# Patient Record
Sex: Female | Born: 1945
Health system: Southern US, Community
[De-identification: ages and names within clinical notes are randomized; demographics above are authoritative.]

## PROBLEM LIST (undated history)

## (undated) DIAGNOSIS — Z8619 Personal history of other infectious and parasitic diseases: Secondary | ICD-10-CM

## (undated) DIAGNOSIS — B373 Candidiasis of vulva and vagina: Secondary | ICD-10-CM

## (undated) DIAGNOSIS — T8859XA Other complications of anesthesia, initial encounter: Secondary | ICD-10-CM

## (undated) DIAGNOSIS — K219 Gastro-esophageal reflux disease without esophagitis: Secondary | ICD-10-CM

## (undated) DIAGNOSIS — R011 Cardiac murmur, unspecified: Secondary | ICD-10-CM

## (undated) DIAGNOSIS — I10 Essential (primary) hypertension: Secondary | ICD-10-CM

## (undated) DIAGNOSIS — C50919 Malignant neoplasm of unspecified site of unspecified female breast: Secondary | ICD-10-CM

## (undated) DIAGNOSIS — Z889 Allergy status to unspecified drugs, medicaments and biological substances status: Secondary | ICD-10-CM

## (undated) DIAGNOSIS — J189 Pneumonia, unspecified organism: Secondary | ICD-10-CM

## (undated) DIAGNOSIS — B379 Candidiasis, unspecified: Secondary | ICD-10-CM

## (undated) DIAGNOSIS — IMO0002 Reserved for concepts with insufficient information to code with codable children: Secondary | ICD-10-CM

## (undated) DIAGNOSIS — I499 Cardiac arrhythmia, unspecified: Secondary | ICD-10-CM

## (undated) DIAGNOSIS — Z923 Personal history of irradiation: Secondary | ICD-10-CM

## (undated) DIAGNOSIS — D071 Carcinoma in situ of vulva: Secondary | ICD-10-CM

## (undated) DIAGNOSIS — Z8739 Personal history of other diseases of the musculoskeletal system and connective tissue: Secondary | ICD-10-CM

## (undated) DIAGNOSIS — K5790 Diverticulosis of intestine, part unspecified, without perforation or abscess without bleeding: Secondary | ICD-10-CM

## (undated) DIAGNOSIS — T7840XA Allergy, unspecified, initial encounter: Secondary | ICD-10-CM

## (undated) DIAGNOSIS — N83209 Unspecified ovarian cyst, unspecified side: Secondary | ICD-10-CM

## (undated) DIAGNOSIS — H269 Unspecified cataract: Secondary | ICD-10-CM

## (undated) DIAGNOSIS — M1990 Unspecified osteoarthritis, unspecified site: Secondary | ICD-10-CM

## (undated) DIAGNOSIS — Z86010 Personal history of colonic polyps: Secondary | ICD-10-CM

## (undated) DIAGNOSIS — N952 Postmenopausal atrophic vaginitis: Secondary | ICD-10-CM

## (undated) HISTORY — DX: Postmenopausal atrophic vaginitis: N95.2

## (undated) HISTORY — DX: Candidiasis, unspecified: B37.9

## (undated) HISTORY — PX: HERNIA REPAIR: SHX51

## (undated) HISTORY — DX: Reserved for concepts with insufficient information to code with codable children: IMO0002

## (undated) HISTORY — PX: COLONOSCOPY W/ BIOPSIES: SHX1374

## (undated) HISTORY — DX: Personal history of other diseases of the musculoskeletal system and connective tissue: Z87.39

## (undated) HISTORY — PX: KNEE SURGERY: SHX244

## (undated) HISTORY — PX: BREAST EXCISIONAL BIOPSY: SUR124

## (undated) HISTORY — DX: Allergy, unspecified, initial encounter: T78.40XA

## (undated) HISTORY — DX: Unspecified cataract: H26.9

## (undated) HISTORY — DX: Unspecified ovarian cyst, unspecified side: N83.209

## (undated) HISTORY — PX: COLONOSCOPY: SHX174

## (undated) HISTORY — DX: Diverticulosis of intestine, part unspecified, without perforation or abscess without bleeding: K57.90

## (undated) HISTORY — DX: Candidiasis of vulva and vagina: B37.3

## (undated) HISTORY — DX: Personal history of other infectious and parasitic diseases: Z86.19

## (undated) HISTORY — PX: ABDOMINAL HYSTERECTOMY: SHX81

## (undated) HISTORY — PX: BREAST SURGERY: SHX581

## (undated) HISTORY — DX: Malignant neoplasm of unspecified site of unspecified female breast: C50.919

## (undated) HISTORY — PX: ABDOMINAL SURGERY: SHX537

## (undated) HISTORY — DX: Unspecified osteoarthritis, unspecified site: M19.90

## (undated) HISTORY — DX: Carcinoma in situ of vulva: D07.1

## (undated) HISTORY — DX: Personal history of colonic polyps: Z86.010

## (undated) HISTORY — DX: Pneumonia, unspecified organism: J18.9

## (undated) HISTORY — PX: EYE SURGERY: SHX253

---

## 1988-01-24 DIAGNOSIS — Z923 Personal history of irradiation: Secondary | ICD-10-CM

## 1988-01-24 HISTORY — DX: Personal history of irradiation: Z92.3

## 1988-01-24 HISTORY — PX: BREAST LUMPECTOMY: SHX2

## 1997-01-23 DIAGNOSIS — C50919 Malignant neoplasm of unspecified site of unspecified female breast: Secondary | ICD-10-CM

## 1997-01-23 HISTORY — DX: Malignant neoplasm of unspecified site of unspecified female breast: C50.919

## 2003-08-03 ENCOUNTER — Encounter: Admission: RE | Admit: 2003-08-03 | Discharge: 2003-08-03 | Payer: Self-pay | Admitting: Oncology

## 2004-01-21 ENCOUNTER — Ambulatory Visit: Payer: Self-pay | Admitting: Oncology

## 2004-01-24 DIAGNOSIS — R87619 Unspecified abnormal cytological findings in specimens from cervix uteri: Secondary | ICD-10-CM

## 2004-01-24 DIAGNOSIS — IMO0002 Reserved for concepts with insufficient information to code with codable children: Secondary | ICD-10-CM

## 2004-01-24 HISTORY — DX: Reserved for concepts with insufficient information to code with codable children: IMO0002

## 2004-01-24 HISTORY — DX: Unspecified abnormal cytological findings in specimens from cervix uteri: R87.619

## 2004-05-26 ENCOUNTER — Encounter: Admission: RE | Admit: 2004-05-26 | Discharge: 2004-05-26 | Payer: Self-pay

## 2004-06-06 ENCOUNTER — Encounter: Admission: RE | Admit: 2004-06-06 | Discharge: 2004-06-06 | Payer: Self-pay

## 2004-07-29 ENCOUNTER — Ambulatory Visit: Payer: Self-pay | Admitting: Oncology

## 2004-08-18 ENCOUNTER — Encounter: Admission: RE | Admit: 2004-08-18 | Discharge: 2004-11-04 | Payer: Self-pay | Admitting: Neurosurgery

## 2004-11-17 ENCOUNTER — Encounter: Admission: RE | Admit: 2004-11-17 | Discharge: 2004-11-17 | Payer: Self-pay | Admitting: Neurosurgery

## 2005-02-03 ENCOUNTER — Ambulatory Visit: Payer: Self-pay | Admitting: Oncology

## 2005-06-23 DIAGNOSIS — N952 Postmenopausal atrophic vaginitis: Secondary | ICD-10-CM

## 2005-06-23 HISTORY — DX: Postmenopausal atrophic vaginitis: N95.2

## 2005-07-05 ENCOUNTER — Other Ambulatory Visit: Admission: RE | Admit: 2005-07-05 | Discharge: 2005-07-05 | Payer: Self-pay | Admitting: Obstetrics and Gynecology

## 2005-07-21 ENCOUNTER — Ambulatory Visit: Payer: Self-pay | Admitting: Oncology

## 2005-07-31 LAB — COMPREHENSIVE METABOLIC PANEL
Alkaline Phosphatase: 86 U/L (ref 39–117)
BUN: 14 mg/dL (ref 6–23)
Glucose, Bld: 94 mg/dL (ref 70–99)
Sodium: 140 mEq/L (ref 135–145)
Total Bilirubin: 0.5 mg/dL (ref 0.3–1.2)

## 2005-07-31 LAB — CBC WITH DIFFERENTIAL/PLATELET
Basophils Absolute: 0 10*3/uL (ref 0.0–0.1)
Eosinophils Absolute: 0.2 10*3/uL (ref 0.0–0.5)
LYMPH%: 30.9 % (ref 14.0–48.0)
MCV: 86 fL (ref 81.0–101.0)
MONO%: 8.3 % (ref 0.0–13.0)
NEUT#: 2.4 10*3/uL (ref 1.5–6.5)
Platelets: 286 10*3/uL (ref 145–400)
RBC: 4.54 10*6/uL (ref 3.70–5.32)

## 2005-08-01 ENCOUNTER — Encounter: Admission: RE | Admit: 2005-08-01 | Discharge: 2005-08-01 | Payer: Self-pay | Admitting: Oncology

## 2005-08-08 ENCOUNTER — Encounter: Admission: RE | Admit: 2005-08-08 | Discharge: 2005-08-08 | Payer: Self-pay | Admitting: Neurosurgery

## 2006-01-18 ENCOUNTER — Ambulatory Visit: Payer: Self-pay | Admitting: Oncology

## 2006-01-24 LAB — COMPREHENSIVE METABOLIC PANEL
Albumin: 4.2 g/dL (ref 3.5–5.2)
Alkaline Phosphatase: 93 U/L (ref 39–117)
BUN: 15 mg/dL (ref 6–23)
CO2: 29 mEq/L (ref 19–32)
Calcium: 9.1 mg/dL (ref 8.4–10.5)
Chloride: 103 mEq/L (ref 96–112)
Glucose, Bld: 84 mg/dL (ref 70–99)
Potassium: 3.7 mEq/L (ref 3.5–5.3)

## 2006-01-24 LAB — CANCER ANTIGEN 27.29: CA 27.29: 25 U/mL (ref 0–39)

## 2006-01-24 LAB — CBC WITH DIFFERENTIAL/PLATELET
Basophils Absolute: 0.1 10*3/uL (ref 0.0–0.1)
Eosinophils Absolute: 0.2 10*3/uL (ref 0.0–0.5)
HGB: 13.6 g/dL (ref 11.6–15.9)
MCV: 83.9 fL (ref 81.0–101.0)
MONO#: 0.4 10*3/uL (ref 0.1–0.9)
MONO%: 9.8 % (ref 0.0–13.0)
NEUT#: 1.5 10*3/uL (ref 1.5–6.5)
RDW: 12.4 % (ref 11.3–14.5)

## 2006-01-24 LAB — LACTATE DEHYDROGENASE: LDH: 146 U/L (ref 94–250)

## 2006-11-12 ENCOUNTER — Encounter: Admission: RE | Admit: 2006-11-12 | Discharge: 2007-01-23 | Payer: Self-pay | Admitting: Family Medicine

## 2007-01-22 ENCOUNTER — Ambulatory Visit: Payer: Self-pay | Admitting: Oncology

## 2007-01-25 LAB — CBC WITH DIFFERENTIAL/PLATELET
BASO%: 0.7 % (ref 0.0–2.0)
Basophils Absolute: 0 10*3/uL (ref 0.0–0.1)
EOS%: 3 % (ref 0.0–7.0)
HCT: 39.8 % (ref 34.8–46.6)
HGB: 13.4 g/dL (ref 11.6–15.9)
MCH: 28.5 pg (ref 26.0–34.0)
MCHC: 33.8 g/dL (ref 32.0–36.0)
MONO#: 0.3 10*3/uL (ref 0.1–0.9)
NEUT%: 58.1 % (ref 39.6–76.8)
RDW: 14.9 % — ABNORMAL HIGH (ref 11.3–14.5)
WBC: 4.9 10*3/uL (ref 3.9–10.0)
lymph#: 1.6 10*3/uL (ref 0.9–3.3)

## 2007-01-25 LAB — COMPREHENSIVE METABOLIC PANEL
ALT: 22 U/L (ref 0–35)
AST: 22 U/L (ref 0–37)
Albumin: 4 g/dL (ref 3.5–5.2)
CO2: 23 mEq/L (ref 19–32)
Calcium: 9.3 mg/dL (ref 8.4–10.5)
Chloride: 103 mEq/L (ref 96–112)
Potassium: 3.8 mEq/L (ref 3.5–5.3)

## 2007-01-25 LAB — LACTATE DEHYDROGENASE: LDH: 160 U/L (ref 94–250)

## 2007-02-19 ENCOUNTER — Encounter: Admission: RE | Admit: 2007-02-19 | Discharge: 2007-02-19 | Payer: Self-pay | Admitting: Specialist

## 2007-02-25 ENCOUNTER — Encounter: Admission: RE | Admit: 2007-02-25 | Discharge: 2007-02-25 | Payer: Self-pay | Admitting: Neurosurgery

## 2007-06-30 ENCOUNTER — Emergency Department (HOSPITAL_COMMUNITY): Admission: EM | Admit: 2007-06-30 | Discharge: 2007-06-30 | Payer: Self-pay | Admitting: Emergency Medicine

## 2007-07-24 DIAGNOSIS — B373 Candidiasis of vulva and vagina: Secondary | ICD-10-CM

## 2007-07-24 DIAGNOSIS — B3731 Acute candidiasis of vulva and vagina: Secondary | ICD-10-CM

## 2007-07-24 HISTORY — DX: Acute candidiasis of vulva and vagina: B37.31

## 2007-07-24 HISTORY — DX: Candidiasis of vulva and vagina: B37.3

## 2007-08-02 ENCOUNTER — Emergency Department (HOSPITAL_COMMUNITY): Admission: EM | Admit: 2007-08-02 | Discharge: 2007-08-02 | Payer: Self-pay | Admitting: Emergency Medicine

## 2007-08-07 ENCOUNTER — Encounter: Admission: RE | Admit: 2007-08-07 | Discharge: 2007-08-07 | Payer: Self-pay | Admitting: Oncology

## 2008-01-21 ENCOUNTER — Ambulatory Visit: Payer: Self-pay | Admitting: Oncology

## 2008-01-23 LAB — CBC WITH DIFFERENTIAL/PLATELET
BASO%: 1 % (ref 0.0–2.0)
Basophils Absolute: 0 10*3/uL (ref 0.0–0.1)
EOS%: 5.1 % (ref 0.0–7.0)
Eosinophils Absolute: 0.2 10*3/uL (ref 0.0–0.5)
HCT: 40.2 % (ref 34.8–46.6)
HGB: 13.5 g/dL (ref 11.6–15.9)
LYMPH%: 37.3 % (ref 14.0–48.0)
MCH: 29 pg (ref 26.0–34.0)
MCHC: 33.5 g/dL (ref 32.0–36.0)
MCV: 86.4 fL (ref 81.0–101.0)
MONO#: 0.3 10*3/uL (ref 0.1–0.9)
MONO%: 7.3 % (ref 0.0–13.0)
NEUT#: 2.2 10*3/uL (ref 1.5–6.5)
NEUT%: 49.3 % (ref 39.6–76.8)
Platelets: 228 10*3/uL (ref 145–400)
RBC: 4.65 10*6/uL (ref 3.70–5.32)
RDW: 15.1 % — ABNORMAL HIGH (ref 11.3–14.5)
WBC: 4.4 10*3/uL (ref 3.9–10.0)
lymph#: 1.6 10*3/uL (ref 0.9–3.3)

## 2008-01-27 LAB — COMPREHENSIVE METABOLIC PANEL
ALT: 17 U/L (ref 0–35)
AST: 18 U/L (ref 0–37)
Albumin: 3.9 g/dL (ref 3.5–5.2)
Alkaline Phosphatase: 76 U/L (ref 39–117)
BUN: 12 mg/dL (ref 6–23)
CO2: 26 mEq/L (ref 19–32)
Calcium: 9 mg/dL (ref 8.4–10.5)
Chloride: 106 mEq/L (ref 96–112)
Creatinine, Ser: 0.76 mg/dL (ref 0.40–1.20)
Glucose, Bld: 91 mg/dL (ref 70–99)
Potassium: 3.7 mEq/L (ref 3.5–5.3)
Sodium: 143 mEq/L (ref 135–145)
Total Bilirubin: 0.4 mg/dL (ref 0.3–1.2)
Total Protein: 6.9 g/dL (ref 6.0–8.3)

## 2008-01-27 LAB — LACTATE DEHYDROGENASE: LDH: 162 U/L (ref 94–250)

## 2008-02-07 ENCOUNTER — Encounter (INDEPENDENT_AMBULATORY_CARE_PROVIDER_SITE_OTHER): Payer: Self-pay | Admitting: Orthopedic Surgery

## 2008-02-07 ENCOUNTER — Ambulatory Visit (HOSPITAL_BASED_OUTPATIENT_CLINIC_OR_DEPARTMENT_OTHER): Admission: RE | Admit: 2008-02-07 | Discharge: 2008-02-07 | Payer: Self-pay | Admitting: Orthopedic Surgery

## 2008-02-27 ENCOUNTER — Encounter: Admission: RE | Admit: 2008-02-27 | Discharge: 2008-02-27 | Payer: Self-pay | Admitting: Neurosurgery

## 2008-09-24 ENCOUNTER — Encounter: Admission: RE | Admit: 2008-09-24 | Discharge: 2008-09-24 | Payer: Self-pay | Admitting: Oncology

## 2009-01-03 ENCOUNTER — Emergency Department (HOSPITAL_COMMUNITY): Admission: EM | Admit: 2009-01-03 | Discharge: 2009-01-03 | Payer: Self-pay | Admitting: Emergency Medicine

## 2009-01-19 ENCOUNTER — Ambulatory Visit: Payer: Self-pay | Admitting: Oncology

## 2009-01-25 LAB — CBC WITH DIFFERENTIAL/PLATELET
BASO%: 0.6 % (ref 0.0–2.0)
Basophils Absolute: 0 10e3/uL (ref 0.0–0.1)
EOS%: 4 % (ref 0.0–7.0)
Eosinophils Absolute: 0.2 10e3/uL (ref 0.0–0.5)
HCT: 39.2 % (ref 34.8–46.6)
HGB: 13.2 g/dL (ref 11.6–15.9)
LYMPH%: 33.8 % (ref 14.0–49.7)
MCH: 28.6 pg (ref 25.1–34.0)
MCHC: 33.6 g/dL (ref 31.5–36.0)
MCV: 85.1 fL (ref 79.5–101.0)
MONO#: 0.4 10e3/uL (ref 0.1–0.9)
MONO%: 8.1 % (ref 0.0–14.0)
NEUT#: 2.8 10e3/uL (ref 1.5–6.5)
NEUT%: 53.5 % (ref 38.4–76.8)
Platelets: 313 10e3/uL (ref 145–400)
RBC: 4.61 10e6/uL (ref 3.70–5.45)
RDW: 14.8 % — ABNORMAL HIGH (ref 11.2–14.5)
WBC: 5.2 10e3/uL (ref 3.9–10.3)
lymph#: 1.8 10e3/uL (ref 0.9–3.3)

## 2009-01-25 LAB — COMPREHENSIVE METABOLIC PANEL WITH GFR
ALT: 19 U/L (ref 0–35)
AST: 20 U/L (ref 0–37)
Albumin: 4.1 g/dL (ref 3.5–5.2)
Alkaline Phosphatase: 78 U/L (ref 39–117)
BUN: 13 mg/dL (ref 6–23)
CO2: 25 meq/L (ref 19–32)
Calcium: 8.8 mg/dL (ref 8.4–10.5)
Chloride: 102 meq/L (ref 96–112)
Creatinine, Ser: 0.69 mg/dL (ref 0.40–1.20)
Glucose, Bld: 86 mg/dL (ref 70–99)
Potassium: 3.3 meq/L — ABNORMAL LOW (ref 3.5–5.3)
Sodium: 139 meq/L (ref 135–145)
Total Bilirubin: 0.3 mg/dL (ref 0.3–1.2)
Total Protein: 6.9 g/dL (ref 6.0–8.3)

## 2009-01-25 LAB — VITAMIN D 25 HYDROXY (VIT D DEFICIENCY, FRACTURES): Vit D, 25-Hydroxy: 48 ng/mL (ref 30–89)

## 2009-01-25 LAB — LACTATE DEHYDROGENASE: LDH: 168 U/L (ref 94–250)

## 2009-03-03 ENCOUNTER — Encounter: Admission: RE | Admit: 2009-03-03 | Discharge: 2009-03-03 | Payer: Self-pay | Admitting: Neurosurgery

## 2009-03-23 DIAGNOSIS — D071 Carcinoma in situ of vulva: Secondary | ICD-10-CM

## 2009-03-23 HISTORY — DX: Carcinoma in situ of vulva: D07.1

## 2009-04-01 ENCOUNTER — Ambulatory Visit (HOSPITAL_COMMUNITY): Admission: RE | Admit: 2009-04-01 | Discharge: 2009-04-01 | Payer: Self-pay | Admitting: Obstetrics and Gynecology

## 2009-04-12 DIAGNOSIS — D071 Carcinoma in situ of vulva: Secondary | ICD-10-CM | POA: Insufficient documentation

## 2009-09-28 ENCOUNTER — Encounter: Admission: RE | Admit: 2009-09-28 | Discharge: 2009-09-28 | Payer: Self-pay | Admitting: Oncology

## 2010-05-09 LAB — BASIC METABOLIC PANEL
BUN: 11 mg/dL (ref 6–23)
CO2: 26 mEq/L (ref 19–32)
Calcium: 9.1 mg/dL (ref 8.4–10.5)
Chloride: 103 mEq/L (ref 96–112)
Creatinine, Ser: 0.69 mg/dL (ref 0.4–1.2)
GFR calc Af Amer: >60 mL/min (ref >60)
GFR calc non Af Amer: >60 mL/min (ref >60)
Glucose, Bld: 101 mg/dL — ABNORMAL HIGH (ref 70–99)
Potassium: 3.6 mEq/L (ref 3.5–5.1)
Sodium: 139 mEq/L (ref 135–145)

## 2010-06-07 NOTE — Op Note (Signed)
NAME:  Bridget Cortez, PECKMAN NO.:  [id number]   MEDICAL RECORD NO.:  [id number]          PATIENT TYPE:  AMB   LOCATION:  DSC                          FACILITY:  [facility]   PHYSICIAN:  Eulas Post, MD    DATE OF BIRTH:  08-14-1945   DATE OF PROCEDURE:  02/07/2008  DATE OF DISCHARGE:                               OPERATIVE REPORT   ATTENDING PHYSICIAN:  Eulas Post, MD   FIRST ASSISTANT:  Skip Mayer, [name]   PREOPERATIVE DIAGNOSIS:  Left foot dorsal ganglion.   POSTOPERATIVE DIAGNOSIS:  Left foot dorsal ganglion.   OPERATIVE PROCEDURE:  Left foot excision of dorsal ganglion.   OPERATIVE FINDINGS:  The ganglion cyst appeared to connect down to the  tendon sheath of the extensor tendons.  It did not appear to go deeper  down into the joint.   OPERATIVE PROCEDURE:  The patient was brought to the operating room and  placed in supine position.  Initially, we tried regional anesthesia, but  she was unable to tolerate this.  She was extremely anxious.  She  subsequently underwent general anesthesia.  The left lower extremity was  prepped and draped in the usual sterile fashion.  Calf tourniquet was  applied.  A 300 mmHg was utilized.  A total of 15 minutes of tourniquet  time was utilized.  After sterile prep and drape, incision was made over  the dorsal ganglion.  The cyst was excised completely down to the tendon  sheath.  There was clear, watery fluid inside the cyst.  This was  somewhat different than the typical joint ganglion, and the fluid did  not appear to be similar to joint fluid, but rather more like synovial  fluid from the tendon sheath.  After copious irrigation and complete  excision, the skin was closed with nylon.  The patient was placed in a  sterile dressing in a postop shoe.  She returned to the PACU in stable  and satisfactory condition.  There were no complications.  The patient  tolerated the procedure well.      Eulas Post, MD  Electronically Signed     JPL/MEDQ  D:  02/07/2008  T:  02/08/2008  Job:  [id number]

## 2010-08-04 ENCOUNTER — Other Ambulatory Visit: Payer: Self-pay | Admitting: Dermatology

## 2010-08-09 ENCOUNTER — Other Ambulatory Visit: Payer: Self-pay | Admitting: Family Medicine

## 2010-08-09 DIAGNOSIS — Z1231 Encounter for screening mammogram for malignant neoplasm of breast: Secondary | ICD-10-CM

## 2010-09-01 ENCOUNTER — Other Ambulatory Visit: Payer: Self-pay | Admitting: Neurosurgery

## 2010-09-01 DIAGNOSIS — M542 Cervicalgia: Secondary | ICD-10-CM

## 2010-09-08 ENCOUNTER — Ambulatory Visit
Admission: RE | Admit: 2010-09-08 | Discharge: 2010-09-08 | Disposition: A | Discharge status: Home / Self Care | Payer: Medicare Other | Source: Ambulatory Visit | Attending: Neurosurgery | Admitting: Neurosurgery

## 2010-09-08 DIAGNOSIS — M542 Cervicalgia: Secondary | ICD-10-CM

## 2010-09-30 ENCOUNTER — Ambulatory Visit
Admission: RE | Admit: 2010-09-30 | Discharge: 2010-09-30 | Disposition: A | Discharge status: Home / Self Care | Payer: Medicare Other | Source: Ambulatory Visit | Attending: Family Medicine | Admitting: Family Medicine

## 2010-09-30 DIAGNOSIS — Z1231 Encounter for screening mammogram for malignant neoplasm of breast: Secondary | ICD-10-CM

## 2010-10-20 LAB — URINALYSIS, ROUTINE W REFLEX MICROSCOPIC
Bilirubin Urine: NEGATIVE
Glucose, UA: NEGATIVE
Hgb urine dipstick: NEGATIVE
Ketones, ur: NEGATIVE
Nitrite: NEGATIVE
Protein, ur: NEGATIVE
Specific Gravity, Urine: 1.008
Urobilinogen, UA: 0.2
pH: 7.5

## 2010-10-20 LAB — URINE MICROSCOPIC-ADD ON

## 2011-01-26 DIAGNOSIS — J45909 Unspecified asthma, uncomplicated: Secondary | ICD-10-CM | POA: Diagnosis not present

## 2011-01-26 DIAGNOSIS — H00019 Hordeolum externum unspecified eye, unspecified eyelid: Secondary | ICD-10-CM | POA: Diagnosis not present

## 2011-01-26 DIAGNOSIS — G935 Compression of brain: Secondary | ICD-10-CM | POA: Diagnosis not present

## 2011-01-26 DIAGNOSIS — R05 Cough: Secondary | ICD-10-CM | POA: Diagnosis not present

## 2011-02-27 DIAGNOSIS — D6489 Other specified anemias: Secondary | ICD-10-CM | POA: Diagnosis not present

## 2011-02-27 DIAGNOSIS — I10 Essential (primary) hypertension: Secondary | ICD-10-CM | POA: Diagnosis not present

## 2011-02-28 DIAGNOSIS — D6489 Other specified anemias: Secondary | ICD-10-CM | POA: Diagnosis not present

## 2011-02-28 DIAGNOSIS — I10 Essential (primary) hypertension: Secondary | ICD-10-CM | POA: Diagnosis not present

## 2011-03-06 DIAGNOSIS — I10 Essential (primary) hypertension: Secondary | ICD-10-CM | POA: Diagnosis not present

## 2011-03-06 DIAGNOSIS — B370 Candidal stomatitis: Secondary | ICD-10-CM | POA: Diagnosis not present

## 2011-03-09 DIAGNOSIS — J45909 Unspecified asthma, uncomplicated: Secondary | ICD-10-CM | POA: Diagnosis not present

## 2011-03-09 DIAGNOSIS — Q054 Unspecified spina bifida with hydrocephalus: Secondary | ICD-10-CM | POA: Diagnosis not present

## 2011-03-09 DIAGNOSIS — B3781 Candidal esophagitis: Secondary | ICD-10-CM | POA: Diagnosis not present

## 2011-03-09 DIAGNOSIS — M1990 Unspecified osteoarthritis, unspecified site: Secondary | ICD-10-CM | POA: Diagnosis not present

## 2011-04-05 DIAGNOSIS — J45909 Unspecified asthma, uncomplicated: Secondary | ICD-10-CM | POA: Diagnosis not present

## 2011-04-05 DIAGNOSIS — B3781 Candidal esophagitis: Secondary | ICD-10-CM | POA: Diagnosis not present

## 2011-04-05 DIAGNOSIS — I10 Essential (primary) hypertension: Secondary | ICD-10-CM | POA: Diagnosis not present

## 2011-04-05 DIAGNOSIS — J309 Allergic rhinitis, unspecified: Secondary | ICD-10-CM | POA: Diagnosis not present

## 2011-04-11 ENCOUNTER — Emergency Department (HOSPITAL_COMMUNITY)
Admission: EM | Admit: 2011-04-11 | Discharge: 2011-04-11 | Disposition: A | Discharge status: Home / Self Care | Payer: Medicare Other | Attending: Emergency Medicine | Admitting: Emergency Medicine

## 2011-04-11 ENCOUNTER — Emergency Department (HOSPITAL_COMMUNITY): Payer: Medicare Other

## 2011-04-11 ENCOUNTER — Encounter (HOSPITAL_COMMUNITY): Payer: Self-pay | Admitting: *Deleted

## 2011-04-11 DIAGNOSIS — R1084 Generalized abdominal pain: Secondary | ICD-10-CM | POA: Diagnosis not present

## 2011-04-11 DIAGNOSIS — Z79899 Other long term (current) drug therapy: Secondary | ICD-10-CM | POA: Insufficient documentation

## 2011-04-11 DIAGNOSIS — I10 Essential (primary) hypertension: Secondary | ICD-10-CM | POA: Insufficient documentation

## 2011-04-11 DIAGNOSIS — E669 Obesity, unspecified: Secondary | ICD-10-CM | POA: Insufficient documentation

## 2011-04-11 DIAGNOSIS — M545 Low back pain, unspecified: Secondary | ICD-10-CM | POA: Diagnosis not present

## 2011-04-11 DIAGNOSIS — K5730 Diverticulosis of large intestine without perforation or abscess without bleeding: Secondary | ICD-10-CM | POA: Diagnosis not present

## 2011-04-11 DIAGNOSIS — K5900 Constipation, unspecified: Secondary | ICD-10-CM | POA: Diagnosis not present

## 2011-04-11 DIAGNOSIS — R109 Unspecified abdominal pain: Secondary | ICD-10-CM | POA: Insufficient documentation

## 2011-04-11 DIAGNOSIS — J45909 Unspecified asthma, uncomplicated: Secondary | ICD-10-CM | POA: Diagnosis not present

## 2011-04-11 HISTORY — DX: Essential (primary) hypertension: I10

## 2011-04-11 HISTORY — DX: Cardiac murmur, unspecified: R01.1

## 2011-04-11 HISTORY — DX: Reserved for concepts with insufficient information to code with codable children: IMO0002

## 2011-04-11 HISTORY — DX: Allergy status to unspecified drugs, medicaments and biological substances: Z88.9

## 2011-04-11 LAB — COMPREHENSIVE METABOLIC PANEL
ALT: 18 U/L (ref 0–35)
Alkaline Phosphatase: 80 U/L (ref 39–117)
BUN: 13 mg/dL (ref 6–23)
CO2: 25 mEq/L (ref 19–32)
Chloride: 102 mEq/L (ref 96–112)
GFR calc Af Amer: >90 mL/min (ref >90)
GFR calc non Af Amer: >90 mL/min (ref >90)
Glucose, Bld: 111 mg/dL — ABNORMAL HIGH (ref 70–99)
Potassium: 3.4 mEq/L — ABNORMAL LOW (ref 3.5–5.1)
Sodium: 138 mEq/L (ref 135–145)
Total Bilirubin: 0.3 mg/dL (ref 0.3–1.2)
Total Protein: 7.2 g/dL (ref 6.0–8.3)

## 2011-04-11 LAB — URINALYSIS, ROUTINE W REFLEX MICROSCOPIC
Bilirubin Urine: NEGATIVE
Glucose, UA: NEGATIVE mg/dL
Hgb urine dipstick: NEGATIVE
Ketones, ur: NEGATIVE mg/dL
Nitrite: NEGATIVE
Protein, ur: NEGATIVE mg/dL
Specific Gravity, Urine: 1.014 (ref 1.005–1.030)
Urobilinogen, UA: 0.2 mg/dL (ref 0.0–1.0)
pH: 6.5 (ref 5.0–8.0)

## 2011-04-11 LAB — LIPASE, BLOOD: Lipase: 18 U/L (ref 11–59)

## 2011-04-11 LAB — CBC
HCT: 40 % (ref 36.0–46.0)
Hemoglobin: 13.9 g/dL (ref 12.0–15.0)
MCHC: 34.8 g/dL (ref 30.0–36.0)
RBC: 4.77 MIL/uL (ref 3.87–5.11)

## 2011-04-11 LAB — URINE MICROSCOPIC-ADD ON

## 2011-04-11 MED ORDER — IOHEXOL 300 MG/ML  SOLN
100.0000 mL | Freq: Once | INTRAMUSCULAR | Status: AC | PRN
  Administered 2011-04-11: 100 mL via INTRAVENOUS

## 2011-04-11 MED ORDER — ONDANSETRON HCL 4 MG/2ML IJ SOLN
4.0000 mg | Freq: Once | INTRAMUSCULAR | Status: AC
  Administered 2011-04-11: 4 mg via INTRAVENOUS
  Filled 2011-04-11: qty 2

## 2011-04-11 MED ORDER — FENTANYL CITRATE 0.05 MG/ML IJ SOLN
25.0000 ug | Freq: Once | INTRAMUSCULAR | Status: AC
  Administered 2011-04-11: 100 ug via INTRAVENOUS
  Filled 2011-04-11: qty 2

## 2011-04-11 MED ORDER — POLYETHYLENE GLYCOL 3350 17 GM/SCOOP PO POWD: 17.0000 g | Freq: Every day | ORAL | Status: AC

## 2011-04-11 MED ORDER — SODIUM CHLORIDE 0.9 % IV SOLN
INTRAVENOUS | Status: DC
  Administered 2011-04-11 (×2): via INTRAVENOUS

## 2011-04-11 NOTE — ED Notes (Signed)
Pt reports progressing weakness: difficulty walking, sitting and standing. pain with movement only. Feels pain in low back and abd/pelvis when sitting or standing. Legs are weak. Pt of Drs Phoebe Perch (nsurg), Daleen Bo (PCP) & Janey Greaser ([place] [facility]).

## 2011-04-11 NOTE — ED Notes (Addendum)
C/o abd pain, also back pain. onset Sunday morning, also mentions "feel bloated", urinary incontinance, weakness, "burping ,but no flatus", last BM Monday morning (normal), (denies: nvd, fever, bleeding or vaginal sx).  Here with husband.

## 2011-04-11 NOTE — ED Provider Notes (Addendum)
History     CSN: [id number]  Arrival date & time 04/11/11  0440   First MD Initiated Contact with Patient 04/11/11 [phone]      Chief Complaint  Patient presents with  . Abdominal Pain    (Consider location/radiation/quality/duration/timing/severity/associated sxs/prior treatment) Patient is a 66 y.o. female presenting with abdominal pain. The history is provided by the patient and the spouse.  Abdominal Pain The primary symptoms of the illness include abdominal pain. The primary symptoms of the illness do not include fever, shortness of breath or dysuria. The current episode started 6 to 12 hours ago. The onset of the illness was gradual. The problem has not changed since onset. The patient has not had a change in bowel habit. Risk factors for an acute abdominal problem include a history of abdominal surgery. Additional symptoms associated with the illness include constipation and back pain. Symptoms associated with the illness do not include chills.   at home tonight and developed sharp lower abdominal pains radiate to her lower back. Back pain worse with movement. Unable to have a bowel movement or passed gas for the last 48 hours. Has history of multiple abdominal surgeries but denies history of small bowel obstruction. Moderate in severity. No associated nausea vomiting. No fevers or chills. No chest pain or difficulty breathing. Patient denies history of similar pain in the past.   Past Medical History  Diagnosis Date  . Asthma   . Hypertension   . Multiple allergies   . Chiari malformation   . Cancer   . Heart murmur     Past Surgical History  Procedure Date  . Breast surgery   . Hernia repair   . Abdominal surgery   . Knee surgery     right  . Abdominal hysterectomy     Family History  Problem Relation Age of Onset  . Cancer Mother   . Heart failure Other     History  Substance Use Topics  . Smoking status: Former Smoker    Quit date: 04/11/1991  . Smokeless  tobacco: Not on file  . Alcohol Use: No    OB History    Grav Para Term Preterm Abortions TAB SAB Ect Mult Living                  Review of Systems  Constitutional: Negative for fever and chills.  HENT: Negative for neck pain and neck stiffness.   Eyes: Negative for pain.  Respiratory: Negative for shortness of breath.   Cardiovascular: Negative for chest pain.  Gastrointestinal: Positive for abdominal pain and constipation.  Genitourinary: Negative for dysuria.  Musculoskeletal: Positive for back pain.  Skin: Negative for rash.  Neurological: Negative for headaches.  All other systems reviewed and are negative.    Allergies  Review of patient's allergies indicates no known allergies.  Home Medications   Current Outpatient Rx  Name Route Sig Dispense Refill  . ALBUTEROL SULFATE (2.5 MG/3ML) 0.083% IN NEBU Nebulization Take 2.5 mg by nebulization every 6 (six) hours as needed. For shortness of breath    . AMLODIPINE BESYLATE 10 MG PO TABS Oral Take 10 mg by mouth daily.    Marland Kitchen CALTRATE 600+D PO Oral Take 1 tablet by mouth daily.    . CELECOXIB 200 MG PO CAPS Oral Take 200 mg by mouth daily as needed. For arthritis pain    . CETIRIZINE HCL 10 MG PO TABS Oral Take 10 mg by mouth daily.    Marland Kitchen ESOMEPRAZOLE MAGNESIUM  40 MG PO CPDR Oral Take 40 mg by mouth daily before breakfast.    . FLUTICASONE-SALMETEROL 250-50 MCG/DOSE IN AEPB Inhalation Inhale 1 puff into the lungs every 12 (twelve) hours.    Marland Kitchen LOSARTAN POTASSIUM 50 MG PO TABS Oral Take 50 mg by mouth daily.    . ADULT MULTIVITAMIN W/MINERALS CH Oral Take 1 tablet by mouth every morning.    Marland Kitchen POTASSIUM CHLORIDE CRYS ER 20 MEQ PO TBCR Oral Take 60 mEq by mouth daily.    Marland Kitchen SIMVASTATIN 20 MG PO TABS Oral Take 20 mg by mouth every evening.    . TRIAMTERENE-HCTZ 75-50 MG PO TABS Oral Take 1 tablet by mouth daily.      BP 129/62  Pulse 70  Temp(Src) 97.8 F (36.6 C) (Oral)  Resp 17  SpO2 99%  Physical Exam  Constitutional:  She is oriented to person, place, and time. She appears well-developed and well-nourished.  HENT:  Head: Normocephalic and atraumatic.  Eyes: Conjunctivae and EOM are normal. Pupils are equal, round, and reactive to light.  Neck: Trachea normal. Neck supple. No thyromegaly present.  Cardiovascular: Normal rate, regular rhythm, S1 normal, S2 normal and normal pulses.     No systolic murmur is present   No diastolic murmur is present  Pulses:      Radial pulses are 2+ on the right side, and 2+ on the left side.  Pulmonary/Chest: Effort normal and breath sounds normal. She has no wheezes. She has no rhonchi. She has no rales. She exhibits no tenderness.  Abdominal: Soft. Normal appearance. She exhibits no mass. There is no rebound, no guarding, no CVA tenderness and negative Murphy's sign.       Obese. Mild diffuse tenderness, localizes discomfort to bilateral lower abdomen without peritonitis. Decreased bowel sounds throughout  Musculoskeletal:       BLE:s Calves nontender, no cords or erythema, negative Homans sign  No midline lumbar tenderness or deformity and no CVA tenderness  Neurological: She is alert and oriented to person, place, and time. She has normal strength. No cranial nerve deficit or sensory deficit. GCS eye subscore is 4. GCS verbal subscore is 5. GCS motor subscore is 6.  Skin: Skin is warm and dry. No rash noted. She is not diaphoretic.  Psychiatric: Her speech is normal.       Cooperative and appropriate    ED Course  Procedures (including critical care time)  Results for orders placed during the hospital encounter of 04/11/11  CBC      Component Value Range   WBC 7.4  4.0 - 10.5 (K/uL)   RBC 4.77  3.87 - 5.11 (MIL/uL)   Hemoglobin 13.9  12.0 - 15.0 (g/dL)   HCT 40.9  81.1 - 91.4 (%)   MCV 83.9  78.0 - 100.0 (fL)   MCH 29.1  26.0 - 34.0 (pg)   MCHC 34.8  30.0 - 36.0 (g/dL)   RDW 78.2  95.6 - 21.3 (%)   Platelets 275  150 - 400 (K/uL)  COMPREHENSIVE METABOLIC  PANEL      Component Value Range   Sodium 138  135 - 145 (mEq/L)   Potassium 3.4 (*) 3.5 - 5.1 (mEq/L)   Chloride 102  96 - 112 (mEq/L)   CO2 25  19 - 32 (mEq/L)   Glucose, Bld 111 (*) 70 - 99 (mg/dL)   BUN 13  6 - 23 (mg/dL)   Creatinine, Ser 0.86  0.50 - 1.10 (mg/dL)   Calcium 9.9  8.4 - 10.5 (mg/dL)   Total Protein 7.2  6.0 - 8.3 (g/dL)   Albumin 3.8  3.5 - 5.2 (g/dL)   AST 26  0 - 37 (U/L)   ALT 18  0 - 35 (U/L)   Alkaline Phosphatase 80  39 - 117 (U/L)   Total Bilirubin 0.3  0.3 - 1.2 (mg/dL)   GFR calc non Af Amer >90  >90 (mL/min)   GFR calc Af Amer >90  >90 (mL/min)  LIPASE, BLOOD      Component Value Range   Lipase 18  11 - 59 (U/L)  URINALYSIS, ROUTINE W REFLEX MICROSCOPIC      Component Value Range   Color, Urine YELLOW  YELLOW    APPearance CLEAR  CLEAR    Specific Gravity, Urine 1.014  1.005 - 1.030    pH 6.5  5.0 - 8.0    Glucose, UA NEGATIVE  NEGATIVE (mg/dL)   Hgb urine dipstick NEGATIVE  NEGATIVE    Bilirubin Urine NEGATIVE  NEGATIVE    Ketones, ur NEGATIVE  NEGATIVE (mg/dL)   Protein, ur NEGATIVE  NEGATIVE (mg/dL)   Urobilinogen, UA 0.2  0.0 - 1.0 (mg/dL)   Nitrite NEGATIVE  NEGATIVE    Leukocytes, UA TRACE (*) NEGATIVE   URINE MICROSCOPIC-ADD ON      Component Value Range   Squamous Epithelial / LPF FEW (*) RARE    WBC, UA 3-6  <3 (WBC/hpf)   RBC / HPF 0-2  <3 (RBC/hpf)   Bacteria, UA RARE  RARE      MDM   Lower abdominal pain with constipation and obstipation evaluated with labs and a CT scan.    Recheck at 0805 ambulates to the bathroom, improving condition. CT and lab results reviewed.   Plan discharge home with ABd pain and constipation precautions. miralax and close primary care follow up.    Sunnie Nielsen, MD 04/11/11 6045  Sunnie Nielsen, MD 04/11/11 [phone]

## 2011-04-11 NOTE — ED Notes (Signed)
Patient has finished drinking oral contrast.  CT called and notified.

## 2011-04-11 NOTE — Discharge Instructions (Signed)

## 2011-04-11 NOTE — ED Notes (Signed)
Patient drinking oral CT contrast.

## 2011-05-23 DIAGNOSIS — H179 Unspecified corneal scar and opacity: Secondary | ICD-10-CM | POA: Diagnosis not present

## 2011-05-23 DIAGNOSIS — H01009 Unspecified blepharitis unspecified eye, unspecified eyelid: Secondary | ICD-10-CM | POA: Diagnosis not present

## 2011-05-23 DIAGNOSIS — H2510 Age-related nuclear cataract, unspecified eye: Secondary | ICD-10-CM | POA: Diagnosis not present

## 2011-05-24 DIAGNOSIS — B3781 Candidal esophagitis: Secondary | ICD-10-CM | POA: Diagnosis not present

## 2011-05-24 DIAGNOSIS — J45909 Unspecified asthma, uncomplicated: Secondary | ICD-10-CM | POA: Diagnosis not present

## 2011-05-24 DIAGNOSIS — I10 Essential (primary) hypertension: Secondary | ICD-10-CM | POA: Diagnosis not present

## 2011-05-24 DIAGNOSIS — J309 Allergic rhinitis, unspecified: Secondary | ICD-10-CM | POA: Diagnosis not present

## 2011-06-12 ENCOUNTER — Telehealth: Payer: Self-pay | Admitting: Obstetrics and Gynecology

## 2011-06-12 NOTE — Telephone Encounter (Signed)
[facility] pt 

## 2011-06-12 NOTE — Telephone Encounter (Signed)
Routed to triage 

## 2011-06-13 ENCOUNTER — Ambulatory Visit (INDEPENDENT_AMBULATORY_CARE_PROVIDER_SITE_OTHER): Payer: Medicare Other | Admitting: Obstetrics and Gynecology

## 2011-06-13 ENCOUNTER — Encounter: Payer: Self-pay | Admitting: Obstetrics and Gynecology

## 2011-06-13 VITALS — BP 132/72 | Ht 69.0 in | Wt 263.0 lb

## 2011-06-13 DIAGNOSIS — N9089 Other specified noninflammatory disorders of vulva and perineum: Secondary | ICD-10-CM

## 2011-06-13 DIAGNOSIS — D071 Carcinoma in situ of vulva: Secondary | ICD-10-CM | POA: Diagnosis not present

## 2011-06-13 NOTE — Telephone Encounter (Signed)
Tc to pt per telephone call. Appt sched today @11 :30 with [facility] for eval. Pt voices understanding.

## 2011-06-13 NOTE — Progress Notes (Signed)
Subjective:this patient who has a history of vulvar intraepithelial neoplasia treated in 2011 by wide local excision presents complaining of "'soreness" just outside the vagina that began last Thursday. She scrubbed the area with a face cloth and noted a smear of old blood.  She also noticed a small smear of blood in her underpants. She continued to scrub the area but has seen no further bleeding  Objective:  Examination of the external genitalia reveals no open lesions and no recurrent lesions consistent with vulvar intraepithelial neoplasia. The vagina is atrophic with a tiny area of denuded mucosa near the urethra.  Impression:  History of vulvar intraepithelial neoplasia III STATUS post wide local excision with no evidence of recurrent   Vaginal atrophy  Recommendation: the patient should avoid abrasive scrubbing in the vaginal area and use Vaseline for areas of soreness    She should followup for her routine annual evaluation in November of 2013    She should call if she has any significant vaginal bleeding

## 2011-06-14 ENCOUNTER — Telehealth: Payer: Self-pay | Admitting: Obstetrics and Gynecology

## 2011-06-14 ENCOUNTER — Encounter: Payer: Self-pay | Admitting: Obstetrics and Gynecology

## 2011-06-14 ENCOUNTER — Ambulatory Visit (INDEPENDENT_AMBULATORY_CARE_PROVIDER_SITE_OTHER): Payer: Medicare Other | Admitting: Obstetrics and Gynecology

## 2011-06-14 VITALS — BP 122/80 | HR 84 | Wt 261.0 lb

## 2011-06-14 DIAGNOSIS — R6889 Other general symptoms and signs: Secondary | ICD-10-CM

## 2011-06-14 DIAGNOSIS — N952 Postmenopausal atrophic vaginitis: Secondary | ICD-10-CM

## 2011-06-14 DIAGNOSIS — R011 Cardiac murmur, unspecified: Secondary | ICD-10-CM | POA: Diagnosis not present

## 2011-06-14 DIAGNOSIS — L02219 Cutaneous abscess of trunk, unspecified: Secondary | ICD-10-CM

## 2011-06-14 DIAGNOSIS — Z8739 Personal history of other diseases of the musculoskeletal system and connective tissue: Secondary | ICD-10-CM

## 2011-06-14 DIAGNOSIS — Q054 Unspecified spina bifida with hydrocephalus: Secondary | ICD-10-CM | POA: Diagnosis not present

## 2011-06-14 DIAGNOSIS — Z8619 Personal history of other infectious and parasitic diseases: Secondary | ICD-10-CM

## 2011-06-14 DIAGNOSIS — N83209 Unspecified ovarian cyst, unspecified side: Secondary | ICD-10-CM

## 2011-06-14 DIAGNOSIS — IMO0002 Reserved for concepts with insufficient information to code with codable children: Secondary | ICD-10-CM | POA: Insufficient documentation

## 2011-06-14 DIAGNOSIS — L02214 Cutaneous abscess of groin: Secondary | ICD-10-CM

## 2011-06-14 DIAGNOSIS — B373 Candidiasis of vulva and vagina: Secondary | ICD-10-CM

## 2011-06-14 DIAGNOSIS — B379 Candidiasis, unspecified: Secondary | ICD-10-CM | POA: Insufficient documentation

## 2011-06-14 DIAGNOSIS — B49 Unspecified mycosis: Secondary | ICD-10-CM

## 2011-06-14 DIAGNOSIS — C801 Malignant (primary) neoplasm, unspecified: Secondary | ICD-10-CM

## 2011-06-14 DIAGNOSIS — D071 Carcinoma in situ of vulva: Secondary | ICD-10-CM

## 2011-06-14 MED ORDER — CEPHALEXIN 500 MG PO CAPS: 500.0000 mg | ORAL_CAPSULE | Freq: Four times a day (QID) | ORAL | Status: AC

## 2011-06-14 NOTE — Progress Notes (Signed)
65 YO complains of a tender knot on vaginal area x 1 week. Questions if there was drainage because she initially saw a pink liquid when she bathed that she thought was coming from her vagina.  She had vaginal bleeding ruled out and then discovered this area.  O: Left mons/groin  with tender erythematous indurated nodule without pointing; no adenopathy  A: Left Mons/Groin Abscess  P: Warm compresses to left mons/groin x 15 min. x      4 daily       Cephalexin 500 mg # 28 1 po qid x 7 days       RTO- 1 week for possible I & D  Rajean Desantiago, [name]

## 2011-06-14 NOTE — Patient Instructions (Signed)
Apply warm compresses to affected area for 15 minutes 4 times a day  Take all of antibiotics until gone

## 2011-06-14 NOTE — Telephone Encounter (Signed)
Bridget Cortez/[facility] pt/was seen yesterday

## 2011-06-14 NOTE — Telephone Encounter (Signed)
Tc to pt per telephone call. Pt noticed a raised area on vulvar this am accompanied by soreness. No drainage from area. No fever. Consulted with [facility], pt to be seen today for eval. Appt sched today @11 :15 with ep. Pt voices understanding.

## 2011-06-15 DIAGNOSIS — H2510 Age-related nuclear cataract, unspecified eye: Secondary | ICD-10-CM | POA: Diagnosis not present

## 2011-06-15 DIAGNOSIS — H40029 Open angle with borderline findings, high risk, unspecified eye: Secondary | ICD-10-CM | POA: Diagnosis not present

## 2011-06-15 DIAGNOSIS — H01009 Unspecified blepharitis unspecified eye, unspecified eyelid: Secondary | ICD-10-CM | POA: Diagnosis not present

## 2011-06-15 DIAGNOSIS — H179 Unspecified corneal scar and opacity: Secondary | ICD-10-CM | POA: Diagnosis not present

## 2011-06-20 ENCOUNTER — Telehealth: Payer: Self-pay | Admitting: Obstetrics and Gynecology

## 2011-06-20 NOTE — Telephone Encounter (Signed)
TC from pt.  States is using hot water bottle on raised area as recommeneded by EP.  Had slight drainage 06/18/11.   Now has 2 more raised areas.   Is applying heat there as well.  Questioning if should try to squeeze to open.   Advised not to do so.  To continue with heat and taking antibiotic.  Keep appt 06/22/11. Pt verbalizes comprehension.

## 2011-06-20 NOTE — Telephone Encounter (Signed)
Triage/epic 

## 2011-06-22 ENCOUNTER — Encounter: Payer: Self-pay | Admitting: Obstetrics and Gynecology

## 2011-06-22 ENCOUNTER — Ambulatory Visit (INDEPENDENT_AMBULATORY_CARE_PROVIDER_SITE_OTHER): Payer: Medicare Other | Admitting: Obstetrics and Gynecology

## 2011-06-22 ENCOUNTER — Other Ambulatory Visit: Payer: Self-pay | Admitting: Obstetrics and Gynecology

## 2011-06-22 VITALS — BP 110/80 | Resp 14 | Wt 263.0 lb

## 2011-06-22 DIAGNOSIS — L0291 Cutaneous abscess, unspecified: Secondary | ICD-10-CM | POA: Diagnosis not present

## 2011-06-22 DIAGNOSIS — L0390 Cellulitis, unspecified: Secondary | ICD-10-CM

## 2011-06-22 NOTE — Progress Notes (Signed)
65 YO seen last week with a tender left groin abscess.  Patient was instructed to use warm compresses and take a course of Cephalexin 500 mg qid for management.  States that the abscess drained spontaneously for several days and is now flatter without pain though vaguely palpable.  O:  EGBUS-left groin/mons angle with no evidence of raised lesion however a palpable 1/2 cm area of collection without tenderness; no drainage or adenopathy  A: Left Groin Absecss  P: Complete Cephalexin 10 day course        Continue warm compresses x 7 additional days      RTO-as scheduled or prn   Lovett Coffin, [name]

## 2011-06-22 NOTE — Progress Notes (Signed)
F/u groin abscess  Completed Keflex yesterday  Abscess has gone down but still a little sore

## 2011-06-23 ENCOUNTER — Telehealth: Payer: Self-pay

## 2011-06-23 NOTE — Telephone Encounter (Signed)
CALL IN RX FOR PT 

## 2011-06-23 NOTE — Telephone Encounter (Signed)
TC TO PT REGARDING MESSAGE. PT WENT TO PHARMACY TO GET RX CEPHALEXIN. PT STATES THAT THE RX WAS NOT THERE SO I CALLED PHARMACY TO GIVE THE PHARMACY THE RX CEPHALEXIN 500 MG QID FOR MANAGEMENT, AND I CALL THE PT BACK TO LET HER KNOW. PT WILL PICK UP MED

## 2011-07-07 DIAGNOSIS — D239 Other benign neoplasm of skin, unspecified: Secondary | ICD-10-CM | POA: Diagnosis not present

## 2011-07-07 DIAGNOSIS — L723 Sebaceous cyst: Secondary | ICD-10-CM | POA: Diagnosis not present

## 2011-07-07 DIAGNOSIS — L538 Other specified erythematous conditions: Secondary | ICD-10-CM | POA: Diagnosis not present

## 2011-07-07 DIAGNOSIS — L738 Other specified follicular disorders: Secondary | ICD-10-CM | POA: Diagnosis not present

## 2011-07-17 DIAGNOSIS — J45909 Unspecified asthma, uncomplicated: Secondary | ICD-10-CM | POA: Diagnosis not present

## 2011-07-17 DIAGNOSIS — I10 Essential (primary) hypertension: Secondary | ICD-10-CM | POA: Diagnosis not present

## 2011-07-17 DIAGNOSIS — M1990 Unspecified osteoarthritis, unspecified site: Secondary | ICD-10-CM | POA: Diagnosis not present

## 2011-07-17 DIAGNOSIS — E782 Mixed hyperlipidemia: Secondary | ICD-10-CM | POA: Diagnosis not present

## 2011-08-18 ENCOUNTER — Encounter (HOSPITAL_COMMUNITY): Payer: Self-pay | Admitting: Emergency Medicine

## 2011-08-18 ENCOUNTER — Emergency Department (HOSPITAL_COMMUNITY)
Admission: EM | Admit: 2011-08-18 | Discharge: 2011-08-19 | Disposition: A | Discharge status: Home / Self Care | Payer: Medicare Other | Attending: Emergency Medicine | Admitting: Emergency Medicine

## 2011-08-18 DIAGNOSIS — R011 Cardiac murmur, unspecified: Secondary | ICD-10-CM | POA: Insufficient documentation

## 2011-08-18 DIAGNOSIS — M545 Low back pain: Secondary | ICD-10-CM | POA: Diagnosis not present

## 2011-08-18 DIAGNOSIS — I10 Essential (primary) hypertension: Secondary | ICD-10-CM | POA: Insufficient documentation

## 2011-08-18 DIAGNOSIS — M62830 Muscle spasm of back: Secondary | ICD-10-CM

## 2011-08-18 DIAGNOSIS — J45909 Unspecified asthma, uncomplicated: Secondary | ICD-10-CM | POA: Insufficient documentation

## 2011-08-18 DIAGNOSIS — G935 Compression of brain: Secondary | ICD-10-CM | POA: Insufficient documentation

## 2011-08-18 DIAGNOSIS — Z853 Personal history of malignant neoplasm of breast: Secondary | ICD-10-CM | POA: Insufficient documentation

## 2011-08-18 DIAGNOSIS — Z8544 Personal history of malignant neoplasm of other female genital organs: Secondary | ICD-10-CM | POA: Insufficient documentation

## 2011-08-18 DIAGNOSIS — M5380 Other specified dorsopathies, site unspecified: Secondary | ICD-10-CM | POA: Insufficient documentation

## 2011-08-18 DIAGNOSIS — W19XXXA Unspecified fall, initial encounter: Secondary | ICD-10-CM

## 2011-08-18 MED ORDER — KETOROLAC TROMETHAMINE 60 MG/2ML IM SOLN
60.0000 mg | Freq: Once | INTRAMUSCULAR | Status: AC
  Administered 2011-08-18: 60 mg via INTRAMUSCULAR
  Filled 2011-08-18: qty 2

## 2011-08-18 MED ORDER — DIAZEPAM 5 MG/ML IJ SOLN
10.0000 mg | Freq: Once | INTRAMUSCULAR | Status: AC
  Administered 2011-08-18: 10 mg via INTRAMUSCULAR
  Filled 2011-08-18: qty 2

## 2011-08-18 NOTE — ED Notes (Signed)
Pt states she was in the basement and she slipped causing her to fall   Pt states she did not hit anything but it scared her  Pt states she was able to get up with help from her daughter  Pt is c/o lower back pain  Pt states she is not having constant pain but a spasm type pain that happens with movement  Pt states she took a celebrex 200mg 

## 2011-08-19 DIAGNOSIS — Z853 Personal history of malignant neoplasm of breast: Secondary | ICD-10-CM | POA: Diagnosis not present

## 2011-08-19 DIAGNOSIS — M5380 Other specified dorsopathies, site unspecified: Secondary | ICD-10-CM | POA: Diagnosis not present

## 2011-08-19 DIAGNOSIS — J45909 Unspecified asthma, uncomplicated: Secondary | ICD-10-CM | POA: Diagnosis not present

## 2011-08-19 DIAGNOSIS — Z8544 Personal history of malignant neoplasm of other female genital organs: Secondary | ICD-10-CM | POA: Diagnosis not present

## 2011-08-19 DIAGNOSIS — I10 Essential (primary) hypertension: Secondary | ICD-10-CM | POA: Diagnosis not present

## 2011-08-19 DIAGNOSIS — R011 Cardiac murmur, unspecified: Secondary | ICD-10-CM | POA: Diagnosis not present

## 2011-08-19 DIAGNOSIS — G935 Compression of brain: Secondary | ICD-10-CM | POA: Diagnosis not present

## 2011-08-19 MED ORDER — DIAZEPAM 5 MG PO TABS: 5.0000 mg | ORAL_TABLET | Freq: Three times a day (TID) | ORAL | Status: AC | PRN

## 2011-08-19 NOTE — ED Notes (Signed)
Pt verbalizes understanding 

## 2011-08-19 NOTE — ED Provider Notes (Signed)
History     CSN: [id number]  Arrival date & time 08/18/11  2121   First MD Initiated Contact with Patient 08/18/11 2301      Chief Complaint  Patient presents with  . Fall    (Consider location/radiation/quality/duration/timing/severity/associated sxs/prior treatment) HPI Patient relates this evening she gone down into the basement with her bedroom slippers on and she slipped on the cement floor and fell onto her back in a sitting position on her right side. She denies hitting her head or loss of consciousness. She relates this happened about 7:30 PM. She states she has pain now in the right side of her lower back however if she sits still she has no pain at all however if she moves she feels like she has spasms of pain running up and down her back. She relates she took Celebrex about 8 PM with minimal improvement.  Patient states she has G. are malformation and she is followed by Dr. Dutch Quint, neurosurgery  PCP Dr Felipa Eth  Past Medical History  Diagnosis Date  . Asthma   . Multiple allergies   . Chiari malformation   . Heart murmur   . Hypertension   . H/O varicella   . Yeast infection   . Ovarian cyst   . Cancer 2007    breast ,left  . 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 H/O osteopenia     08/2006  . Monilial vaginitis 07/2007  . 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 Abnormal Pap smear 2006    vaginal medicine according to patient    Past Surgical History  Procedure Date  . Hernia repair   . Abdominal surgery   . Knee surgery     right  . Abdominal hysterectomy   . Breast surgery     Lumpectomy, left breast    Family History  Problem Relation Age of Onset  . Cancer Mother   . Heart failure Other     History  Substance Use Topics  . Smoking status: Former Smoker    Quit date: 04/11/1991  . Smokeless tobacco: Never Used  . Alcohol Use: No  lives with spouse  OB History    Grav Para Term Preterm Abortions TAB SAB Ect Mult Living   0 0  0      1       Obstetric Comments   PT HAS A ADOPTED CHILD GIRL      Review of Systems  All other systems reviewed and are negative.    Allergies  Ephedrine  Home Medications   Current Outpatient Rx  Name Route Sig Dispense Refill  . AMLODIPINE BESYLATE 10 MG PO TABS Oral Take 10 mg by mouth every evening.     Marland Kitchen CALTRATE 600+D PO Oral Take 1 tablet by mouth daily.    . CELECOXIB 200 MG PO CAPS Oral Take 200 mg by mouth daily as needed. For arthritis pain    . CETIRIZINE HCL 10 MG PO TABS Oral Take 10 mg by mouth daily.    Marland Kitchen ESOMEPRAZOLE MAGNESIUM 40 MG PO CPDR Oral Take 40 mg by mouth daily before breakfast.    . FLUTICASONE-SALMETEROL 250-50 MCG/DOSE IN AEPB Inhalation Inhale 1 puff into the lungs every 12 (twelve) hours.    Marland Kitchen LOSARTAN POTASSIUM 50 MG PO TABS Oral Take 50 mg by mouth daily.    . ADULT MULTIVITAMIN W/MINERALS CH Oral Take 1 tablet by mouth daily.    Marland Kitchen POTASSIUM CHLORIDE CRYS ER 20 MEQ  PO TBCR Oral Take 60 mEq by mouth daily.    Marland Kitchen SIMVASTATIN 20 MG PO TABS Oral Take 20 mg by mouth every evening.    . TRIAMTERENE-HCTZ 75-50 MG PO TABS Oral Take 1 tablet by mouth daily.    . ALBUTEROL SULFATE (2.5 MG/3ML) 0.083% IN NEBU Nebulization Take 2.5 mg by nebulization every 6 (six) hours as needed. For shortness of breath      BP 119/67  Pulse 80  Temp 98.6 F (37 C) (Oral)  Resp 16  SpO2 100%  Vital signs normal    Physical Exam  Nursing note and vitals reviewed. Constitutional: She is oriented to person, place, and time. She appears well-developed and well-nourished.  Non-toxic appearance. She does not appear ill. No distress.  HENT:  Head: Normocephalic and atraumatic.  Right Ear: External ear normal.  Left Ear: External ear normal.  Nose: Nose normal. No mucosal edema or rhinorrhea.  Mouth/Throat: Oropharynx is clear and moist and mucous membranes are normal. No dental abscesses or uvula swelling.  Eyes: Conjunctivae and EOM are normal. Pupils are equal, round, and  reactive to light.  Neck: Normal range of motion and full passive range of motion without pain. Neck supple.  Cardiovascular: Normal rate, regular rhythm and normal heart sounds.  Exam reveals no gallop and no friction rub.   No murmur heard. Pulmonary/Chest: Effort normal and breath sounds normal. No respiratory distress. She has no wheezes. She has no rhonchi. She has no rales. She exhibits no tenderness and no crepitus.  Abdominal: Soft. Normal appearance and bowel sounds are normal. She exhibits no distension. There is no tenderness. There is no rebound and no guarding.  Musculoskeletal: Normal range of motion. She exhibits no edema and no tenderness.       Moves all extremities well. Patient is nontender to palpation of her cervical and thoracic spine. She does not have tenderness over her lumbar spine however she has discomfort over the right side of her lateral back that is non-reproducible and seems to vary. However when patient changes position she screams out and grabs her side as if she is having spasms.  Neurological: She is alert and oriented to person, place, and time. She has normal strength. No cranial nerve deficit.  Skin: Skin is warm, dry and intact. No rash noted. No erythema. No pallor.  Psychiatric: Her speech is normal and behavior is normal. Her mood appears not anxious.       anxious    ED Course  Procedures (including critical care time)    Medications  diazepam (VALIUM) injection 10 mg (10 mg Intramuscular Given 08/18/11 2356)  ketorolac (TORADOL) injection 60 mg (60 mg Intramuscular Given 08/18/11 2357)   Pt now ambulating to the bathroom and feels better.  States her pain is better.    1. Fall   2. Muscle spasm of back     New Prescriptions   DIAZEPAM (VALIUM) 5 MG TABLET    Take 1 tablet (5 mg total) by mouth every 8 (eight) hours as needed (muscle spasms).    Plan discharge  Devoria Albe, MD, FACEP   MDM          Ward Givens, MD 08/19/11  [phone]

## 2011-08-19 NOTE — ED Notes (Signed)
Pt able to ambulate with slight assistance with standing and pt using cane. Pt able to restroom on own

## 2011-08-21 DIAGNOSIS — M545 Low back pain: Secondary | ICD-10-CM | POA: Diagnosis not present

## 2011-08-21 DIAGNOSIS — B3781 Candidal esophagitis: Secondary | ICD-10-CM | POA: Diagnosis not present

## 2011-08-21 DIAGNOSIS — I10 Essential (primary) hypertension: Secondary | ICD-10-CM | POA: Diagnosis not present

## 2011-08-22 ENCOUNTER — Telehealth: Payer: Self-pay | Admitting: Obstetrics and Gynecology

## 2011-08-22 ENCOUNTER — Other Ambulatory Visit: Payer: Self-pay | Admitting: Internal Medicine

## 2011-08-22 DIAGNOSIS — Z1231 Encounter for screening mammogram for malignant neoplasm of breast: Secondary | ICD-10-CM

## 2011-08-22 NOTE — Telephone Encounter (Signed)
TRIAGE/EPIC °

## 2011-08-24 NOTE — Telephone Encounter (Signed)
3-D mammogram will be more precise but may not be covered by insurance. The additional cost may be around 60 dollars.

## 2011-08-24 NOTE — Telephone Encounter (Signed)
Tc to pt per v[facility] recs rgdg 3D mammogram. Pt voices understanding.

## 2011-08-24 NOTE — Telephone Encounter (Signed)
Tc to pt per telephone. Pt wants to know if 3D ultrasound vs a regular mammo is needed due to her history of breast cancer. Pt sched for mammo in 09/2011 per pt. Will consult with [facility] and cb with recs. Pt agrees.

## 2011-09-01 DIAGNOSIS — M545 Low back pain: Secondary | ICD-10-CM | POA: Diagnosis not present

## 2011-09-01 DIAGNOSIS — R609 Edema, unspecified: Secondary | ICD-10-CM | POA: Diagnosis not present

## 2011-09-01 DIAGNOSIS — I10 Essential (primary) hypertension: Secondary | ICD-10-CM | POA: Diagnosis not present

## 2011-09-06 DIAGNOSIS — R609 Edema, unspecified: Secondary | ICD-10-CM | POA: Diagnosis not present

## 2011-09-06 DIAGNOSIS — I10 Essential (primary) hypertension: Secondary | ICD-10-CM | POA: Diagnosis not present

## 2011-09-06 DIAGNOSIS — J45909 Unspecified asthma, uncomplicated: Secondary | ICD-10-CM | POA: Diagnosis not present

## 2011-09-29 DIAGNOSIS — R51 Headache: Secondary | ICD-10-CM | POA: Diagnosis not present

## 2011-10-02 DIAGNOSIS — J309 Allergic rhinitis, unspecified: Secondary | ICD-10-CM | POA: Diagnosis not present

## 2011-10-02 DIAGNOSIS — I10 Essential (primary) hypertension: Secondary | ICD-10-CM | POA: Diagnosis not present

## 2011-10-02 DIAGNOSIS — R609 Edema, unspecified: Secondary | ICD-10-CM | POA: Diagnosis not present

## 2011-10-02 DIAGNOSIS — Z23 Encounter for immunization: Secondary | ICD-10-CM | POA: Diagnosis not present

## 2011-10-02 DIAGNOSIS — J45909 Unspecified asthma, uncomplicated: Secondary | ICD-10-CM | POA: Diagnosis not present

## 2011-10-04 ENCOUNTER — Ambulatory Visit
Admission: RE | Admit: 2011-10-04 | Discharge: 2011-10-04 | Disposition: A | Discharge status: Home / Self Care | Payer: BC Managed Care – PPO | Source: Ambulatory Visit | Attending: Internal Medicine | Admitting: Internal Medicine

## 2011-10-04 DIAGNOSIS — Z1231 Encounter for screening mammogram for malignant neoplasm of breast: Secondary | ICD-10-CM | POA: Diagnosis not present

## 2011-10-16 DIAGNOSIS — J45909 Unspecified asthma, uncomplicated: Secondary | ICD-10-CM | POA: Diagnosis not present

## 2011-10-16 DIAGNOSIS — B3781 Candidal esophagitis: Secondary | ICD-10-CM | POA: Diagnosis not present

## 2011-10-16 DIAGNOSIS — L0292 Furuncle, unspecified: Secondary | ICD-10-CM | POA: Diagnosis not present

## 2011-10-16 DIAGNOSIS — L0293 Carbuncle, unspecified: Secondary | ICD-10-CM | POA: Diagnosis not present

## 2011-10-16 DIAGNOSIS — J069 Acute upper respiratory infection, unspecified: Secondary | ICD-10-CM | POA: Diagnosis not present

## 2011-12-05 DIAGNOSIS — H40029 Open angle with borderline findings, high risk, unspecified eye: Secondary | ICD-10-CM | POA: Diagnosis not present

## 2011-12-13 DIAGNOSIS — I10 Essential (primary) hypertension: Secondary | ICD-10-CM | POA: Diagnosis not present

## 2011-12-13 DIAGNOSIS — R609 Edema, unspecified: Secondary | ICD-10-CM | POA: Diagnosis not present

## 2011-12-13 DIAGNOSIS — E782 Mixed hyperlipidemia: Secondary | ICD-10-CM | POA: Diagnosis not present

## 2011-12-20 ENCOUNTER — Encounter: Payer: Self-pay | Admitting: Cardiovascular Disease

## 2011-12-20 DIAGNOSIS — E782 Mixed hyperlipidemia: Secondary | ICD-10-CM | POA: Diagnosis not present

## 2011-12-20 DIAGNOSIS — Z23 Encounter for immunization: Secondary | ICD-10-CM | POA: Diagnosis not present

## 2011-12-20 DIAGNOSIS — I10 Essential (primary) hypertension: Secondary | ICD-10-CM | POA: Diagnosis not present

## 2011-12-20 DIAGNOSIS — Z0000 Encounter for general adult medical examination without abnormal findings: Secondary | ICD-10-CM | POA: Diagnosis not present

## 2011-12-20 DIAGNOSIS — R609 Edema, unspecified: Secondary | ICD-10-CM | POA: Diagnosis not present

## 2011-12-20 DIAGNOSIS — Z1331 Encounter for screening for depression: Secondary | ICD-10-CM | POA: Diagnosis not present

## 2011-12-20 DIAGNOSIS — J45909 Unspecified asthma, uncomplicated: Secondary | ICD-10-CM | POA: Diagnosis not present

## 2011-12-25 ENCOUNTER — Ambulatory Visit (INDEPENDENT_AMBULATORY_CARE_PROVIDER_SITE_OTHER): Payer: Medicare Other | Admitting: Obstetrics and Gynecology

## 2011-12-25 ENCOUNTER — Encounter: Payer: Self-pay | Admitting: Obstetrics and Gynecology

## 2011-12-25 VITALS — BP 122/76 | Ht 67.0 in | Wt 262.0 lb

## 2011-12-25 DIAGNOSIS — D071 Carcinoma in situ of vulva: Secondary | ICD-10-CM | POA: Diagnosis not present

## 2011-12-25 DIAGNOSIS — C801 Malignant (primary) neoplasm, unspecified: Secondary | ICD-10-CM | POA: Diagnosis not present

## 2011-12-25 DIAGNOSIS — Z1212 Encounter for screening for malignant neoplasm of rectum: Secondary | ICD-10-CM | POA: Diagnosis not present

## 2011-12-25 DIAGNOSIS — Z124 Encounter for screening for malignant neoplasm of cervix: Secondary | ICD-10-CM | POA: Diagnosis not present

## 2011-12-25 DIAGNOSIS — Z719 Counseling, unspecified: Secondary | ICD-10-CM

## 2011-12-25 DIAGNOSIS — Z0000 Encounter for general adult medical examination without abnormal findings: Secondary | ICD-10-CM | POA: Diagnosis not present

## 2011-12-25 NOTE — Progress Notes (Signed)
Subjective:    Bridget Cortez is a 66 y.o. female [id number] who presents for followup of vulvar intraepithelial neoplasia III and is s/p wide local excision without evidence of recurrence. She denies vulva itching or lesions or bleeding.  She has a personal history of breast cancer but has been without evidence of disease for greater than 10 year's and had a recently normal mammogram .  She has numerous pigmented nevi and is followed by Dr. Amy Swaziland for that. She recently saw her PCP for evaluation of her other chronic medical problems.    Last Pap: 07/05/2005 WNL: Yes Regular Periods:no Contraception: hyst  Monthly Breast exam:yes Tetanus<94yrs:? Nl.Bladder Function:yes Daily BMs:yes Healthy Diet:yes Calcium:yes Mammogram:yes Date of Mammogram: 10/04/2011 Exercise:yes Have often Exercise: 2x weekly Seatbelt: yes Abuse at home: no Stressful work:no Sigmoid-colonoscopy:approx 1yrs ago. Colonoscopy to be sched in Jan 2014 Bone Density: Yes 11/28/2010-normal PCP: Dr. Felipa Eth Change in PMH: no change  Change in FMH:no change BMI=41  The following portions of the patient's history were reviewed and updated as appropriate: allergies, current medications, past family history, past medical history, past social history, past surgical history and problem list.  Review of Systems Pertinent items are noted in HPI. Gastrointestinal:No change in bowel habits, no abdominal pain, no rectal bleeding Genitourinary:negative for dysuria, frequency, hematuria, nocturia and urinary incontinence    Objective:     Ht 5\' 7"  (1.702 m)  Wt 262 lb (118.842 kg)  BMI 41.03 kg/m2  Weight:  Wt Readings from Last 1 Encounters:  12/25/11 262 lb (118.842 kg)     BMI: Body mass index is 41.03 kg/(m^2). General Appearance: Alert, appropriate appearance for age. No acute distress HEENT: Grossly normal Neck / Thyroid: Supple, no masses, nodes or enlargement Lungs: clear to auscultation bilaterally Back: No  CVA tenderness Breast Exam: No masses or nodes.No dimpling, nipple retraction or discharge. Cardiovascular: Regular rate and rhythm. S1, S2, no murmur Gastrointestinal: Soft, non-tender, no masses or organomegaly Pelvic Exam: External genitalia: No lesions noted Vaginal: atrophic mucosa and vaginal vault, Well healed and well suspended Cervix: removed surgically Adnexa: non palpable Uterus: removed surgically Exam limited by body habitus Rectovaginal: normal rectal, no masses Lymphatic Exam: Non-palpable nodes in neck, clavicular, axillary, or inguinal regions Skin: Numerous pigmented nevi Neurologic: Normal gait and speech, no tremor  Psychiatric: Alert and oriented, appropriate affect.    Urinalysis:Not done    Assessment:    History of vulvar intraepithelial lesion 3.  Status post wide local excision without evidence of recurrence  History of breast cancer now no evidence of disease   Plan:   mammogram return annually or prn    Dierdre Forth MD

## 2011-12-28 DIAGNOSIS — L821 Other seborrheic keratosis: Secondary | ICD-10-CM | POA: Diagnosis not present

## 2011-12-28 DIAGNOSIS — L919 Hypertrophic disorder of the skin, unspecified: Secondary | ICD-10-CM | POA: Diagnosis not present

## 2011-12-28 DIAGNOSIS — L538 Other specified erythematous conditions: Secondary | ICD-10-CM | POA: Diagnosis not present

## 2011-12-28 DIAGNOSIS — L909 Atrophic disorder of skin, unspecified: Secondary | ICD-10-CM | POA: Diagnosis not present

## 2011-12-28 DIAGNOSIS — D2370 Other benign neoplasm of skin of unspecified lower limb, including hip: Secondary | ICD-10-CM | POA: Diagnosis not present

## 2012-01-30 ENCOUNTER — Encounter: Payer: Self-pay | Admitting: Internal Medicine

## 2012-02-01 DIAGNOSIS — M545 Low back pain: Secondary | ICD-10-CM | POA: Diagnosis not present

## 2012-02-01 DIAGNOSIS — K219 Gastro-esophageal reflux disease without esophagitis: Secondary | ICD-10-CM | POA: Diagnosis not present

## 2012-02-01 DIAGNOSIS — R609 Edema, unspecified: Secondary | ICD-10-CM | POA: Diagnosis not present

## 2012-02-01 DIAGNOSIS — R252 Cramp and spasm: Secondary | ICD-10-CM | POA: Diagnosis not present

## 2012-02-21 DIAGNOSIS — M25519 Pain in unspecified shoulder: Secondary | ICD-10-CM | POA: Diagnosis not present

## 2012-02-23 DIAGNOSIS — M25519 Pain in unspecified shoulder: Secondary | ICD-10-CM | POA: Diagnosis not present

## 2012-02-26 DIAGNOSIS — M25519 Pain in unspecified shoulder: Secondary | ICD-10-CM | POA: Diagnosis not present

## 2012-02-28 ENCOUNTER — Ambulatory Visit (AMBULATORY_SURGERY_CENTER): Payer: Medicare Other | Admitting: *Deleted

## 2012-02-28 VITALS — Ht 67.0 in | Wt 263.2 lb

## 2012-02-28 DIAGNOSIS — Z1211 Encounter for screening for malignant neoplasm of colon: Secondary | ICD-10-CM

## 2012-02-28 DIAGNOSIS — M25519 Pain in unspecified shoulder: Secondary | ICD-10-CM | POA: Diagnosis not present

## 2012-02-28 MED ORDER — NA SULFATE-K SULFATE-MG SULF 17.5-3.13-1.6 GM/177ML PO SOLN: 1.0000 | Freq: Once | ORAL | Status: DC

## 2012-03-05 DIAGNOSIS — M25519 Pain in unspecified shoulder: Secondary | ICD-10-CM | POA: Diagnosis not present

## 2012-03-11 DIAGNOSIS — M25519 Pain in unspecified shoulder: Secondary | ICD-10-CM | POA: Diagnosis not present

## 2012-03-13 ENCOUNTER — Encounter: Payer: Self-pay | Admitting: Internal Medicine

## 2012-03-13 ENCOUNTER — Ambulatory Visit (AMBULATORY_SURGERY_CENTER): Payer: Medicare Other | Admitting: Internal Medicine

## 2012-03-13 VITALS — BP 164/65 | HR 72 | Temp 98.5°F | Resp 18 | Ht 67.0 in | Wt 263.0 lb

## 2012-03-13 DIAGNOSIS — J45909 Unspecified asthma, uncomplicated: Secondary | ICD-10-CM | POA: Diagnosis not present

## 2012-03-13 DIAGNOSIS — Z1211 Encounter for screening for malignant neoplasm of colon: Secondary | ICD-10-CM | POA: Diagnosis not present

## 2012-03-13 DIAGNOSIS — Z860100 Personal history of colon polyps, unspecified: Secondary | ICD-10-CM

## 2012-03-13 DIAGNOSIS — K5730 Diverticulosis of large intestine without perforation or abscess without bleeding: Secondary | ICD-10-CM

## 2012-03-13 DIAGNOSIS — D126 Benign neoplasm of colon, unspecified: Secondary | ICD-10-CM

## 2012-03-13 DIAGNOSIS — Z853 Personal history of malignant neoplasm of breast: Secondary | ICD-10-CM | POA: Diagnosis not present

## 2012-03-13 HISTORY — DX: Personal history of colon polyps, unspecified: Z86.0100

## 2012-03-13 HISTORY — DX: Personal history of colonic polyps: Z86.010

## 2012-03-13 MED ORDER — SODIUM CHLORIDE 0.9 % IV SOLN: 500.0000 mL | INTRAVENOUS | Status: DC

## 2012-03-13 NOTE — Op Note (Signed)
[facility] [address] [place], [address]   COLONOSCOPY PROCEDURE REPORT  PATIENT: Winfred, Redel  MR#: [id number] BIRTHDATE: [birth year] , 66  yrs. old GENDER: Female ENDOSCOPIST: Iva Boop, MD, [facility] REFERRED QI:[id number] Felipa Eth, M.D. PROCEDURE DATE:  03/13/2012 PROCEDURE:   Colonoscopy with biopsy and snare polypectomy ASA CLASS:   Class II INDICATIONS:average risk screening. MEDICATIONS: propofol (Diprivan) 250mg  IV, MAC sedation, administered by CRNA, and These medications were titrated to patient response per physician's verbal order  DESCRIPTION OF PROCEDURE:   After the risks benefits and alternatives of the procedure were thoroughly explained, informed consent was obtained.  A digital rectal exam revealed no abnormalities of the rectum.   The LB [id number]-[id number]  endoscope was introduced through the anus and advanced to the cecum, which was identified by both the appendix and ileocecal valve. No adverse events experienced.   The quality of the prep was Suprep excellent The instrument was then slowly withdrawn as the colon was fully examined.      COLON FINDINGS: Three polypoid shaped sessile polyps ranging between 3-80mm in size were found in the ascending colon.  A polypectomy was performed with cold forceps (the two 3mm polyps) and with a cold snare (the 5 mm polyp).  The resection was complete and the polyp tissue was completely retrieved.   There was severe diverticulosis noted in the left colon with associated muscular hypertrophy and luminal narrowing.   The colon mucosa was otherwise normal.   A right colon retroflexion was performed.  Retroflexed views revealed no abnormalities. The time to cecum=3 minutes 38 seconds. Withdrawal time=15 minutes 22 seconds.  The scope was withdrawn and the procedure completed. COMPLICATIONS: There were no complications.  ENDOSCOPIC IMPRESSION: 1.   Three sessile polyps ranging between 3-110mm  in size were found in the ascending colon; polypectomy was performed with cold forceps and with a cold snare 2.   There was severe diverticulosis noted in the left colon 3.   The colon mucosa was otherwise normal - excellent prep  RECOMMENDATIONS: Timing of repeat colonoscopy will be determined by pathology findings.   eSigned:  Iva Boop, MD, [facility] 03/13/2012 9:57 AM   cc: Chilton Greathouse, MD, Dierdre Forth, MD, and The Patient   PATIENT NAME:  Lisbet, Busker MR#: [id number]

## 2012-03-13 NOTE — Patient Instructions (Addendum)
Three very small polyps were removed. Do not worry about these - they look benign.  You also have diverticulosis - a common condition that is not usually a significant problem.   I will let you know pathology results and when to have another routine colonoscopy by mail.  Thank you for choosing me and [facility].  Iva Boop, MD, FACG YOU HAD AN ENDOSCOPIC PROCEDURE TODAY AT THE [facility]: Refer to the procedure report that was given to you for any specific questions about what was found during the examination.  If the procedure report does not answer your questions, please call your gastroenterologist to clarify.  If you requested that your care partner not be given the details of your procedure findings, then the procedure report has been included in a sealed envelope for you to review at your convenience later.  YOU SHOULD EXPECT: Some feelings of bloating in the abdomen. Passage of more gas than usual.  Walking can help get rid of the air that was put into your GI tract during the procedure and reduce the bloating. If you had a lower endoscopy (such as a colonoscopy or flexible sigmoidoscopy) you may notice spotting of blood in your stool or on the toilet paper. If you underwent a bowel prep for your procedure, then you may not have a normal bowel movement for a few days.  DIET: Your first meal following the procedure should be a light meal and then it is ok to progress to your normal diet.  A half-sandwich or bowl of soup is an example of a good first meal.  Heavy or fried foods are harder to digest and may make you feel nauseous or bloated.  Likewise meals heavy in dairy and vegetables can cause extra gas to form and this can also increase the bloating.  Drink plenty of fluids but you should avoid alcoholic beverages for 24 hours.  ACTIVITY: Your care partner should take you home directly after the procedure.  You should plan to take it easy, moving slowly for  the rest of the day.  You can resume normal activity the day after the procedure however you should NOT DRIVE or use heavy machinery for 24 hours (because of the sedation medicines used during the test).    SYMPTOMS TO REPORT IMMEDIATELY: A gastroenterologist can be reached at any hour.  During normal business hours, 8:30 AM to 5:00 PM Monday through Friday, call [phone].  After hours and on weekends, please call the GI answering service at [phone] who will take a message and have the physician on call contact you.   Following lower endoscopy (colonoscopy or flexible sigmoidoscopy):  Excessive amounts of blood in the stool  Significant tenderness or worsening of abdominal pains  Swelling of the abdomen that is new, acute  Fever of 100F or higher   FOLLOW UP: If any biopsies were taken you will be contacted by phone or by letter within the next 1-3 weeks.  Call your gastroenterologist if you have not heard about the biopsies in 3 weeks.  Our staff will call the home number listed on your records the next business day following your procedure to check on you and address any questions or concerns that you may have at that time regarding the information given to you following your procedure. This is a courtesy call and so if there is no answer at the home number and we have not heard from you through the emergency physician on  call, we will assume that you have returned to your regular daily activities without incident.  SIGNATURES/CONFIDENTIALITY: You and/or your care partner have signed paperwork which will be entered into your electronic medical record.  These signatures attest to the fact that that the information above on your After Visit Summary has been reviewed and is understood.  Full responsibility of the confidentiality of this discharge information lies with you and/or your care-partner.   Polyp and diverticulosis information given.  Dr. Leone Payor will advise you regarding  next colonoscopy after pathology results are received.

## 2012-03-13 NOTE — Progress Notes (Signed)
Patient did not experience any of the following events: a burn prior to discharge; a fall within the facility; wrong site/side/patient/procedure/implant event; or a hospital transfer or hospital admission upon discharge from the facility. (G8907) Patient did not have preoperative order for IV antibiotic SSI prophylaxis. (G8918)  

## 2012-03-13 NOTE — Progress Notes (Signed)
Called to room to assist during endoscopic procedure.  Patient ID and intended procedure confirmed with present staff. Received instructions for my participation in the procedure from the performing physician.  

## 2012-03-14 ENCOUNTER — Telehealth: Payer: Self-pay | Admitting: *Deleted

## 2012-03-14 NOTE — Telephone Encounter (Signed)
  Follow up Call-  Call back number 03/13/2012  Post procedure Call Back phone  # [phone]  Permission to leave phone message Yes     Patient questions:  Do you have a fever, pain , or abdominal swelling? no Pain Score  0 *  Have you tolerated food without any problems? yes  Have you been able to return to your normal activities? yes  Do you have any questions about your discharge instructions: Diet   no Medications  no Follow up visit  no  Do you have questions or concerns about your Care? no  Actions: * If pain score is 4 or above: No action needed, pain <4.

## 2012-03-18 ENCOUNTER — Encounter (INDEPENDENT_AMBULATORY_CARE_PROVIDER_SITE_OTHER): Payer: Self-pay | Admitting: General Surgery

## 2012-03-18 ENCOUNTER — Telehealth (INDEPENDENT_AMBULATORY_CARE_PROVIDER_SITE_OTHER): Payer: Self-pay

## 2012-03-18 ENCOUNTER — Ambulatory Visit (INDEPENDENT_AMBULATORY_CARE_PROVIDER_SITE_OTHER): Payer: Medicare Other | Admitting: General Surgery

## 2012-03-18 VITALS — BP 140/82 | HR 108 | Temp 100.0°F | Resp 12 | Ht 68.5 in | Wt 255.5 lb

## 2012-03-18 DIAGNOSIS — L0291 Cutaneous abscess, unspecified: Secondary | ICD-10-CM | POA: Diagnosis not present

## 2012-03-18 DIAGNOSIS — L03317 Cellulitis of buttock: Secondary | ICD-10-CM | POA: Diagnosis not present

## 2012-03-18 DIAGNOSIS — L0390 Cellulitis, unspecified: Secondary | ICD-10-CM | POA: Diagnosis not present

## 2012-03-18 DIAGNOSIS — L0231 Cutaneous abscess of buttock: Secondary | ICD-10-CM

## 2012-03-18 MED ORDER — HYDROCODONE-ACETAMINOPHEN 5-500 MG PO TABS: 1.0000 | ORAL_TABLET | ORAL | Status: DC | PRN

## 2012-03-18 NOTE — Patient Instructions (Addendum)
Home Instructions Following Incision and Drainage of Perirectal Abscess  Wound care - A dressing has been applied to control any bleeding or drainage immediately after your procedure.  You may remove this dressing tomorrow morning, whichever comes first.  There may be packing inside your wound as well that should be removed with the dressing.  You do not need to repack the area.  After the dressing is removed, clean the area gently with a mild soap and warm water and place a an new dressing over it to keep the area clean and dry.   - Beginning tomorrow, sit in a tub of warm water for 15-20 minutes at least twice a day and after bowel movements.  This will help with healing, pain and discomfort.  Cover the area with a clean dressing once dry.   - A small amount of bleeding is to be expected.  If you notice an increase in the bleeding, place a large piece of cotton (about the size of a golf ball) next to the anal opening and sit on a hard surface for 15 minutes.  If the bleeding persists or if you are concerned, please call the office.  Do not sit on rubber rings.  Instead, sit on a soft pillow.    Diet -Eat a regular diet.  Avoid foods that may constipate you or give you diarrhea.  Drink 6-8 glasses of water a day and avoid seeds, nuts and popcorn until the area heals.  Medication -Take pain medication as directed.  Do not drive or operate machinery if you are taking a prescription pain medication.   - We recommend Extra Strength Tylenol for mild to moderate pain.  This can be taken as instructed on the bottle.  Do not take with the Vicodin pain medication.    Bowel Habits  Avoid laxatives unless instructed by your doctor. Take a fiber supplement twice a day (Metamucil, FiberCon, Benefiber) Avoid excessive straining to have a bowel movement Do not go for more than 3 days without a bowel movement.  Take a regular Fleet enema if you are constipated.  Call the office if unable to do this or no  results.    Activity Resume activities as tolerated beginning tomorrow.  Avoid strenuous activities or sports for one week.    Call the office if you have any questions.  Call IMMEDIATELY if you should develop persistent heavy rectal bleeding, increase in pain, difficulty urinating or fever greater than 100 F.

## 2012-03-18 NOTE — Progress Notes (Signed)
Chief Complaint  Patient presents with  . Abscess    HISTORY:  Bridget Cortez is a 67 y.o. female who presents to clinic with R buttock abscess.  This has been present since last Thu and has gotten larger.  She has tried neosporin and warm compresses.    Past Medical History  Diagnosis Date  . Asthma   . Multiple allergies   . Chiari malformation   . Heart murmur   . Hypertension   . H/O varicella   . Yeast infection   . Ovarian cyst   . Cancer 2007    breast ,left  . 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 H/O osteopenia     08/2006  . Monilial vaginitis 07/2007  . 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 Abnormal Pap smear 2006    vaginal medicine according to patient       Past Surgical History  Procedure Laterality Date  . Hernia repair    . Abdominal surgery    . Knee surgery      right  . Abdominal hysterectomy    . Breast surgery      Lumpectomy, left breast      Current Outpatient Prescriptions  Medication Sig Dispense Refill  . albuterol (PROVENTIL) (2.5 MG/3ML) 0.083% nebulizer solution Take 2.5 mg by nebulization every 6 (six) hours as needed. For shortness of breath      . amLODipine (NORVASC) 10 MG tablet Take 10 mg by mouth every evening.       . Calcium Carbonate-Vitamin D (CALTRATE 600+D PO) Take 1 tablet by mouth daily.      . celecoxib (CELEBREX) 200 MG capsule Take 200 mg by mouth daily as needed. For arthritis pain      . cetirizine (ZYRTEC) 10 MG tablet Take 10 mg by mouth daily.      Marland Kitchen esomeprazole (NEXIUM) 40 MG capsule Take 40 mg by mouth daily before breakfast.      . Fluticasone-Salmeterol (ADVAIR) 250-50 MCG/DOSE AEPB Inhale 1 puff into the lungs every 12 (twelve) hours.      Marland Kitchen losartan (COZAAR) 50 MG tablet Take 50 mg by mouth daily.      . Multiple Vitamin (MULTIVITAMIN WITH MINERALS) TABS Take 1 tablet by mouth daily.      . potassium chloride SA (K-DUR,KLOR-CON) 20 MEQ tablet Take 60 mEq by mouth daily.      . simvastatin (ZOCOR)  20 MG tablet Take 20 mg by mouth every evening.      . triamcinolone cream (KENALOG) 0.1 % Apply 1 application topically as needed.      . triamterene-hydrochlorothiazide (MAXZIDE) 75-50 MG per tablet Take 1 tablet by mouth daily.       No current facility-administered medications for this visit.     Allergies  Allergen Reactions  . Ephedrine Shortness Of Breath      Family History  Problem Relation Age of Onset  . Cancer Mother   . Heart failure Other   . Colon cancer Neg Hx   . Esophageal cancer Neg Hx   . Rectal cancer Neg Hx   . Stomach cancer Neg Hx       History   Social History  . Marital Status: Married    Spouse Name: N/A    Number of Children: N/A  . Years of Education: N/A   Social History Main Topics  . Smoking status: Former Smoker    Quit date: 01/23/1978  . Smokeless tobacco: Never Used  .  Alcohol Use: No  . Drug Use: No  . Sexually Active: Yes    Birth Control/ Protection: Surgical   Other Topics Concern  . None   Social History Narrative  . None       REVIEW OF SYSTEMS - PERTINENT POSITIVES ONLY: Review of Systems - General ROS: negative for - chills, fatigue or fever Hematological and Lymphatic ROS: negative for - bleeding problems or blood clots Respiratory ROS: no cough, shortness of breath, or wheezing Cardiovascular ROS: no chest pain or dyspnea on exertion Gastrointestinal ROS: no abdominal pain, change in bowel habits, or black or bloody stools  EXAM: Filed Vitals:   03/18/12 1653  BP: 140/82  Pulse: 108  Temp: 100 F (37.8 C)  Resp: 12    General appearance: alert and cooperative Resp: clear to auscultation bilaterally Cardio: regular rate and rhythm GI: soft, non-tender; bowel sounds normal; no masses,  no organomegaly R inferior buttock mass, erythematous, ~5cm in diameter  After verbal consent obtained.  Area infused with SQ lidocaine.  Cruciate incision made with scalpel.  Purulent debris removed.  Packed with iodoform  guaze. Covered with sterile pressure dression   ASSESSMENT AND PLAN: Bridget Cortez is a 67 y.o. F who presents to clinic with a R buttock abscess, which was incised and drained in the office.  She should keep the area clean and soak in warm water daily.  She can remove the packing and change the dressing in the morning.  She does not need to repack the wound.      Vanita Panda, MD Colon and Rectal Surgery / General Surgery [facility]      Visit Diagnoses: 1. Abscess of buttock, right     Primary Care Physician: Hoyle Sauer, MD

## 2012-03-19 ENCOUNTER — Telehealth (INDEPENDENT_AMBULATORY_CARE_PROVIDER_SITE_OTHER): Payer: Self-pay | Admitting: General Surgery

## 2012-03-19 NOTE — Telephone Encounter (Signed)
error 

## 2012-03-19 NOTE — Telephone Encounter (Signed)
Dr. Ezzard Standing received call from this pt overnight; her pain meds were sent via Epic, but are narcotics, so they were not filled.  He called in "Vicodin 5/325 mg, # 20, no refills to [facility]: [phone]."

## 2012-03-20 ENCOUNTER — Encounter: Payer: Self-pay | Admitting: Internal Medicine

## 2012-03-20 DIAGNOSIS — M25519 Pain in unspecified shoulder: Secondary | ICD-10-CM | POA: Diagnosis not present

## 2012-03-20 DIAGNOSIS — Z860100 Personal history of colon polyps, unspecified: Secondary | ICD-10-CM | POA: Insufficient documentation

## 2012-03-20 NOTE — Progress Notes (Signed)
Quick Note:  Diminutive adenomas x 2-3  Repeat colonoscopy about 02/2017 ______

## 2012-03-26 ENCOUNTER — Encounter (INDEPENDENT_AMBULATORY_CARE_PROVIDER_SITE_OTHER): Payer: Self-pay | Admitting: General Surgery

## 2012-03-26 ENCOUNTER — Ambulatory Visit (INDEPENDENT_AMBULATORY_CARE_PROVIDER_SITE_OTHER): Payer: Medicare Other | Admitting: General Surgery

## 2012-03-26 VITALS — BP 138/88 | HR 85 | Temp 98.0°F | Resp 18 | Ht 68.0 in | Wt 255.0 lb

## 2012-03-26 DIAGNOSIS — L0231 Cutaneous abscess of buttock: Secondary | ICD-10-CM

## 2012-03-26 NOTE — Progress Notes (Signed)
Bridget Cortez is a 67 y.o. female who is status post a I&D last week.  She is cleaning the area twice a day.  The pain is better, drainage is less.    Objective: Filed Vitals:   03/26/12 1204  BP: 138/88  Pulse: 85  Temp: 98 F (36.7 C)  Resp: 18    General appearance: alert and cooperative  Incision: healing well   Assessment: s/p  Patient Active Problem List  Diagnosis  . Vulvar intraepithelial neoplasia III (VIN III)  . Chiari malformation  . Heart murmur  . H/O varicella  . Yeast infection  . Ovarian cyst  . Cancer  . Atrophy of vagina  . H/O osteopenia  . Monilial vaginitis  . VIN III (vulvar intraepithelial neoplasia III)  . Abnormal Pap smear  . Personal history of colonic adenomas    Plan: RTO in 1 week    .Vanita Panda, MD [facility], [name] [phone]   03/26/2012 12:46 PM

## 2012-03-26 NOTE — Patient Instructions (Signed)
Change dressing daily

## 2012-03-27 DIAGNOSIS — M25519 Pain in unspecified shoulder: Secondary | ICD-10-CM | POA: Diagnosis not present

## 2012-04-01 DIAGNOSIS — M25519 Pain in unspecified shoulder: Secondary | ICD-10-CM | POA: Diagnosis not present

## 2012-04-03 ENCOUNTER — Ambulatory Visit (INDEPENDENT_AMBULATORY_CARE_PROVIDER_SITE_OTHER): Payer: Medicare Other | Admitting: General Surgery

## 2012-04-03 ENCOUNTER — Encounter (INDEPENDENT_AMBULATORY_CARE_PROVIDER_SITE_OTHER): Payer: Self-pay | Admitting: General Surgery

## 2012-04-03 VITALS — BP 122/82 | HR 80 | Temp 98.6°F | Resp 18 | Ht 69.0 in | Wt 254.4 lb

## 2012-04-03 DIAGNOSIS — L0231 Cutaneous abscess of buttock: Secondary | ICD-10-CM

## 2012-04-03 DIAGNOSIS — L03317 Cellulitis of buttock: Secondary | ICD-10-CM

## 2012-04-03 NOTE — Progress Notes (Signed)
Bridget Cortez is a 67 y.o. female who is here for a follow up visit regarding her abscess.  She reports minimal drainage.  Objective: Filed Vitals:   04/03/12 1449  BP: 122/82  Pulse: 80  Temp: 98.6 F (37 C)  Resp: 18    General appearance: alert and cooperative Wound: the area is healing well, min drainage on dressing  Assessment and Plan: Ok to leave open to air, return to normal activities.  Follow up PRN    .Vanita Panda, MD [facility], [name] [phone]

## 2012-04-03 NOTE — Patient Instructions (Signed)
Return to normal activities

## 2012-04-08 DIAGNOSIS — L0291 Cutaneous abscess, unspecified: Secondary | ICD-10-CM | POA: Diagnosis not present

## 2012-04-08 DIAGNOSIS — L723 Sebaceous cyst: Secondary | ICD-10-CM | POA: Diagnosis not present

## 2012-04-08 DIAGNOSIS — L738 Other specified follicular disorders: Secondary | ICD-10-CM | POA: Diagnosis not present

## 2012-04-23 DIAGNOSIS — M25569 Pain in unspecified knee: Secondary | ICD-10-CM | POA: Diagnosis not present

## 2012-05-07 DIAGNOSIS — M25569 Pain in unspecified knee: Secondary | ICD-10-CM | POA: Diagnosis not present

## 2012-06-04 DIAGNOSIS — I10 Essential (primary) hypertension: Secondary | ICD-10-CM | POA: Diagnosis not present

## 2012-06-04 DIAGNOSIS — R1084 Generalized abdominal pain: Secondary | ICD-10-CM | POA: Diagnosis not present

## 2012-06-04 DIAGNOSIS — R630 Anorexia: Secondary | ICD-10-CM | POA: Diagnosis not present

## 2012-06-05 DIAGNOSIS — K5730 Diverticulosis of large intestine without perforation or abscess without bleeding: Secondary | ICD-10-CM | POA: Diagnosis not present

## 2012-06-05 DIAGNOSIS — K5732 Diverticulitis of large intestine without perforation or abscess without bleeding: Secondary | ICD-10-CM | POA: Diagnosis not present

## 2012-06-17 ENCOUNTER — Encounter (HOSPITAL_COMMUNITY): Payer: Self-pay

## 2012-06-17 DIAGNOSIS — Z87728 Personal history of other specified (corrected) congenital malformations of nervous system and sense organs: Secondary | ICD-10-CM | POA: Diagnosis not present

## 2012-06-17 DIAGNOSIS — J45909 Unspecified asthma, uncomplicated: Secondary | ICD-10-CM | POA: Diagnosis not present

## 2012-06-17 DIAGNOSIS — Z79899 Other long term (current) drug therapy: Secondary | ICD-10-CM | POA: Insufficient documentation

## 2012-06-17 DIAGNOSIS — R011 Cardiac murmur, unspecified: Secondary | ICD-10-CM | POA: Diagnosis not present

## 2012-06-17 DIAGNOSIS — R197 Diarrhea, unspecified: Secondary | ICD-10-CM | POA: Insufficient documentation

## 2012-06-17 DIAGNOSIS — IMO0002 Reserved for concepts with insufficient information to code with codable children: Secondary | ICD-10-CM | POA: Diagnosis not present

## 2012-06-17 DIAGNOSIS — Z9889 Other specified postprocedural states: Secondary | ICD-10-CM | POA: Insufficient documentation

## 2012-06-17 DIAGNOSIS — Z87891 Personal history of nicotine dependence: Secondary | ICD-10-CM | POA: Diagnosis not present

## 2012-06-17 DIAGNOSIS — E876 Hypokalemia: Secondary | ICD-10-CM | POA: Diagnosis not present

## 2012-06-17 DIAGNOSIS — Z8544 Personal history of malignant neoplasm of other female genital organs: Secondary | ICD-10-CM | POA: Diagnosis not present

## 2012-06-17 DIAGNOSIS — Z8739 Personal history of other diseases of the musculoskeletal system and connective tissue: Secondary | ICD-10-CM | POA: Insufficient documentation

## 2012-06-17 DIAGNOSIS — Z85828 Personal history of other malignant neoplasm of skin: Secondary | ICD-10-CM | POA: Insufficient documentation

## 2012-06-17 DIAGNOSIS — Z860100 Personal history of colon polyps, unspecified: Secondary | ICD-10-CM | POA: Insufficient documentation

## 2012-06-17 DIAGNOSIS — Z8742 Personal history of other diseases of the female genital tract: Secondary | ICD-10-CM | POA: Diagnosis not present

## 2012-06-17 DIAGNOSIS — K5732 Diverticulitis of large intestine without perforation or abscess without bleeding: Secondary | ICD-10-CM | POA: Insufficient documentation

## 2012-06-17 DIAGNOSIS — K5730 Diverticulosis of large intestine without perforation or abscess without bleeding: Secondary | ICD-10-CM | POA: Diagnosis not present

## 2012-06-17 DIAGNOSIS — Z90710 Acquired absence of both cervix and uterus: Secondary | ICD-10-CM | POA: Insufficient documentation

## 2012-06-17 DIAGNOSIS — K7689 Other specified diseases of liver: Secondary | ICD-10-CM | POA: Diagnosis not present

## 2012-06-17 DIAGNOSIS — Z8619 Personal history of other infectious and parasitic diseases: Secondary | ICD-10-CM | POA: Insufficient documentation

## 2012-06-17 DIAGNOSIS — I10 Essential (primary) hypertension: Secondary | ICD-10-CM | POA: Diagnosis not present

## 2012-06-17 LAB — CBC WITH DIFFERENTIAL/PLATELET
Basophils Relative: 2 % — ABNORMAL HIGH (ref 0–1)
Eosinophils Absolute: 0.1 10*3/uL (ref 0.0–0.7)
HCT: 36.4 % (ref 36.0–46.0)
Hemoglobin: 13.1 g/dL (ref 12.0–15.0)
MCH: 28.9 pg (ref 26.0–34.0)
MCHC: 36 g/dL (ref 30.0–36.0)
Monocytes Absolute: 0 10*3/uL — ABNORMAL LOW (ref 0.1–1.0)
Monocytes Relative: 1 % — ABNORMAL LOW (ref 3–12)
Neutro Abs: 1.3 10*3/uL — ABNORMAL LOW (ref 1.7–7.7)

## 2012-06-17 LAB — COMPREHENSIVE METABOLIC PANEL
Albumin: 3.7 g/dL (ref 3.5–5.2)
BUN: 7 mg/dL (ref 6–23)
Chloride: 86 mEq/L — ABNORMAL LOW (ref 96–112)
Creatinine, Ser: 0.6 mg/dL (ref 0.50–1.10)
GFR calc Af Amer: >90 mL/min (ref >90)
Total Bilirubin: 0.3 mg/dL (ref 0.3–1.2)

## 2012-06-17 NOTE — ED Notes (Addendum)
Pt c/o lower abd pain and diarrhea starting this afternoon. Pt reports 5 BM's since eating this evening. Pt reports she was dx with Diverticulitis 06/05/2012. Pt is currently taking Flagyl 500mg  BID and Cipro

## 2012-06-17 NOTE — ED Notes (Signed)
NURSE FIRST ROUNDS :  NURSE EXPLAINED DELAY , WAIT TIME, AND PROCESS , RESPIRATIONS UNLABORED / DENIES PAIN AT THIS TIME .

## 2012-06-18 ENCOUNTER — Emergency Department (HOSPITAL_COMMUNITY)
Admission: EM | Admit: 2012-06-18 | Discharge: 2012-06-18 | Disposition: A | Discharge status: Home / Self Care | Payer: Medicare Other | Attending: Emergency Medicine | Admitting: Emergency Medicine

## 2012-06-18 ENCOUNTER — Emergency Department (HOSPITAL_COMMUNITY): Payer: Medicare Other

## 2012-06-18 ENCOUNTER — Encounter (HOSPITAL_COMMUNITY): Payer: Self-pay | Admitting: Radiology

## 2012-06-18 DIAGNOSIS — E876 Hypokalemia: Secondary | ICD-10-CM

## 2012-06-18 DIAGNOSIS — R197 Diarrhea, unspecified: Secondary | ICD-10-CM

## 2012-06-18 DIAGNOSIS — K5792 Diverticulitis of intestine, part unspecified, without perforation or abscess without bleeding: Secondary | ICD-10-CM

## 2012-06-18 DIAGNOSIS — K7689 Other specified diseases of liver: Secondary | ICD-10-CM | POA: Diagnosis not present

## 2012-06-18 DIAGNOSIS — K5730 Diverticulosis of large intestine without perforation or abscess without bleeding: Secondary | ICD-10-CM | POA: Diagnosis not present

## 2012-06-18 LAB — URINALYSIS, ROUTINE W REFLEX MICROSCOPIC
Glucose, UA: NEGATIVE mg/dL
Leukocytes, UA: NEGATIVE
Nitrite: NEGATIVE
Protein, ur: NEGATIVE mg/dL
Urobilinogen, UA: 0.2 mg/dL (ref 0.0–1.0)

## 2012-06-18 MED ORDER — SODIUM CHLORIDE 0.9 % IV SOLN
1000.0000 mL | Freq: Once | INTRAVENOUS | Status: AC
  Administered 2012-06-18: 1000 mL via INTRAVENOUS

## 2012-06-18 MED ORDER — SODIUM CHLORIDE 0.9 % IV SOLN: 1000.0000 mL | INTRAVENOUS | Status: DC

## 2012-06-18 MED ORDER — IOHEXOL 300 MG/ML  SOLN
100.0000 mL | Freq: Once | INTRAMUSCULAR | Status: AC | PRN
  Administered 2012-06-18: 100 mL via INTRAVENOUS

## 2012-06-18 MED ORDER — POTASSIUM CHLORIDE CRYS ER 20 MEQ PO TBCR: 20.0000 meq | EXTENDED_RELEASE_TABLET | Freq: Two times a day (BID) | ORAL | Status: DC

## 2012-06-18 MED ORDER — POTASSIUM CHLORIDE CRYS ER 20 MEQ PO TBCR
40.0000 meq | EXTENDED_RELEASE_TABLET | Freq: Once | ORAL | Status: AC
  Administered 2012-06-18: 40 meq via ORAL
  Filled 2012-06-18: qty 2

## 2012-06-18 MED ORDER — HYDROCODONE-ACETAMINOPHEN 5-325 MG PO TABS: ORAL_TABLET | ORAL | Status: DC

## 2012-06-18 MED ORDER — IOHEXOL 300 MG/ML  SOLN
20.0000 mL | INTRAMUSCULAR | Status: AC
  Administered 2012-06-18: 50 mL via ORAL

## 2012-06-18 NOTE — ED Provider Notes (Signed)
History     CSN: [id number]  Arrival date & time 06/17/12  2115   First MD Initiated Contact with Patient 06/18/12 0041      Chief Complaint  Patient presents with  . Abdominal Pain    (Consider location/radiation/quality/duration/timing/severity/associated sxs/prior treatment) HPI Patient reports she was diagnosed with diverticulitis on May 14th by outpatient MRI when she was having lower abdominal pain without diarrhea or fever. She was started on Cipro and Flagyl and reported she was unable to tolerate the Flagyl 3 times a day and was only taking it twice a day because it caused headaches, lightheadedness and dizziness. She finished the last dose of antibiotics on 525. She reports tonight they ate out and she ate steamed fish, carrots, and later in the evening she started having lower abdominal pain. She states she went to the bathroom and had 2 normal bowel movements, followed by 2 loose bowel movements at home. She relates they were coming to the hospital and they had to stop twice for her to have diarrhea. She denies any rectal bleeding, nausea, vomiting, feeling dizzy, lightheadedness, or fever. She states yesterday and today she's felt like she's had reflux when she eats. She also reports she had a frontal headache earlier this evening but it's getting better now. She states she's had it before.  PCP Dr Felipa Eth  Past Medical History  Diagnosis Date  . Asthma   . Multiple allergies   . Chiari malformation   . Heart murmur   . Hypertension   . H/O varicella   . Yeast infection   . Ovarian cyst   . Cancer 2007    breast ,left  . 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 H/O osteopenia     08/2006  . Monilial vaginitis 07/2007  . 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 Abnormal Pap smear 2006    vaginal medicine according to patient  . Personal history of colonic adenomas 03/20/2012    Past Surgical History  Procedure Laterality Date  . Hernia repair    . Abdominal  surgery    . Knee surgery      right  . Abdominal hysterectomy    . Breast surgery      Lumpectomy, left breast    Family History  Problem Relation Age of Onset  . Cancer Mother   . Heart failure Other   . Colon cancer Neg Hx   . Esophageal cancer Neg Hx   . Rectal cancer Neg Hx   . Stomach cancer Neg Hx     History  Substance Use Topics  . Smoking status: Former Smoker    Quit date: 01/23/1978  . Smokeless tobacco: Never Used  . Alcohol Use: No   lives at home This with spouse  OB History   Grav Para Term Preterm Abortions TAB SAB Ect Mult Living   0 0  0      1     Obstetric Comments   PT HAS A ADOPTED CHILD GIRL      Review of Systems  All other systems reviewed and are negative.    Allergies  Ephedrine  Home Medications   Current Outpatient Rx  Name  Route  Sig  Dispense  Refill  . albuterol (PROVENTIL) (2.5 MG/3ML) 0.083% nebulizer solution   Nebulization   Take 2.5 mg by nebulization every 6 (six) hours as needed. For shortness of breath         . amLODipine (NORVASC) 10 MG tablet  Oral   Take 10 mg by mouth every evening.          . Calcium Carbonate-Vitamin D (CALTRATE 600+D PO)   Oral   Take 1 tablet by mouth daily.         . celecoxib (CELEBREX) 200 MG capsule   Oral   Take 200 mg by mouth daily as needed. For arthritis pain         . cetirizine (ZYRTEC) 10 MG tablet   Oral   Take 10 mg by mouth daily.         Marland Kitchen esomeprazole (NEXIUM) 40 MG capsule   Oral   Take 40 mg by mouth daily before breakfast.         . fluticasone (FLONASE) 50 MCG/ACT nasal spray   Nasal   Place 1 spray into the nose daily.          . Fluticasone-Salmeterol (ADVAIR) 250-50 MCG/DOSE AEPB   Inhalation   Inhale 1 puff into the lungs every 12 (twelve) hours.         Marland Kitchen losartan (COZAAR) 50 MG tablet   Oral   Take 50 mg by mouth daily.         . Multiple Vitamin (MULTIVITAMIN WITH MINERALS) TABS   Oral   Take 1 tablet by mouth daily.          . potassium chloride SA (K-DUR,KLOR-CON) 20 MEQ tablet   Oral   Take 60 mEq by mouth daily.         . simvastatin (ZOCOR) 20 MG tablet   Oral   Take 20 mg by mouth every evening.         . triamcinolone cream (KENALOG) 0.1 %   Topical   Apply 1 application topically as needed.         . triamterene-hydrochlorothiazide (MAXZIDE) 75-50 MG per tablet   Oral   Take 1 tablet by mouth daily.           BP 126/69  Pulse 75  Temp(Src) 98.4 F (36.9 C) (Oral)  Resp 19  Wt 254 lb (115.214 kg)  BMI 37.49 kg/m2  SpO2 99% Vital signs normal    Physical Exam  Nursing note and vitals reviewed. Constitutional: She is oriented to person, place, and time. She appears well-developed and well-nourished.  Non-toxic appearance. She does not appear ill. No distress.  HENT:  Head: Normocephalic and atraumatic.  Right Ear: External ear normal.  Left Ear: External ear normal.  Nose: Nose normal. No mucosal edema or rhinorrhea.  Mouth/Throat: Oropharynx is clear and moist and mucous membranes are normal. No dental abscesses or edematous.  Eyes: Conjunctivae and EOM are normal. Pupils are equal, round, and reactive to light.  Neck: Normal range of motion and full passive range of motion without pain. Neck supple.  Cardiovascular: Normal rate, regular rhythm and normal heart sounds.  Exam reveals no gallop and no friction rub.   No murmur heard. Pulmonary/Chest: Effort normal and breath sounds normal. No respiratory distress. She has no wheezes. She has no rhonchi. She has no rales. She exhibits no tenderness and no crepitus.  Abdominal: Soft. Normal appearance and bowel sounds are normal. She exhibits no distension. There is tenderness. There is no rebound and no guarding.  Patient has inconsistent tenderness of her lower abdomen. She has no guarding or rebound  Musculoskeletal: Normal range of motion. She exhibits no edema and no tenderness.  Moves all extremities well.    Neurological: She is  alert and oriented to person, place, and time. She has normal strength. No cranial nerve deficit.  Skin: Skin is warm, dry and intact. No rash noted. No erythema. No pallor.  Psychiatric: She has a normal mood and affect. Her speech is normal and behavior is normal. Her mood appears not anxious.    ED Course  Procedures (including critical care time) Medications  0.9 %  sodium chloride infusion (0 mLs Intravenous Stopped 06/18/12 0313)    Followed by  0.9 %  sodium chloride infusion (not administered)  iohexol (OMNIPAQUE) 300 MG/ML solution 20 mL (50 mLs Oral Contrast Given 06/18/12 0133)  potassium chloride SA (K-DUR,KLOR-CON) CR tablet 40 mEq (40 mEq Oral Given 06/18/12 0206)  iohexol (OMNIPAQUE) 300 MG/ML solution 100 mL (100 mLs Intravenous Contrast Given 06/18/12 0244)   PT has had no further episodes of diarrhea while in the ED. Her abdomen is re examined and she doesn't have tenderness to palpation. Her CT shows very mild diverticulitis which is consistent with her having been treated for diverticulitis and if at the end of her medical therapy. Patient is concerned about what type of diet she should follow while she has a diverticulitis and then when she just has diverticulosis and patient was given discharge instructions to that effect. Patient seemed able to go home after getting her IV fluids.  Results for orders placed during the hospital encounter of 06/18/12  CBC WITH DIFFERENTIAL      Result Value Range   WBC 2.3 (*) 4.0 - 10.5 K/uL   RBC 4.54  3.87 - 5.11 MIL/uL   Hemoglobin 13.1  12.0 - 15.0 g/dL   HCT 16.1  09.6 - 04.5 %   MCV 80.2  78.0 - 100.0 fL   MCH 28.9  26.0 - 34.0 pg   MCHC 36.0  30.0 - 36.0 g/dL   RDW 40.9  81.1 - 91.4 %   Platelets 285  150 - 400 K/uL   Neutrophils Relative % 57  43 - 77 %   Neutro Abs 1.3 (*) 1.7 - 7.7 K/uL   Lymphocytes Relative 36  12 - 46 %   Lymphs Abs 0.8  0.7 - 4.0 K/uL   Monocytes Relative 1 (*) 3 - 12 %    Monocytes Absolute 0.0 (*) 0.1 - 1.0 K/uL   Eosinophils Relative 4  0 - 5 %   Eosinophils Absolute 0.1  0.0 - 0.7 K/uL   Basophils Relative 2 (*) 0 - 1 %   Basophils Absolute 0.0  0.0 - 0.1 K/uL  COMPREHENSIVE METABOLIC PANEL      Result Value Range   Sodium 123 (*) 135 - 145 mEq/L   Potassium 3.0 (*) 3.5 - 5.1 mEq/L   Chloride 86 (*) 96 - 112 mEq/L   CO2 26  19 - 32 mEq/L   Glucose, Bld 108 (*) 70 - 99 mg/dL   BUN 7  6 - 23 mg/dL   Creatinine, Ser 7.82  0.50 - 1.10 mg/dL   Calcium 9.1  8.4 - 95.6 mg/dL   Total Protein 7.0  6.0 - 8.3 g/dL   Albumin 3.7  3.5 - 5.2 g/dL   AST 37  0 - 37 U/L   ALT 35  0 - 35 U/L   Alkaline Phosphatase 73  39 - 117 U/L   Total Bilirubin 0.3  0.3 - 1.2 mg/dL   GFR calc non Af Amer >90  >90 mL/min   GFR calc Af Amer >90  >90  mL/min  URINALYSIS, ROUTINE W REFLEX MICROSCOPIC      Result Value Range   Color, Urine YELLOW  YELLOW   APPearance CLEAR  CLEAR   Specific Gravity, Urine 1.004 (*) 1.005 - 1.030   pH 7.0  5.0 - 8.0   Glucose, UA NEGATIVE  NEGATIVE mg/dL   Hgb urine dipstick NEGATIVE  NEGATIVE   Bilirubin Urine NEGATIVE  NEGATIVE   Ketones, ur NEGATIVE  NEGATIVE mg/dL   Protein, ur NEGATIVE  NEGATIVE mg/dL   Urobilinogen, UA 0.2  0.0 - 1.0 mg/dL   Nitrite NEGATIVE  NEGATIVE   Leukocytes, UA NEGATIVE  NEGATIVE   Laboratory interpretation all normal except for hypokalemia, mild hyponatremia, low chloride all consistent with dehydration from diarrhea, patient has low total white blood cell count without neutropenia    Ct Abdomen Pelvis W Contrast  06/18/2012   *RADIOLOGY REPORT*  Clinical Data: Lower abdominal pain and diarrhea.  CT ABDOMEN AND PELVIS WITH CONTRAST  Technique:  Multidetector CT imaging of the abdomen and pelvis was performed following the standard protocol during bolus administration of intravenous contrast.  Contrast: OMNIPAQUE IOHEXOL 300 MG/ML  SOLN  Comparison: CT of the abdomen and pelvis performed 04/11/2011   Findings: The visualized lung bases are clear.  Scattered hepatic cysts are again noted.  Scattered calcified granulomata are seen in the liver.  The liver is otherwise unremarkable in appearance.  Scattered calcified granulomata are also seen within the spleen.  The gallbladder is within normal limits.  The pancreas and adrenal glands are unremarkable.  The kidneys are unremarkable in appearance.  There is no evidence of hydronephrosis.  No renal or ureteral stones are seen.  No perinephric stranding is appreciated.  The small bowel is unremarkable in appearance.  The stomach is within normal limits.  No acute vascular abnormalities are seen.  The appendix is not well characterized; there is no evidence of appendicitis. Scattered diverticulosis is noted along the distal descending and sigmoid colon.  There is mild soft tissue inflammation along the mid sigmoid colon, which could reflect mild diverticulitis.  This appears to be new from the prior study.  There is no evidence of perforation or abscess formation.  Trace free fluid is seen.  The bladder is mildly distended and grossly unremarkable.  A high- density focus is again noted at the vaginal cuff; the patient is status post hysterectomy.  No suspicious adnexal masses are seen. No inguinal lymphadenopathy is seen.  No acute osseous abnormalities are identified.  Multilevel vacuum phenomenon and disc space narrowing are noted along the lumbar spine, with associated endplate sclerotic change.  IMPRESSION:  1.  Suspect mild diverticulitis at the mid sigmoid colon, with trace free fluid and mild soft tissue inflammation. 2.  Diverticulosis noted along the distal descending and sigmoid colon.  3.  Scattered hepatic cysts again noted. 4.  Degenerative change noted along the lumbar spine.   Original Report Authenticated By: Tonia Ghent, M.D.     1. Diverticulitis   2. Diarrhea    Discharge Medication List as of 06/18/2012  5:59 AM    START taking these  medications   Details  HYDROcodone-acetaminophen (NORCO/VICODIN) 5-325 MG per tablet Take 1 or 2 po Q 6hrs for pain, Print        Plan discharge  Devoria Albe, MD, Armando Gang    MDM           Ward Givens, MD 06/18/12 [id number]

## 2012-06-19 DIAGNOSIS — K5732 Diverticulitis of large intestine without perforation or abscess without bleeding: Secondary | ICD-10-CM | POA: Diagnosis not present

## 2012-06-19 DIAGNOSIS — Z6838 Body mass index (BMI) 38.0-38.9, adult: Secondary | ICD-10-CM | POA: Diagnosis not present

## 2012-06-19 DIAGNOSIS — E876 Hypokalemia: Secondary | ICD-10-CM | POA: Diagnosis not present

## 2012-06-19 DIAGNOSIS — H40029 Open angle with borderline findings, high risk, unspecified eye: Secondary | ICD-10-CM | POA: Diagnosis not present

## 2012-06-19 DIAGNOSIS — H524 Presbyopia: Secondary | ICD-10-CM | POA: Diagnosis not present

## 2012-06-19 DIAGNOSIS — H2510 Age-related nuclear cataract, unspecified eye: Secondary | ICD-10-CM | POA: Diagnosis not present

## 2012-06-19 DIAGNOSIS — H04129 Dry eye syndrome of unspecified lacrimal gland: Secondary | ICD-10-CM | POA: Diagnosis not present

## 2012-06-19 DIAGNOSIS — K5730 Diverticulosis of large intestine without perforation or abscess without bleeding: Secondary | ICD-10-CM | POA: Diagnosis not present

## 2012-06-19 DIAGNOSIS — D72819 Decreased white blood cell count, unspecified: Secondary | ICD-10-CM | POA: Diagnosis not present

## 2012-06-21 ENCOUNTER — Encounter: Payer: Self-pay | Admitting: Internal Medicine

## 2012-06-21 ENCOUNTER — Ambulatory Visit (INDEPENDENT_AMBULATORY_CARE_PROVIDER_SITE_OTHER): Payer: Medicare Other | Admitting: Internal Medicine

## 2012-06-21 VITALS — BP 120/82 | HR 86 | Ht 69.0 in | Wt 241.2 lb

## 2012-06-21 DIAGNOSIS — K5732 Diverticulitis of large intestine without perforation or abscess without bleeding: Secondary | ICD-10-CM

## 2012-06-21 DIAGNOSIS — K219 Gastro-esophageal reflux disease without esophagitis: Secondary | ICD-10-CM | POA: Diagnosis not present

## 2012-06-21 DIAGNOSIS — R197 Diarrhea, unspecified: Secondary | ICD-10-CM | POA: Diagnosis not present

## 2012-06-21 NOTE — Patient Instructions (Addendum)
Today we are providing you with GERD diet info and also Diverticulosis information to read and follow.    Eat Parke Sim[place], Baked foods and as you improve you may gradually increase your choices.   I appreciate the opportunity to care for you.

## 2012-06-21 NOTE — Progress Notes (Signed)
Subjective:    Patient ID: Bridget Cortez, female    DOB: 24-Dec-1945, 67 y.o.   MRN: [id number]  HPI Recent diverticulitis diagnosed at an outside imaging center. She took Cipro and metronidazole for about 10 days. She was better. On Memorial Day she was eating a cracker barrel. Later that night she developed severe lower quadrant abdominal pain and profuse recurrent diarrhea. She was evaluated in the emergency department where a CT scan showed some stranding around the colon in the sigmoid where there is diverticulosis. She rapidly improved and diarrhea resolved by the next day. She is quite concerned that if she eats seeds, other foods she may trigger another problem with diverticulitis or diarrhea. She openly admits she is better but she is fearful of returning to a regular diet.  She believes she is lactose intolerant on a regular basis.  She is having some heartburn lately despite taking Nexium and is very concerned about that exacerbation and wants to know what foods to avoid with respect to acid reflux.  Allergies  Allergen Reactions  . Ephedrine Shortness Of Breath   Outpatient Prescriptions Prior to Visit  Medication Sig Dispense Refill  . albuterol (PROVENTIL) (2.5 MG/3ML) 0.083% nebulizer solution Take 2.5 mg by nebulization every 6 (six) hours as needed. For shortness of breath      . amLODipine (NORVASC) 10 MG tablet Take 10 mg by mouth every evening.       . Calcium Carbonate-Vitamin D (CALTRATE 600+D PO) Take 1 tablet by mouth daily.      . celecoxib (CELEBREX) 200 MG capsule Take 200 mg by mouth daily as needed. For arthritis pain      . cetirizine (ZYRTEC) 10 MG tablet Take 10 mg by mouth daily.      Marland Kitchen esomeprazole (NEXIUM) 40 MG capsule Take 40 mg by mouth daily before breakfast.      . fluticasone (FLONASE) 50 MCG/ACT nasal spray Place 1 spray into the nose daily.       . Fluticasone-Salmeterol (ADVAIR) 250-50 MCG/DOSE AEPB Inhale 1 puff into the lungs every 12 (twelve)  hours.      Marland Kitchen HYDROcodone-acetaminophen (NORCO/VICODIN) 5-325 MG per tablet Take 1 or 2 po Q 6hrs for pain  10 tablet  0  . losartan (COZAAR) 50 MG tablet Take 50 mg by mouth daily.      . potassium chloride SA (K-DUR,KLOR-CON) 20 MEQ tablet Take 60 mEq by mouth daily.      . simvastatin (ZOCOR) 20 MG tablet Take 20 mg by mouth every evening.      . triamcinolone cream (KENALOG) 0.1 % Apply 1 application topically as needed.      . triamterene-hydrochlorothiazide (MAXZIDE) 75-50 MG per tablet Take 1 tablet by mouth daily.      . potassium chloride SA (K-DUR,KLOR-CON) 20 MEQ tablet Take 1 tablet (20 mEq total) by mouth 2 (two) times daily.  10 tablet  0  . Multiple Vitamin (MULTIVITAMIN WITH MINERALS) TABS Take 1 tablet by mouth daily.       No facility-administered medications prior to visit.   Past Medical History  Diagnosis Date  . Asthma   . Multiple allergies   . Chiari malformation   . Heart murmur   . Hypertension   . H/O varicella   . Yeast infection   . Ovarian cyst   . Breast cancer 2007    Left  . 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 H/O osteopenia     08/2006  .  Monilial vaginitis 07/2007  . 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 Abnormal Pap smear 2006    vaginal medicine according to patient  . Personal history of colonic adenomas 03/13/2012  . Diverticulosis    Past Surgical History  Procedure Laterality Date  . Hernia repair    . Abdominal surgery    . Knee surgery      right  . Abdominal hysterectomy    . Breast surgery      Lumpectomy, left breast  . Colonoscopy w/ biopsies     History   Social History  . Marital Status: Married    Spouse Name: N/A    Number of Children: N/A  . Years of Education: N/A   Social History Main Topics  . Smoking status: Former Smoker    Quit date: 01/23/1978  . Smokeless tobacco: Never Used  . Alcohol Use: No  . Drug Use: No  . Sexually Active: Yes    Birth Control/ Protection: Surgical   Review of  Systems + leg cramps    Objective:   Physical Exam WDWN Obese Abdomen soft and non-tender  I reviewed the emergency room visit from 527, CT scan results. I have results. Lab Results  Component Value Date   WBC 2.3* 06/17/2012   HGB 13.1 06/17/2012   HCT 36.4 06/17/2012   MCV 80.2 06/17/2012   PLT 285 06/17/2012     Chemistry      Component Value Date/Time   NA 123* 06/17/2012 2140   K 3.0* 06/17/2012 2140   CL 86* 06/17/2012 2140   CO2 26 06/17/2012 2140   BUN 7 06/17/2012 2140   CREATININE 0.60 06/17/2012 2140      Component Value Date/Time   CALCIUM 9.1 06/17/2012 2140   ALKPHOS 73 06/17/2012 2140   AST 37 06/17/2012 2140   ALT 35 06/17/2012 2140   BILITOT 0.3 06/17/2012 2140         Assessment & Plan:  Diverticulitis of colon (without mention of hemorrhage)  Diarrhea of presumed infectious origin  GERD (gastroesophageal reflux disease)   1. She is clearly improved if not resolved. 2. I have tried to reassure as best I could, I think she should follow a reflux diet and eat some bland foods with respect to recent diarrheal illness and gradually resume normal eating. 3. I will see her back as needed otherwise. No specific followup by me is needed at this point. 4. she is following up with her primary care physician regarding electrolyte abnormalities and they have already been rechecked.  I appreciate the opportunity to care for this patient.  CC: Hoyle Sauer, MD   1.

## 2012-06-27 DIAGNOSIS — L538 Other specified erythematous conditions: Secondary | ICD-10-CM | POA: Diagnosis not present

## 2012-06-27 DIAGNOSIS — L821 Other seborrheic keratosis: Secondary | ICD-10-CM | POA: Diagnosis not present

## 2012-06-28 DIAGNOSIS — M1710 Unilateral primary osteoarthritis, unspecified knee: Secondary | ICD-10-CM | POA: Diagnosis not present

## 2012-07-02 DIAGNOSIS — M1710 Unilateral primary osteoarthritis, unspecified knee: Secondary | ICD-10-CM | POA: Diagnosis not present

## 2012-07-03 DIAGNOSIS — N9089 Other specified noninflammatory disorders of vulva and perineum: Secondary | ICD-10-CM | POA: Diagnosis not present

## 2012-07-03 DIAGNOSIS — L0390 Cellulitis, unspecified: Secondary | ICD-10-CM | POA: Diagnosis not present

## 2012-07-04 DIAGNOSIS — M1710 Unilateral primary osteoarthritis, unspecified knee: Secondary | ICD-10-CM | POA: Diagnosis not present

## 2012-07-10 DIAGNOSIS — M1710 Unilateral primary osteoarthritis, unspecified knee: Secondary | ICD-10-CM | POA: Diagnosis not present

## 2012-07-12 DIAGNOSIS — M1710 Unilateral primary osteoarthritis, unspecified knee: Secondary | ICD-10-CM | POA: Diagnosis not present

## 2012-07-12 DIAGNOSIS — L0291 Cutaneous abscess, unspecified: Secondary | ICD-10-CM | POA: Diagnosis not present

## 2012-07-12 DIAGNOSIS — L0390 Cellulitis, unspecified: Secondary | ICD-10-CM | POA: Diagnosis not present

## 2012-07-16 DIAGNOSIS — M1710 Unilateral primary osteoarthritis, unspecified knee: Secondary | ICD-10-CM | POA: Diagnosis not present

## 2012-07-19 DIAGNOSIS — L0291 Cutaneous abscess, unspecified: Secondary | ICD-10-CM | POA: Diagnosis not present

## 2012-07-19 DIAGNOSIS — M1710 Unilateral primary osteoarthritis, unspecified knee: Secondary | ICD-10-CM | POA: Diagnosis not present

## 2012-07-19 DIAGNOSIS — L0390 Cellulitis, unspecified: Secondary | ICD-10-CM | POA: Diagnosis not present

## 2012-07-23 DIAGNOSIS — M1710 Unilateral primary osteoarthritis, unspecified knee: Secondary | ICD-10-CM | POA: Diagnosis not present

## 2012-07-24 DIAGNOSIS — K5732 Diverticulitis of large intestine without perforation or abscess without bleeding: Secondary | ICD-10-CM | POA: Diagnosis not present

## 2012-07-24 DIAGNOSIS — L0292 Furuncle, unspecified: Secondary | ICD-10-CM | POA: Diagnosis not present

## 2012-07-24 DIAGNOSIS — Z6836 Body mass index (BMI) 36.0-36.9, adult: Secondary | ICD-10-CM | POA: Diagnosis not present

## 2012-07-24 DIAGNOSIS — E876 Hypokalemia: Secondary | ICD-10-CM | POA: Diagnosis not present

## 2012-07-24 DIAGNOSIS — I10 Essential (primary) hypertension: Secondary | ICD-10-CM | POA: Diagnosis not present

## 2012-07-24 DIAGNOSIS — L538 Other specified erythematous conditions: Secondary | ICD-10-CM | POA: Diagnosis not present

## 2012-07-24 DIAGNOSIS — L821 Other seborrheic keratosis: Secondary | ICD-10-CM | POA: Diagnosis not present

## 2012-07-24 DIAGNOSIS — M1990 Unspecified osteoarthritis, unspecified site: Secondary | ICD-10-CM | POA: Diagnosis not present

## 2012-08-29 ENCOUNTER — Other Ambulatory Visit: Payer: Self-pay

## 2012-08-29 DIAGNOSIS — Z1231 Encounter for screening mammogram for malignant neoplasm of breast: Secondary | ICD-10-CM

## 2012-10-04 ENCOUNTER — Ambulatory Visit
Admission: RE | Admit: 2012-10-04 | Discharge: 2012-10-04 | Disposition: A | Discharge status: Home / Self Care | Payer: Medicare Other | Source: Ambulatory Visit

## 2012-10-04 DIAGNOSIS — Z1231 Encounter for screening mammogram for malignant neoplasm of breast: Secondary | ICD-10-CM | POA: Diagnosis not present

## 2012-10-04 DIAGNOSIS — Z853 Personal history of malignant neoplasm of breast: Secondary | ICD-10-CM | POA: Diagnosis not present

## 2012-10-17 DIAGNOSIS — Z23 Encounter for immunization: Secondary | ICD-10-CM | POA: Diagnosis not present

## 2012-12-06 DIAGNOSIS — H40029 Open angle with borderline findings, high risk, unspecified eye: Secondary | ICD-10-CM | POA: Diagnosis not present

## 2012-12-11 ENCOUNTER — Telehealth: Payer: Self-pay | Admitting: Internal Medicine

## 2012-12-11 NOTE — Telephone Encounter (Signed)
Left message for patient to call back  

## 2012-12-11 NOTE — Telephone Encounter (Signed)
Patient c/o excess gas and bloating.  She is advised to try OTC probiotic.  She is also advised that I will send her information on a low gas diet.  She is advised to try phazyme or gas-x also.  She will call back for additional questions or concerns

## 2012-12-18 DIAGNOSIS — E782 Mixed hyperlipidemia: Secondary | ICD-10-CM | POA: Diagnosis not present

## 2012-12-18 DIAGNOSIS — I10 Essential (primary) hypertension: Secondary | ICD-10-CM | POA: Diagnosis not present

## 2012-12-25 DIAGNOSIS — Z853 Personal history of malignant neoplasm of breast: Secondary | ICD-10-CM | POA: Diagnosis not present

## 2012-12-25 DIAGNOSIS — Z124 Encounter for screening for malignant neoplasm of cervix: Secondary | ICD-10-CM | POA: Diagnosis not present

## 2012-12-25 DIAGNOSIS — Z13820 Encounter for screening for osteoporosis: Secondary | ICD-10-CM | POA: Diagnosis not present

## 2012-12-25 DIAGNOSIS — D071 Carcinoma in situ of vulva: Secondary | ICD-10-CM | POA: Diagnosis not present

## 2012-12-27 DIAGNOSIS — Z0000 Encounter for general adult medical examination without abnormal findings: Secondary | ICD-10-CM | POA: Diagnosis not present

## 2012-12-27 DIAGNOSIS — M1990 Unspecified osteoarthritis, unspecified site: Secondary | ICD-10-CM | POA: Diagnosis not present

## 2012-12-27 DIAGNOSIS — J45909 Unspecified asthma, uncomplicated: Secondary | ICD-10-CM | POA: Diagnosis not present

## 2012-12-27 DIAGNOSIS — I10 Essential (primary) hypertension: Secondary | ICD-10-CM | POA: Diagnosis not present

## 2012-12-27 DIAGNOSIS — K5730 Diverticulosis of large intestine without perforation or abscess without bleeding: Secondary | ICD-10-CM | POA: Diagnosis not present

## 2012-12-27 DIAGNOSIS — E782 Mixed hyperlipidemia: Secondary | ICD-10-CM | POA: Diagnosis not present

## 2012-12-27 DIAGNOSIS — J309 Allergic rhinitis, unspecified: Secondary | ICD-10-CM | POA: Diagnosis not present

## 2012-12-27 DIAGNOSIS — K219 Gastro-esophageal reflux disease without esophagitis: Secondary | ICD-10-CM | POA: Diagnosis not present

## 2012-12-31 DIAGNOSIS — Z1212 Encounter for screening for malignant neoplasm of rectum: Secondary | ICD-10-CM | POA: Diagnosis not present

## 2013-03-21 DIAGNOSIS — H02849 Edema of unspecified eye, unspecified eyelid: Secondary | ICD-10-CM | POA: Diagnosis not present

## 2013-03-21 DIAGNOSIS — H01019 Ulcerative blepharitis unspecified eye, unspecified eyelid: Secondary | ICD-10-CM | POA: Diagnosis not present

## 2013-03-21 DIAGNOSIS — B0239 Other herpes zoster eye disease: Secondary | ICD-10-CM | POA: Diagnosis not present

## 2013-03-28 ENCOUNTER — Emergency Department (HOSPITAL_COMMUNITY)
Admission: EM | Admit: 2013-03-28 | Discharge: 2013-03-29 | Disposition: A | Discharge status: Home / Self Care | Payer: Medicare Other | Attending: Emergency Medicine | Admitting: Emergency Medicine

## 2013-03-28 ENCOUNTER — Encounter (HOSPITAL_COMMUNITY): Payer: Self-pay | Admitting: Emergency Medicine

## 2013-03-28 DIAGNOSIS — Z853 Personal history of malignant neoplasm of breast: Secondary | ICD-10-CM | POA: Diagnosis not present

## 2013-03-28 DIAGNOSIS — Z8739 Personal history of other diseases of the musculoskeletal system and connective tissue: Secondary | ICD-10-CM | POA: Diagnosis not present

## 2013-03-28 DIAGNOSIS — Z79899 Other long term (current) drug therapy: Secondary | ICD-10-CM | POA: Diagnosis not present

## 2013-03-28 DIAGNOSIS — Z8719 Personal history of other diseases of the digestive system: Secondary | ICD-10-CM | POA: Diagnosis not present

## 2013-03-28 DIAGNOSIS — Z8742 Personal history of other diseases of the female genital tract: Secondary | ICD-10-CM | POA: Insufficient documentation

## 2013-03-28 DIAGNOSIS — X500XXA Overexertion from strenuous movement or load, initial encounter: Secondary | ICD-10-CM | POA: Insufficient documentation

## 2013-03-28 DIAGNOSIS — Z8619 Personal history of other infectious and parasitic diseases: Secondary | ICD-10-CM | POA: Insufficient documentation

## 2013-03-28 DIAGNOSIS — Z87891 Personal history of nicotine dependence: Secondary | ICD-10-CM | POA: Insufficient documentation

## 2013-03-28 DIAGNOSIS — Q054 Unspecified spina bifida with hydrocephalus: Secondary | ICD-10-CM | POA: Diagnosis not present

## 2013-03-28 DIAGNOSIS — I10 Essential (primary) hypertension: Secondary | ICD-10-CM | POA: Insufficient documentation

## 2013-03-28 DIAGNOSIS — M62830 Muscle spasm of back: Secondary | ICD-10-CM

## 2013-03-28 DIAGNOSIS — IMO0002 Reserved for concepts with insufficient information to code with codable children: Secondary | ICD-10-CM | POA: Diagnosis not present

## 2013-03-28 DIAGNOSIS — M545 Low back pain, unspecified: Secondary | ICD-10-CM | POA: Diagnosis not present

## 2013-03-28 DIAGNOSIS — J45909 Unspecified asthma, uncomplicated: Secondary | ICD-10-CM | POA: Diagnosis not present

## 2013-03-28 DIAGNOSIS — Y929 Unspecified place or not applicable: Secondary | ICD-10-CM | POA: Insufficient documentation

## 2013-03-28 DIAGNOSIS — Y9389 Activity, other specified: Secondary | ICD-10-CM | POA: Insufficient documentation

## 2013-03-28 DIAGNOSIS — R011 Cardiac murmur, unspecified: Secondary | ICD-10-CM | POA: Insufficient documentation

## 2013-03-28 DIAGNOSIS — M5380 Other specified dorsopathies, site unspecified: Secondary | ICD-10-CM | POA: Insufficient documentation

## 2013-03-28 NOTE — ED Notes (Signed)
The pt is c/o lower back pain that started tonight after she bent over and she was unable to straighten upright.  Pain since then

## 2013-03-29 MED ORDER — DIAZEPAM 5 MG PO TABS: 5.0000 mg | ORAL_TABLET | Freq: Two times a day (BID) | ORAL | Status: DC

## 2013-03-29 MED ORDER — DIAZEPAM 5 MG PO TABS
5.0000 mg | ORAL_TABLET | Freq: Once | ORAL | Status: AC
  Administered 2013-03-29: 5 mg via ORAL
  Filled 2013-03-29: qty 1

## 2013-03-29 MED ORDER — KETOROLAC TROMETHAMINE 60 MG/2ML IM SOLN
60.0000 mg | Freq: Once | INTRAMUSCULAR | Status: AC
  Administered 2013-03-29: 60 mg via INTRAMUSCULAR
  Filled 2013-03-29: qty 2

## 2013-03-29 NOTE — Discharge Instructions (Signed)
Take valium every 12 hours as needed for severe back pain. This medication can cause drowsiness. Do not drive while taking.  Be sure to follow up with your primary care provider, Dr. Dagmar Hait next week if symptoms not improving.

## 2013-03-29 NOTE — ED Provider Notes (Signed)
CSN: [id number]     Arrival date & time 03/28/13  2324 History   First MD Initiated Contact with Patient 03/28/13 2348     Chief Complaint  Patient presents with  . Back Pain     (Consider location/radiation/quality/duration/timing/severity/associated sxs/prior Treatment) HPI Pt is a 68yo female c/o right lower back pain that started yesterday around 12:30pm when she bent down to get a notebook. Pt states she was unable to stand up right away due to a "catch" in her back. At that time, pain was sharp and stabbing, 8/10.  No pain is intermittent, aching and sore, 6/10 with occasional "catch" with certain movements. Denies numbness or tingling in arms or legs. Denies change in bowel or bladder habits. Denies falls. Does report fall 2 years ago for which according to medical records indicate she was tx with valium and ibuprofen. To states that medication did help.    Past Medical History  Diagnosis Date  . Asthma   . Multiple allergies   . Chiari malformation   . Heart murmur   . Hypertension   . H/O varicella   . Yeast infection   . Ovarian cyst   . Breast cancer 2007    Left  . 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 H/O osteopenia     08/2006  . Monilial vaginitis 07/2007  . 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 Abnormal Pap smear 2006    vaginal medicine according to patient  . Personal history of colonic adenomas 03/13/2012  . Diverticulosis    Past Surgical History  Procedure Laterality Date  . Hernia repair    . Abdominal surgery    . Knee surgery      right  . Abdominal hysterectomy    . Breast surgery      Lumpectomy, left breast  . Colonoscopy w/ biopsies     Family History  Problem Relation Age of Onset  . Cancer Mother   . Heart failure Other   . Colon cancer Neg Hx   . Esophageal cancer Neg Hx   . Rectal cancer Neg Hx   . Stomach cancer Neg Hx    History  Substance Use Topics  . Smoking status: Former Smoker    Quit date: 01/23/1978  . Smokeless  tobacco: Never Used  . Alcohol Use: No   OB History   Grav Para Term Preterm Abortions TAB SAB Ect Mult Living   0 0  0      1     Obstetric Comments   PT HAS A ADOPTED CHILD GIRL     Review of Systems  Constitutional: Negative for fever and chills.  Gastrointestinal: Negative for nausea, vomiting, abdominal pain and diarrhea.  Musculoskeletal: Positive for back pain and myalgias.  Neurological: Negative for weakness and numbness.  All other systems reviewed and are negative.      Allergies  Ephedrine  Home Medications   Current Outpatient Rx  Name  Route  Sig  Dispense  Refill  . amLODipine (NORVASC) 10 MG tablet   Oral   Take 10 mg by mouth every evening.          . Calcium Carbonate-Vitamin D (CALTRATE 600+D PO)   Oral   Take 1 tablet by mouth daily.         . celecoxib (CELEBREX) 200 MG capsule   Oral   Take 200 mg by mouth 2 (two) times daily as needed for moderate pain.          Marland Kitchen  cetirizine (ZYRTEC) 10 MG tablet   Oral   Take 10 mg by mouth daily.         Marland Kitchen esomeprazole (NEXIUM) 40 MG capsule   Oral   Take 40 mg by mouth daily before breakfast.         . fluticasone (FLONASE) 50 MCG/ACT nasal spray   Nasal   Place 1 spray into the nose at bedtime.          . Fluticasone-Salmeterol (ADVAIR) 250-50 MCG/DOSE AEPB   Inhalation   Inhale 1 puff into the lungs every 12 (twelve) hours.         Marland Kitchen losartan (COZAAR) 50 MG tablet   Oral   Take 50 mg by mouth daily.         . polyethylene glycol (MIRALAX / GLYCOLAX) packet   Oral   Take 17 g by mouth daily as needed for mild constipation or moderate constipation.         . potassium chloride SA (K-DUR,KLOR-CON) 20 MEQ tablet   Oral   Take 60 mEq by mouth daily.         . simvastatin (ZOCOR) 20 MG tablet   Oral   Take 20 mg by mouth every evening.         . triamterene-hydrochlorothiazide (MAXZIDE) 75-50 MG per tablet   Oral   Take 1 tablet by mouth daily.         Marland Kitchen  albuterol (PROVENTIL) (2.5 MG/3ML) 0.083% nebulizer solution   Nebulization   Take 2.5 mg by nebulization every 6 (six) hours as needed. For shortness of breath         . diazepam (VALIUM) 5 MG tablet   Oral   Take 1 tablet (5 mg total) by mouth 2 (two) times daily.   10 tablet   0    BP 123/80  Pulse 67  Temp(Src) 97.8 F (36.6 C) (Oral)  Resp 18  Wt 231 lb 6 oz (104.951 kg)  SpO2 97% Physical Exam  Nursing note and vitals reviewed. Constitutional: She is oriented to person, place, and time. She appears well-developed and well-nourished.  HENT:  Head: Normocephalic and atraumatic.  Eyes: EOM are normal.  Neck: Normal range of motion.  Cardiovascular: Normal rate.   Pulmonary/Chest: Effort normal. No respiratory distress.  Abdominal: Soft. She exhibits no distension. There is no tenderness.  Musculoskeletal: She exhibits tenderness.  Antalgic gait. FROM all 4 extremities.  Tenderness in lumbar musculature on right side.  No midline spinal tenderness, step offs or crepitus.   Neurological: She is alert and oriented to person, place, and time.  Skin: Skin is warm and dry.  Psychiatric: She has a normal mood and affect. Her behavior is normal.    ED Course  Procedures (including critical care time) Labs Review Labs Reviewed - No data to display Imaging Review No results found.   EKG Interpretation None      MDM   Final diagnoses:  Back muscle spasm    pt c/o back pain after feeling a "catch" when she bent down around 12:30pm on 03/28/13.  Pt states pain comes and goes, she is scared she is going to move a certain way and "catch" again.  Reports use of valium and ibuprofen in past for similar pain with relief. Denies falls or recent injury. No red flag symptoms.  Do not believe imaging needed at this time. Not concerned for emergent process taking place. Will tx symptomatically as needed for pain.  Tx  in ED: valium and toradol.  Rx: valium as pt has Celebrex at  home. Advised to f/u with PCP, Dr. Dagmar Hait on Monday if symptoms not improving. Pt education on back pain provided. Return precautions provided. Pt verbalized understanding and agreement with tx plan.      Noland Fordyce, [name] 03/29/13 [phone]

## 2013-03-30 NOTE — ED Provider Notes (Signed)
Medical screening examination/treatment/procedure(s) were performed by non-physician practitioner and as supervising physician I was immediately available for consultation/collaboration.   Teressa Lower, MD 03/30/13 [phone]

## 2013-04-15 DIAGNOSIS — H01019 Ulcerative blepharitis unspecified eye, unspecified eyelid: Secondary | ICD-10-CM | POA: Diagnosis not present

## 2013-04-15 DIAGNOSIS — H02849 Edema of unspecified eye, unspecified eyelid: Secondary | ICD-10-CM | POA: Diagnosis not present

## 2013-04-15 DIAGNOSIS — H40029 Open angle with borderline findings, high risk, unspecified eye: Secondary | ICD-10-CM | POA: Diagnosis not present

## 2013-04-15 DIAGNOSIS — B0239 Other herpes zoster eye disease: Secondary | ICD-10-CM | POA: Diagnosis not present

## 2013-05-12 ENCOUNTER — Other Ambulatory Visit: Payer: Self-pay | Admitting: Neurosurgery

## 2013-05-12 DIAGNOSIS — R519 Headache, unspecified: Secondary | ICD-10-CM

## 2013-05-12 DIAGNOSIS — R51 Headache: Principal | ICD-10-CM

## 2013-05-17 ENCOUNTER — Other Ambulatory Visit: Payer: Medicare Other

## 2013-05-17 ENCOUNTER — Ambulatory Visit
Admission: RE | Admit: 2013-05-17 | Discharge: 2013-05-17 | Disposition: A | Discharge status: Home / Self Care | Payer: Medicare Other | Source: Ambulatory Visit | Attending: Neurosurgery | Admitting: Neurosurgery

## 2013-05-17 DIAGNOSIS — M5020 Other cervical disc displacement, unspecified cervical region: Secondary | ICD-10-CM | POA: Diagnosis not present

## 2013-05-17 DIAGNOSIS — M47812 Spondylosis without myelopathy or radiculopathy, cervical region: Secondary | ICD-10-CM | POA: Diagnosis not present

## 2013-05-17 DIAGNOSIS — R51 Headache: Principal | ICD-10-CM

## 2013-05-17 DIAGNOSIS — R519 Headache, unspecified: Secondary | ICD-10-CM

## 2013-05-22 DIAGNOSIS — Q054 Unspecified spina bifida with hydrocephalus: Secondary | ICD-10-CM | POA: Diagnosis not present

## 2013-05-22 DIAGNOSIS — Z6833 Body mass index (BMI) 33.0-33.9, adult: Secondary | ICD-10-CM | POA: Diagnosis not present

## 2013-06-05 DIAGNOSIS — H35379 Puckering of macula, unspecified eye: Secondary | ICD-10-CM | POA: Diagnosis not present

## 2013-06-05 DIAGNOSIS — H524 Presbyopia: Secondary | ICD-10-CM | POA: Diagnosis not present

## 2013-06-05 DIAGNOSIS — H25019 Cortical age-related cataract, unspecified eye: Secondary | ICD-10-CM | POA: Diagnosis not present

## 2013-06-05 DIAGNOSIS — H35039 Hypertensive retinopathy, unspecified eye: Secondary | ICD-10-CM | POA: Diagnosis not present

## 2013-06-05 DIAGNOSIS — H40029 Open angle with borderline findings, high risk, unspecified eye: Secondary | ICD-10-CM | POA: Diagnosis not present

## 2013-06-24 DIAGNOSIS — K5730 Diverticulosis of large intestine without perforation or abscess without bleeding: Secondary | ICD-10-CM | POA: Diagnosis not present

## 2013-06-24 DIAGNOSIS — J309 Allergic rhinitis, unspecified: Secondary | ICD-10-CM | POA: Diagnosis not present

## 2013-06-24 DIAGNOSIS — J45909 Unspecified asthma, uncomplicated: Secondary | ICD-10-CM | POA: Diagnosis not present

## 2013-06-24 DIAGNOSIS — R634 Abnormal weight loss: Secondary | ICD-10-CM | POA: Diagnosis not present

## 2013-06-24 DIAGNOSIS — I10 Essential (primary) hypertension: Secondary | ICD-10-CM | POA: Diagnosis not present

## 2013-06-24 DIAGNOSIS — F411 Generalized anxiety disorder: Secondary | ICD-10-CM | POA: Diagnosis not present

## 2013-06-24 DIAGNOSIS — K219 Gastro-esophageal reflux disease without esophagitis: Secondary | ICD-10-CM | POA: Diagnosis not present

## 2013-06-24 DIAGNOSIS — M1990 Unspecified osteoarthritis, unspecified site: Secondary | ICD-10-CM | POA: Diagnosis not present

## 2013-07-22 DIAGNOSIS — L538 Other specified erythematous conditions: Secondary | ICD-10-CM | POA: Diagnosis not present

## 2013-07-22 DIAGNOSIS — L820 Inflamed seborrheic keratosis: Secondary | ICD-10-CM | POA: Diagnosis not present

## 2013-07-22 DIAGNOSIS — D239 Other benign neoplasm of skin, unspecified: Secondary | ICD-10-CM | POA: Diagnosis not present

## 2013-07-22 DIAGNOSIS — L821 Other seborrheic keratosis: Secondary | ICD-10-CM | POA: Diagnosis not present

## 2013-07-22 DIAGNOSIS — D2370 Other benign neoplasm of skin of unspecified lower limb, including hip: Secondary | ICD-10-CM | POA: Diagnosis not present

## 2013-08-19 DIAGNOSIS — D72819 Decreased white blood cell count, unspecified: Secondary | ICD-10-CM | POA: Diagnosis not present

## 2013-09-04 ENCOUNTER — Other Ambulatory Visit: Payer: Self-pay

## 2013-09-04 DIAGNOSIS — Z853 Personal history of malignant neoplasm of breast: Secondary | ICD-10-CM

## 2013-09-04 DIAGNOSIS — Z1231 Encounter for screening mammogram for malignant neoplasm of breast: Secondary | ICD-10-CM

## 2013-09-23 DIAGNOSIS — H01019 Ulcerative blepharitis unspecified eye, unspecified eyelid: Secondary | ICD-10-CM | POA: Diagnosis not present

## 2013-09-23 DIAGNOSIS — H0019 Chalazion unspecified eye, unspecified eyelid: Secondary | ICD-10-CM | POA: Diagnosis not present

## 2013-10-07 ENCOUNTER — Ambulatory Visit
Admission: RE | Admit: 2013-10-07 | Discharge: 2013-10-07 | Disposition: A | Discharge status: Home / Self Care | Payer: Medicare Other | Source: Ambulatory Visit

## 2013-10-07 ENCOUNTER — Encounter (INDEPENDENT_AMBULATORY_CARE_PROVIDER_SITE_OTHER): Payer: Self-pay

## 2013-10-07 DIAGNOSIS — Z1231 Encounter for screening mammogram for malignant neoplasm of breast: Secondary | ICD-10-CM

## 2013-10-07 DIAGNOSIS — Z853 Personal history of malignant neoplasm of breast: Secondary | ICD-10-CM

## 2013-10-20 DIAGNOSIS — Z23 Encounter for immunization: Secondary | ICD-10-CM | POA: Diagnosis not present

## 2013-11-04 DIAGNOSIS — H01006 Unspecified blepharitis left eye, unspecified eyelid: Secondary | ICD-10-CM | POA: Diagnosis not present

## 2013-11-04 DIAGNOSIS — H40013 Open angle with borderline findings, low risk, bilateral: Secondary | ICD-10-CM | POA: Diagnosis not present

## 2013-11-04 DIAGNOSIS — H0015 Chalazion left lower eyelid: Secondary | ICD-10-CM | POA: Diagnosis not present

## 2013-11-24 ENCOUNTER — Encounter (HOSPITAL_COMMUNITY): Payer: Self-pay | Admitting: Emergency Medicine

## 2013-12-10 DIAGNOSIS — H0015 Chalazion left lower eyelid: Secondary | ICD-10-CM | POA: Diagnosis not present

## 2013-12-10 DIAGNOSIS — H40013 Open angle with borderline findings, low risk, bilateral: Secondary | ICD-10-CM | POA: Diagnosis not present

## 2013-12-10 DIAGNOSIS — H01006 Unspecified blepharitis left eye, unspecified eyelid: Secondary | ICD-10-CM | POA: Diagnosis not present

## 2013-12-17 DIAGNOSIS — Z0000 Encounter for general adult medical examination without abnormal findings: Secondary | ICD-10-CM | POA: Diagnosis not present

## 2013-12-17 DIAGNOSIS — I10 Essential (primary) hypertension: Secondary | ICD-10-CM | POA: Diagnosis not present

## 2013-12-17 DIAGNOSIS — E782 Mixed hyperlipidemia: Secondary | ICD-10-CM | POA: Diagnosis not present

## 2013-12-17 DIAGNOSIS — R300 Dysuria: Secondary | ICD-10-CM | POA: Diagnosis not present

## 2013-12-30 DIAGNOSIS — Z1389 Encounter for screening for other disorder: Secondary | ICD-10-CM | POA: Diagnosis not present

## 2013-12-30 DIAGNOSIS — E782 Mixed hyperlipidemia: Secondary | ICD-10-CM | POA: Diagnosis not present

## 2013-12-30 DIAGNOSIS — E876 Hypokalemia: Secondary | ICD-10-CM | POA: Diagnosis not present

## 2013-12-30 DIAGNOSIS — I10 Essential (primary) hypertension: Secondary | ICD-10-CM | POA: Diagnosis not present

## 2013-12-30 DIAGNOSIS — R600 Localized edema: Secondary | ICD-10-CM | POA: Diagnosis not present

## 2013-12-30 DIAGNOSIS — J45909 Unspecified asthma, uncomplicated: Secondary | ICD-10-CM | POA: Diagnosis not present

## 2013-12-30 DIAGNOSIS — M1990 Unspecified osteoarthritis, unspecified site: Secondary | ICD-10-CM | POA: Diagnosis not present

## 2013-12-30 DIAGNOSIS — K219 Gastro-esophageal reflux disease without esophagitis: Secondary | ICD-10-CM | POA: Diagnosis not present

## 2013-12-30 DIAGNOSIS — Z008 Encounter for other general examination: Secondary | ICD-10-CM | POA: Diagnosis not present

## 2013-12-30 DIAGNOSIS — Q078 Other specified congenital malformations of nervous system: Secondary | ICD-10-CM | POA: Diagnosis not present

## 2013-12-31 DIAGNOSIS — Z853 Personal history of malignant neoplasm of breast: Secondary | ICD-10-CM | POA: Diagnosis not present

## 2013-12-31 DIAGNOSIS — Z01411 Encounter for gynecological examination (general) (routine) with abnormal findings: Secondary | ICD-10-CM | POA: Diagnosis not present

## 2013-12-31 DIAGNOSIS — D071 Carcinoma in situ of vulva: Secondary | ICD-10-CM | POA: Diagnosis not present

## 2014-01-01 DIAGNOSIS — Z1212 Encounter for screening for malignant neoplasm of rectum: Secondary | ICD-10-CM | POA: Diagnosis not present

## 2014-02-03 DIAGNOSIS — Z6838 Body mass index (BMI) 38.0-38.9, adult: Secondary | ICD-10-CM | POA: Diagnosis not present

## 2014-02-03 DIAGNOSIS — J45909 Unspecified asthma, uncomplicated: Secondary | ICD-10-CM | POA: Diagnosis not present

## 2014-02-03 DIAGNOSIS — J309 Allergic rhinitis, unspecified: Secondary | ICD-10-CM | POA: Diagnosis not present

## 2014-02-03 DIAGNOSIS — Z23 Encounter for immunization: Secondary | ICD-10-CM | POA: Diagnosis not present

## 2014-02-27 DIAGNOSIS — K130 Diseases of lips: Secondary | ICD-10-CM | POA: Diagnosis not present

## 2014-02-27 DIAGNOSIS — L738 Other specified follicular disorders: Secondary | ICD-10-CM | POA: Diagnosis not present

## 2014-02-27 DIAGNOSIS — L821 Other seborrheic keratosis: Secondary | ICD-10-CM | POA: Diagnosis not present

## 2014-04-07 DIAGNOSIS — Z6838 Body mass index (BMI) 38.0-38.9, adult: Secondary | ICD-10-CM | POA: Diagnosis not present

## 2014-04-07 DIAGNOSIS — J309 Allergic rhinitis, unspecified: Secondary | ICD-10-CM | POA: Diagnosis not present

## 2014-06-04 DIAGNOSIS — H40023 Open angle with borderline findings, high risk, bilateral: Secondary | ICD-10-CM | POA: Diagnosis not present

## 2014-06-04 DIAGNOSIS — H2513 Age-related nuclear cataract, bilateral: Secondary | ICD-10-CM | POA: Diagnosis not present

## 2014-06-04 DIAGNOSIS — H35033 Hypertensive retinopathy, bilateral: Secondary | ICD-10-CM | POA: Diagnosis not present

## 2014-06-04 DIAGNOSIS — H524 Presbyopia: Secondary | ICD-10-CM | POA: Diagnosis not present

## 2014-07-02 DIAGNOSIS — I10 Essential (primary) hypertension: Secondary | ICD-10-CM | POA: Diagnosis not present

## 2014-07-02 DIAGNOSIS — E785 Hyperlipidemia, unspecified: Secondary | ICD-10-CM | POA: Diagnosis not present

## 2014-07-02 DIAGNOSIS — K219 Gastro-esophageal reflux disease without esophagitis: Secondary | ICD-10-CM | POA: Diagnosis not present

## 2014-07-02 DIAGNOSIS — F419 Anxiety disorder, unspecified: Secondary | ICD-10-CM | POA: Diagnosis not present

## 2014-07-02 DIAGNOSIS — D72819 Decreased white blood cell count, unspecified: Secondary | ICD-10-CM | POA: Diagnosis not present

## 2014-07-02 DIAGNOSIS — J309 Allergic rhinitis, unspecified: Secondary | ICD-10-CM | POA: Diagnosis not present

## 2014-07-02 DIAGNOSIS — J45909 Unspecified asthma, uncomplicated: Secondary | ICD-10-CM | POA: Diagnosis not present

## 2014-07-24 DIAGNOSIS — J189 Pneumonia, unspecified organism: Secondary | ICD-10-CM

## 2014-07-24 HISTORY — DX: Pneumonia, unspecified organism: J18.9

## 2014-07-31 DIAGNOSIS — E876 Hypokalemia: Secondary | ICD-10-CM | POA: Diagnosis not present

## 2014-07-31 DIAGNOSIS — I10 Essential (primary) hypertension: Secondary | ICD-10-CM | POA: Diagnosis not present

## 2014-07-31 DIAGNOSIS — R252 Cramp and spasm: Secondary | ICD-10-CM | POA: Diagnosis not present

## 2014-07-31 DIAGNOSIS — Z6839 Body mass index (BMI) 39.0-39.9, adult: Secondary | ICD-10-CM | POA: Diagnosis not present

## 2014-07-31 DIAGNOSIS — K5730 Diverticulosis of large intestine without perforation or abscess without bleeding: Secondary | ICD-10-CM | POA: Diagnosis not present

## 2014-08-04 DIAGNOSIS — J984 Other disorders of lung: Secondary | ICD-10-CM | POA: Diagnosis not present

## 2014-08-04 DIAGNOSIS — J45909 Unspecified asthma, uncomplicated: Secondary | ICD-10-CM | POA: Diagnosis not present

## 2014-08-04 DIAGNOSIS — J189 Pneumonia, unspecified organism: Secondary | ICD-10-CM | POA: Diagnosis not present

## 2014-08-04 DIAGNOSIS — I10 Essential (primary) hypertension: Secondary | ICD-10-CM | POA: Diagnosis not present

## 2014-08-04 DIAGNOSIS — R05 Cough: Secondary | ICD-10-CM | POA: Diagnosis not present

## 2014-08-04 DIAGNOSIS — Z6839 Body mass index (BMI) 39.0-39.9, adult: Secondary | ICD-10-CM | POA: Diagnosis not present

## 2014-08-04 DIAGNOSIS — D72819 Decreased white blood cell count, unspecified: Secondary | ICD-10-CM | POA: Diagnosis not present

## 2014-08-04 DIAGNOSIS — M1990 Unspecified osteoarthritis, unspecified site: Secondary | ICD-10-CM | POA: Diagnosis not present

## 2014-08-06 ENCOUNTER — Encounter (HOSPITAL_COMMUNITY): Payer: Self-pay | Admitting: *Deleted

## 2014-08-06 ENCOUNTER — Inpatient Hospital Stay (HOSPITAL_COMMUNITY)
Admission: EM | Admit: 2014-08-06 | Discharge: 2014-08-11 | DRG: 194 | Disposition: A | Discharge status: Home Health | Payer: Medicare Other | Attending: Internal Medicine | Admitting: Internal Medicine

## 2014-08-06 ENCOUNTER — Emergency Department (HOSPITAL_COMMUNITY): Payer: Medicare Other

## 2014-08-06 DIAGNOSIS — E785 Hyperlipidemia, unspecified: Secondary | ICD-10-CM | POA: Diagnosis present

## 2014-08-06 DIAGNOSIS — J45909 Unspecified asthma, uncomplicated: Secondary | ICD-10-CM | POA: Diagnosis present

## 2014-08-06 DIAGNOSIS — F418 Other specified anxiety disorders: Secondary | ICD-10-CM | POA: Diagnosis present

## 2014-08-06 DIAGNOSIS — R509 Fever, unspecified: Secondary | ICD-10-CM | POA: Diagnosis not present

## 2014-08-06 DIAGNOSIS — I10 Essential (primary) hypertension: Secondary | ICD-10-CM | POA: Diagnosis not present

## 2014-08-06 DIAGNOSIS — Z79899 Other long term (current) drug therapy: Secondary | ICD-10-CM | POA: Diagnosis not present

## 2014-08-06 DIAGNOSIS — E871 Hypo-osmolality and hyponatremia: Secondary | ICD-10-CM | POA: Diagnosis not present

## 2014-08-06 DIAGNOSIS — Z853 Personal history of malignant neoplasm of breast: Secondary | ICD-10-CM

## 2014-08-06 DIAGNOSIS — R011 Cardiac murmur, unspecified: Secondary | ICD-10-CM | POA: Diagnosis present

## 2014-08-06 DIAGNOSIS — R51 Headache: Secondary | ICD-10-CM | POA: Diagnosis present

## 2014-08-06 DIAGNOSIS — F419 Anxiety disorder, unspecified: Secondary | ICD-10-CM | POA: Diagnosis present

## 2014-08-06 DIAGNOSIS — J9811 Atelectasis: Secondary | ICD-10-CM | POA: Diagnosis not present

## 2014-08-06 DIAGNOSIS — J029 Acute pharyngitis, unspecified: Secondary | ICD-10-CM | POA: Diagnosis present

## 2014-08-06 DIAGNOSIS — R631 Polydipsia: Secondary | ICD-10-CM | POA: Diagnosis present

## 2014-08-06 DIAGNOSIS — M62838 Other muscle spasm: Secondary | ICD-10-CM | POA: Diagnosis present

## 2014-08-06 DIAGNOSIS — J189 Pneumonia, unspecified organism: Principal | ICD-10-CM | POA: Diagnosis present

## 2014-08-06 DIAGNOSIS — R4589 Other symptoms and signs involving emotional state: Secondary | ICD-10-CM | POA: Diagnosis present

## 2014-08-06 DIAGNOSIS — Z90710 Acquired absence of both cervix and uterus: Secondary | ICD-10-CM

## 2014-08-06 DIAGNOSIS — R404 Transient alteration of awareness: Secondary | ICD-10-CM | POA: Diagnosis not present

## 2014-08-06 DIAGNOSIS — R531 Weakness: Secondary | ICD-10-CM | POA: Diagnosis not present

## 2014-08-06 DIAGNOSIS — M79604 Pain in right leg: Secondary | ICD-10-CM | POA: Diagnosis present

## 2014-08-06 DIAGNOSIS — Z87891 Personal history of nicotine dependence: Secondary | ICD-10-CM

## 2014-08-06 DIAGNOSIS — F54 Psychological and behavioral factors associated with disorders or diseases classified elsewhere: Secondary | ICD-10-CM | POA: Diagnosis not present

## 2014-08-06 HISTORY — DX: Pneumonia, unspecified organism: J18.9

## 2014-08-06 HISTORY — DX: Gastro-esophageal reflux disease without esophagitis: K21.9

## 2014-08-06 LAB — BASIC METABOLIC PANEL
ANION GAP: 9 (ref 5–15)
BUN: 7 mg/dL (ref 6–20)
CHLORIDE: 96 mmol/L — AB (ref 101–111)
CO2: 20 mmol/L — ABNORMAL LOW (ref 22–32)
Calcium: 8.7 mg/dL — ABNORMAL LOW (ref 8.9–10.3)
Creatinine, Ser: 0.64 mg/dL (ref 0.44–1.00)
GLUCOSE: 74 mg/dL (ref 65–99)
POTASSIUM: 4.2 mmol/L (ref 3.5–5.1)
Sodium: 125 mmol/L — ABNORMAL LOW (ref 135–145)

## 2014-08-06 LAB — COMPREHENSIVE METABOLIC PANEL
ALT: 13 U/L — ABNORMAL LOW (ref 14–54)
AST: 19 U/L (ref 15–41)
Albumin: 3.3 g/dL — ABNORMAL LOW (ref 3.5–5.0)
Alkaline Phosphatase: 58 U/L (ref 38–126)
Anion gap: 11 (ref 5–15)
BUN: <5 mg/dL — ABNORMAL LOW (ref 6–20)
CO2: 22 mmol/L (ref 22–32)
Calcium: 8.9 mg/dL (ref 8.9–10.3)
Chloride: 88 mmol/L — ABNORMAL LOW (ref 101–111)
Creatinine, Ser: 0.67 mg/dL (ref 0.44–1.00)
GFR calc Af Amer: >60 mL/min (ref >60)
GFR calc non Af Amer: >60 mL/min (ref >60)
Glucose, Bld: 106 mg/dL — ABNORMAL HIGH (ref 65–99)
Potassium: 3.8 mmol/L (ref 3.5–5.1)
Sodium: 121 mmol/L — ABNORMAL LOW (ref 135–145)
Total Bilirubin: 0.6 mg/dL (ref 0.3–1.2)
Total Protein: 6.9 g/dL (ref 6.5–8.1)

## 2014-08-06 LAB — SODIUM, URINE, RANDOM: Sodium, Ur: <10 mmol/L

## 2014-08-06 LAB — CBC
HCT: 35.8 % — ABNORMAL LOW (ref 36.0–46.0)
Hemoglobin: 12.9 g/dL (ref 12.0–15.0)
MCH: 29.3 pg (ref 26.0–34.0)
MCHC: 36 g/dL (ref 30.0–36.0)
MCV: 81.2 fL (ref 78.0–100.0)
Platelets: 202 10*3/uL (ref 150–400)
RBC: 4.41 MIL/uL (ref 3.87–5.11)
RDW: 13.6 % (ref 11.5–15.5)
WBC: 5.1 10*3/uL (ref 4.0–10.5)

## 2014-08-06 LAB — URINE MICROSCOPIC-ADD ON

## 2014-08-06 LAB — URINALYSIS, ROUTINE W REFLEX MICROSCOPIC
Bilirubin Urine: NEGATIVE
Glucose, UA: NEGATIVE mg/dL
Ketones, ur: 15 mg/dL — AB
Leukocytes, UA: NEGATIVE
Nitrite: NEGATIVE
Protein, ur: NEGATIVE mg/dL
Specific Gravity, Urine: 1.004 — ABNORMAL LOW (ref 1.005–1.030)
Urobilinogen, UA: 0.2 mg/dL (ref 0.0–1.0)
pH: 7 (ref 5.0–8.0)

## 2014-08-06 LAB — OSMOLALITY: Osmolality: 252 mOsm/kg — ABNORMAL LOW (ref 275–300)

## 2014-08-06 LAB — STREP PNEUMONIAE URINARY ANTIGEN: Strep Pneumo Urinary Antigen: NEGATIVE

## 2014-08-06 LAB — OSMOLALITY, URINE: Osmolality, Ur: 127 mOsm/kg — ABNORMAL LOW (ref 390–1090)

## 2014-08-06 LAB — CREATININE, URINE, RANDOM: Creatinine, Urine: 37.49 mg/dL

## 2014-08-06 MED ORDER — POTASSIUM CHLORIDE CRYS ER 20 MEQ PO TBCR
60.0000 meq | EXTENDED_RELEASE_TABLET | Freq: Three times a day (TID) | ORAL | Status: DC
  Administered 2014-08-06: 60 meq via ORAL
  Filled 2014-08-06: qty 3

## 2014-08-06 MED ORDER — LORATADINE 10 MG PO TABS
10.0000 mg | ORAL_TABLET | Freq: Every day | ORAL | Status: DC
  Administered 2014-08-06 – 2014-08-11 (×6): 10 mg via ORAL
  Filled 2014-08-06 (×6): qty 1

## 2014-08-06 MED ORDER — SODIUM CHLORIDE 0.9 % IV SOLN
INTRAVENOUS | Status: DC
  Administered 2014-08-06: 13:00:00 via INTRAVENOUS

## 2014-08-06 MED ORDER — LOSARTAN POTASSIUM 50 MG PO TABS
50.0000 mg | ORAL_TABLET | Freq: Every day | ORAL | Status: DC
  Administered 2014-08-06 – 2014-08-11 (×6): 50 mg via ORAL
  Filled 2014-08-06 (×6): qty 1

## 2014-08-06 MED ORDER — DEXTROSE 5 % IV SOLN
500.0000 mg | INTRAVENOUS | Status: DC
  Administered 2014-08-07 – 2014-08-08 (×2): 500 mg via INTRAVENOUS
  Filled 2014-08-06 (×2): qty 500

## 2014-08-06 MED ORDER — ONDANSETRON HCL 4 MG/2ML IJ SOLN
4.0000 mg | Freq: Once | INTRAMUSCULAR | Status: AC
  Administered 2014-08-06: 4 mg via INTRAVENOUS
  Filled 2014-08-06: qty 2

## 2014-08-06 MED ORDER — CELECOXIB 200 MG PO CAPS
200.0000 mg | ORAL_CAPSULE | Freq: Every day | ORAL | Status: DC | PRN
  Administered 2014-08-07: 200 mg via ORAL
  Filled 2014-08-06 (×2): qty 1

## 2014-08-06 MED ORDER — ALBUTEROL SULFATE (2.5 MG/3ML) 0.083% IN NEBU: 2.5000 mg | INHALATION_SOLUTION | Freq: Four times a day (QID) | RESPIRATORY_TRACT | Status: DC | PRN

## 2014-08-06 MED ORDER — SODIUM CHLORIDE 0.9 % IV BOLUS (SEPSIS)
1000.0000 mL | Freq: Once | INTRAVENOUS | Status: AC
  Administered 2014-08-06: 1000 mL via INTRAVENOUS

## 2014-08-06 MED ORDER — DEXTROMETHORPHAN POLISTIREX ER 30 MG/5ML PO SUER
30.0000 mg | Freq: Two times a day (BID) | ORAL | Status: DC | PRN
  Administered 2014-08-07: 30 mg via ORAL
  Filled 2014-08-06 (×4): qty 5

## 2014-08-06 MED ORDER — DEXTROSE 5 % IV SOLN
500.0000 mg | Freq: Once | INTRAVENOUS | Status: AC
  Administered 2014-08-06: 500 mg via INTRAVENOUS
  Filled 2014-08-06: qty 500

## 2014-08-06 MED ORDER — CEFTRIAXONE SODIUM IN DEXTROSE 20 MG/ML IV SOLN
1.0000 g | INTRAVENOUS | Status: DC
  Administered 2014-08-07 – 2014-08-11 (×5): 1 g via INTRAVENOUS
  Filled 2014-08-06 (×6): qty 50

## 2014-08-06 MED ORDER — MOMETASONE FURO-FORMOTEROL FUM 100-5 MCG/ACT IN AERO
2.0000 | INHALATION_SPRAY | Freq: Two times a day (BID) | RESPIRATORY_TRACT | Status: DC
  Administered 2014-08-06 – 2014-08-11 (×9): 2 via RESPIRATORY_TRACT
  Filled 2014-08-06 (×2): qty 8.8

## 2014-08-06 MED ORDER — PANTOPRAZOLE SODIUM 40 MG PO TBEC
40.0000 mg | DELAYED_RELEASE_TABLET | Freq: Every day | ORAL | Status: DC
  Administered 2014-08-06 – 2014-08-11 (×6): 40 mg via ORAL
  Filled 2014-08-06 (×6): qty 1

## 2014-08-06 MED ORDER — DEXTROSE 5 % IV SOLN
1.0000 g | Freq: Once | INTRAVENOUS | Status: AC
  Administered 2014-08-06: 1 g via INTRAVENOUS
  Filled 2014-08-06: qty 10

## 2014-08-06 MED ORDER — SIMVASTATIN 20 MG PO TABS
20.0000 mg | ORAL_TABLET | Freq: Every evening | ORAL | Status: DC
  Administered 2014-08-06 – 2014-08-10 (×5): 20 mg via ORAL
  Filled 2014-08-06 (×6): qty 1

## 2014-08-06 MED ORDER — FLUTICASONE PROPIONATE 50 MCG/ACT NA SUSP
1.0000 | Freq: Every day | NASAL | Status: DC
  Administered 2014-08-06 – 2014-08-10 (×5): 1 via NASAL
  Filled 2014-08-06: qty 16

## 2014-08-06 MED ORDER — ONDANSETRON HCL 4 MG PO TABS: 4.0000 mg | ORAL_TABLET | Freq: Four times a day (QID) | ORAL | Status: DC | PRN

## 2014-08-06 MED ORDER — ENOXAPARIN SODIUM 40 MG/0.4ML ~~LOC~~ SOLN
40.0000 mg | SUBCUTANEOUS | Status: DC
  Administered 2014-08-06 – 2014-08-11 (×6): 40 mg via SUBCUTANEOUS
  Filled 2014-08-06 (×6): qty 0.4

## 2014-08-06 MED ORDER — METHOCARBAMOL 500 MG PO TABS
500.0000 mg | ORAL_TABLET | Freq: Four times a day (QID) | ORAL | Status: DC | PRN
  Administered 2014-08-06: 500 mg via ORAL
  Filled 2014-08-06: qty 1

## 2014-08-06 MED ORDER — AMLODIPINE BESYLATE 10 MG PO TABS
10.0000 mg | ORAL_TABLET | Freq: Every evening | ORAL | Status: DC
Start: 2014-08-06 — End: 2014-08-11
  Administered 2014-08-06 – 2014-08-10 (×4): 10 mg via ORAL
  Filled 2014-08-06 (×6): qty 1

## 2014-08-06 MED ORDER — LORAZEPAM 2 MG/ML IJ SOLN
1.0000 mg | Freq: Once | INTRAMUSCULAR | Status: AC
  Administered 2014-08-06: 1 mg via INTRAVENOUS
  Filled 2014-08-06: qty 1

## 2014-08-06 MED ORDER — ACETAMINOPHEN 650 MG RE SUPP: 650.0000 mg | Freq: Four times a day (QID) | RECTAL | Status: DC | PRN

## 2014-08-06 MED ORDER — DEXTROSE 5 % IV SOLN: 500.0000 mg | INTRAVENOUS | Status: DC

## 2014-08-06 MED ORDER — SENNOSIDES-DOCUSATE SODIUM 8.6-50 MG PO TABS: 1.0000 | ORAL_TABLET | Freq: Every evening | ORAL | Status: DC | PRN

## 2014-08-06 MED ORDER — SODIUM CHLORIDE 0.9 % IV BOLUS (SEPSIS)
500.0000 mL | Freq: Once | INTRAVENOUS | Status: AC
  Administered 2014-08-06: 500 mL via INTRAVENOUS

## 2014-08-06 MED ORDER — SODIUM CHLORIDE 0.9 % IV SOLN
INTRAVENOUS | Status: DC
  Administered 2014-08-06 (×2): via INTRAVENOUS

## 2014-08-06 MED ORDER — ONDANSETRON HCL 4 MG/2ML IJ SOLN: 4.0000 mg | Freq: Four times a day (QID) | INTRAMUSCULAR | Status: DC | PRN

## 2014-08-06 MED ORDER — GUAIFENESIN ER 600 MG PO TB12
1200.0000 mg | ORAL_TABLET | Freq: Two times a day (BID) | ORAL | Status: DC
  Administered 2014-08-06 – 2014-08-11 (×11): 1200 mg via ORAL
  Filled 2014-08-06 (×15): qty 2

## 2014-08-06 MED ORDER — POTASSIUM CHLORIDE CRYS ER 20 MEQ PO TBCR
40.0000 meq | EXTENDED_RELEASE_TABLET | Freq: Three times a day (TID) | ORAL | Status: DC
  Administered 2014-08-06 – 2014-08-07 (×4): 40 meq via ORAL
  Filled 2014-08-06 (×7): qty 2

## 2014-08-06 MED ORDER — ALUM & MAG HYDROXIDE-SIMETH 200-200-20 MG/5ML PO SUSP: 30.0000 mL | Freq: Four times a day (QID) | ORAL | Status: DC | PRN

## 2014-08-06 MED ORDER — ACETAMINOPHEN 325 MG PO TABS
325.0000 mg | ORAL_TABLET | Freq: Once | ORAL | Status: AC
  Administered 2014-08-06: 325 mg via ORAL
  Filled 2014-08-06: qty 1

## 2014-08-06 MED ORDER — POLYETHYLENE GLYCOL 3350 17 G PO PACK
17.0000 g | PACK | Freq: Every day | ORAL | Status: DC | PRN
  Administered 2014-08-07: 17 g via ORAL
  Filled 2014-08-06: qty 1

## 2014-08-06 MED ORDER — ACETAMINOPHEN 325 MG PO TABS
650.0000 mg | ORAL_TABLET | Freq: Four times a day (QID) | ORAL | Status: DC | PRN
  Administered 2014-08-06 – 2014-08-11 (×7): 650 mg via ORAL
  Filled 2014-08-06 (×10): qty 2

## 2014-08-06 NOTE — Evaluation (Signed)
Clinical/Bedside Swallow Evaluation Patient Details  Name: Bridget Cortez MRN: [id number] Date of Birth: 07-16-1945  Today's Date: 08/06/2014 Time: SLP Start Time (ACUTE ONLY): 71 SLP Stop Time (ACUTE ONLY): 1637 SLP Time Calculation (min) (ACUTE ONLY): 22 min  Past Medical History:  Past Medical History  Diagnosis Date  . Asthma   . Multiple allergies   . Chiari malformation   . Heart murmur   . Hypertension   . H/O varicella   . Yeast infection   . Ovarian cyst   . Breast cancer 2007    Left  . 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 H/O osteopenia     08/2006  . Monilial vaginitis 07/2007  . 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 Abnormal Pap smear 2006    vaginal medicine according to patient  . Personal history of colonic adenomas 03/13/2012  . Diverticulosis   . Pneumonia 07/2014  . GERD (gastroesophageal reflux disease)    Past Surgical History:  Past Surgical History  Procedure Laterality Date  . Hernia repair    . Abdominal surgery    . Knee surgery      right  . Abdominal hysterectomy    . Breast surgery      Lumpectomy, left breast  . Colonoscopy w/ biopsies     HPI:  69 y.o. female with asthma, who presents with fever, chest pain, and sore throat.  Chest x-ray shows right middle lobe infiltrate and left lower lobe possible atelectasis versus infiltrate.  Dx CAP.   Assessment / Plan / Recommendation Clinical Impression  Pt presesnts with normal oropharyngeal swallow with active mastication, brisk swallow response, and no s/s of aspiration.  RR consistent with adequate swallow/ventilatory coordination.  Recommend regular diet, thin liquids, meds whole with liquid.  No SLP f/u warranted.    Aspiration Risk  Mild    Diet Recommendation Age appropriate regular solids   Medication Administration: Whole meds with liquid    Other  Recommendations Oral Care Recommendations: Oral care BID       Swallow Study Prior Functional Status        General Date of Onset: 08/06/14 Other Pertinent Information: 69 y.o. female with asthma, who presents with fever, chest pain, and sore throat.  Chest x-ray shows right middle lobe infiltrate and left lower lobe possible atelectasis versus infiltrate.  Dx CAP. Type of Study: Bedside swallow evaluation Previous Swallow Assessment: none Diet Prior to this Study: Regular;Thin liquids Temperature Spikes Noted: Yes Respiratory Status: Room air History of Recent Intubation: No Behavior/Cognition: Alert;Cooperative;Pleasant mood Oral Cavity - Dentition: Adequate natural dentition/normal for age Self-Feeding Abilities: Able to feed self Patient Positioning: Upright in bed Baseline Vocal Quality: Normal Volitional Cough: Strong    Oral/Motor/Sensory Function Overall Oral Motor/Sensory Function: Appears within functional limits for tasks assessed   Ice Chips Ice chips: Within functional limits Presentation: Spoon   Thin Liquid Thin Liquid: Within functional limits Presentation: Cup;Straw    Nectar Thick Nectar Thick Liquid: Not tested   Honey Thick Honey Thick Liquid: Not tested   Puree Puree: Within functional limits   Solid  Kamaljit Hizer L. [place], [place] CCC/SLP Pager [phone]     Solid: Within functional limits       Juan Quam Laurice 08/06/2014,4:38 PM

## 2014-08-06 NOTE — H&P (Signed)
[facility] History and Physical                                                                                    Bridget Cortez, is a 69 y.o. female  MRN: [id number]   DOB - October 15, 1945  Admit Date - 08/06/2014  Outpatient Primary MD for the patient is Tivis Ringer, MD  Referring Physician:  Dr. Wilson Singer, EDP  Chief Complaint:   Chief Complaint  Patient presents with  . Nausea     HPI  Bridget Cortez  is a 69 y.o. female, with asthma, who presents for fever, chest pain, and sore throat. The patient reports that last Friday (7/8) she developed pain in her right leg and back. She saw her PCP and was placed on low-dose Robaxin. She reports this did not help her symptoms. On Monday she saw her PCP and was placed on 500 mg of Robaxin which did help her symptoms. Since Monday she has developed fever, sore throat, productive cough, and diaphoresis. She has had chest pain with coughing. Her primary care physician started her on Levaquin 500 mg on Tuesday. Her symptoms have not improved she feels they have worsened. In the ER she tells my attending physician that she had severe childhood asthma. She had pneumonia in the 1990s and was in the ICU for a week. She states, "I get scared".  In the emergency department chest x-ray shows right middle lobe infiltrate and left lower lobe possible atelectasis versus infiltrate. Her temperature is 100.9. Her sodium has decreased to 121. We will admit her to [facility] for treatment of community acquired pneumonia and hyponatremia.  Review of Systems   In addition to the HPI above,   ++ Headache, but No changes with Vision or hearing, No problems swallowing food or Liquids, + + Chest pain with coughing, but no dyspnea on exertion or tachypnea No Abdominal pain, Bowel movements are regular, she had one episode of diarrhea 7/13 but none since  No Blood in stool or Urine, No dysuria, No new skin rashes or bruises, No new joints pains-aches,  No new  weakness, tingling, numbness in any extremity, No recent weight gain or loss, A full 10 point Review of Systems was done, except as stated above, all other Review of Systems were negative.  Past Medical History  Past Medical History  Diagnosis Date  . Asthma   . Multiple allergies   . Chiari malformation   . Heart murmur   . Hypertension   . H/O varicella   . Yeast infection   . Ovarian cyst   . Breast cancer 2007    Left  . 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 H/O osteopenia     08/2006  . Monilial vaginitis 07/2007  . 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 Abnormal Pap smear 2006    vaginal medicine according to patient  . Personal history of colonic adenomas 03/13/2012  . Diverticulosis     Past Surgical History  Procedure Laterality Date  . Hernia repair    . Abdominal surgery    . Knee surgery      right  . Abdominal hysterectomy    .  Breast surgery      Lumpectomy, left breast  . Colonoscopy w/ biopsies        Social History History  Substance Use Topics  . Smoking status: Former Smoker    Quit date: 01/23/1978  . Smokeless tobacco: Never Used  . Alcohol Use: No  She lives at home with her husband, she is independent with ADLs. 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   Family History Family History  Problem Relation Age of Onset  . Cancer Mother   . Heart failure Other   . Colon cancer Neg Hx   . Esophageal cancer Neg Hx   . Rectal cancer Neg Hx   . Stomach cancer Neg Hx     Prior to Admission medications   Medication Sig Start Date End Date Taking? Authorizing Provider  albuterol (PROVENTIL) (2.5 MG/3ML) 0.083% nebulizer solution Take 2.5 mg by nebulization every 6 (six) hours as needed. For shortness of breath    Historical Provider, MD  amLODipine (NORVASC) 10 MG tablet Take 10 mg by mouth every evening.     Historical Provider, MD  Calcium Carbonate-Vitamin D (CALTRATE 600+D PO) Take 1 tablet by  mouth daily.    Historical Provider, MD  celecoxib (CELEBREX) 200 MG capsule Take 200 mg by mouth 2 (two) times daily as needed for moderate pain.     Historical Provider, MD  cetirizine (ZYRTEC) 10 MG tablet Take 10 mg by mouth daily.    Historical Provider, MD  diazepam (VALIUM) 5 MG tablet Take 1 tablet (5 mg total) by mouth 2 (two) times daily. 03/29/13   Noland Fordyce, [name]  esomeprazole (NEXIUM) 40 MG capsule Take 40 mg by mouth daily before breakfast.    Historical Provider, MD  fluticasone (FLONASE) 50 MCG/ACT nasal spray Place 1 spray into the nose at bedtime.  03/16/12   Historical Provider, MD  Fluticasone-Salmeterol (ADVAIR) 250-50 MCG/DOSE AEPB Inhale 1 puff into the lungs every 12 (twelve) hours.    Historical Provider, MD  losartan (COZAAR) 50 MG tablet Take 50 mg by mouth daily.    Historical Provider, MD  polyethylene glycol (MIRALAX / GLYCOLAX) packet Take 17 g by mouth daily as needed for mild constipation or moderate constipation.    Historical Provider, MD  potassium chloride SA (K-DUR,KLOR-CON) 20 MEQ tablet Take 60 mEq by mouth daily.    Historical Provider, MD  simvastatin (ZOCOR) 20 MG tablet Take 20 mg by mouth every evening.    Historical Provider, MD  triamterene-hydrochlorothiazide (MAXZIDE) 75-50 MG per tablet Take 1 tablet by mouth daily.    Historical Provider, MD    Allergies  Allergen Reactions  . Ephedrine Shortness Of Breath    Physical Exam  Vitals  Blood pressure 104/70, pulse 83, temperature 98.9 F (37.2 C), temperature source Oral, resp. rate 25, height 5\' 9"  (1.753 m), weight 114.76 kg (253 lb), SpO2 96 %.   General:  well-developed, overweight female  lying in bed With mild anxiety, husband and daughter at bedside.   Psych:  Normal affect and insight, Not Suicidal or Homicidal, Awake Alert, Oriented X 3.  Neuro:   No F.N deficits, ALL C.Nerves Intact, Strength 5/5 all 4 extremities, Sensation intact all 4 extremities.  ENT:  Ears and Eyes  appear Normal, Conjunctivae clear, PER. Moist oral mucosa without erythema or exudates.  Neck:  Supple, No lymphadenopathy appreciated  Respiratory:  Symmetrical chest wall movement, decreased breath sounds, no wheezes crackles or rales.  Cardiac:  RRR, 2/6 systolic murmur heard best at the left sternal border, no JVD.    Abdomen:  Positive bowel sounds, Soft, Non tender, Non distended,  No masses appreciated  Skin:  No Cyanosis, Normal Skin Turgor, No Skin Rash or Bruise.  Extremities:  Able to move all 4. 5/5 strength in each,  no effusions.Trace edema bilaterally.   Data Review  CBC  Recent Labs Lab 08/06/14 0851  WBC 5.1  HGB 12.9  HCT 35.8*  PLT 202  MCV 81.2  MCH 29.3  MCHC 36.0  RDW 13.6    Chemistries   Recent Labs Lab 08/06/14 0851  NA 121*  K 3.8  CL 88*  CO2 22  GLUCOSE 106*  BUN <5*  CREATININE 0.67  CALCIUM 8.9  AST 19  ALT 13*  ALKPHOS 58  BILITOT 0.6     Urinalysis    Component Value Date/Time   COLORURINE YELLOW 08/06/2014 1014   APPEARANCEUR CLEAR 08/06/2014 1014   LABSPEC 1.004* 08/06/2014 1014   PHURINE 7.0 08/06/2014 1014   GLUCOSEU NEGATIVE 08/06/2014 1014   HGBUR SMALL* 08/06/2014 [place] 08/06/2014 1014   KETONESUR 15* 08/06/2014 1014   PROTEINUR NEGATIVE 08/06/2014 1014   UROBILINOGEN 0.2 08/06/2014 1014   NITRITE NEGATIVE 08/06/2014 1014   LEUKOCYTESUR NEGATIVE 08/06/2014 1014    Imaging results:   Dg Chest 2 View  08/06/2014   CLINICAL DATA:  Fever, pneumonia  EXAM: CHEST  2 VIEW  COMPARISON:  11/17/2010  FINDINGS: Borderline cardiomegaly. There is infiltrate/ pneumonia in right middle lobe and right base. Streaky atelectasis or infiltrate left base. Surgical clips are noted in left axilla. Degenerative changes right shoulder.  IMPRESSION: Infiltrate/pneumonia in right middle lobe and right base. Streaky at atelectasis or infiltrate left base. Follow-up to resolution after appropriate treatment is  recommended.   Electronically Signed   By: Lahoma Crocker M.D.   On: 08/06/2014 10:12    My personal review of EKG: Not done in the ER.   Assessment & Plan  Principal Problem:   CAP (community acquired pneumonia) Active Problems:   Heart murmur   Hyponatremia   Asthma   Anxiety about health   Community-acquired pneumonia.  Rceived 48 hours of Levaquin. Symptoms have worsened. Will start azithromycin and Rocephin. Blood cultures were not obtained. Sputum culture, urine for Legionella antigen and strep pneumo antigen are pending.  Hyponatremia (121) It appears that her sodium was also low in May 2014. So I'm uncertain whether or not this is acute. If it is acute and could possibly be due to pneumonia plus/minus thiazide medication. Will check serum osmoles, urine osmoles, urine sodium and serum sodium. Will hold thiazide medications and give gentle IV fluids Recheck bmet at 4 PM this afternoon.  Asthma Appears stable. Patient is not wheezing. We will continue Advair, flonase, prn Nebs.  Hypertension Hold Maxzide due to hyponatremia.  Will continue losartan, Norvasc  Recent right leg pain/muscle spasms No current complaints.  Will hold robaxin.  Hyperlipidemia Continue Zocor  History of hypokalemia. Will continue daily potassium supplementation.  History of heart murmur Stable.  Consider 2D echo outpatient if deemed appropriate.  Presumed anxiety Will hold Valium for now.   Consultants Called:  None  Family Communication:   Husband and daughter at bedside  Code Status:  Full code  Condition:  Stable  Potential Disposition: Estimated 48 hours. Discharge to home  Time spent in minutes : [address],  [name] on 08/06/2014  at 11:09 AM Between 7am to 7pm - Pager - [phone] After 7pm go to [email or web address] - password [email or web address] And look for the night coverage person covering me after hours  [facility]

## 2014-08-06 NOTE — Progress Notes (Signed)
Report received from [facility]. Asked to collect patient urine and orthostatic VS prior to sending to floor.

## 2014-08-06 NOTE — ED Notes (Signed)
Admitting MD at bedside.

## 2014-08-06 NOTE — Progress Notes (Signed)
NURSING PROGRESS NOTE  Bridget Cortez [id number] Admission Data: 08/06/2014 3:26 PM Attending Provider: Nita Sells, MD KPQ:AESL,[id number] R, MD Code Status: Full  Allergies:  Ephedrine Past Medical History:   has a past medical history of Asthma; Multiple allergies; Chiari malformation; Heart murmur; Hypertension; H/O varicella; Yeast infection; Ovarian cyst; Breast cancer (2007); Atrophy of vagina (06/2005); H/O osteopenia; Monilial vaginitis (07/2007); VIN III (vulvar intraepithelial neoplasia III) (03/2009); Abnormal Pap smear (2006); Personal history of colonic adenomas (03/13/2012); Diverticulosis; Pneumonia (07/2014); and GERD (gastroesophageal reflux disease). Past Surgical History:   has past surgical history that includes Hernia repair; Abdominal surgery; Knee surgery; Abdominal hysterectomy; Breast surgery; and Colonoscopy w/ biopsies. Social History:   reports that she quit smoking about 36 years ago. She has never used smokeless tobacco. She reports that she does not drink alcohol or use illicit drugs.  Bridget Cortez is a 69 y.o. female patient admitted from ED:   Last Documented Vital Signs: Blood pressure 118/66, pulse 95, temperature 99.7 F (37.6 C), temperature source Oral, resp. rate 20, height 5\' 9"  (1.753 m), weight 114.5 kg (252 lb 6.8 oz), SpO2 96 %.  Cardiac Monitoring:  None ordered.  IV Fluids:  IV in place, occlusive dsg intact without redness, IV cath forearm right, condition patent and no redness normal saline.   Skin: WDL  Patient/Family orientated to room. Information packet given to patient/family. Admission inpatient armband information verified with patient/family to include name and date of birth and placed on patient arm. Side rails up x 2, fall assessment and education completed with patient/family. Patient/family able to verbalize understanding of risk associated with falls and verbalized understanding to call for assistance before getting  out of bed. Call light within reach. Patient/family able to voice and demonstrate understanding of unit orientation instructions.    Will continue to evaluate and treat per MD orders.   Hendricks Limes RN, BS, BSN

## 2014-08-06 NOTE — ED Notes (Signed)
Monday pt was prescribed levaquin for pnx.  Yesterday pt had 1 bout of diarrhea.  Woke up this am and states that her temp was 106.5 (EMS states 99). Pt took chloraseptic for her sore throat and she began vomiting.  VS stable.

## 2014-08-06 NOTE — ED Provider Notes (Signed)
CSN: [id number]     Arrival date & time 08/06/14  7829 History   First MD Initiated Contact with Patient 08/06/14 0830     Chief Complaint  Patient presents with  . Nausea     (Consider location/radiation/quality/duration/timing/severity/associated sxs/prior Treatment) HPI   69 year old female with multiple complaints. She began having "muscle spasms" this past Friday. She describes this as achy pain all over. She saw her PCP on Monday. Was diagnosed with pneumonia and started on Levaquin. Began taking Tuesday and has had two doses. Symptoms have persisted despite this. Continues to have aches and pains all over his. Intermittent fevers and chills. Nausea and vomited once this morning. Feels generally weak. No shortness of breath, but she does have some sharp pain right in the center of her chest when she coughs.   Past Medical History  Diagnosis Date  . Asthma   . Multiple allergies   . Chiari malformation   . Heart murmur   . Hypertension   . H/O varicella   . Yeast infection   . Ovarian cyst   . Breast cancer 2007    Left  . 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 H/O osteopenia     08/2006  . Monilial vaginitis 07/2007  . 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 Abnormal Pap smear 2006    vaginal medicine according to patient  . Personal history of colonic adenomas 03/13/2012  . Diverticulosis    Past Surgical History  Procedure Laterality Date  . Hernia repair    . Abdominal surgery    . Knee surgery      right  . Abdominal hysterectomy    . Breast surgery      Lumpectomy, left breast  . Colonoscopy w/ biopsies     Family History  Problem Relation Age of Onset  . Cancer Mother   . Heart failure Other   . Colon cancer Neg Hx   . Esophageal cancer Neg Hx   . Rectal cancer Neg Hx   . Stomach cancer Neg Hx    History  Substance Use Topics  . Smoking status: Former Smoker    Quit date: 01/23/1978  . Smokeless tobacco: Never Used  . Alcohol Use: No   OB  History    Gravida Para Term Preterm AB TAB SAB Ectopic Multiple Living   0 0  0      1      Obstetric Comments   PT HAS A ADOPTED CHILD GIRL     Review of Systems  All systems reviewed and negative, other than as noted in HPI.   Allergies  Ephedrine  Home Medications   Prior to Admission medications   Medication Sig Start Date End Date Taking? Authorizing Provider  albuterol (PROVENTIL) (2.5 MG/3ML) 0.083% nebulizer solution Take 2.5 mg by nebulization every 6 (six) hours as needed. For shortness of breath    Historical Provider, MD  amLODipine (NORVASC) 10 MG tablet Take 10 mg by mouth every evening.     Historical Provider, MD  Calcium Carbonate-Vitamin D (CALTRATE 600+D PO) Take 1 tablet by mouth daily.    Historical Provider, MD  celecoxib (CELEBREX) 200 MG capsule Take 200 mg by mouth 2 (two) times daily as needed for moderate pain.     Historical Provider, MD  cetirizine (ZYRTEC) 10 MG tablet Take 10 mg by mouth daily.    Historical Provider, MD  diazepam (VALIUM) 5 MG tablet Take 1 tablet (5 mg total) by mouth  2 (two) times daily. 03/29/13   Noland Fordyce, [name]  esomeprazole (NEXIUM) 40 MG capsule Take 40 mg by mouth daily before breakfast.    Historical Provider, MD  fluticasone (FLONASE) 50 MCG/ACT nasal spray Place 1 spray into the nose at bedtime.  03/16/12   Historical Provider, MD  Fluticasone-Salmeterol (ADVAIR) 250-50 MCG/DOSE AEPB Inhale 1 puff into the lungs every 12 (twelve) hours.    Historical Provider, MD  losartan (COZAAR) 50 MG tablet Take 50 mg by mouth daily.    Historical Provider, MD  polyethylene glycol (MIRALAX / GLYCOLAX) packet Take 17 g by mouth daily as needed for mild constipation or moderate constipation.    Historical Provider, MD  potassium chloride SA (K-DUR,KLOR-CON) 20 MEQ tablet Take 60 mEq by mouth daily.    Historical Provider, MD  simvastatin (ZOCOR) 20 MG tablet Take 20 mg by mouth every evening.    Historical Provider, MD   triamterene-hydrochlorothiazide (MAXZIDE) 75-50 MG per tablet Take 1 tablet by mouth daily.    Historical Provider, MD   BP 122/72 mmHg  Pulse 66  Temp(Src) 100.9 F (38.3 C) (Oral)  Resp 16  Ht 5\' 9"  (1.753 m)  Wt 253 lb (114.76 kg)  BMI 37.34 kg/m2  SpO2 96% Physical Exam  Constitutional: She appears well-developed and well-nourished. No distress.  HENT:  Head: Normocephalic and atraumatic.  Eyes: Conjunctivae are normal. Right eye exhibits no discharge. Left eye exhibits no discharge.  Neck: Neck supple.  Cardiovascular: Normal rate, regular rhythm and normal heart sounds.  Exam reveals no gallop and no friction rub.   No murmur heard. Pulmonary/Chest: Effort normal and breath sounds normal. No respiratory distress.  Abdominal: Soft. She exhibits no distension. There is no tenderness.  Musculoskeletal: She exhibits no edema or tenderness.  Neurological: She is alert.  Skin: Skin is warm and dry.  Psychiatric: She has a normal mood and affect. Her behavior is normal. Thought content normal.  Nursing note and vitals reviewed.   ED Course  Procedures (including critical care time) Labs Review Labs Reviewed  COMPREHENSIVE METABOLIC PANEL - Abnormal; Notable for the following:    Sodium 121 (*)    Chloride 88 (*)    Glucose, Bld 106 (*)    BUN <5 (*)    Albumin 3.3 (*)    ALT 13 (*)    All other components within normal limits  CBC - Abnormal; Notable for the following:    HCT 35.8 (*)    All other components within normal limits  URINALYSIS, ROUTINE W REFLEX MICROSCOPIC (NOT AT [facility])    Imaging Review Dg Chest 2 View  08/06/2014   CLINICAL DATA:  Fever, pneumonia  EXAM: CHEST  2 VIEW  COMPARISON:  11/17/2010  FINDINGS: Borderline cardiomegaly. There is infiltrate/ pneumonia in right middle lobe and right base. Streaky atelectasis or infiltrate left base. Surgical clips are noted in left axilla. Degenerative changes right shoulder.  IMPRESSION: Infiltrate/pneumonia in  right middle lobe and right base. Streaky at atelectasis or infiltrate left base. Follow-up to resolution after appropriate treatment is recommended.   Electronically Signed   By: Lahoma Crocker M.D.   On: 08/06/2014 10:12     EKG Interpretation   Date/Time:  Thursday August 06 2014 11:14:29 EDT Ventricular Rate:  86 PR Interval:  172 QRS Duration: 104 QT Interval:  381 QTC Calculation: 456 R Axis:   -16 Text Interpretation:  Sinus rhythm Borderline left axis deviation Low  voltage, precordial leads ED PHYSICIAN INTERPRETATION AVAILABLE  IN [place]  [facility] Confirmed by TEST, Record ([id number]) on 08/07/2014 7:23:00 AM      MDM   Final diagnoses:  Fever  CAP (community acquired pneumonia)  Hyponatremia    68yf with generalized weakness, fever and pleuritic CP. CXR consistent with pneumonia. Persistent fever and no significant change in symptoms despite starting on abx although has only had two doses of levaquin at this point. Also hyponatremic. On maxzide. Acutely worse 2/2 SIADH from pneumonia?    Virgel Manifold, MD 08/07/14 [phone]

## 2014-08-06 NOTE — Progress Notes (Signed)
MD Samtani paged to notify him of patient admission to [place].

## 2014-08-07 DIAGNOSIS — I10 Essential (primary) hypertension: Secondary | ICD-10-CM

## 2014-08-07 DIAGNOSIS — J189 Pneumonia, unspecified organism: Principal | ICD-10-CM

## 2014-08-07 DIAGNOSIS — E871 Hypo-osmolality and hyponatremia: Secondary | ICD-10-CM

## 2014-08-07 LAB — BASIC METABOLIC PANEL
Anion gap: 9 (ref 5–15)
BUN: 6 mg/dL (ref 6–20)
CO2: 21 mmol/L — AB (ref 22–32)
Calcium: 8.5 mg/dL — ABNORMAL LOW (ref 8.9–10.3)
Chloride: 95 mmol/L — ABNORMAL LOW (ref 101–111)
Creatinine, Ser: 0.72 mg/dL (ref 0.44–1.00)
GFR calc Af Amer: >60 mL/min (ref >60)
GLUCOSE: 90 mg/dL (ref 65–99)
POTASSIUM: 4.7 mmol/L (ref 3.5–5.1)
Sodium: 125 mmol/L — ABNORMAL LOW (ref 135–145)

## 2014-08-07 LAB — CBC
HEMATOCRIT: 36 % (ref 36.0–46.0)
Hemoglobin: 12.3 g/dL (ref 12.0–15.0)
MCH: 28.8 pg (ref 26.0–34.0)
MCHC: 34.2 g/dL (ref 30.0–36.0)
MCV: 84.3 fL (ref 78.0–100.0)
PLATELETS: 185 10*3/uL (ref 150–400)
RBC: 4.27 MIL/uL (ref 3.87–5.11)
RDW: 14.5 % (ref 11.5–15.5)
WBC: 4.3 10*3/uL (ref 4.0–10.5)

## 2014-08-07 LAB — LEGIONELLA ANTIGEN, URINE

## 2014-08-07 MED ORDER — DIAZEPAM 5 MG PO TABS
2.5000 mg | ORAL_TABLET | Freq: Two times a day (BID) | ORAL | Status: DC
  Administered 2014-08-07 – 2014-08-11 (×8): 2.5 mg via ORAL
  Filled 2014-08-07 (×8): qty 1

## 2014-08-07 MED ORDER — HYDRALAZINE HCL 20 MG/ML IJ SOLN: 10.0000 mg | Freq: Four times a day (QID) | INTRAMUSCULAR | Status: DC | PRN

## 2014-08-07 NOTE — Progress Notes (Addendum)
Pt had fever of 101.6, on-call clinician was notified. A 564ml bolus was ordered along with blood cultures x2.

## 2014-08-07 NOTE — Progress Notes (Addendum)
PROGRESS NOTE    Bridget Cortez:[id number] DOB: 10-13-1945 DOA: 08/06/2014 PCP: Tivis Ringer, MD  HPI/Brief narrative 69 year old female with history of asthma, HTN, breast cancer, recently started on levofloxacin by PCP on Tuesday for complaints of fever, sore throat, productive cough, diaphoresis and pleuritic chest pain, admitted to [facility] on 08/06/14 due to worsening symptoms. In the ED, chest x-ray showed RML and LLL atelectasis versus infiltrate. She had temperature of 100.9, sodium of 121 and was admitted for management of community-acquired pneumonia and hyponatremia.   Assessment/Plan:  Community-acquired pneumonia-RML and and bibasal - Completed 48 hours of outpatient oral levofloxacin without improvement. - Started empirically on IV Rocephin and azithromycin - Improving. Continue management. - We'll need follow-up chest x-ray in 4-6 weeks to ensure resolution of pneumonia  Hyponatremia-likely from polydipsia - Sodium 121 on admission. - May be multifactorial secondary to dehydration, HCTZ, excess free water intake - Urine osmolarity 127, serum osmolarity 252: Both low suggesting polydipsia (patient volunteers to drinking obsessively and compulsively excess amount of water because she thinks that it is healthy ( - Held HCTZ. DC IV fluids. Fluid restriction. - Sodium improved to 125. Likely subacute. Asymptomatic. - Follow BMP.  Asthma - Stable. When necessary bronchodilators.  Essential hypertension - Fluctuating. - Continue losartan and Norvasc. Hold HCTZ.  Recent right leg pain and muscle spasms - Resolved.  HLD - Zocor  History of breast cancer  Anxiety disorder/chronic benzodiazepine use - Continue home dose of Valium.   DVT prophylaxis: Lovenox Code Status: Full Family Communication: Discussed with spouse at bedside Disposition Plan: DC home when medically stable, possibly in 2-3  days.   Consultants:  None  Procedures:  None  Antibiotics:  IV Rocephin 7/14 >  IV azithromycin 7/14 >   Subjective: Feels much better. No further left-sided temporal headache or leg spasms. Feels stronger. No dyspnea or cough reported. No chest pain.  Objective: Filed Vitals:   08/07/14 0435 08/07/14 0536 08/07/14 1002 08/07/14 1443  BP:  111/80  181/109  Pulse:  90  91  Temp: 98 F (36.7 C) 100 F (37.8 C)  99.7 F (37.6 C)  TempSrc: Oral Oral  Oral  Resp:  19  20  Height:      Weight:      SpO2:  95% 96% 98%    Intake/Output Summary (Last 24 hours) at 08/07/14 1743 Last data filed at 08/07/14 1632  Gross per 24 hour  Intake    720 ml  Output   1850 ml  Net  -1130 ml   Filed Weights   08/06/14 0832 08/06/14 1251  Weight: 114.76 kg (253 lb) 114.5 kg (252 lb 6.8 oz)     Exam:  General exam: Moderately built and obese pleasant middle-aged female sitting up comfortably in chair. Does not look septic or toxic. Respiratory system: Slightly diminished breath sounds in the bases with occasional crackles but otherwise clear to auscultation. No increased work of breathing. Cardiovascular system: S1 & S2 heard, RRR. No JVD, murmurs, gallops, clicks or pedal edema. Gastrointestinal system: Abdomen is nondistended, soft and nontender. Normal bowel sounds heard. Central nervous system: Alert and oriented. No focal neurological deficits. Extremities: Symmetric 5 x 5 power.   Data Reviewed: Basic Metabolic Panel:  Recent Labs Lab 08/06/14 0851 08/06/14 1600 08/07/14 0531  NA 121* 125* 125*  K 3.8 4.2 4.7  CL 88* 96* 95*  CO2 22 20* 21*  GLUCOSE 106* 74 90  BUN <5* 7 6  CREATININE 0.67  0.64 0.72  CALCIUM 8.9 8.7* 8.5*   Liver Function Tests:  Recent Labs Lab 08/06/14 0851  AST 19  ALT 13*  ALKPHOS 58  BILITOT 0.6  PROT 6.9  ALBUMIN 3.3*   No results for input(s): LIPASE, AMYLASE in the last 168 hours. No results for input(s): AMMONIA in the  last 168 hours. CBC:  Recent Labs Lab 08/06/14 0851 08/07/14 0531  WBC 5.1 4.3  HGB 12.9 12.3  HCT 35.8* 36.0  MCV 81.2 84.3  PLT 202 185   Cardiac Enzymes: No results for input(s): CKTOTAL, CKMB, CKMBINDEX, TROPONINI in the last 168 hours. BNP (last 3 results) No results for input(s): PROBNP in the last 8760 hours. CBG: No results for input(s): GLUCAP in the last 168 hours.  No results found for this or any previous visit (from the past 240 hour(s)).      Studies: Dg Chest 2 View  08/06/2014   CLINICAL DATA:  Fever, pneumonia  EXAM: CHEST  2 VIEW  COMPARISON:  11/17/2010  FINDINGS: Borderline cardiomegaly. There is infiltrate/ pneumonia in right middle lobe and right base. Streaky atelectasis or infiltrate left base. Surgical clips are noted in left axilla. Degenerative changes right shoulder.  IMPRESSION: Infiltrate/pneumonia in right middle lobe and right base. Streaky at atelectasis or infiltrate left base. Follow-up to resolution after appropriate treatment is recommended.   Electronically Signed   By: Lahoma Crocker M.D.   On: 08/06/2014 10:12        Scheduled Meds: . amLODipine  10 mg Oral QPM  . azithromycin  500 mg Intravenous Q24H  . cefTRIAXone (ROCEPHIN)  IV  1 g Intravenous Q24H  . enoxaparin (LOVENOX) injection  40 mg Subcutaneous Q24H  . fluticasone  1 spray Each Nare QHS  . guaiFENesin  1,200 mg Oral BID  . loratadine  10 mg Oral Daily  . losartan  50 mg Oral Daily  . mometasone-formoterol  2 puff Inhalation BID  . pantoprazole  40 mg Oral Daily  . potassium chloride SA  40 mEq Oral TID  . simvastatin  20 mg Oral QPM   Continuous Infusions: . sodium chloride 50 mL/hr at 08/06/14 2038    Principal Problem:   CAP (community acquired pneumonia) Active Problems:   Heart murmur   Hyponatremia   Asthma   Anxiety about health   Hypertension    Time spent: 46 minutes    HONGALGI,ANAND, MD, FACP, FHM. [facility] Pager [phone]  If  7PM-7AM, please contact night-coverage [email or web address] Password [email or web address] 08/07/2014, 5:43 PM    LOS: 1 day

## 2014-08-07 NOTE — Progress Notes (Signed)
Utilization review completed. Rome Echavarria, RN, BSN. 

## 2014-08-08 DIAGNOSIS — R631 Polydipsia: Secondary | ICD-10-CM

## 2014-08-08 DIAGNOSIS — F54 Psychological and behavioral factors associated with disorders or diseases classified elsewhere: Secondary | ICD-10-CM

## 2014-08-08 LAB — BASIC METABOLIC PANEL
ANION GAP: 6 (ref 5–15)
BUN: 9 mg/dL (ref 6–20)
CALCIUM: 8.5 mg/dL — AB (ref 8.9–10.3)
CO2: 21 mmol/L — AB (ref 22–32)
Chloride: 98 mmol/L — ABNORMAL LOW (ref 101–111)
Creatinine, Ser: 0.66 mg/dL (ref 0.44–1.00)
GFR calc Af Amer: >60 mL/min (ref >60)
GLUCOSE: 92 mg/dL (ref 65–99)
POTASSIUM: 5 mmol/L (ref 3.5–5.1)
Sodium: 125 mmol/L — ABNORMAL LOW (ref 135–145)

## 2014-08-08 LAB — SEDIMENTATION RATE: Sed Rate: 60 mm/hr — ABNORMAL HIGH (ref 0–22)

## 2014-08-08 MED ORDER — SENNOSIDES-DOCUSATE SODIUM 8.6-50 MG PO TABS
2.0000 | ORAL_TABLET | Freq: Every day | ORAL | Status: DC
  Administered 2014-08-08 – 2014-08-11 (×2): 2 via ORAL
  Filled 2014-08-08 (×4): qty 2

## 2014-08-08 MED ORDER — AZITHROMYCIN 500 MG PO TABS
500.0000 mg | ORAL_TABLET | Freq: Every day | ORAL | Status: DC
  Administered 2014-08-08 – 2014-08-10 (×3): 500 mg via ORAL
  Filled 2014-08-08 (×5): qty 1

## 2014-08-08 MED ORDER — POTASSIUM CHLORIDE CRYS ER 20 MEQ PO TBCR
40.0000 meq | EXTENDED_RELEASE_TABLET | Freq: Every day | ORAL | Status: DC
Start: 2014-08-08 — End: 2014-08-11
  Administered 2014-08-09 – 2014-08-11 (×3): 40 meq via ORAL
  Filled 2014-08-08 (×3): qty 2

## 2014-08-08 MED ORDER — GUAIFENESIN-DM 100-10 MG/5ML PO SYRP
10.0000 mL | ORAL_SOLUTION | Freq: Four times a day (QID) | ORAL | Status: DC | PRN
  Administered 2014-08-08 – 2014-08-10 (×2): 10 mL via ORAL
  Filled 2014-08-08 (×2): qty 10

## 2014-08-08 MED ORDER — MENTHOL 3 MG MT LOZG
1.0000 | LOZENGE | OROMUCOSAL | Status: DC | PRN
  Filled 2014-08-08 (×2): qty 9

## 2014-08-08 MED ORDER — ACETAMINOPHEN 325 MG PO TABS
650.0000 mg | ORAL_TABLET | Freq: Three times a day (TID) | ORAL | Status: DC
  Administered 2014-08-08 (×3): 650 mg via ORAL
  Filled 2014-08-08 (×3): qty 2

## 2014-08-08 MED ORDER — POLYETHYLENE GLYCOL 3350 17 G PO PACK
17.0000 g | PACK | Freq: Every day | ORAL | Status: DC
Start: 2014-08-08 — End: 2014-08-11
  Administered 2014-08-08 – 2014-08-11 (×2): 17 g via ORAL
  Filled 2014-08-08 (×4): qty 1

## 2014-08-08 NOTE — Progress Notes (Signed)
PROGRESS NOTE    Bridget Cortez HCW:[id number] DOB: [birth year] DOA: 08/06/2014 PCP: Tivis Ringer, MD  HPI/Brief narrative 69 year old female with history of asthma, HTN, breast cancer, recently started on levofloxacin by PCP on Tuesday for complaints of fever, sore throat, productive cough, diaphoresis and pleuritic chest pain, admitted to [facility] on 08/06/14 due to worsening symptoms. In the ED, chest x-ray showed RML and LLL atelectasis versus infiltrate. She had temperature of 100.9, sodium of 121 and was admitted for management of community-acquired pneumonia and hyponatremia.   Assessment/Plan:  Community-acquired pneumonia-RML and and bibasal - Completed 48 hours of outpatient oral levofloxacin without improvement. - Started empirically on IV Rocephin and azithromycin - Improving. Continue management. - Legionella and pneumococcal antigen: Negative. No blood culture or sputum culture sent on admission. - We'll need follow-up chest x-ray in 4-6 weeks to ensure resolution of pneumonia - Add Cepacol lozenges for sore throat and Robitussin-DM for cough - Patient stating that antibiotics are not too strong and she is not better. Advised her that it may take several days for her to get fully better.  Hyponatremia-likely from polydipsia - Sodium 121 on admission. - May be multifactorial secondary to dehydration, HCTZ, excess free water intake - Urine osmolarity 127, serum osmolarity 252: Both low suggesting polydipsia (patient volunteers to drinking obsessively and compulsively excess amount of water because she thinks that it is healthy) - Held HCTZ. DC IV fluids. Fluid restriction. - Sodium improved to 125. Likely subacute. Asymptomatic. - Follow BMP. Stable.  Asthma - Stable. When necessary bronchodilators. No clinical bronchospasm.  Essential hypertension - Controlled - Continue losartan and Norvasc. Hold HCTZ.  Recent right leg pain and muscle spasms -  Resolved.  HLD - Zocor  History of breast cancer  Anxiety disorder/chronic benzodiazepine use - Continue home dose of Valium. Although Valium is listed on her home medications, patient doesn't seem to remember that she is taking it. Requested nursing to verify with family/pharmacy. - Patient seems very anxious.  Left-sided headache, intermittent - Denies visual symptoms. No focal tenderness. - May be related to coughing, sinusitis. Low index of suspicion for temporal arteritis. - ESR 60. - Mild, intermittent. - place on scheduled Tylenol for headache and chest pain with coughing. Reassess in a.m.   DVT prophylaxis: Lovenox Code Status: Full Family Communication: Discussed with spouse at bedside 7/15 Disposition Plan: DC home when medically stable, possibly in 2-3 days.   Consultants:  None  Procedures:  None  Antibiotics:  IV Rocephin 7/14 >  IV azithromycin 7/14 >   Subjective: Has several complaints: Not happy with hospital bed & unable to sleep well last night, does not think antibiotics are strong enough since she is not already better, sore throat is back, intermittent left-sided headache without visual symptoms, constipation (feels this is because she is not getting enough water pressure she drinks at home). No dyspnea. Minimal intermittent dry cough.  Objective: Filed Vitals:   08/07/14 2238 08/08/14 0141 08/08/14 0732 08/08/14 0805  BP: 106/64  106/70   Pulse: 89  93   Temp: 100.2 F (37.9 C) 98 F (36.7 C) 99.1 F (37.3 C)   TempSrc: Oral Oral Oral   Resp: 24  18   Height:      Weight:      SpO2: 100%  98% 99%    Intake/Output Summary (Last 24 hours) at 08/08/14 1436 Last data filed at 08/08/14 0418  Gross per 24 hour  Intake    360 ml  Output  950 ml  Net   -590 ml   Filed Weights   08/06/14 0832 08/06/14 1251  Weight: 114.76 kg (253 lb) 114.5 kg (252 lb 6.8 oz)     Exam:  General exam: Moderately built and obese pleasant  middle-aged female sitting up comfortably in chair. Does not look septic or toxic. Appears anxious. Respiratory system: Clear to auscultation except occasional right basal occasional crackles. No increased work of breathing. Cardiovascular system: S1 & S2 heard, RRR. No JVD, murmurs, gallops, clicks or pedal edema. Gastrointestinal system: Abdomen is nondistended, soft and nontender. Normal bowel sounds heard. Central nervous system: Alert and oriented. No focal neurological deficits. Extremities: Symmetric 5 x 5 power.   Data Reviewed: Basic Metabolic Panel:  Recent Labs Lab 08/06/14 0851 08/06/14 1600 08/07/14 0531 08/08/14 0510  NA 121* 125* 125* 125*  K 3.8 4.2 4.7 5.0  CL 88* 96* 95* 98*  CO2 22 20* 21* 21*  GLUCOSE 106* 74 90 92  BUN <5* 7 6 9   CREATININE 0.67 0.64 0.72 0.66  CALCIUM 8.9 8.7* 8.5* 8.5*   Liver Function Tests:  Recent Labs Lab 08/06/14 0851  AST 19  ALT 13*  ALKPHOS 58  BILITOT 0.6  PROT 6.9  ALBUMIN 3.3*   No results for input(s): LIPASE, AMYLASE in the last 168 hours. No results for input(s): AMMONIA in the last 168 hours. CBC:  Recent Labs Lab 08/06/14 0851 08/07/14 0531  WBC 5.1 4.3  HGB 12.9 12.3  HCT 35.8* 36.0  MCV 81.2 84.3  PLT 202 185   Cardiac Enzymes: No results for input(s): CKTOTAL, CKMB, CKMBINDEX, TROPONINI in the last 168 hours. BNP (last 3 results) No results for input(s): PROBNP in the last 8760 hours. CBG: No results for input(s): GLUCAP in the last 168 hours.  No results found for this or any previous visit (from the past 240 hour(s)).      Studies: No results found.      Scheduled Meds: . acetaminophen  650 mg Oral TID  . amLODipine  10 mg Oral QPM  . azithromycin  500 mg Oral Daily  . cefTRIAXone (ROCEPHIN)  IV  1 g Intravenous Q24H  . diazepam  2.5 mg Oral BID  . enoxaparin (LOVENOX) injection  40 mg Subcutaneous Q24H  . fluticasone  1 spray Each Nare QHS  . guaiFENesin  1,200 mg Oral BID  .  loratadine  10 mg Oral Daily  . losartan  50 mg Oral Daily  . mometasone-formoterol  2 puff Inhalation BID  . pantoprazole  40 mg Oral Daily  . polyethylene glycol  17 g Oral Daily  . potassium chloride SA  40 mEq Oral Daily  . senna-docusate  2 tablet Oral Daily  . simvastatin  20 mg Oral QPM   Continuous Infusions:    Principal Problem:   CAP (community acquired pneumonia) Active Problems:   Heart murmur   Hyponatremia   Asthma   Anxiety about health   Hypertension    Time spent: 31 minutes    Garritt Molyneux, MD, FACP, FHM. [facility] Pager [phone]  If 7PM-7AM, please contact night-coverage [email or web address] Password [email or web address] 08/08/2014, 2:36 PM    LOS: 2 days

## 2014-08-08 NOTE — Evaluation (Signed)
Physical Therapy Evaluation Patient Details Name: Bridget Cortez MRN: [id number] DOB: 08/21/1945 Today's Date: 08/08/2014   History of Present Illness  Pt is a 69 y/o female with a PMH of asthma, chiari malformation, HTN, breast CA, PNA. Pt presents with R sided PNA (aspiration related) and was admitted for further management.   Clinical Impression  Pt admitted with above diagnosis. Pt currently with functional limitations due to the deficits listed below (see PT Problem List). At the time of PT eval pt was able to perform transfers and ambulation with increased time and close guard for safety. Pt was provided with RW for support as NT states that pt was very unsteady walking from the bed to the chair earlier this morning. Pt appears fatigued and requires increased cueing to follow through with tasks as she falls asleep in the middle of a task. Pt will benefit from skilled PT to increase their independence and safety with mobility to allow discharge to the venue listed below.       Follow Up Recommendations Home health PT;Supervision/Assistance - 24 hour    Equipment Recommendations  Rolling walker with 5" wheels (Depending on progress)    Recommendations for Other Services       Precautions / Restrictions Precautions Precautions: Fall Restrictions Weight Bearing Restrictions: No      Mobility  Bed Mobility               General bed mobility comments: Pt sitting up in chair upon PT arrival.   Transfers Overall transfer level: Needs assistance Equipment used: Rolling walker (2 wheeled) Transfers: Sit to/from Stand Sit to Stand: Min guard         General transfer comment: Hands-on guarding as pt powered-up to full standing position. Multiple attempts before she achieved full stand however no physical assist was required.   Ambulation/Gait Ambulation/Gait assistance: Min guard Ambulation Distance (Feet): 25 Feet Assistive device: Rolling walker (2 wheeled) Gait  Pattern/deviations: Step-through pattern;Decreased stride length;Trendelenburg Gait velocity: Decreased Gait velocity interpretation: Below normal speed for age/gender General Gait Details: Pt was able to ambulate in the room only with RW for support. Pt states she is very fatigued, and appeared as if she was about to fall asleep at any moment during gait training.   Stairs            Wheelchair Mobility    Modified Rankin (Stroke Patients Only)       Balance Overall balance assessment: Needs assistance Sitting-balance support: Feet supported;No upper extremity supported Sitting balance-Leahy Scale: Fair   Postural control: Posterior lean Standing balance support: Bilateral upper extremity supported;During functional activity Standing balance-Leahy Scale: Poor                               Pertinent Vitals/Pain Pain Assessment: No/denies pain    Home Living Family/patient expects to be discharged to:: Private residence Living Arrangements: Spouse/significant other Available Help at Discharge: Family;Available 24 hours/day Type of Home: House Home Access: Stairs to enter   [employer] of Steps: 5 through her garage Home Layout: One level Home Equipment: None      Prior Function Level of Independence: Independent               Hand Dominance        Extremity/Trunk Assessment   Upper Extremity Assessment: Defer to OT evaluation           Lower Extremity Assessment: Generalized weakness  Cervical / Trunk Assessment: Normal  Communication   Communication: No difficulties  Cognition Arousal/Alertness: Awake/alert Behavior During Therapy: Anxious Overall Cognitive Status: Within Functional Limits for tasks assessed                      General Comments      Exercises        Assessment/Plan    PT Assessment Patient needs continued PT services  PT Diagnosis Difficulty walking;Generalized weakness    PT Problem List Decreased strength;Decreased range of motion;Decreased activity tolerance;Decreased balance;Decreased mobility;Decreased knowledge of use of DME;Decreased safety awareness;Decreased knowledge of precautions;Cardiopulmonary status limiting activity  PT Treatment Interventions DME instruction;Stair training;Gait training;Functional mobility training;Therapeutic exercise;Therapeutic activities;Neuromuscular re-education;Patient/family education   PT Goals (Current goals can be found in the Care Plan section) Acute Rehab PT Goals Patient Stated Goal: Return to PLOF which was ~2 weeks ago PT Goal Formulation: With patient Time For Goal Achievement: 08/15/14 Potential to Achieve Goals: Good    Frequency Min 3X/week   Barriers to discharge        Co-evaluation               End of Session   Activity Tolerance: Patient limited by fatigue Patient left: in chair;with call bell/phone within reach Nurse Communication: Mobility status         Time: 1610-9604 PT Time Calculation (min) (ACUTE ONLY): 21 min   Charges:   PT Evaluation $Initial PT Evaluation Tier I: 1 Procedure     PT G CodesRolinda Roan 08/23/14, 10:31 AM   Rolinda Roan, PT, DPT Acute Rehabilitation Services Pager: [phone]

## 2014-08-09 LAB — BASIC METABOLIC PANEL
Anion gap: 9 (ref 5–15)
BUN: 10 mg/dL (ref 6–20)
CALCIUM: 8.3 mg/dL — AB (ref 8.9–10.3)
CO2: 20 mmol/L — AB (ref 22–32)
Chloride: 94 mmol/L — ABNORMAL LOW (ref 101–111)
Creatinine, Ser: 0.64 mg/dL (ref 0.44–1.00)
GFR calc Af Amer: >60 mL/min (ref >60)
GFR calc non Af Amer: >60 mL/min (ref >60)
GLUCOSE: 95 mg/dL (ref 65–99)
POTASSIUM: 4.7 mmol/L (ref 3.5–5.1)
Sodium: 123 mmol/L — ABNORMAL LOW (ref 135–145)

## 2014-08-09 NOTE — Progress Notes (Signed)
PROGRESS NOTE    Bridget Cortez HKF:[id number] DOB: 1945/05/24 DOA: 08/06/2014 PCP: Tivis Ringer, MD  HPI/Brief narrative 69 year old female with history of asthma, HTN, breast cancer, recently started on levofloxacin by PCP on Tuesday for complaints of fever, sore throat, productive cough, diaphoresis and pleuritic chest pain, admitted to [facility] on 08/06/14 due to worsening symptoms. In the ED, chest x-ray showed RML and LLL atelectasis versus infiltrate. She had temperature of 100.9, sodium of 121 and was admitted for management of community-acquired pneumonia and hyponatremia.   Assessment/Plan:  Community-acquired pneumonia-RML and and bibasal - Completed 48 hours of outpatient oral levofloxacin without improvement. - Started empirically on IV Rocephin and azithromycin - Improving. Continue management. - Legionella and pneumococcal antigen: Negative. No blood culture or sputum culture sent on admission. - We'll need follow-up chest x-ray in 4-6 weeks to ensure resolution of pneumonia - Add Cepacol lozenges for sore throat and Robitussin-DM for cough - Patient stating that antibiotics are not too strong and she is not better. Advised her that it may take several days for her to get fully better. - Improving. Continue IV antibiotics for additional 24 hours and then consider DC home on 7/18 on by mouth antibiotics.  Hyponatremia-likely from polydipsia - Sodium 121 on admission. - May be multifactorial secondary to dehydration, HCTZ, excess free water intake - Urine osmolarity 127, serum osmolarity 252: Both low suggesting polydipsia (patient volunteers to drinking obsessively and compulsively excess amount of water because she thinks that it is healthy) - Held HCTZ. DC IV fluids. Fluid restriction. - Sodium improved to 125. Likely subacute. Asymptomatic. - Follow BMP. Sodium has dropped slightly to 123. As per patient and nursing, fluid restriction being followed.? Taking longer  due to being on Maxide. - Follow BMP in a.m. Consider discussing with patient's PCP in a.m.  Asthma - Stable. When necessary bronchodilators. No clinical bronchospasm.  Essential hypertension - Controlled - Continue losartan and Norvasc. Hold HCTZ.  Recent right leg pain and muscle spasms - Resolved.  HLD - Zocor  History of breast cancer  Anxiety disorder/chronic benzodiazepine use - Continue home dose of Valium. Although Valium is listed on her home medications, patient doesn't seem to remember that she is taking it. Requested nursing to verify with family/pharmacy. - Patient seems less anxious today. Daughter by bedside.  Left-sided headache, intermittent - Denies visual symptoms. No focal tenderness. - May be related to coughing, sinusitis. Low index of suspicion for temporal arteritis. - ESR 60. - Mild, intermittent. - place on scheduled Tylenol for headache and chest pain with coughing. Improved. Headache resolved.   DVT prophylaxis: Lovenox Code Status: Full Family Communication: Discussed with patient's daughter at bedside 7/17 Disposition Plan: DC home when medically stable, possibly 7/18.   Consultants:  None  Procedures:  None  Antibiotics:  IV Rocephin 7/14 >  IV azithromycin 7/14 >   Subjective: Feels better. Dry cough-decreased. Intermittent chest pain only on coughing. No headache reported. Flatus + +. Sore throat improved.  Objective: Filed Vitals:   08/08/14 1509 08/08/14 2148 08/09/14 0450 08/09/14 0815  BP: 93/68 115/73 113/67   Pulse: 85 93 91   Temp: 98.4 F (36.9 C) 98.8 F (37.1 C) 98.5 F (36.9 C)   TempSrc: Oral Oral Oral   Resp: _0 Height:      Weight:      SpO2: 99% 100% 98% 95%    Intake/Output Summary (Last 24 hours) at 08/09/14 1603 Last data filed at 08/09/14  1345  Gross per 24 hour  Intake   1090 ml  Output   1050 ml  Net     40 ml   Filed Weights   08/06/14 0832 08/06/14 1251  Weight: 114.76 kg (253  lb) 114.5 kg (252 lb 6.8 oz)     Exam:  General exam: Moderately built and obese pleasant middle-aged female sitting up comfortably in chair. Does not look septic or toxic.  Respiratory system: Clear to auscultation. No increased work of breathing. Cardiovascular system: S1 & S2 heard, RRR. No JVD, murmurs, gallops, clicks or pedal edema. Gastrointestinal system: Abdomen is nondistended, soft and nontender. Normal bowel sounds heard. Central nervous system: Alert and oriented. No focal neurological deficits. Extremities: Symmetric 5 x 5 power.   Data Reviewed: Basic Metabolic Panel:  Recent Labs Lab 08/06/14 0851 08/06/14 1600 08/07/14 0531 08/08/14 0510 08/09/14 0330  NA 121* 125* 125* 125* 123*  K 3.8 4.2 4.7 5.0 4.7  CL 88* 96* 95* 98* 94*  CO2 22 20* 21* 21* 20*  GLUCOSE 106* 74 90 92 95  BUN <5* _0 CREATININE 0.67 0.64 0.72 0.66 0.64  CALCIUM 8.9 8.7* 8.5* 8.5* 8.3*   Liver Function Tests:  Recent Labs Lab 08/06/14 0851  AST 19  ALT 13*  ALKPHOS 58  BILITOT 0.6  PROT 6.9  ALBUMIN 3.3*   No results for input(s): LIPASE, AMYLASE in the last 168 hours. No results for input(s): AMMONIA in the last 168 hours. CBC:  Recent Labs Lab 08/06/14 0851 08/07/14 0531  WBC 5.1 4.3  HGB 12.9 12.3  HCT 35.8* 36.0  MCV 81.2 84.3  PLT 202 185   Cardiac Enzymes: No results for input(s): CKTOTAL, CKMB, CKMBINDEX, TROPONINI in the last 168 hours. BNP (last 3 results) No results for input(s): PROBNP in the last 8760 hours. CBG: No results for input(s): GLUCAP in the last 168 hours.  No results found for this or any previous visit (from the past 240 hour(s)).      Studies: No results found.      Scheduled Meds: . amLODipine  10 mg Oral QPM  . azithromycin  500 mg Oral Daily  . cefTRIAXone (ROCEPHIN)  IV  1 g Intravenous Q24H  . diazepam  2.5 mg Oral BID  . enoxaparin (LOVENOX) injection  40 mg Subcutaneous Q24H  . fluticasone  1 spray Each Nare  QHS  . guaiFENesin  1,200 mg Oral BID  . loratadine  10 mg Oral Daily  . losartan  50 mg Oral Daily  . mometasone-formoterol  2 puff Inhalation BID  . pantoprazole  40 mg Oral Daily  . polyethylene glycol  17 g Oral Daily  . potassium chloride SA  40 mEq Oral Daily  . senna-docusate  2 tablet Oral Daily  . simvastatin  20 mg Oral QPM   Continuous Infusions:    Principal Problem:   CAP (community acquired pneumonia) Active Problems:   Heart murmur   Hyponatremia   Asthma   Anxiety about health   Hypertension    Time spent: 20 minutes    Faduma Cho, MD, FACP, FHM. [facility] Pager [phone]  If 7PM-7AM, please contact night-coverage [email or web address] Password [email or web address] 08/09/2014, 4:03 PM    LOS: 3 days

## 2014-08-09 NOTE — Progress Notes (Signed)
Patient temperature 100.7 . Tylenol 650 mg prn given per NP. Incentive spirometer provided to patient , patient able to perform teachback. Rogue Bussing, NP notified. Orders placed.

## 2014-08-10 ENCOUNTER — Inpatient Hospital Stay (HOSPITAL_COMMUNITY): Payer: Medicare Other

## 2014-08-10 LAB — BASIC METABOLIC PANEL
ANION GAP: 9 (ref 5–15)
BUN: 6 mg/dL (ref 6–20)
CALCIUM: 8.6 mg/dL — AB (ref 8.9–10.3)
CHLORIDE: 96 mmol/L — AB (ref 101–111)
CO2: 23 mmol/L (ref 22–32)
Creatinine, Ser: 0.6 mg/dL (ref 0.44–1.00)
GFR calc Af Amer: >60 mL/min (ref >60)
Glucose, Bld: 90 mg/dL (ref 65–99)
Potassium: 4.3 mmol/L (ref 3.5–5.1)
Sodium: 128 mmol/L — ABNORMAL LOW (ref 135–145)

## 2014-08-10 NOTE — Care Management Note (Signed)
Case Management Note  Patient Details  Name: Bridget Cortez MRN: [id number] Date of Birth: 10-01-1945  Subjective/Objective:      NCM spoke with patient , she chose [facility] for [facility], PT, and aide, RN for PNA disease management.  Referral made to [place] with [facility], Soc will begin 24-48 hrs post dc.  Also Jermaine with [facility] notified for rolling walker.  Patient for possible dc tomorrow.                Action/Plan:   Expected Discharge Date:                  Expected Discharge Plan:  [place]  In-House Referral:     Discharge planning Services  CM Consult  Post Acute Care Choice:  Durable Medical Equipment Choice offered to:  Patient  DME Arranged:  Walker rolling DME Agency:  [place] Arranged:  RN, PT, Nurse's Aide [place] Agency:  [place]  Status of Service:  Completed, signed off  Medicare Important Message Given:  Yes-second notification given Date Medicare IM Given:    Medicare IM give by:    Date Additional Medicare IM Given:    Additional Medicare Important Message give by:     If discussed at [place] of Stay Meetings, dates discussed:    Additional Comments:  Zenon Mayo, RN 08/10/2014, 4:48 PM

## 2014-08-10 NOTE — Progress Notes (Signed)
PROGRESS NOTE    Bridget Cortez PJA:[id number] DOB: 09/07/1945 DOA: 08/06/2014 PCP: Tivis Ringer, MD  HPI/Brief narrative 69 year old female with history of asthma, HTN, breast cancer, recently started on levofloxacin by PCP on Tuesday for complaints of fever, sore throat, productive cough, diaphoresis and pleuritic chest pain, admitted to [facility] on 08/06/14 due to worsening symptoms. In the ED, chest x-ray showed RML and LLL atelectasis versus infiltrate. She had temperature of 100.9, sodium of 121 and was admitted for management of community-acquired pneumonia and hyponatremia.   Assessment/Plan:  Community-acquired pneumonia-RML and and bibasal - Completed 48 hours of outpatient oral levofloxacin without improvement. - Started empirically on IV Rocephin and azithromycin - Improving. Continue management. - Legionella and pneumococcal antigen: Negative. No blood culture or sputum culture sent on admission. - We'll need follow-up chest x-ray in 4-6 weeks to ensure resolution of pneumonia - Add Cepacol lozenges for sore throat and Robitussin-DM for cough - Patient stating that antibiotics are not too strong and she is not better. Advised her that it may take several days for her to get fully better. - Low-grade fever of 100.7 last night. Chest x-ray stable. Will continue IV antibiotics for additional 24 hours and plan to discharge on 7/19 in the absence of recurrent high fevers.  Hyponatremia-likely from polydipsia - Sodium 121 on admission. - May be multifactorial secondary to dehydration, HCTZ, excess free water intake - Urine osmolarity 127, serum osmolarity 252: Both low suggesting polydipsia (patient volunteers to drinking obsessively and compulsively excess amount of water because she thinks that it is healthy) - Held HCTZ. DC IV fluids. Fluid restriction. - Likely subacute/chronic. Asymptomatic. Sodium improved to 128 on 7/18. Outpatient follow-up with PCP upon  discharge.  Asthma - Stable. When necessary bronchodilators. No clinical bronchospasm.  Essential hypertension - Controlled - Continue losartan and Norvasc. Hold HCTZ.  Recent right leg pain and muscle spasms - Resolved.  HLD - Zocor  History of breast cancer  Anxiety disorder/chronic benzodiazepine use - Continue home dose of Valium.   Left-sided headache, intermittent - Denies visual symptoms. No focal tenderness. - May be related to coughing, sinusitis. Low index of suspicion for temporal arteritis. - ESR 60. - Mild, intermittent. - place on scheduled Tylenol for headache and chest pain with coughing. Improved. Headache resolved.   DVT prophylaxis: Lovenox Code Status: Full Family Communication: Discussed with patient's daughter at bedside 7/17. None at bedside today. Disposition Plan: DC home when medically stable, possibly 7/19.   Consultants:  None  Procedures:  None  Antibiotics:  IV Rocephin 7/14 >  azithromycin 7/14 >   Subjective: Low-grade fever last night. Not many complaints today. No chest pain, headache, dyspnea or cough reported. Mild sore throat. Mild DOE but states that it is improving and was able to ambulate halls with her daughter yesterday.  Objective: Filed Vitals:   08/09/14 2337 08/10/14 0503 08/10/14 0946 08/10/14 1449  BP:  127/68  111/67  Pulse:  102  87  Temp: 99.3 F (37.4 C) 99.3 F (37.4 C)  99.9 F (37.7 C)  TempSrc: Oral Oral  Oral  Resp:  20  24  Height:      Weight:      SpO2:  100% 94% 57%   oxygen saturation of 57% is most likely an error. Will have nursing recheck.  Intake/Output Summary (Last 24 hours) at 08/10/14 1656 Last data filed at 08/10/14 1350  Gross per 24 hour  Intake    680 ml  Output  1450 ml  Net   -770 ml   Filed Weights   08/06/14 0832 08/06/14 1251  Weight: 114.76 kg (253 lb) 114.5 kg (252 lb 6.8 oz)     Exam:  General exam: Moderately built and obese pleasant middle-aged female  sitting up comfortably in chair. Does not look septic or toxic.  Respiratory system: Clear to auscultation except slightly diminished breath sounds in the bases. No increased work of breathing. Cardiovascular system: S1 & S2 heard, RRR. No JVD, murmurs, gallops, clicks or pedal edema. Gastrointestinal system: Abdomen is nondistended, soft and nontender. Normal bowel sounds heard. Central nervous system: Alert and oriented. No focal neurological deficits. Extremities: Symmetric 5 x 5 power.   Data Reviewed: Basic Metabolic Panel:  Recent Labs Lab 08/06/14 1600 08/07/14 0531 08/08/14 0510 08/09/14 0330 08/10/14 0441  NA 125* 125* 125* 123* 128*  K 4.2 4.7 5.0 4.7 4.3  CL 96* 95* 98* 94* 96*  CO2 20* 21* 21* 20* 23  GLUCOSE 74 90 92 95 90  BUN 7 6 9 10 6   CREATININE 0.64 0.72 0.66 0.64 0.60  CALCIUM 8.7* 8.5* 8.5* 8.3* 8.6*   Liver Function Tests:  Recent Labs Lab 08/06/14 0851  AST 19  ALT 13*  ALKPHOS 58  BILITOT 0.6  PROT 6.9  ALBUMIN 3.3*   No results for input(s): LIPASE, AMYLASE in the last 168 hours. No results for input(s): AMMONIA in the last 168 hours. CBC:  Recent Labs Lab 08/06/14 0851 08/07/14 0531  WBC 5.1 4.3  HGB 12.9 12.3  HCT 35.8* 36.0  MCV 81.2 84.3  PLT 202 185   Cardiac Enzymes: No results for input(s): CKTOTAL, CKMB, CKMBINDEX, TROPONINI in the last 168 hours. BNP (last 3 results) No results for input(s): PROBNP in the last 8760 hours. CBG: No results for input(s): GLUCAP in the last 168 hours.  Recent Results (from the past 240 hour(s))  Culture, blood (routine x 2)     Status: None (Preliminary result)   Collection Time: 08/09/14  9:53 PM  Result Value Ref Range Status   Specimen Description BLOOD RIGHT ANTECUBITAL  Final   Special Requests BOTTLES DRAWN AEROBIC AND ANAEROBIC 10CC EA  Final   Culture NO GROWTH < 24 HOURS  Final   Report Status PENDING  Incomplete  Culture, blood (routine x 2)     Status: None (Preliminary  result)   Collection Time: 08/09/14 10:02 PM  Result Value Ref Range Status   Specimen Description BLOOD RIGHT HAND  Final   Special Requests IN PEDIATRIC BOTTLE 2CC  Final   Culture NO GROWTH < 24 HOURS  Final   Report Status PENDING  Incomplete        Studies: Dg Chest 2 View  08/10/2014   CLINICAL DATA:  Pneumonia.  EXAM: CHEST  2 VIEW  COMPARISON:  August 06, 2014.  FINDINGS: Stable cardiomegaly. No pneumothorax is noted. Mild right basilar opacity is noted concerning for pneumonia or atelectasis. Stable mild left basilar subsegmental atelectasis is noted. No significant pleural effusion is noted. Severe degenerative changes seen involving the right glenohumeral joint, as well as narrowing of right subacromial space suggesting rotator cuff injury.  IMPRESSION: Stable right basilar opacity concerning for pneumonia or atelectasis. Stable mild left basilar subsegmental atelectasis.   Electronically Signed   By: Marijo Conception, M.D.   On: 08/10/2014 08:49        Scheduled Meds: . amLODipine  10 mg Oral QPM  . azithromycin  500 mg  Oral Daily  . cefTRIAXone (ROCEPHIN)  IV  1 g Intravenous Q24H  . diazepam  2.5 mg Oral BID  . enoxaparin (LOVENOX) injection  40 mg Subcutaneous Q24H  . fluticasone  1 spray Each Nare QHS  . guaiFENesin  1,200 mg Oral BID  . loratadine  10 mg Oral Daily  . losartan  50 mg Oral Daily  . mometasone-formoterol  2 puff Inhalation BID  . pantoprazole  40 mg Oral Daily  . polyethylene glycol  17 g Oral Daily  . potassium chloride SA  40 mEq Oral Daily  . senna-docusate  2 tablet Oral Daily  . simvastatin  20 mg Oral QPM   Continuous Infusions:    Principal Problem:   CAP (community acquired pneumonia) Active Problems:   Heart murmur   Hyponatremia   Asthma   Anxiety about health   Hypertension    Time spent: 20 minutes    Gray Doering, MD, FACP, FHM. [facility] Pager [phone]  If 7PM-7AM, please contact  night-coverage [email or web address] Password [email or web address] 08/10/2014, 4:56 PM    LOS: 4 days

## 2014-08-10 NOTE — Care Management Important Message (Signed)
Important Message  Patient Details  Name: Bridget Cortez MRN: [id number] Date of Birth: 1945/08/25   Medicare Important Message Given:  Yes-second notification given    Pricilla Handler 08/10/2014, 3:21 PM

## 2014-08-10 NOTE — Progress Notes (Signed)
Physical Therapy Treatment Patient Details Name: MAIZEE REINHOLD MRN: [id number] DOB: 05/28/1945 Today's Date: 08/10/2014    History of Present Illness Pt is a 69 y/o female with a PMH of asthma, chiari malformation, HTN, breast CA, PNA. Pt presents with R sided PNA (aspiration related) and was admitted for further management.     PT Comments    Patient progressing slowly towards PT goals. Improved ambulation distance from prior session. Encouraged use of RW for energy conservation as pt takes multiple short standing rest breaks due to fatigue. Very slow gait speed. Will continue to follow per current POC to maximize independence.    Follow Up Recommendations  Home health PT;Supervision/Assistance - 24 hour     Equipment Recommendations  Rolling walker with 5" wheels    Recommendations for Other Services       Precautions / Restrictions Precautions Precautions: Fall Restrictions Weight Bearing Restrictions: No    Mobility  Bed Mobility               General bed mobility comments: Pt sitting up in chair upon PT arrival.   Transfers Overall transfer level: Needs assistance Equipment used: Rolling walker (2 wheeled) Transfers: Sit to/from Stand Sit to Stand: Min guard         General transfer comment: Min guard for safety. Good demo of hand placement/technique.  Ambulation/Gait Ambulation/Gait assistance: Min guard Ambulation Distance (Feet): 150 Feet Assistive device: Rolling walker (2 wheeled) Gait Pattern/deviations: Step-through pattern;Decreased stride length;Trendelenburg Gait velocity: Decreased Gait velocity interpretation: Below normal speed for age/gender General Gait Details: Multiple short standing rest breaks due to fatigue. Cues for RW management.    Stairs            Wheelchair Mobility    Modified Rankin (Stroke Patients Only)       Balance Overall balance assessment: Needs assistance Sitting-balance support: Feet  supported;No upper extremity supported Sitting balance-Leahy Scale: Fair Sitting balance - Comments: Total A to adjust socks.   Standing balance support: During functional activity Standing balance-Leahy Scale: Fair Standing balance comment: Ambulated short distance within room back to chair with furniture support.                     Cognition Arousal/Alertness: Awake/alert Behavior During Therapy: WFL for tasks assessed/performed Overall Cognitive Status: Within Functional Limits for tasks assessed                      Exercises      General Comments        Pertinent Vitals/Pain Pain Assessment: No/denies pain    Home Living                      Prior Function            PT Goals (current goals can now be found in the care plan section) Progress towards PT goals: Progressing toward goals    Frequency  Min 3X/week    PT Plan Current plan remains appropriate    Co-evaluation             End of Session Equipment Utilized During Treatment: Gait belt Activity Tolerance: Patient limited by fatigue Patient left: in chair;with call bell/phone within reach;with chair alarm set     Time: 9528-4132 PT Time Calculation (min) (ACUTE ONLY): 16 min  Charges:  $Gait Training: 8-22 mins  G Codes:      Christin Mccreedy A Robi Mitter 08/10/2014, 10:20 AM Wray Kearns, PT, DPT [phone]

## 2014-08-11 MED ORDER — MENTHOL 3 MG MT LOZG
1.0000 | LOZENGE | OROMUCOSAL | Status: DC | PRN
Start: 2014-08-11 — End: 2017-08-03

## 2014-08-11 MED ORDER — GUAIFENESIN-DM 100-10 MG/5ML PO SYRP: 10.0000 mL | ORAL_SOLUTION | Freq: Three times a day (TID) | ORAL | Status: DC | PRN

## 2014-08-11 MED ORDER — ACETAMINOPHEN 325 MG PO TABS: 650.0000 mg | ORAL_TABLET | Freq: Four times a day (QID) | ORAL | Status: DC | PRN

## 2014-08-11 MED ORDER — DIAZEPAM 5 MG PO TABS: 2.5000 mg | ORAL_TABLET | Freq: Two times a day (BID) | ORAL | Status: DC

## 2014-08-11 MED ORDER — AZITHROMYCIN 500 MG PO TABS
500.0000 mg | ORAL_TABLET | Freq: Once | ORAL | Status: AC
  Administered 2014-08-11: 500 mg via ORAL
  Filled 2014-08-11: qty 1

## 2014-08-11 MED ORDER — POTASSIUM CHLORIDE CRYS ER 20 MEQ PO TBCR: 40.0000 meq | EXTENDED_RELEASE_TABLET | Freq: Every day | ORAL | Status: DC

## 2014-08-11 MED ORDER — AMOXICILLIN-POT CLAVULANATE 875-125 MG PO TABS
1.0000 | ORAL_TABLET | Freq: Two times a day (BID) | ORAL | Status: DC
Start: 2014-08-12 — End: 2015-02-16

## 2014-08-11 NOTE — Progress Notes (Signed)
Reviewed discharge paperwork with husband and pt.  PIV removed.  Pt taken to discharge location via wheelchair.

## 2014-08-11 NOTE — Discharge Instructions (Signed)
Pneumonia Pneumonia is an infection of the lungs.  CAUSES Pneumonia may be caused by bacteria or a virus. Usually, these infections are caused by breathing infectious particles into the lungs (respiratory tract). SIGNS AND SYMPTOMS   Cough.  Fever.  Chest pain.  Increased rate of breathing.  Wheezing.  Mucus production. DIAGNOSIS  If you have the common symptoms of pneumonia, your health care provider will typically confirm the diagnosis with a chest X-ray. The X-ray will show an abnormality in the lung (pulmonary infiltrate) if you have pneumonia. Other tests of your blood, urine, or sputum may be done to find the specific cause of your pneumonia. Your health care provider may also do tests (blood gases or pulse oximetry) to see how well your lungs are working. TREATMENT  Some forms of pneumonia may be spread to other people when you cough or sneeze. You may be asked to wear a mask before and during your exam. Pneumonia that is caused by bacteria is treated with antibiotic medicine. Pneumonia that is caused by the influenza virus may be treated with an antiviral medicine. Most other viral infections must run their course. These infections will not respond to antibiotics.  HOME CARE INSTRUCTIONS   Cough suppressants may be used if you are losing too much rest. However, coughing protects you by clearing your lungs. You should avoid using cough suppressants if you can.  Your health care provider may have prescribed medicine if he or she thinks your pneumonia is caused by bacteria or influenza. Finish your medicine even if you start to feel better.  Your health care provider may also prescribe an expectorant. This loosens the mucus to be coughed up.  Take medicines only as directed by your health care provider.  Do not smoke. Smoking is a common cause of bronchitis and can contribute to pneumonia. If you are a smoker and continue to smoke, your cough may last several weeks after your  pneumonia has cleared.  A cold steam vaporizer or humidifier in your room or home may help loosen mucus.  Coughing is often worse at night. Sleeping in a semi-upright position in a recliner or using a couple pillows under your head will help with this.  Get rest as you feel it is needed. Your body will usually let you know when you need to rest. PREVENTION A pneumococcal shot (vaccine) is available to prevent a common bacterial cause of pneumonia. This is usually suggested for:  People over 65 years old.  Patients on chemotherapy.  People with chronic lung problems, such as bronchitis or emphysema.  People with immune system problems. If you are over 65 or have a high risk condition, you may receive the pneumococcal vaccine if you have not received it before. In some countries, a routine influenza vaccine is also recommended. This vaccine can help prevent some cases of pneumonia.You may be offered the influenza vaccine as part of your care. If you smoke, it is time to quit. You may receive instructions on how to stop smoking. Your health care provider can provide medicines and counseling to help you quit. SEEK MEDICAL CARE IF: You have a fever. SEEK IMMEDIATE MEDICAL CARE IF:   Your illness becomes worse. This is especially true if you are elderly or weakened from any other disease.  You cannot control your cough with suppressants and are losing sleep.  You begin coughing up blood.  You develop pain which is getting worse or is uncontrolled with medicines.  Any of the symptoms   which initially brought you in for treatment are getting worse rather than better.  You develop shortness of breath or chest pain. MAKE SURE YOU:   Understand these instructions.  Will watch your condition.  Will get help right away if you are not doing well or get worse. Document Released: 01/09/2005 Document Revised: 05/26/2013 Document Reviewed: 03/31/2010 [facility] Patient Information 2015  [place], [place]. This information is not intended to replace advice given to you by your health care provider. Make sure you discuss any questions you have with your health care provider.   Hyponatremia  Hyponatremia is when the amount of salt (sodium) in your blood is too low. When sodium levels are low, your cells will absorb extra water and swell. The swelling happens throughout the body, but it mostly affects the brain. Severe brain swelling (cerebral edema), seizures, or coma can happen.  CAUSES   Heart, kidney, or liver problems.  Thyroid problems.  Adrenal gland problems.  Severe vomiting and diarrhea.  Certain medicines or illegal drugs.  Dehydration.  Drinking too much water.  Low-sodium diet. SYMPTOMS   Nausea and vomiting.  Confusion.  Lethargy.  Agitation.  Headache.  Twitching or shaking (seizures).  Unconsciousness.  Appetite loss.  Muscle weakness and cramping. DIAGNOSIS  Hyponatremia is identified by a simple blood test. Your caregiver will perform a history and physical exam to try to find the cause and type of hyponatremia. Other tests may be needed to measure the amount of sodium in your blood and urine. TREATMENT  Treatment will depend on the cause.   Fluids may be given through the vein (IV).  Medicines may be used to correct the sodium imbalance. If medicines are causing the problem, they will need to be adjusted.  Water or fluid intake may be restricted to restore proper balance. The speed of correcting the sodium problem is very important. If the problem is corrected too fast, nerve damage (sometimes unchangeable) can happen. HOME CARE INSTRUCTIONS   Only take medicines as directed by your caregiver. Many medicines can make hyponatremia worse. Discuss all your medicines with your caregiver.  Carefully follow any recommended diet, including any fluid restrictions.  You may be asked to repeat lab tests. Follow these directions.  Avoid  alcohol and recreational drugs. SEEK MEDICAL CARE IF:   You develop worsening nausea, fatigue, headache, confusion, or weakness.  Your original hyponatremia symptoms return.  You have problems following the recommended diet. SEEK IMMEDIATE MEDICAL CARE IF:   You have a seizure.  You faint.  You have ongoing diarrhea or vomiting. MAKE SURE YOU:   Understand these instructions.  Will watch your condition.  Will get help right away if you are not doing well or get worse. Document Released: 12/30/2001 Document Revised: 04/03/2011 Document Reviewed: 06/26/2010 [facility] Patient Information 2015 [place], [place]. This information is not intended to replace advice given to you by your health care provider. Make sure you discuss any questions you have with your health care provider.

## 2014-08-11 NOTE — Discharge Summary (Addendum)
Physician Discharge Summary  Bridget Cortez STM:[id number] DOB: 12/13/1945 DOA: 08/06/2014  PCP: Tivis Ringer, MD  Admit date: 08/06/2014 Discharge date: 08/11/2014  Time spent: Greater than 30 minutes  Recommendations for Outpatient Follow-up:  1. Dr. Jonna Coup, PCP in 3 days with repeat labs (CBC & BMP). Recommend follow-up chest x-ray in 4-6 weeks. Please follow final blood culture results that were sent from the hospital. 2. Home health RN, PT, Aide & rolling walker with 5 inch wheels.  Discharge Diagnoses:  Principal Problem:   CAP (community acquired pneumonia) Active Problems:   Heart murmur   Hyponatremia   Asthma   Anxiety about health   Hypertension   Discharge Condition: Improved & Stable  Diet recommendation: Heart healthy diet. Fluid restriction: 1500 mL per day has been counseled to patient and spouse.  Filed Weights   08/06/14 0832 08/06/14 1251  Weight: 114.76 kg (253 lb) 114.5 kg (252 lb 6.8 oz)    History of present illness:  69 year old female with history of asthma, HTN, breast cancer, recently started on levofloxacin by PCP on Tuesday for complaints of fever, sore throat, productive cough, diaphoresis and pleuritic chest pain, admitted to [facility] on 08/06/14 due to worsening symptoms. In the ED, chest x-ray showed RML and LLL atelectasis versus infiltrate. She had temperature of 100.9, sodium of 121 and was admitted for management of community-acquired pneumonia and hyponatremia.  Hospital Course:   Community-acquired pneumonia-RML and and bibasal - Completed 48 hours of outpatient oral levofloxacin without improvement. - Started empirically on IV Rocephin and azithromycin - Legionella and pneumococcal antigen: Negative. Blood cultures 2: Negative to date - Will need follow-up chest x-ray in 4-6 weeks to ensure resolution of pneumonia - Repeat Chest x-ray stable.  - Patient had low-grade fever night before last but none since. Clinically she  has progressively improved. - Patient continues to use incentive spirometry. - She will complete 6 days of IV Rocephin and IV/oral azithromycin today. She will be discharged on oral Augmentin for additional day to complete total 7 days of antibiotics.  Hyponatremia-likely from polydipsia & Maxzide - Sodium 121 on admission. - May be multifactorial secondary to dehydration, Maxide and psychogenic polydipsia - Urine osmolarity 127, serum osmolarity 252: Both low suggesting polydipsia (patient volunteers to drinking obsessively and compulsively excess amount of water because she thinks that it is healthy) - Discontinued Maxide. Fluid restriction has been repeatedly counseled to patient and family. - Likely subacute/chronic. Asymptomatic. Sodium improved to 128 on 7/18. Outpatient follow-up with PCP upon discharge. - Advised to follow-up with PCP regarding starting low-dose Lasix for leg edema if they continue to worsen and if sodium remains stable.  Asthma - Stable. When necessary bronchodilators. No clinical bronchospasm.  Essential hypertension - Controlled - Continue losartan and Norvasc. Discontinued Maxide.  Recent right leg pain and muscle spasms - Resolved.  HLD - Zocor  History of breast cancer  Anxiety disorder/chronic benzodiazepine use - Continue home dose of Valium.   Left-sided headache, intermittent - Denies visual symptoms. No focal tenderness. - May be related to coughing, sinusitis. Low index of suspicion for temporal arteritis. - ESR 60. - Mild, intermittent but significantly improved.    Consultants:  None  Procedures:  None    Discharge Exam:  Complaints:  Overall continues to feel better. Occasional dry cough. No dyspnea reported. No chest pain. Sore throat improved. Occasional mild left temporal headache but improved. Had a large BM on 7/17. Mild ankle swellings  Filed Vitals:   08/10/14  2108 08/10/14 2202 08/11/14 0459 08/11/14 0858  BP:  116/61  118/71   Pulse: 90 90 89   Temp: 99.4 F (37.4 C)  98.6 F (37 C)   TempSrc: Oral  Oral   Resp: 20 16 18    Height:      Weight:      SpO2: 98%  99% 96%    General exam: Moderately built and obese pleasant middle-aged female ambulating comfortably with supervision in room this morning. Respiratory system: Clear to auscultation. No increased work of breathing. Cardiovascular system: S1 & S2 heard, RRR. No JVD, murmurs, gallops, clicks. Trace bilateral ankle/dorsal feet edema. Gastrointestinal system: Abdomen is nondistended, soft and nontender. Normal bowel sounds heard. Central nervous system: Alert and oriented. No focal neurological deficits. Extremities: Symmetric 5 x 5 power.  Discharge Instructions      Discharge Instructions    Call MD for:  difficulty breathing, headache or visual disturbances    Complete by:  As directed      Call MD for:  extreme fatigue    Complete by:  As directed      Call MD for:  hives    Complete by:  As directed      Call MD for:  persistant dizziness or light-headedness    Complete by:  As directed      Call MD for:  persistant nausea and vomiting    Complete by:  As directed      Call MD for:  severe uncontrolled pain    Complete by:  As directed      Call MD for:  temperature >100.4    Complete by:  As directed      Diet - low sodium heart healthy    Complete by:  As directed      Discharge instructions    Complete by:  As directed   Restrict fluids to 1500 mL per day.     Increase activity slowly    Complete by:  As directed             Medication List    STOP taking these medications        triamterene-hydrochlorothiazide 75-50 MG per tablet  Commonly known as:  MAXZIDE      TAKE these medications        acetaminophen 325 MG tablet  Commonly known as:  TYLENOL  Take 2 tablets (650 mg total) by mouth every 6 (six) hours as needed for mild pain, fever or headache (or Fever >/= 101).     albuterol (2.5 MG/3ML)  0.083% nebulizer solution  Commonly known as:  PROVENTIL  Take 2.5 mg by nebulization every 6 (six) hours as needed. For shortness of breath     amLODipine 10 MG tablet  Commonly known as:  NORVASC  Take 10 mg by mouth every evening.     amoxicillin-clavulanate 875-125 MG per tablet  Commonly known as:  AUGMENTIN  Take 1 tablet by mouth 2 (two) times daily. Starting 08/12/14.  Start taking on:  08/12/2014     CALTRATE 600+D PO  Take 1 tablet by mouth daily.     celecoxib 200 MG capsule  Commonly known as:  CELEBREX  Take 200 mg by mouth daily as needed for moderate pain.     cetirizine 10 MG tablet  Commonly known as:  ZYRTEC  Take 10 mg by mouth daily.     cholecalciferol 1000 UNITS tablet  Commonly known as:  VITAMIN D  Take 2,000  Units by mouth daily.     diazepam 5 MG tablet  Commonly known as:  VALIUM  Take 0.5 tablets (2.5 mg total) by mouth 2 (two) times daily.     esomeprazole 40 MG capsule  Commonly known as:  NEXIUM  Take 40 mg by mouth daily before breakfast.     fluticasone 50 MCG/ACT nasal spray  Commonly known as:  FLONASE  Place 1 spray into the nose at bedtime.     Fluticasone-Salmeterol 250-50 MCG/DOSE Aepb  Commonly known as:  ADVAIR  Inhale 1 puff into the lungs every 12 (twelve) hours.     guaiFENesin-dextromethorphan 100-10 MG/5ML syrup  Commonly known as:  ROBITUSSIN DM  Take 10 mLs by mouth every 8 (eight) hours as needed for cough.     losartan 50 MG tablet  Commonly known as:  COZAAR  Take 50 mg by mouth daily.     menthol-cetylpyridinium 3 MG lozenge  Commonly known as:  CEPACOL  Take 1 lozenge (3 mg total) by mouth as needed for sore throat.     multivitamin tablet  Take 1 tablet by mouth daily.     nystatin 100000 UNIT/ML suspension  Commonly known as:  MYCOSTATIN  Take 5 mLs by mouth 4 (four) times daily.     polyethylene glycol packet  Commonly known as:  MIRALAX / GLYCOLAX  Take 17 g by mouth daily as needed for mild  constipation or moderate constipation.     potassium chloride SA 20 MEQ tablet  Commonly known as:  K-DUR,KLOR-CON  Take 2 tablets (40 mEq total) by mouth daily.     simvastatin 20 MG tablet  Commonly known as:  ZOCOR  Take 20 mg by mouth every evening.     triamcinolone 0.1 % paste  Commonly known as:  KENALOG  Use as directed 1 application in the mouth or throat 2 (two) times daily.     vitamin C 500 MG tablet  Commonly known as:  ASCORBIC ACID  Take 500 mg by mouth daily.       Follow-up Information    Follow up with [facility].   Why:  HHRN for PNA disease Management, PT, aide   Contact information:   [address] [place] [address] [phone]       Follow up with [facility].   Why:  rolling walker- should be brought up to patient's room   Contact information:   [address] [place] [address] [phone]       Follow up with Tivis Ringer, MD. Schedule an appointment as soon as possible for a visit in 3 days.   Specialty:  Internal Medicine   Why:  To be seen with repeat labs (CBC & BMP).   Contact information:   [address] [place] [address] [phone]        The results of significant diagnostics from this hospitalization (including imaging, microbiology, ancillary and laboratory) are listed below for reference.    Significant Diagnostic Studies: Dg Chest 2 View  08/10/2014   CLINICAL DATA:  Pneumonia.  EXAM: CHEST  2 VIEW  COMPARISON:  August 06, 2014.  FINDINGS: Stable cardiomegaly. No pneumothorax is noted. Mild right basilar opacity is noted concerning for pneumonia or atelectasis. Stable mild left basilar subsegmental atelectasis is noted. No significant pleural effusion is noted. Severe degenerative changes seen involving the right glenohumeral joint, as well as narrowing of right subacromial space suggesting rotator cuff  injury.  IMPRESSION: Stable right basilar opacity  concerning for pneumonia or atelectasis. Stable mild left basilar subsegmental atelectasis.   Electronically Signed   By: Marijo Conception, M.D.   On: 08/10/2014 08:49   Dg Chest 2 View  08/06/2014   CLINICAL DATA:  Fever, pneumonia  EXAM: CHEST  2 VIEW  COMPARISON:  11/17/2010  FINDINGS: Borderline cardiomegaly. There is infiltrate/ pneumonia in right middle lobe and right base. Streaky atelectasis or infiltrate left base. Surgical clips are noted in left axilla. Degenerative changes right shoulder.  IMPRESSION: Infiltrate/pneumonia in right middle lobe and right base. Streaky at atelectasis or infiltrate left base. Follow-up to resolution after appropriate treatment is recommended.   Electronically Signed   By: Lahoma Crocker M.D.   On: 08/06/2014 10:12    Microbiology: Recent Results (from the past 240 hour(s))  Culture, blood (routine x 2)     Status: None (Preliminary result)   Collection Time: 08/09/14  9:53 PM  Result Value Ref Range Status   Specimen Description BLOOD RIGHT ANTECUBITAL  Final   Special Requests BOTTLES DRAWN AEROBIC AND ANAEROBIC 10CC EA  Final   Culture NO GROWTH < 24 HOURS  Final   Report Status PENDING  Incomplete  Culture, blood (routine x 2)     Status: None (Preliminary result)   Collection Time: 08/09/14 10:02 PM  Result Value Ref Range Status   Specimen Description BLOOD RIGHT HAND  Final   Special Requests IN PEDIATRIC BOTTLE 2CC  Final   Culture NO GROWTH < 24 HOURS  Final   Report Status PENDING  Incomplete     Labs: Basic Metabolic Panel:  Recent Labs Lab 08/06/14 1600 08/07/14 0531 08/08/14 0510 08/09/14 0330 08/10/14 0441  NA 125* 125* 125* 123* 128*  K 4.2 4.7 5.0 4.7 4.3  CL 96* 95* 98* 94* 96*  CO2 20* 21* 21* 20* 23  GLUCOSE 74 90 92 95 90  BUN 7 6 9 10 6   CREATININE 0.64 0.72 0.66 0.64 0.60  CALCIUM 8.7* 8.5* 8.5* 8.3* 8.6*   Liver Function Tests:  Recent Labs Lab 08/06/14 0851  AST 19  ALT 13*  ALKPHOS 58  BILITOT 0.6  PROT  6.9  ALBUMIN 3.3*   No results for input(s): LIPASE, AMYLASE in the last 168 hours. No results for input(s): AMMONIA in the last 168 hours. CBC:  Recent Labs Lab 08/06/14 0851 08/07/14 0531  WBC 5.1 4.3  HGB 12.9 12.3  HCT 35.8* 36.0  MCV 81.2 84.3  PLT 202 185   Cardiac Enzymes: No results for input(s): CKTOTAL, CKMB, CKMBINDEX, TROPONINI in the last 168 hours. BNP: BNP (last 3 results) No results for input(s): BNP in the last 8760 hours.  ProBNP (last 3 results) No results for input(s): PROBNP in the last 8760 hours.  CBG: No results for input(s): GLUCAP in the last 168 hours.   Discussed at length with patient's spouse at bedside. Called and updated PCP.   Signed:  Vernell Leep, MD, FACP, FHM. [facility] Pager [phone]  If 7PM-7AM, please contact night-coverage [email or web address] Password [email or web address] 08/11/2014, 1:54 PM

## 2014-08-12 DIAGNOSIS — I10 Essential (primary) hypertension: Secondary | ICD-10-CM | POA: Diagnosis not present

## 2014-08-12 DIAGNOSIS — J189 Pneumonia, unspecified organism: Secondary | ICD-10-CM | POA: Diagnosis not present

## 2014-08-12 DIAGNOSIS — J45909 Unspecified asthma, uncomplicated: Secondary | ICD-10-CM | POA: Diagnosis not present

## 2014-08-12 DIAGNOSIS — F419 Anxiety disorder, unspecified: Secondary | ICD-10-CM | POA: Diagnosis not present

## 2014-08-12 DIAGNOSIS — Z87891 Personal history of nicotine dependence: Secondary | ICD-10-CM | POA: Diagnosis not present

## 2014-08-12 DIAGNOSIS — J440 Chronic obstructive pulmonary disease with acute lower respiratory infection: Secondary | ICD-10-CM | POA: Diagnosis not present

## 2014-08-13 DIAGNOSIS — J440 Chronic obstructive pulmonary disease with acute lower respiratory infection: Secondary | ICD-10-CM | POA: Diagnosis not present

## 2014-08-13 DIAGNOSIS — I10 Essential (primary) hypertension: Secondary | ICD-10-CM | POA: Diagnosis not present

## 2014-08-13 DIAGNOSIS — F419 Anxiety disorder, unspecified: Secondary | ICD-10-CM | POA: Diagnosis not present

## 2014-08-13 DIAGNOSIS — J189 Pneumonia, unspecified organism: Secondary | ICD-10-CM | POA: Diagnosis not present

## 2014-08-13 DIAGNOSIS — J45909 Unspecified asthma, uncomplicated: Secondary | ICD-10-CM | POA: Diagnosis not present

## 2014-08-13 DIAGNOSIS — Z87891 Personal history of nicotine dependence: Secondary | ICD-10-CM | POA: Diagnosis not present

## 2014-08-14 DIAGNOSIS — J189 Pneumonia, unspecified organism: Secondary | ICD-10-CM | POA: Diagnosis not present

## 2014-08-14 DIAGNOSIS — E871 Hypo-osmolality and hyponatremia: Secondary | ICD-10-CM | POA: Diagnosis not present

## 2014-08-14 LAB — CULTURE, BLOOD (ROUTINE X 2)
Culture: NO GROWTH
Culture: NO GROWTH

## 2014-08-15 DIAGNOSIS — J189 Pneumonia, unspecified organism: Secondary | ICD-10-CM | POA: Diagnosis not present

## 2014-08-15 DIAGNOSIS — F419 Anxiety disorder, unspecified: Secondary | ICD-10-CM | POA: Diagnosis not present

## 2014-08-15 DIAGNOSIS — I10 Essential (primary) hypertension: Secondary | ICD-10-CM | POA: Diagnosis not present

## 2014-08-15 DIAGNOSIS — Z87891 Personal history of nicotine dependence: Secondary | ICD-10-CM | POA: Diagnosis not present

## 2014-08-15 DIAGNOSIS — J440 Chronic obstructive pulmonary disease with acute lower respiratory infection: Secondary | ICD-10-CM | POA: Diagnosis not present

## 2014-08-15 DIAGNOSIS — J45909 Unspecified asthma, uncomplicated: Secondary | ICD-10-CM | POA: Diagnosis not present

## 2014-08-17 DIAGNOSIS — J440 Chronic obstructive pulmonary disease with acute lower respiratory infection: Secondary | ICD-10-CM | POA: Diagnosis not present

## 2014-08-17 DIAGNOSIS — F419 Anxiety disorder, unspecified: Secondary | ICD-10-CM | POA: Diagnosis not present

## 2014-08-17 DIAGNOSIS — I10 Essential (primary) hypertension: Secondary | ICD-10-CM | POA: Diagnosis not present

## 2014-08-17 DIAGNOSIS — J189 Pneumonia, unspecified organism: Secondary | ICD-10-CM | POA: Diagnosis not present

## 2014-08-17 DIAGNOSIS — Z87891 Personal history of nicotine dependence: Secondary | ICD-10-CM | POA: Diagnosis not present

## 2014-08-17 DIAGNOSIS — J45909 Unspecified asthma, uncomplicated: Secondary | ICD-10-CM | POA: Diagnosis not present

## 2014-08-18 DIAGNOSIS — Z87891 Personal history of nicotine dependence: Secondary | ICD-10-CM | POA: Diagnosis not present

## 2014-08-18 DIAGNOSIS — F419 Anxiety disorder, unspecified: Secondary | ICD-10-CM | POA: Diagnosis not present

## 2014-08-18 DIAGNOSIS — J440 Chronic obstructive pulmonary disease with acute lower respiratory infection: Secondary | ICD-10-CM | POA: Diagnosis not present

## 2014-08-18 DIAGNOSIS — J189 Pneumonia, unspecified organism: Secondary | ICD-10-CM | POA: Diagnosis not present

## 2014-08-18 DIAGNOSIS — J45909 Unspecified asthma, uncomplicated: Secondary | ICD-10-CM | POA: Diagnosis not present

## 2014-08-18 DIAGNOSIS — I10 Essential (primary) hypertension: Secondary | ICD-10-CM | POA: Diagnosis not present

## 2014-08-19 DIAGNOSIS — Z87891 Personal history of nicotine dependence: Secondary | ICD-10-CM | POA: Diagnosis not present

## 2014-08-19 DIAGNOSIS — F419 Anxiety disorder, unspecified: Secondary | ICD-10-CM | POA: Diagnosis not present

## 2014-08-19 DIAGNOSIS — J440 Chronic obstructive pulmonary disease with acute lower respiratory infection: Secondary | ICD-10-CM | POA: Diagnosis not present

## 2014-08-19 DIAGNOSIS — J189 Pneumonia, unspecified organism: Secondary | ICD-10-CM | POA: Diagnosis not present

## 2014-08-19 DIAGNOSIS — I10 Essential (primary) hypertension: Secondary | ICD-10-CM | POA: Diagnosis not present

## 2014-08-19 DIAGNOSIS — J45909 Unspecified asthma, uncomplicated: Secondary | ICD-10-CM | POA: Diagnosis not present

## 2014-08-20 DIAGNOSIS — J440 Chronic obstructive pulmonary disease with acute lower respiratory infection: Secondary | ICD-10-CM | POA: Diagnosis not present

## 2014-08-20 DIAGNOSIS — I10 Essential (primary) hypertension: Secondary | ICD-10-CM | POA: Diagnosis not present

## 2014-08-20 DIAGNOSIS — J45909 Unspecified asthma, uncomplicated: Secondary | ICD-10-CM | POA: Diagnosis not present

## 2014-08-20 DIAGNOSIS — F419 Anxiety disorder, unspecified: Secondary | ICD-10-CM | POA: Diagnosis not present

## 2014-08-20 DIAGNOSIS — Z87891 Personal history of nicotine dependence: Secondary | ICD-10-CM | POA: Diagnosis not present

## 2014-08-20 DIAGNOSIS — J189 Pneumonia, unspecified organism: Secondary | ICD-10-CM | POA: Diagnosis not present

## 2014-08-24 DIAGNOSIS — Z87891 Personal history of nicotine dependence: Secondary | ICD-10-CM | POA: Diagnosis not present

## 2014-08-24 DIAGNOSIS — F419 Anxiety disorder, unspecified: Secondary | ICD-10-CM | POA: Diagnosis not present

## 2014-08-24 DIAGNOSIS — J440 Chronic obstructive pulmonary disease with acute lower respiratory infection: Secondary | ICD-10-CM | POA: Diagnosis not present

## 2014-08-24 DIAGNOSIS — J45909 Unspecified asthma, uncomplicated: Secondary | ICD-10-CM | POA: Diagnosis not present

## 2014-08-24 DIAGNOSIS — I10 Essential (primary) hypertension: Secondary | ICD-10-CM | POA: Diagnosis not present

## 2014-08-24 DIAGNOSIS — J189 Pneumonia, unspecified organism: Secondary | ICD-10-CM | POA: Diagnosis not present

## 2014-08-25 DIAGNOSIS — J45909 Unspecified asthma, uncomplicated: Secondary | ICD-10-CM | POA: Diagnosis not present

## 2014-08-25 DIAGNOSIS — I10 Essential (primary) hypertension: Secondary | ICD-10-CM | POA: Diagnosis not present

## 2014-08-25 DIAGNOSIS — J189 Pneumonia, unspecified organism: Secondary | ICD-10-CM | POA: Diagnosis not present

## 2014-08-25 DIAGNOSIS — Z87891 Personal history of nicotine dependence: Secondary | ICD-10-CM | POA: Diagnosis not present

## 2014-08-25 DIAGNOSIS — J440 Chronic obstructive pulmonary disease with acute lower respiratory infection: Secondary | ICD-10-CM | POA: Diagnosis not present

## 2014-08-25 DIAGNOSIS — F419 Anxiety disorder, unspecified: Secondary | ICD-10-CM | POA: Diagnosis not present

## 2014-08-26 DIAGNOSIS — F419 Anxiety disorder, unspecified: Secondary | ICD-10-CM | POA: Diagnosis not present

## 2014-08-26 DIAGNOSIS — Z87891 Personal history of nicotine dependence: Secondary | ICD-10-CM | POA: Diagnosis not present

## 2014-08-26 DIAGNOSIS — J189 Pneumonia, unspecified organism: Secondary | ICD-10-CM | POA: Diagnosis not present

## 2014-08-26 DIAGNOSIS — J440 Chronic obstructive pulmonary disease with acute lower respiratory infection: Secondary | ICD-10-CM | POA: Diagnosis not present

## 2014-08-26 DIAGNOSIS — J45909 Unspecified asthma, uncomplicated: Secondary | ICD-10-CM | POA: Diagnosis not present

## 2014-08-26 DIAGNOSIS — I10 Essential (primary) hypertension: Secondary | ICD-10-CM | POA: Diagnosis not present

## 2014-08-27 DIAGNOSIS — J189 Pneumonia, unspecified organism: Secondary | ICD-10-CM | POA: Diagnosis not present

## 2014-08-27 DIAGNOSIS — J440 Chronic obstructive pulmonary disease with acute lower respiratory infection: Secondary | ICD-10-CM | POA: Diagnosis not present

## 2014-08-27 DIAGNOSIS — J45909 Unspecified asthma, uncomplicated: Secondary | ICD-10-CM | POA: Diagnosis not present

## 2014-08-27 DIAGNOSIS — I10 Essential (primary) hypertension: Secondary | ICD-10-CM | POA: Diagnosis not present

## 2014-08-27 DIAGNOSIS — Z87891 Personal history of nicotine dependence: Secondary | ICD-10-CM | POA: Diagnosis not present

## 2014-08-27 DIAGNOSIS — F419 Anxiety disorder, unspecified: Secondary | ICD-10-CM | POA: Diagnosis not present

## 2014-08-28 DIAGNOSIS — J189 Pneumonia, unspecified organism: Secondary | ICD-10-CM | POA: Diagnosis not present

## 2014-08-28 DIAGNOSIS — F419 Anxiety disorder, unspecified: Secondary | ICD-10-CM | POA: Diagnosis not present

## 2014-08-28 DIAGNOSIS — J440 Chronic obstructive pulmonary disease with acute lower respiratory infection: Secondary | ICD-10-CM | POA: Diagnosis not present

## 2014-08-28 DIAGNOSIS — I10 Essential (primary) hypertension: Secondary | ICD-10-CM | POA: Diagnosis not present

## 2014-08-28 DIAGNOSIS — J45909 Unspecified asthma, uncomplicated: Secondary | ICD-10-CM | POA: Diagnosis not present

## 2014-08-28 DIAGNOSIS — Z87891 Personal history of nicotine dependence: Secondary | ICD-10-CM | POA: Diagnosis not present

## 2014-08-31 DIAGNOSIS — I10 Essential (primary) hypertension: Secondary | ICD-10-CM | POA: Diagnosis not present

## 2014-08-31 DIAGNOSIS — J440 Chronic obstructive pulmonary disease with acute lower respiratory infection: Secondary | ICD-10-CM | POA: Diagnosis not present

## 2014-08-31 DIAGNOSIS — J189 Pneumonia, unspecified organism: Secondary | ICD-10-CM | POA: Diagnosis not present

## 2014-08-31 DIAGNOSIS — J45909 Unspecified asthma, uncomplicated: Secondary | ICD-10-CM | POA: Diagnosis not present

## 2014-08-31 DIAGNOSIS — F419 Anxiety disorder, unspecified: Secondary | ICD-10-CM | POA: Diagnosis not present

## 2014-08-31 DIAGNOSIS — Z87891 Personal history of nicotine dependence: Secondary | ICD-10-CM | POA: Diagnosis not present

## 2014-09-02 DIAGNOSIS — J189 Pneumonia, unspecified organism: Secondary | ICD-10-CM | POA: Diagnosis not present

## 2014-09-02 DIAGNOSIS — I10 Essential (primary) hypertension: Secondary | ICD-10-CM | POA: Diagnosis not present

## 2014-09-02 DIAGNOSIS — Z87891 Personal history of nicotine dependence: Secondary | ICD-10-CM | POA: Diagnosis not present

## 2014-09-02 DIAGNOSIS — J440 Chronic obstructive pulmonary disease with acute lower respiratory infection: Secondary | ICD-10-CM | POA: Diagnosis not present

## 2014-09-02 DIAGNOSIS — F419 Anxiety disorder, unspecified: Secondary | ICD-10-CM | POA: Diagnosis not present

## 2014-09-02 DIAGNOSIS — J45909 Unspecified asthma, uncomplicated: Secondary | ICD-10-CM | POA: Diagnosis not present

## 2014-09-03 DIAGNOSIS — Z87891 Personal history of nicotine dependence: Secondary | ICD-10-CM | POA: Diagnosis not present

## 2014-09-03 DIAGNOSIS — J440 Chronic obstructive pulmonary disease with acute lower respiratory infection: Secondary | ICD-10-CM | POA: Diagnosis not present

## 2014-09-03 DIAGNOSIS — F419 Anxiety disorder, unspecified: Secondary | ICD-10-CM | POA: Diagnosis not present

## 2014-09-03 DIAGNOSIS — J189 Pneumonia, unspecified organism: Secondary | ICD-10-CM | POA: Diagnosis not present

## 2014-09-03 DIAGNOSIS — J45909 Unspecified asthma, uncomplicated: Secondary | ICD-10-CM | POA: Diagnosis not present

## 2014-09-03 DIAGNOSIS — I10 Essential (primary) hypertension: Secondary | ICD-10-CM | POA: Diagnosis not present

## 2014-09-04 DIAGNOSIS — E876 Hypokalemia: Secondary | ICD-10-CM | POA: Diagnosis not present

## 2014-09-07 DIAGNOSIS — J45909 Unspecified asthma, uncomplicated: Secondary | ICD-10-CM | POA: Diagnosis not present

## 2014-09-07 DIAGNOSIS — F419 Anxiety disorder, unspecified: Secondary | ICD-10-CM | POA: Diagnosis not present

## 2014-09-07 DIAGNOSIS — J189 Pneumonia, unspecified organism: Secondary | ICD-10-CM | POA: Diagnosis not present

## 2014-09-07 DIAGNOSIS — I10 Essential (primary) hypertension: Secondary | ICD-10-CM | POA: Diagnosis not present

## 2014-09-07 DIAGNOSIS — J440 Chronic obstructive pulmonary disease with acute lower respiratory infection: Secondary | ICD-10-CM | POA: Diagnosis not present

## 2014-09-07 DIAGNOSIS — Z87891 Personal history of nicotine dependence: Secondary | ICD-10-CM | POA: Diagnosis not present

## 2014-09-08 DIAGNOSIS — F419 Anxiety disorder, unspecified: Secondary | ICD-10-CM | POA: Diagnosis not present

## 2014-09-08 DIAGNOSIS — Z87891 Personal history of nicotine dependence: Secondary | ICD-10-CM | POA: Diagnosis not present

## 2014-09-08 DIAGNOSIS — J440 Chronic obstructive pulmonary disease with acute lower respiratory infection: Secondary | ICD-10-CM | POA: Diagnosis not present

## 2014-09-08 DIAGNOSIS — J189 Pneumonia, unspecified organism: Secondary | ICD-10-CM | POA: Diagnosis not present

## 2014-09-08 DIAGNOSIS — J45909 Unspecified asthma, uncomplicated: Secondary | ICD-10-CM | POA: Diagnosis not present

## 2014-09-08 DIAGNOSIS — I10 Essential (primary) hypertension: Secondary | ICD-10-CM | POA: Diagnosis not present

## 2014-09-09 DIAGNOSIS — J440 Chronic obstructive pulmonary disease with acute lower respiratory infection: Secondary | ICD-10-CM | POA: Diagnosis not present

## 2014-09-09 DIAGNOSIS — J45909 Unspecified asthma, uncomplicated: Secondary | ICD-10-CM | POA: Diagnosis not present

## 2014-09-09 DIAGNOSIS — E871 Hypo-osmolality and hyponatremia: Secondary | ICD-10-CM | POA: Diagnosis not present

## 2014-09-09 DIAGNOSIS — Z6837 Body mass index (BMI) 37.0-37.9, adult: Secondary | ICD-10-CM | POA: Diagnosis not present

## 2014-09-09 DIAGNOSIS — I10 Essential (primary) hypertension: Secondary | ICD-10-CM | POA: Diagnosis not present

## 2014-09-09 DIAGNOSIS — R600 Localized edema: Secondary | ICD-10-CM | POA: Diagnosis not present

## 2014-09-09 DIAGNOSIS — Z87891 Personal history of nicotine dependence: Secondary | ICD-10-CM | POA: Diagnosis not present

## 2014-09-09 DIAGNOSIS — F419 Anxiety disorder, unspecified: Secondary | ICD-10-CM | POA: Diagnosis not present

## 2014-09-09 DIAGNOSIS — B3781 Candidal esophagitis: Secondary | ICD-10-CM | POA: Diagnosis not present

## 2014-09-09 DIAGNOSIS — J189 Pneumonia, unspecified organism: Secondary | ICD-10-CM | POA: Diagnosis not present

## 2014-09-10 DIAGNOSIS — J45909 Unspecified asthma, uncomplicated: Secondary | ICD-10-CM | POA: Diagnosis not present

## 2014-09-10 DIAGNOSIS — Z87891 Personal history of nicotine dependence: Secondary | ICD-10-CM | POA: Diagnosis not present

## 2014-09-10 DIAGNOSIS — I10 Essential (primary) hypertension: Secondary | ICD-10-CM | POA: Diagnosis not present

## 2014-09-10 DIAGNOSIS — J440 Chronic obstructive pulmonary disease with acute lower respiratory infection: Secondary | ICD-10-CM | POA: Diagnosis not present

## 2014-09-10 DIAGNOSIS — J189 Pneumonia, unspecified organism: Secondary | ICD-10-CM | POA: Diagnosis not present

## 2014-09-10 DIAGNOSIS — F419 Anxiety disorder, unspecified: Secondary | ICD-10-CM | POA: Diagnosis not present

## 2014-09-14 ENCOUNTER — Other Ambulatory Visit: Payer: Self-pay

## 2014-09-14 ENCOUNTER — Other Ambulatory Visit (HOSPITAL_COMMUNITY): Payer: Self-pay | Admitting: Internal Medicine

## 2014-09-14 DIAGNOSIS — I10 Essential (primary) hypertension: Secondary | ICD-10-CM | POA: Diagnosis not present

## 2014-09-14 DIAGNOSIS — F419 Anxiety disorder, unspecified: Secondary | ICD-10-CM | POA: Diagnosis not present

## 2014-09-14 DIAGNOSIS — J440 Chronic obstructive pulmonary disease with acute lower respiratory infection: Secondary | ICD-10-CM | POA: Diagnosis not present

## 2014-09-14 DIAGNOSIS — Z1231 Encounter for screening mammogram for malignant neoplasm of breast: Secondary | ICD-10-CM

## 2014-09-14 DIAGNOSIS — Z87891 Personal history of nicotine dependence: Secondary | ICD-10-CM | POA: Diagnosis not present

## 2014-09-14 DIAGNOSIS — J189 Pneumonia, unspecified organism: Secondary | ICD-10-CM | POA: Diagnosis not present

## 2014-09-14 DIAGNOSIS — I318 Other specified diseases of pericardium: Secondary | ICD-10-CM

## 2014-09-14 DIAGNOSIS — J45909 Unspecified asthma, uncomplicated: Secondary | ICD-10-CM | POA: Diagnosis not present

## 2014-09-15 DIAGNOSIS — I10 Essential (primary) hypertension: Secondary | ICD-10-CM | POA: Diagnosis not present

## 2014-09-15 DIAGNOSIS — J440 Chronic obstructive pulmonary disease with acute lower respiratory infection: Secondary | ICD-10-CM | POA: Diagnosis not present

## 2014-09-15 DIAGNOSIS — Z87891 Personal history of nicotine dependence: Secondary | ICD-10-CM | POA: Diagnosis not present

## 2014-09-15 DIAGNOSIS — F419 Anxiety disorder, unspecified: Secondary | ICD-10-CM | POA: Diagnosis not present

## 2014-09-15 DIAGNOSIS — J45909 Unspecified asthma, uncomplicated: Secondary | ICD-10-CM | POA: Diagnosis not present

## 2014-09-15 DIAGNOSIS — J189 Pneumonia, unspecified organism: Secondary | ICD-10-CM | POA: Diagnosis not present

## 2014-09-15 DIAGNOSIS — Z6836 Body mass index (BMI) 36.0-36.9, adult: Secondary | ICD-10-CM | POA: Diagnosis not present

## 2014-09-15 DIAGNOSIS — E871 Hypo-osmolality and hyponatremia: Secondary | ICD-10-CM | POA: Diagnosis not present

## 2014-09-15 DIAGNOSIS — I318 Other specified diseases of pericardium: Secondary | ICD-10-CM | POA: Diagnosis not present

## 2014-09-16 DIAGNOSIS — D225 Melanocytic nevi of trunk: Secondary | ICD-10-CM | POA: Diagnosis not present

## 2014-09-16 DIAGNOSIS — L821 Other seborrheic keratosis: Secondary | ICD-10-CM | POA: Diagnosis not present

## 2014-09-16 DIAGNOSIS — D2371 Other benign neoplasm of skin of right lower limb, including hip: Secondary | ICD-10-CM | POA: Diagnosis not present

## 2014-09-16 DIAGNOSIS — L304 Erythema intertrigo: Secondary | ICD-10-CM | POA: Diagnosis not present

## 2014-09-17 DIAGNOSIS — Z87891 Personal history of nicotine dependence: Secondary | ICD-10-CM | POA: Diagnosis not present

## 2014-09-17 DIAGNOSIS — F419 Anxiety disorder, unspecified: Secondary | ICD-10-CM | POA: Diagnosis not present

## 2014-09-17 DIAGNOSIS — J45909 Unspecified asthma, uncomplicated: Secondary | ICD-10-CM | POA: Diagnosis not present

## 2014-09-17 DIAGNOSIS — J440 Chronic obstructive pulmonary disease with acute lower respiratory infection: Secondary | ICD-10-CM | POA: Diagnosis not present

## 2014-09-17 DIAGNOSIS — I10 Essential (primary) hypertension: Secondary | ICD-10-CM | POA: Diagnosis not present

## 2014-09-17 DIAGNOSIS — J189 Pneumonia, unspecified organism: Secondary | ICD-10-CM | POA: Diagnosis not present

## 2014-09-18 DIAGNOSIS — J189 Pneumonia, unspecified organism: Secondary | ICD-10-CM | POA: Diagnosis not present

## 2014-09-18 DIAGNOSIS — F419 Anxiety disorder, unspecified: Secondary | ICD-10-CM | POA: Diagnosis not present

## 2014-09-18 DIAGNOSIS — J440 Chronic obstructive pulmonary disease with acute lower respiratory infection: Secondary | ICD-10-CM | POA: Diagnosis not present

## 2014-09-18 DIAGNOSIS — J45909 Unspecified asthma, uncomplicated: Secondary | ICD-10-CM | POA: Diagnosis not present

## 2014-09-18 DIAGNOSIS — Z87891 Personal history of nicotine dependence: Secondary | ICD-10-CM | POA: Diagnosis not present

## 2014-09-18 DIAGNOSIS — I10 Essential (primary) hypertension: Secondary | ICD-10-CM | POA: Diagnosis not present

## 2014-09-21 DIAGNOSIS — F419 Anxiety disorder, unspecified: Secondary | ICD-10-CM | POA: Diagnosis not present

## 2014-09-21 DIAGNOSIS — J440 Chronic obstructive pulmonary disease with acute lower respiratory infection: Secondary | ICD-10-CM | POA: Diagnosis not present

## 2014-09-21 DIAGNOSIS — I10 Essential (primary) hypertension: Secondary | ICD-10-CM | POA: Diagnosis not present

## 2014-09-21 DIAGNOSIS — Z87891 Personal history of nicotine dependence: Secondary | ICD-10-CM | POA: Diagnosis not present

## 2014-09-21 DIAGNOSIS — J189 Pneumonia, unspecified organism: Secondary | ICD-10-CM | POA: Diagnosis not present

## 2014-09-21 DIAGNOSIS — J45909 Unspecified asthma, uncomplicated: Secondary | ICD-10-CM | POA: Diagnosis not present

## 2014-09-21 DIAGNOSIS — H15101 Unspecified episcleritis, right eye: Secondary | ICD-10-CM | POA: Diagnosis not present

## 2014-09-22 ENCOUNTER — Other Ambulatory Visit: Payer: Self-pay

## 2014-09-22 ENCOUNTER — Ambulatory Visit (HOSPITAL_COMMUNITY): Payer: Medicare Other | Attending: Cardiology

## 2014-09-22 DIAGNOSIS — I517 Cardiomegaly: Secondary | ICD-10-CM | POA: Insufficient documentation

## 2014-09-22 DIAGNOSIS — I071 Rheumatic tricuspid insufficiency: Secondary | ICD-10-CM | POA: Insufficient documentation

## 2014-09-22 DIAGNOSIS — E669 Obesity, unspecified: Secondary | ICD-10-CM | POA: Diagnosis not present

## 2014-09-22 DIAGNOSIS — Z6837 Body mass index (BMI) 37.0-37.9, adult: Secondary | ICD-10-CM | POA: Insufficient documentation

## 2014-09-22 DIAGNOSIS — I318 Other specified diseases of pericardium: Secondary | ICD-10-CM | POA: Diagnosis not present

## 2014-09-22 DIAGNOSIS — I313 Pericardial effusion (noninflammatory): Secondary | ICD-10-CM | POA: Diagnosis not present

## 2014-09-30 DIAGNOSIS — J45909 Unspecified asthma, uncomplicated: Secondary | ICD-10-CM | POA: Diagnosis not present

## 2014-09-30 DIAGNOSIS — J440 Chronic obstructive pulmonary disease with acute lower respiratory infection: Secondary | ICD-10-CM | POA: Diagnosis not present

## 2014-09-30 DIAGNOSIS — F419 Anxiety disorder, unspecified: Secondary | ICD-10-CM | POA: Diagnosis not present

## 2014-09-30 DIAGNOSIS — Z87891 Personal history of nicotine dependence: Secondary | ICD-10-CM | POA: Diagnosis not present

## 2014-09-30 DIAGNOSIS — J189 Pneumonia, unspecified organism: Secondary | ICD-10-CM | POA: Diagnosis not present

## 2014-09-30 DIAGNOSIS — I10 Essential (primary) hypertension: Secondary | ICD-10-CM | POA: Diagnosis not present

## 2014-10-01 DIAGNOSIS — F419 Anxiety disorder, unspecified: Secondary | ICD-10-CM | POA: Diagnosis not present

## 2014-10-01 DIAGNOSIS — J189 Pneumonia, unspecified organism: Secondary | ICD-10-CM | POA: Diagnosis not present

## 2014-10-01 DIAGNOSIS — J45909 Unspecified asthma, uncomplicated: Secondary | ICD-10-CM | POA: Diagnosis not present

## 2014-10-01 DIAGNOSIS — J440 Chronic obstructive pulmonary disease with acute lower respiratory infection: Secondary | ICD-10-CM | POA: Diagnosis not present

## 2014-10-01 DIAGNOSIS — I10 Essential (primary) hypertension: Secondary | ICD-10-CM | POA: Diagnosis not present

## 2014-10-01 DIAGNOSIS — Z87891 Personal history of nicotine dependence: Secondary | ICD-10-CM | POA: Diagnosis not present

## 2014-10-06 DIAGNOSIS — H1013 Acute atopic conjunctivitis, bilateral: Secondary | ICD-10-CM | POA: Diagnosis not present

## 2014-10-06 DIAGNOSIS — H15101 Unspecified episcleritis, right eye: Secondary | ICD-10-CM | POA: Diagnosis not present

## 2014-10-07 DIAGNOSIS — F419 Anxiety disorder, unspecified: Secondary | ICD-10-CM | POA: Diagnosis not present

## 2014-10-07 DIAGNOSIS — Z87891 Personal history of nicotine dependence: Secondary | ICD-10-CM | POA: Diagnosis not present

## 2014-10-07 DIAGNOSIS — J189 Pneumonia, unspecified organism: Secondary | ICD-10-CM | POA: Diagnosis not present

## 2014-10-07 DIAGNOSIS — J440 Chronic obstructive pulmonary disease with acute lower respiratory infection: Secondary | ICD-10-CM | POA: Diagnosis not present

## 2014-10-07 DIAGNOSIS — J45909 Unspecified asthma, uncomplicated: Secondary | ICD-10-CM | POA: Diagnosis not present

## 2014-10-07 DIAGNOSIS — I10 Essential (primary) hypertension: Secondary | ICD-10-CM | POA: Diagnosis not present

## 2014-10-08 DIAGNOSIS — H15011 Anterior scleritis, right eye: Secondary | ICD-10-CM | POA: Diagnosis not present

## 2014-10-08 DIAGNOSIS — H15101 Unspecified episcleritis, right eye: Secondary | ICD-10-CM | POA: Diagnosis not present

## 2014-10-10 DIAGNOSIS — Z23 Encounter for immunization: Secondary | ICD-10-CM | POA: Diagnosis not present

## 2014-10-12 DIAGNOSIS — H15011 Anterior scleritis, right eye: Secondary | ICD-10-CM | POA: Diagnosis not present

## 2014-10-12 DIAGNOSIS — H15101 Unspecified episcleritis, right eye: Secondary | ICD-10-CM | POA: Diagnosis not present

## 2014-10-22 ENCOUNTER — Ambulatory Visit
Admission: RE | Admit: 2014-10-22 | Discharge: 2014-10-22 | Disposition: A | Discharge status: Home / Self Care | Payer: Medicare Other | Source: Ambulatory Visit

## 2014-10-22 DIAGNOSIS — Z1231 Encounter for screening mammogram for malignant neoplasm of breast: Secondary | ICD-10-CM | POA: Diagnosis not present

## 2014-10-26 DIAGNOSIS — H15011 Anterior scleritis, right eye: Secondary | ICD-10-CM | POA: Diagnosis not present

## 2014-10-26 DIAGNOSIS — H40023 Open angle with borderline findings, high risk, bilateral: Secondary | ICD-10-CM | POA: Diagnosis not present

## 2014-10-26 DIAGNOSIS — H15101 Unspecified episcleritis, right eye: Secondary | ICD-10-CM | POA: Diagnosis not present

## 2014-10-30 DIAGNOSIS — I318 Other specified diseases of pericardium: Secondary | ICD-10-CM | POA: Diagnosis not present

## 2014-10-30 DIAGNOSIS — I519 Heart disease, unspecified: Secondary | ICD-10-CM | POA: Diagnosis not present

## 2014-10-30 DIAGNOSIS — I10 Essential (primary) hypertension: Secondary | ICD-10-CM | POA: Diagnosis not present

## 2014-10-30 DIAGNOSIS — R252 Cramp and spasm: Secondary | ICD-10-CM | POA: Diagnosis not present

## 2014-10-30 DIAGNOSIS — E871 Hypo-osmolality and hyponatremia: Secondary | ICD-10-CM | POA: Diagnosis not present

## 2014-10-30 DIAGNOSIS — F419 Anxiety disorder, unspecified: Secondary | ICD-10-CM | POA: Diagnosis not present

## 2014-10-30 DIAGNOSIS — Z6836 Body mass index (BMI) 36.0-36.9, adult: Secondary | ICD-10-CM | POA: Diagnosis not present

## 2014-11-09 DIAGNOSIS — H15101 Unspecified episcleritis, right eye: Secondary | ICD-10-CM | POA: Diagnosis not present

## 2014-11-09 DIAGNOSIS — H40023 Open angle with borderline findings, high risk, bilateral: Secondary | ICD-10-CM | POA: Diagnosis not present

## 2014-11-09 DIAGNOSIS — H15011 Anterior scleritis, right eye: Secondary | ICD-10-CM | POA: Diagnosis not present

## 2014-11-09 DIAGNOSIS — H04123 Dry eye syndrome of bilateral lacrimal glands: Secondary | ICD-10-CM | POA: Diagnosis not present

## 2014-12-07 DIAGNOSIS — R252 Cramp and spasm: Secondary | ICD-10-CM | POA: Diagnosis not present

## 2014-12-07 DIAGNOSIS — Z6837 Body mass index (BMI) 37.0-37.9, adult: Secondary | ICD-10-CM | POA: Diagnosis not present

## 2014-12-07 DIAGNOSIS — M545 Low back pain: Secondary | ICD-10-CM | POA: Diagnosis not present

## 2014-12-07 DIAGNOSIS — R1084 Generalized abdominal pain: Secondary | ICD-10-CM | POA: Diagnosis not present

## 2014-12-15 DIAGNOSIS — H40023 Open angle with borderline findings, high risk, bilateral: Secondary | ICD-10-CM | POA: Diagnosis not present

## 2014-12-24 ENCOUNTER — Encounter: Payer: Self-pay | Admitting: Cardiovascular Disease

## 2014-12-24 DIAGNOSIS — E871 Hypo-osmolality and hyponatremia: Secondary | ICD-10-CM | POA: Diagnosis not present

## 2014-12-24 DIAGNOSIS — I10 Essential (primary) hypertension: Secondary | ICD-10-CM | POA: Diagnosis not present

## 2014-12-24 DIAGNOSIS — E782 Mixed hyperlipidemia: Secondary | ICD-10-CM | POA: Diagnosis not present

## 2014-12-30 DIAGNOSIS — R946 Abnormal results of thyroid function studies: Secondary | ICD-10-CM | POA: Diagnosis not present

## 2015-01-01 DIAGNOSIS — Z0000 Encounter for general adult medical examination without abnormal findings: Secondary | ICD-10-CM | POA: Diagnosis not present

## 2015-01-01 DIAGNOSIS — J45909 Unspecified asthma, uncomplicated: Secondary | ICD-10-CM | POA: Diagnosis not present

## 2015-01-01 DIAGNOSIS — E782 Mixed hyperlipidemia: Secondary | ICD-10-CM | POA: Diagnosis not present

## 2015-01-01 DIAGNOSIS — Z1389 Encounter for screening for other disorder: Secondary | ICD-10-CM | POA: Diagnosis not present

## 2015-01-01 DIAGNOSIS — M1990 Unspecified osteoarthritis, unspecified site: Secondary | ICD-10-CM | POA: Diagnosis not present

## 2015-01-01 DIAGNOSIS — Q078 Other specified congenital malformations of nervous system: Secondary | ICD-10-CM | POA: Diagnosis not present

## 2015-01-01 DIAGNOSIS — I10 Essential (primary) hypertension: Secondary | ICD-10-CM | POA: Diagnosis not present

## 2015-01-01 DIAGNOSIS — R600 Localized edema: Secondary | ICD-10-CM | POA: Diagnosis not present

## 2015-01-01 DIAGNOSIS — K5730 Diverticulosis of large intestine without perforation or abscess without bleeding: Secondary | ICD-10-CM | POA: Diagnosis not present

## 2015-01-01 DIAGNOSIS — Z6836 Body mass index (BMI) 36.0-36.9, adult: Secondary | ICD-10-CM | POA: Diagnosis not present

## 2015-01-01 DIAGNOSIS — R946 Abnormal results of thyroid function studies: Secondary | ICD-10-CM | POA: Diagnosis not present

## 2015-01-01 DIAGNOSIS — K219 Gastro-esophageal reflux disease without esophagitis: Secondary | ICD-10-CM | POA: Diagnosis not present

## 2015-01-04 DIAGNOSIS — Z1212 Encounter for screening for malignant neoplasm of rectum: Secondary | ICD-10-CM | POA: Diagnosis not present

## 2015-01-24 HISTORY — PX: OTHER SURGICAL HISTORY: SHX169

## 2015-01-27 DIAGNOSIS — Z853 Personal history of malignant neoplasm of breast: Secondary | ICD-10-CM | POA: Diagnosis not present

## 2015-01-27 DIAGNOSIS — Z6839 Body mass index (BMI) 39.0-39.9, adult: Secondary | ICD-10-CM | POA: Diagnosis not present

## 2015-01-27 DIAGNOSIS — Z01419 Encounter for gynecological examination (general) (routine) without abnormal findings: Secondary | ICD-10-CM | POA: Diagnosis not present

## 2015-01-27 DIAGNOSIS — D229 Melanocytic nevi, unspecified: Secondary | ICD-10-CM | POA: Diagnosis not present

## 2015-01-27 DIAGNOSIS — D071 Carcinoma in situ of vulva: Secondary | ICD-10-CM | POA: Diagnosis not present

## 2015-01-29 DIAGNOSIS — H35033 Hypertensive retinopathy, bilateral: Secondary | ICD-10-CM | POA: Diagnosis not present

## 2015-01-29 DIAGNOSIS — H3562 Retinal hemorrhage, left eye: Secondary | ICD-10-CM | POA: Diagnosis not present

## 2015-01-29 DIAGNOSIS — H34831 Tributary (branch) retinal vein occlusion, right eye, with macular edema: Secondary | ICD-10-CM | POA: Diagnosis not present

## 2015-02-03 ENCOUNTER — Encounter (INDEPENDENT_AMBULATORY_CARE_PROVIDER_SITE_OTHER): Payer: Medicare Other | Admitting: Ophthalmology

## 2015-02-03 DIAGNOSIS — H43813 Vitreous degeneration, bilateral: Secondary | ICD-10-CM | POA: Diagnosis not present

## 2015-02-03 DIAGNOSIS — D071 Carcinoma in situ of vulva: Secondary | ICD-10-CM | POA: Diagnosis not present

## 2015-02-03 DIAGNOSIS — H35033 Hypertensive retinopathy, bilateral: Secondary | ICD-10-CM | POA: Diagnosis not present

## 2015-02-03 DIAGNOSIS — H3509 Other intraretinal microvascular abnormalities: Secondary | ICD-10-CM | POA: Diagnosis not present

## 2015-02-03 DIAGNOSIS — H34212 Partial retinal artery occlusion, left eye: Secondary | ICD-10-CM

## 2015-02-03 DIAGNOSIS — D229 Melanocytic nevi, unspecified: Secondary | ICD-10-CM | POA: Diagnosis not present

## 2015-02-03 DIAGNOSIS — B977 Papillomavirus as the cause of diseases classified elsewhere: Secondary | ICD-10-CM | POA: Diagnosis not present

## 2015-02-03 DIAGNOSIS — I10 Essential (primary) hypertension: Secondary | ICD-10-CM | POA: Diagnosis not present

## 2015-02-05 ENCOUNTER — Ambulatory Visit (HOSPITAL_COMMUNITY)
Admission: RE | Admit: 2015-02-05 | Discharge: 2015-02-05 | Disposition: A | Discharge status: Home / Self Care | Payer: Medicare Other | Source: Ambulatory Visit | Attending: Vascular Surgery | Admitting: Vascular Surgery

## 2015-02-05 ENCOUNTER — Other Ambulatory Visit (HOSPITAL_COMMUNITY): Payer: Self-pay | Admitting: Internal Medicine

## 2015-02-05 DIAGNOSIS — H34212 Partial retinal artery occlusion, left eye: Secondary | ICD-10-CM | POA: Diagnosis not present

## 2015-02-05 DIAGNOSIS — H35042 Retinal micro-aneurysms, unspecified, left eye: Secondary | ICD-10-CM

## 2015-02-05 NOTE — Progress Notes (Signed)
Preliminary report phoned and given to Amy [email or web address]

## 2015-02-16 ENCOUNTER — Encounter: Payer: Self-pay | Admitting: Cardiovascular Disease

## 2015-02-16 ENCOUNTER — Ambulatory Visit: Payer: Medicare Other | Admitting: Cardiovascular Disease

## 2015-02-16 ENCOUNTER — Ambulatory Visit (INDEPENDENT_AMBULATORY_CARE_PROVIDER_SITE_OTHER): Payer: Medicare Other | Admitting: Cardiovascular Disease

## 2015-02-16 VITALS — BP 130/84 | HR 90 | Ht 69.0 in | Wt 241.8 lb

## 2015-02-16 DIAGNOSIS — H34212 Partial retinal artery occlusion, left eye: Secondary | ICD-10-CM

## 2015-02-16 DIAGNOSIS — I10 Essential (primary) hypertension: Secondary | ICD-10-CM

## 2015-02-16 NOTE — Patient Instructions (Signed)

## 2015-02-16 NOTE — Progress Notes (Signed)
New Consult Referring: Dr. Dagmar Hait  Chief Complaint  Patient presents with  . New Evaluation    retinal hemorrhage in left eye, heart murmur      History of Present Illness: 70 yo female with history of HTN, asthma, HLD, depression, GERD, OA who is here today as a new patient for cardiac evaluation. She has had recent vision problems and was told that she had a retinal embolus. Workup in [facility] with carotid artery dopplers 02/05/15 with mild bilateral carotid disease. Echo August 2016 with normal LV function and no valve disease. She was started on an ASA. She has had no further eye issues. She denies any palpitations, chest pain or SOB. She feels well overall.   Primary Care Physician: Tivis Ringer, MD   Past Medical History  Diagnosis Date  . Asthma   . Multiple allergies   . Chiari malformation   . Heart murmur   . Hypertension   . H/O varicella   . Yeast infection   . Ovarian cyst   . Breast cancer ([place]) 2007    Left  . 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 H/O osteopenia     08/2006  . Monilial vaginitis 07/2007  . 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 Abnormal Pap smear 2006    vaginal medicine according to patient  . Personal history of colonic adenomas 03/13/2012  . Diverticulosis   . Pneumonia 07/2014  . GERD (gastroesophageal reflux disease)     Past Surgical History  Procedure Laterality Date  . Hernia repair    . Abdominal surgery    . Knee surgery      right  . Abdominal hysterectomy    . Breast surgery      Lumpectomy, left breast  . Colonoscopy w/ biopsies      Current Outpatient Prescriptions  Medication Sig Dispense Refill  . acetaminophen (TYLENOL) 325 MG tablet Take 2 tablets (650 mg total) by mouth every 6 (six) hours as needed for mild pain, fever or headache (or Fever >/= 101).    Marland Kitchen albuterol (PROVENTIL) (2.5 MG/3ML) 0.083% nebulizer solution Take 2.5 mg by nebulization every 6 (six) hours as needed. For shortness of breath    .  amLODipine (NORVASC) 10 MG tablet Take 10 mg by mouth every evening.     . Calcium Carbonate-Vitamin D (CALTRATE 600+D PO) Take 1 tablet by mouth daily.    . celecoxib (CELEBREX) 200 MG capsule Take 200 mg by mouth daily as needed for moderate pain.     . cetirizine (ZYRTEC) 10 MG tablet Take 10 mg by mouth daily.    Marland Kitchen esomeprazole (NEXIUM) 40 MG capsule Take 40 mg by mouth daily before breakfast.    . fluticasone (FLONASE) 50 MCG/ACT nasal spray Place 1 spray into the nose at bedtime.     . Fluticasone-Salmeterol (ADVAIR) 250-50 MCG/DOSE AEPB Inhale 1 puff into the lungs every 12 (twelve) hours.    Marland Kitchen losartan (COZAAR) 50 MG tablet Take 50 mg by mouth daily.    Marland Kitchen menthol-cetylpyridinium (CEPACOL) 3 MG lozenge Take 1 lozenge (3 mg total) by mouth as needed for sore throat. 9 tablet 0  . Multiple Vitamin (MULTIVITAMIN) tablet Take 1 tablet by mouth daily.    Marland Kitchen nystatin (MYCOSTATIN) 100000 UNIT/ML suspension Take 5 mLs by mouth 4 (four) times daily.    . polyethylene glycol (MIRALAX / GLYCOLAX) packet Take 17 g by mouth daily as needed for mild constipation or moderate constipation.    Marland Kitchen  potassium chloride SA (K-DUR,KLOR-CON) 20 MEQ tablet Take 2 tablets (40 mEq total) by mouth daily. (Patient taking differently: Take 20 mEq by mouth 3 (three) times daily. )    . simvastatin (ZOCOR) 20 MG tablet Take 20 mg by mouth every evening.    . triamcinolone (KENALOG) 0.1 % paste Use as directed 1 application in the mouth or throat 2 (two) times daily.    . vitamin C (ASCORBIC ACID) 500 MG tablet Take 500 mg by mouth daily.     No current facility-administered medications for this visit.    Allergies  Allergen Reactions  . Ephedrine Shortness Of Breath    Social History   Social History  . Marital Status: Married    Spouse Name: N/A  . Number of Children: 1  . Years of Education: N/A   Occupational History  . Retired Education officer, museum    Social History Main Topics  . Smoking status: Former  Smoker    Quit date: 01/23/1978  . Smokeless tobacco: Never Used  . Alcohol Use: No  . Drug Use: No  . Sexual Activity: Yes    Birth Control/ Protection: Surgical   Other Topics Concern  . Not on file   Social History Narrative    Family History  Problem Relation Age of Onset  . Cancer Mother   . Heart failure Other   . Colon cancer Neg Hx   . Esophageal cancer Neg Hx   . Rectal cancer Neg Hx   . Stomach cancer Neg Hx     Review of Systems:  As stated in the HPI and otherwise negative.   BP 130/84 mmHg  Pulse 90  Ht 5\' 9"  (1.753 m)  Wt 241 lb 12.8 oz (109.68 kg)  BMI 35.69 kg/m2  SpO2 99%  Physical Examination: General: Well developed, well nourished, NAD HEENT: OP clear, mucus membranes moist SKIN: warm, dry. No rashes. Neuro: No focal deficits Musculoskeletal: Muscle strength 5/5 all ext Psychiatric: Mood and affect normal Neck: No JVD, no carotid bruits, no thyromegaly, no lymphadenopathy. Lungs:Clear bilaterally, no wheezes, rhonci, crackles Cardiovascular: Regular rate and rhythm. No murmurs, gallops or rubs. Abdomen:Soft. Bowel sounds present. Non-tender.  Extremities: No lower extremity edema. Pulses are 2 + in the bilateral DP/PT.  EKG:  EKG is ordered today. The ekg ordered today demonstrates NSR, rate 90 bpm.   Recent Labs: 08/06/2014: ALT 13* 08/07/2014: Hemoglobin 12.3; Platelets 185 08/10/2014: BUN 6; Creatinine, Ser 0.60; Potassium 4.3; Sodium 128*   Lipid Panel No results found for: CHOL, TRIG, HDL, CHOLHDL, VLDL, LDLCALC, LDLDIRECT   Wt Readings from Last 3 Encounters:  02/16/15 241 lb 12.8 oz (109.68 kg)  08/06/14 252 lb 6.8 oz (114.5 kg)  03/28/13 231 lb 6 oz (104.951 kg)     Other studies Reviewed: Additional studies/ records that were reviewed today include: office visits from primary care, EKG, echo. Review of the above records demonstrates:    Assessment and Plan:   1. Cardiac examination following retinal embolic event: She has  no palpitations to suggest an arrhythmia. She is in sinus today. Echo with normal LV function and no valve disease. She has no anginal type symptoms. Carotid dopplers essentially normal. She is now on an ASA. I do not recommend any further cardiac workup at this time. Continue ASA.   2. HTN: BP well controlled.   3. Hyperlidpemia: Lipids well controlled per pt.   Current medicines are reviewed at length with the patient today.  The patient does not  have concerns regarding medicines.  The following changes have been made:  no change  Labs/ tests ordered today include:   Orders Placed This Encounter  Procedures  . EKG 12-Lead     Disposition:   FU with me in 6 months   Signed, Lauree Chandler, MD 02/16/2015 11:13 AM    [facility] [address], [place], [place]  [address] Phone: [phone]; Fax: [phone]

## 2015-03-05 DIAGNOSIS — R252 Cramp and spasm: Secondary | ICD-10-CM | POA: Diagnosis not present

## 2015-03-10 DIAGNOSIS — Z6841 Body Mass Index (BMI) 40.0 and over, adult: Secondary | ICD-10-CM | POA: Diagnosis not present

## 2015-03-10 DIAGNOSIS — G935 Compression of brain: Secondary | ICD-10-CM | POA: Diagnosis not present

## 2015-03-11 ENCOUNTER — Encounter (INDEPENDENT_AMBULATORY_CARE_PROVIDER_SITE_OTHER): Payer: Medicare Other | Admitting: Ophthalmology

## 2015-03-11 DIAGNOSIS — H3509 Other intraretinal microvascular abnormalities: Secondary | ICD-10-CM | POA: Diagnosis not present

## 2015-03-16 ENCOUNTER — Other Ambulatory Visit: Payer: Self-pay | Admitting: Neurosurgery

## 2015-03-16 DIAGNOSIS — G935 Compression of brain: Secondary | ICD-10-CM

## 2015-03-25 ENCOUNTER — Ambulatory Visit
Admission: RE | Admit: 2015-03-25 | Discharge: 2015-03-25 | Disposition: A | Discharge status: Home / Self Care | Payer: Medicare Other | Source: Ambulatory Visit | Attending: Neurosurgery | Admitting: Neurosurgery

## 2015-03-25 ENCOUNTER — Other Ambulatory Visit: Payer: Medicare Other

## 2015-03-25 DIAGNOSIS — C50919 Malignant neoplasm of unspecified site of unspecified female breast: Secondary | ICD-10-CM | POA: Diagnosis not present

## 2015-03-25 DIAGNOSIS — G935 Compression of brain: Secondary | ICD-10-CM

## 2015-03-25 MED ORDER — GADOBENATE DIMEGLUMINE 529 MG/ML IV SOLN
20.0000 mL | Freq: Once | INTRAVENOUS | Status: AC | PRN
  Administered 2015-03-25: 20 mL via INTRAVENOUS

## 2015-04-09 DIAGNOSIS — E782 Mixed hyperlipidemia: Secondary | ICD-10-CM | POA: Diagnosis not present

## 2015-04-09 DIAGNOSIS — H35042 Retinal micro-aneurysms, unspecified, left eye: Secondary | ICD-10-CM | POA: Diagnosis not present

## 2015-04-09 DIAGNOSIS — R946 Abnormal results of thyroid function studies: Secondary | ICD-10-CM | POA: Diagnosis not present

## 2015-04-09 DIAGNOSIS — Z1389 Encounter for screening for other disorder: Secondary | ICD-10-CM | POA: Diagnosis not present

## 2015-04-09 DIAGNOSIS — Q078 Other specified congenital malformations of nervous system: Secondary | ICD-10-CM | POA: Diagnosis not present

## 2015-04-09 DIAGNOSIS — I10 Essential (primary) hypertension: Secondary | ICD-10-CM | POA: Diagnosis not present

## 2015-04-09 DIAGNOSIS — Z6838 Body mass index (BMI) 38.0-38.9, adult: Secondary | ICD-10-CM | POA: Diagnosis not present

## 2015-04-09 DIAGNOSIS — E876 Hypokalemia: Secondary | ICD-10-CM | POA: Diagnosis not present

## 2015-04-13 ENCOUNTER — Other Ambulatory Visit: Payer: Self-pay | Admitting: Neurosurgery

## 2015-04-13 DIAGNOSIS — M4802 Spinal stenosis, cervical region: Secondary | ICD-10-CM

## 2015-04-15 DIAGNOSIS — R946 Abnormal results of thyroid function studies: Secondary | ICD-10-CM | POA: Diagnosis not present

## 2015-04-15 DIAGNOSIS — E876 Hypokalemia: Secondary | ICD-10-CM | POA: Diagnosis not present

## 2015-04-15 DIAGNOSIS — E782 Mixed hyperlipidemia: Secondary | ICD-10-CM | POA: Diagnosis not present

## 2015-04-22 ENCOUNTER — Ambulatory Visit
Admission: RE | Admit: 2015-04-22 | Discharge: 2015-04-22 | Disposition: A | Discharge status: Home / Self Care | Payer: Medicare Other | Source: Ambulatory Visit | Attending: Neurosurgery | Admitting: Neurosurgery

## 2015-04-22 DIAGNOSIS — M4802 Spinal stenosis, cervical region: Secondary | ICD-10-CM

## 2015-04-28 DIAGNOSIS — G935 Compression of brain: Secondary | ICD-10-CM | POA: Diagnosis not present

## 2015-06-09 ENCOUNTER — Encounter (INDEPENDENT_AMBULATORY_CARE_PROVIDER_SITE_OTHER): Payer: Medicare Other | Admitting: Ophthalmology

## 2015-06-09 DIAGNOSIS — H43813 Vitreous degeneration, bilateral: Secondary | ICD-10-CM

## 2015-06-09 DIAGNOSIS — H3509 Other intraretinal microvascular abnormalities: Secondary | ICD-10-CM

## 2015-06-09 DIAGNOSIS — H2513 Age-related nuclear cataract, bilateral: Secondary | ICD-10-CM

## 2015-06-09 DIAGNOSIS — H353123 Nonexudative age-related macular degeneration, left eye, advanced atrophic without subfoveal involvement: Secondary | ICD-10-CM

## 2015-06-09 DIAGNOSIS — H3412 Central retinal artery occlusion, left eye: Secondary | ICD-10-CM

## 2015-06-09 DIAGNOSIS — I10 Essential (primary) hypertension: Secondary | ICD-10-CM

## 2015-06-09 DIAGNOSIS — H35033 Hypertensive retinopathy, bilateral: Secondary | ICD-10-CM

## 2015-06-10 DIAGNOSIS — H01003 Unspecified blepharitis right eye, unspecified eyelid: Secondary | ICD-10-CM | POA: Diagnosis not present

## 2015-06-16 DIAGNOSIS — H25042 Posterior subcapsular polar age-related cataract, left eye: Secondary | ICD-10-CM | POA: Diagnosis not present

## 2015-06-16 DIAGNOSIS — H40023 Open angle with borderline findings, high risk, bilateral: Secondary | ICD-10-CM | POA: Diagnosis not present

## 2015-06-16 DIAGNOSIS — H2513 Age-related nuclear cataract, bilateral: Secondary | ICD-10-CM | POA: Diagnosis not present

## 2015-06-16 DIAGNOSIS — H34831 Tributary (branch) retinal vein occlusion, right eye, with macular edema: Secondary | ICD-10-CM | POA: Diagnosis not present

## 2015-06-16 DIAGNOSIS — H4312 Vitreous hemorrhage, left eye: Secondary | ICD-10-CM | POA: Diagnosis not present

## 2015-06-25 ENCOUNTER — Encounter (INDEPENDENT_AMBULATORY_CARE_PROVIDER_SITE_OTHER): Payer: Medicare Other | Admitting: Ophthalmology

## 2015-06-25 DIAGNOSIS — I10 Essential (primary) hypertension: Secondary | ICD-10-CM | POA: Diagnosis not present

## 2015-06-25 DIAGNOSIS — H4312 Vitreous hemorrhage, left eye: Secondary | ICD-10-CM

## 2015-06-25 DIAGNOSIS — H35033 Hypertensive retinopathy, bilateral: Secondary | ICD-10-CM

## 2015-06-25 DIAGNOSIS — H43813 Vitreous degeneration, bilateral: Secondary | ICD-10-CM | POA: Diagnosis not present

## 2015-06-25 DIAGNOSIS — H3509 Other intraretinal microvascular abnormalities: Secondary | ICD-10-CM | POA: Diagnosis not present

## 2015-07-08 ENCOUNTER — Ambulatory Visit (INDEPENDENT_AMBULATORY_CARE_PROVIDER_SITE_OTHER): Payer: Medicare Other | Admitting: Ophthalmology

## 2015-07-08 DIAGNOSIS — I10 Essential (primary) hypertension: Secondary | ICD-10-CM

## 2015-07-08 DIAGNOSIS — H4312 Vitreous hemorrhage, left eye: Secondary | ICD-10-CM | POA: Diagnosis not present

## 2015-07-08 DIAGNOSIS — H3509 Other intraretinal microvascular abnormalities: Secondary | ICD-10-CM

## 2015-07-08 DIAGNOSIS — H43813 Vitreous degeneration, bilateral: Secondary | ICD-10-CM | POA: Diagnosis not present

## 2015-07-08 DIAGNOSIS — H35033 Hypertensive retinopathy, bilateral: Secondary | ICD-10-CM

## 2015-08-09 ENCOUNTER — Encounter (INDEPENDENT_AMBULATORY_CARE_PROVIDER_SITE_OTHER): Payer: Medicare Other | Admitting: Ophthalmology

## 2015-08-09 DIAGNOSIS — H43813 Vitreous degeneration, bilateral: Secondary | ICD-10-CM | POA: Diagnosis not present

## 2015-08-09 DIAGNOSIS — H3412 Central retinal artery occlusion, left eye: Secondary | ICD-10-CM | POA: Diagnosis not present

## 2015-08-09 DIAGNOSIS — I10 Essential (primary) hypertension: Secondary | ICD-10-CM | POA: Diagnosis not present

## 2015-08-09 DIAGNOSIS — H35033 Hypertensive retinopathy, bilateral: Secondary | ICD-10-CM | POA: Diagnosis not present

## 2015-08-09 DIAGNOSIS — H3509 Other intraretinal microvascular abnormalities: Secondary | ICD-10-CM

## 2015-08-09 DIAGNOSIS — H2513 Age-related nuclear cataract, bilateral: Secondary | ICD-10-CM | POA: Diagnosis not present

## 2015-09-06 DIAGNOSIS — Q078 Other specified congenital malformations of nervous system: Secondary | ICD-10-CM | POA: Diagnosis not present

## 2015-09-06 DIAGNOSIS — H35042 Retinal micro-aneurysms, unspecified, left eye: Secondary | ICD-10-CM | POA: Diagnosis not present

## 2015-09-06 DIAGNOSIS — K219 Gastro-esophageal reflux disease without esophagitis: Secondary | ICD-10-CM | POA: Diagnosis not present

## 2015-09-06 DIAGNOSIS — I10 Essential (primary) hypertension: Secondary | ICD-10-CM | POA: Diagnosis not present

## 2015-09-06 DIAGNOSIS — E782 Mixed hyperlipidemia: Secondary | ICD-10-CM | POA: Diagnosis not present

## 2015-09-06 DIAGNOSIS — Z6839 Body mass index (BMI) 39.0-39.9, adult: Secondary | ICD-10-CM | POA: Diagnosis not present

## 2015-09-06 DIAGNOSIS — I519 Heart disease, unspecified: Secondary | ICD-10-CM | POA: Diagnosis not present

## 2015-09-06 DIAGNOSIS — R252 Cramp and spasm: Secondary | ICD-10-CM | POA: Diagnosis not present

## 2015-09-06 DIAGNOSIS — R946 Abnormal results of thyroid function studies: Secondary | ICD-10-CM | POA: Diagnosis not present

## 2015-09-21 ENCOUNTER — Other Ambulatory Visit: Payer: Self-pay | Admitting: Internal Medicine

## 2015-09-21 DIAGNOSIS — Z1231 Encounter for screening mammogram for malignant neoplasm of breast: Secondary | ICD-10-CM

## 2015-10-04 DIAGNOSIS — Z23 Encounter for immunization: Secondary | ICD-10-CM | POA: Diagnosis not present

## 2015-10-11 ENCOUNTER — Ambulatory Visit (INDEPENDENT_AMBULATORY_CARE_PROVIDER_SITE_OTHER): Payer: Medicare Other | Admitting: Ophthalmology

## 2015-10-26 ENCOUNTER — Ambulatory Visit
Admission: RE | Admit: 2015-10-26 | Discharge: 2015-10-26 | Disposition: A | Discharge status: Home / Self Care | Payer: Medicare Other | Source: Ambulatory Visit | Attending: Internal Medicine | Admitting: Internal Medicine

## 2015-10-26 DIAGNOSIS — Z1231 Encounter for screening mammogram for malignant neoplasm of breast: Secondary | ICD-10-CM

## 2015-10-28 DIAGNOSIS — Z13820 Encounter for screening for osteoporosis: Secondary | ICD-10-CM | POA: Diagnosis not present

## 2015-11-10 ENCOUNTER — Encounter (INDEPENDENT_AMBULATORY_CARE_PROVIDER_SITE_OTHER): Payer: Medicare Other | Admitting: Ophthalmology

## 2015-11-10 DIAGNOSIS — I10 Essential (primary) hypertension: Secondary | ICD-10-CM

## 2015-11-10 DIAGNOSIS — H2513 Age-related nuclear cataract, bilateral: Secondary | ICD-10-CM | POA: Diagnosis not present

## 2015-11-10 DIAGNOSIS — H4312 Vitreous hemorrhage, left eye: Secondary | ICD-10-CM | POA: Diagnosis not present

## 2015-11-10 DIAGNOSIS — H43813 Vitreous degeneration, bilateral: Secondary | ICD-10-CM

## 2015-11-10 DIAGNOSIS — H35033 Hypertensive retinopathy, bilateral: Secondary | ICD-10-CM

## 2015-11-10 DIAGNOSIS — H3509 Other intraretinal microvascular abnormalities: Secondary | ICD-10-CM | POA: Diagnosis not present

## 2015-11-24 DIAGNOSIS — H25042 Posterior subcapsular polar age-related cataract, left eye: Secondary | ICD-10-CM | POA: Diagnosis not present

## 2015-11-24 DIAGNOSIS — H2513 Age-related nuclear cataract, bilateral: Secondary | ICD-10-CM | POA: Diagnosis not present

## 2015-11-24 DIAGNOSIS — H35373 Puckering of macula, bilateral: Secondary | ICD-10-CM | POA: Diagnosis not present

## 2015-11-24 DIAGNOSIS — H25013 Cortical age-related cataract, bilateral: Secondary | ICD-10-CM | POA: Diagnosis not present

## 2015-11-24 DIAGNOSIS — H2512 Age-related nuclear cataract, left eye: Secondary | ICD-10-CM | POA: Diagnosis not present

## 2015-11-24 DIAGNOSIS — H34832 Tributary (branch) retinal vein occlusion, left eye, with macular edema: Secondary | ICD-10-CM | POA: Diagnosis not present

## 2015-12-29 DIAGNOSIS — H25812 Combined forms of age-related cataract, left eye: Secondary | ICD-10-CM | POA: Diagnosis not present

## 2015-12-29 DIAGNOSIS — H25012 Cortical age-related cataract, left eye: Secondary | ICD-10-CM | POA: Diagnosis not present

## 2015-12-29 DIAGNOSIS — H2512 Age-related nuclear cataract, left eye: Secondary | ICD-10-CM | POA: Diagnosis not present

## 2016-01-06 DIAGNOSIS — R946 Abnormal results of thyroid function studies: Secondary | ICD-10-CM | POA: Diagnosis not present

## 2016-01-06 DIAGNOSIS — E782 Mixed hyperlipidemia: Secondary | ICD-10-CM | POA: Diagnosis not present

## 2016-01-06 DIAGNOSIS — I10 Essential (primary) hypertension: Secondary | ICD-10-CM | POA: Diagnosis not present

## 2016-01-13 DIAGNOSIS — E782 Mixed hyperlipidemia: Secondary | ICD-10-CM | POA: Diagnosis not present

## 2016-01-13 DIAGNOSIS — M1990 Unspecified osteoarthritis, unspecified site: Secondary | ICD-10-CM | POA: Diagnosis not present

## 2016-01-13 DIAGNOSIS — F419 Anxiety disorder, unspecified: Secondary | ICD-10-CM | POA: Diagnosis not present

## 2016-01-13 DIAGNOSIS — K219 Gastro-esophageal reflux disease without esophagitis: Secondary | ICD-10-CM | POA: Diagnosis not present

## 2016-01-13 DIAGNOSIS — H35042 Retinal micro-aneurysms, unspecified, left eye: Secondary | ICD-10-CM | POA: Diagnosis not present

## 2016-01-13 DIAGNOSIS — I519 Heart disease, unspecified: Secondary | ICD-10-CM | POA: Diagnosis not present

## 2016-01-13 DIAGNOSIS — I10 Essential (primary) hypertension: Secondary | ICD-10-CM | POA: Diagnosis not present

## 2016-01-13 DIAGNOSIS — Z0000 Encounter for general adult medical examination without abnormal findings: Secondary | ICD-10-CM | POA: Diagnosis not present

## 2016-01-13 DIAGNOSIS — Q078 Other specified congenital malformations of nervous system: Secondary | ICD-10-CM | POA: Diagnosis not present

## 2016-01-13 DIAGNOSIS — K5730 Diverticulosis of large intestine without perforation or abscess without bleeding: Secondary | ICD-10-CM | POA: Diagnosis not present

## 2016-01-13 DIAGNOSIS — D72819 Decreased white blood cell count, unspecified: Secondary | ICD-10-CM | POA: Diagnosis not present

## 2016-01-13 DIAGNOSIS — Z853 Personal history of malignant neoplasm of breast: Secondary | ICD-10-CM | POA: Diagnosis not present

## 2016-01-19 DIAGNOSIS — Z1212 Encounter for screening for malignant neoplasm of rectum: Secondary | ICD-10-CM | POA: Diagnosis not present

## 2016-01-24 HISTORY — PX: CATARACT EXTRACTION: SUR2

## 2016-01-28 DIAGNOSIS — D071 Carcinoma in situ of vulva: Secondary | ICD-10-CM | POA: Diagnosis not present

## 2016-01-28 DIAGNOSIS — Z853 Personal history of malignant neoplasm of breast: Secondary | ICD-10-CM | POA: Diagnosis not present

## 2016-01-28 DIAGNOSIS — Z6839 Body mass index (BMI) 39.0-39.9, adult: Secondary | ICD-10-CM | POA: Diagnosis not present

## 2016-01-28 DIAGNOSIS — L570 Actinic keratosis: Secondary | ICD-10-CM | POA: Diagnosis not present

## 2016-01-28 DIAGNOSIS — Z01419 Encounter for gynecological examination (general) (routine) without abnormal findings: Secondary | ICD-10-CM | POA: Diagnosis not present

## 2016-02-17 ENCOUNTER — Other Ambulatory Visit: Payer: Self-pay | Admitting: Obstetrics and Gynecology

## 2016-02-17 DIAGNOSIS — D071 Carcinoma in situ of vulva: Secondary | ICD-10-CM | POA: Diagnosis not present

## 2016-02-17 DIAGNOSIS — N900 Mild vulvar dysplasia: Secondary | ICD-10-CM | POA: Diagnosis not present

## 2016-02-25 DIAGNOSIS — D071 Carcinoma in situ of vulva: Secondary | ICD-10-CM | POA: Diagnosis not present

## 2016-02-25 DIAGNOSIS — A630 Anogenital (venereal) warts: Secondary | ICD-10-CM | POA: Diagnosis not present

## 2016-03-16 DIAGNOSIS — J029 Acute pharyngitis, unspecified: Secondary | ICD-10-CM | POA: Diagnosis not present

## 2016-03-16 DIAGNOSIS — M1990 Unspecified osteoarthritis, unspecified site: Secondary | ICD-10-CM | POA: Diagnosis not present

## 2016-03-16 DIAGNOSIS — K219 Gastro-esophageal reflux disease without esophagitis: Secondary | ICD-10-CM | POA: Diagnosis not present

## 2016-03-16 DIAGNOSIS — J309 Allergic rhinitis, unspecified: Secondary | ICD-10-CM | POA: Diagnosis not present

## 2016-03-16 DIAGNOSIS — R05 Cough: Secondary | ICD-10-CM | POA: Diagnosis not present

## 2016-03-16 DIAGNOSIS — J45909 Unspecified asthma, uncomplicated: Secondary | ICD-10-CM | POA: Diagnosis not present

## 2016-03-30 DIAGNOSIS — H01003 Unspecified blepharitis right eye, unspecified eyelid: Secondary | ICD-10-CM | POA: Diagnosis not present

## 2016-05-11 ENCOUNTER — Ambulatory Visit (INDEPENDENT_AMBULATORY_CARE_PROVIDER_SITE_OTHER): Payer: Medicare Other | Admitting: Ophthalmology

## 2016-05-11 DIAGNOSIS — H35372 Puckering of macula, left eye: Secondary | ICD-10-CM

## 2016-05-11 DIAGNOSIS — I10 Essential (primary) hypertension: Secondary | ICD-10-CM

## 2016-05-11 DIAGNOSIS — H3509 Other intraretinal microvascular abnormalities: Secondary | ICD-10-CM

## 2016-05-11 DIAGNOSIS — H43813 Vitreous degeneration, bilateral: Secondary | ICD-10-CM | POA: Diagnosis not present

## 2016-05-11 DIAGNOSIS — H35033 Hypertensive retinopathy, bilateral: Secondary | ICD-10-CM

## 2016-05-11 DIAGNOSIS — H2511 Age-related nuclear cataract, right eye: Secondary | ICD-10-CM | POA: Diagnosis not present

## 2016-07-05 DIAGNOSIS — F419 Anxiety disorder, unspecified: Secondary | ICD-10-CM | POA: Diagnosis not present

## 2016-07-05 DIAGNOSIS — I10 Essential (primary) hypertension: Secondary | ICD-10-CM | POA: Diagnosis not present

## 2016-07-05 DIAGNOSIS — Z6841 Body Mass Index (BMI) 40.0 and over, adult: Secondary | ICD-10-CM | POA: Diagnosis not present

## 2016-07-05 DIAGNOSIS — D72819 Decreased white blood cell count, unspecified: Secondary | ICD-10-CM | POA: Diagnosis not present

## 2016-07-05 DIAGNOSIS — I519 Heart disease, unspecified: Secondary | ICD-10-CM | POA: Diagnosis not present

## 2016-07-05 DIAGNOSIS — H35042 Retinal micro-aneurysms, unspecified, left eye: Secondary | ICD-10-CM | POA: Diagnosis not present

## 2016-07-05 DIAGNOSIS — M1990 Unspecified osteoarthritis, unspecified site: Secondary | ICD-10-CM | POA: Diagnosis not present

## 2016-07-05 DIAGNOSIS — K219 Gastro-esophageal reflux disease without esophagitis: Secondary | ICD-10-CM | POA: Diagnosis not present

## 2016-07-05 DIAGNOSIS — J45909 Unspecified asthma, uncomplicated: Secondary | ICD-10-CM | POA: Diagnosis not present

## 2016-08-11 DIAGNOSIS — H40023 Open angle with borderline findings, high risk, bilateral: Secondary | ICD-10-CM | POA: Diagnosis not present

## 2016-08-11 DIAGNOSIS — H01003 Unspecified blepharitis right eye, unspecified eyelid: Secondary | ICD-10-CM | POA: Diagnosis not present

## 2016-08-25 DIAGNOSIS — Z6841 Body Mass Index (BMI) 40.0 and over, adult: Secondary | ICD-10-CM | POA: Diagnosis not present

## 2016-08-25 DIAGNOSIS — M5416 Radiculopathy, lumbar region: Secondary | ICD-10-CM | POA: Diagnosis not present

## 2016-09-06 DIAGNOSIS — M5416 Radiculopathy, lumbar region: Secondary | ICD-10-CM | POA: Diagnosis not present

## 2016-09-12 DIAGNOSIS — M5416 Radiculopathy, lumbar region: Secondary | ICD-10-CM | POA: Diagnosis not present

## 2016-09-15 DIAGNOSIS — M5416 Radiculopathy, lumbar region: Secondary | ICD-10-CM | POA: Diagnosis not present

## 2016-09-19 DIAGNOSIS — M5416 Radiculopathy, lumbar region: Secondary | ICD-10-CM | POA: Diagnosis not present

## 2016-09-21 DIAGNOSIS — M5416 Radiculopathy, lumbar region: Secondary | ICD-10-CM | POA: Diagnosis not present

## 2016-09-26 ENCOUNTER — Other Ambulatory Visit: Payer: Self-pay | Admitting: Obstetrics and Gynecology

## 2016-09-26 DIAGNOSIS — Z1231 Encounter for screening mammogram for malignant neoplasm of breast: Secondary | ICD-10-CM

## 2016-09-27 DIAGNOSIS — M5416 Radiculopathy, lumbar region: Secondary | ICD-10-CM | POA: Diagnosis not present

## 2016-09-29 DIAGNOSIS — M5416 Radiculopathy, lumbar region: Secondary | ICD-10-CM | POA: Diagnosis not present

## 2016-10-10 DIAGNOSIS — M1712 Unilateral primary osteoarthritis, left knee: Secondary | ICD-10-CM | POA: Diagnosis not present

## 2016-10-10 DIAGNOSIS — M25562 Pain in left knee: Secondary | ICD-10-CM | POA: Diagnosis not present

## 2016-10-10 DIAGNOSIS — G8929 Other chronic pain: Secondary | ICD-10-CM | POA: Diagnosis not present

## 2016-10-16 DIAGNOSIS — M5416 Radiculopathy, lumbar region: Secondary | ICD-10-CM | POA: Diagnosis not present

## 2016-10-26 ENCOUNTER — Ambulatory Visit
Admission: RE | Admit: 2016-10-26 | Discharge: 2016-10-26 | Disposition: A | Discharge status: Home / Self Care | Payer: Medicare Other | Source: Ambulatory Visit | Attending: Obstetrics and Gynecology | Admitting: Obstetrics and Gynecology

## 2016-10-26 DIAGNOSIS — Z1231 Encounter for screening mammogram for malignant neoplasm of breast: Secondary | ICD-10-CM | POA: Diagnosis not present

## 2016-10-26 HISTORY — DX: Personal history of irradiation: Z92.3

## 2016-10-27 DIAGNOSIS — M5416 Radiculopathy, lumbar region: Secondary | ICD-10-CM | POA: Diagnosis not present

## 2016-10-30 DIAGNOSIS — Z23 Encounter for immunization: Secondary | ICD-10-CM | POA: Diagnosis not present

## 2016-10-31 DIAGNOSIS — M5416 Radiculopathy, lumbar region: Secondary | ICD-10-CM | POA: Diagnosis not present

## 2016-11-14 DIAGNOSIS — M5416 Radiculopathy, lumbar region: Secondary | ICD-10-CM | POA: Diagnosis not present

## 2016-12-04 DIAGNOSIS — M1711 Unilateral primary osteoarthritis, right knee: Secondary | ICD-10-CM | POA: Diagnosis not present

## 2016-12-04 DIAGNOSIS — M1712 Unilateral primary osteoarthritis, left knee: Secondary | ICD-10-CM | POA: Diagnosis not present

## 2016-12-25 DIAGNOSIS — M1712 Unilateral primary osteoarthritis, left knee: Secondary | ICD-10-CM | POA: Diagnosis not present

## 2017-01-03 DIAGNOSIS — M1712 Unilateral primary osteoarthritis, left knee: Secondary | ICD-10-CM | POA: Diagnosis not present

## 2017-01-10 DIAGNOSIS — M1712 Unilateral primary osteoarthritis, left knee: Secondary | ICD-10-CM | POA: Diagnosis not present

## 2017-01-22 DIAGNOSIS — R05 Cough: Secondary | ICD-10-CM | POA: Diagnosis not present

## 2017-01-22 DIAGNOSIS — Z6839 Body mass index (BMI) 39.0-39.9, adult: Secondary | ICD-10-CM | POA: Diagnosis not present

## 2017-01-22 DIAGNOSIS — J209 Acute bronchitis, unspecified: Secondary | ICD-10-CM | POA: Diagnosis not present

## 2017-01-23 HISTORY — PX: MOUTH SURGERY: SHX715

## 2017-01-26 DIAGNOSIS — R82998 Other abnormal findings in urine: Secondary | ICD-10-CM | POA: Diagnosis not present

## 2017-01-26 DIAGNOSIS — E782 Mixed hyperlipidemia: Secondary | ICD-10-CM | POA: Diagnosis not present

## 2017-01-26 DIAGNOSIS — I10 Essential (primary) hypertension: Secondary | ICD-10-CM | POA: Diagnosis not present

## 2017-01-28 ENCOUNTER — Other Ambulatory Visit: Payer: Self-pay

## 2017-01-28 ENCOUNTER — Encounter (HOSPITAL_COMMUNITY): Payer: Self-pay | Admitting: Emergency Medicine

## 2017-01-28 DIAGNOSIS — I10 Essential (primary) hypertension: Secondary | ICD-10-CM | POA: Diagnosis not present

## 2017-01-28 DIAGNOSIS — R51 Headache: Secondary | ICD-10-CM | POA: Diagnosis not present

## 2017-01-28 DIAGNOSIS — Z853 Personal history of malignant neoplasm of breast: Secondary | ICD-10-CM | POA: Diagnosis not present

## 2017-01-28 DIAGNOSIS — K5900 Constipation, unspecified: Secondary | ICD-10-CM | POA: Diagnosis not present

## 2017-01-28 DIAGNOSIS — J45909 Unspecified asthma, uncomplicated: Secondary | ICD-10-CM | POA: Insufficient documentation

## 2017-01-28 DIAGNOSIS — T887XXA Unspecified adverse effect of drug or medicament, initial encounter: Secondary | ICD-10-CM | POA: Diagnosis not present

## 2017-01-28 DIAGNOSIS — T50995A Adverse effect of other drugs, medicaments and biological substances, initial encounter: Secondary | ICD-10-CM | POA: Diagnosis not present

## 2017-01-28 DIAGNOSIS — R930 Abnormal findings on diagnostic imaging of skull and head, not elsewhere classified: Secondary | ICD-10-CM | POA: Diagnosis not present

## 2017-01-28 DIAGNOSIS — B370 Candidal stomatitis: Secondary | ICD-10-CM | POA: Insufficient documentation

## 2017-01-28 DIAGNOSIS — Z87891 Personal history of nicotine dependence: Secondary | ICD-10-CM | POA: Insufficient documentation

## 2017-01-28 DIAGNOSIS — Y69 Unspecified misadventure during surgical and medical care: Secondary | ICD-10-CM | POA: Diagnosis not present

## 2017-01-28 DIAGNOSIS — Z79899 Other long term (current) drug therapy: Secondary | ICD-10-CM | POA: Insufficient documentation

## 2017-01-28 DIAGNOSIS — R05 Cough: Secondary | ICD-10-CM | POA: Diagnosis not present

## 2017-01-28 NOTE — ED Triage Notes (Signed)
Pt to ED with c/o cold symptoms since 12/24.  Pt is currently taking antibiotics.  Pt also c/o being constipated.

## 2017-01-29 ENCOUNTER — Encounter (HOSPITAL_COMMUNITY): Payer: Self-pay | Admitting: Emergency Medicine

## 2017-01-29 ENCOUNTER — Emergency Department (HOSPITAL_COMMUNITY)
Admission: EM | Admit: 2017-01-29 | Discharge: 2017-01-29 | Disposition: A | Discharge status: Home / Self Care | Payer: Medicare Other | Attending: Emergency Medicine | Admitting: Emergency Medicine

## 2017-01-29 ENCOUNTER — Emergency Department (HOSPITAL_COMMUNITY): Payer: Medicare Other

## 2017-01-29 DIAGNOSIS — B370 Candidal stomatitis: Secondary | ICD-10-CM

## 2017-01-29 DIAGNOSIS — K5900 Constipation, unspecified: Secondary | ICD-10-CM | POA: Diagnosis not present

## 2017-01-29 DIAGNOSIS — R930 Abnormal findings on diagnostic imaging of skull and head, not elsewhere classified: Secondary | ICD-10-CM | POA: Diagnosis not present

## 2017-01-29 DIAGNOSIS — T50905A Adverse effect of unspecified drugs, medicaments and biological substances, initial encounter: Secondary | ICD-10-CM

## 2017-01-29 DIAGNOSIS — R05 Cough: Secondary | ICD-10-CM | POA: Diagnosis not present

## 2017-01-29 MED ORDER — ACETAMINOPHEN 500 MG PO TABS
1000.0000 mg | ORAL_TABLET | Freq: Once | ORAL | Status: AC
  Administered 2017-01-29: 1000 mg via ORAL
  Filled 2017-01-29: qty 2

## 2017-01-29 MED ORDER — IBUPROFEN 800 MG PO TABS
800.0000 mg | ORAL_TABLET | Freq: Once | ORAL | Status: AC
  Administered 2017-01-29: 800 mg via ORAL
  Filled 2017-01-29: qty 1

## 2017-01-29 MED ORDER — NYSTATIN 100000 UNIT/ML MT SUSP: 500000.0000 [IU] | Freq: Four times a day (QID) | OROMUCOSAL | 0 refills | Status: DC

## 2017-01-29 NOTE — Discharge Instructions (Signed)
Miralax 1 capful twice daily x7 days then one capful each morning

## 2017-01-29 NOTE — ED Provider Notes (Signed)
[facility] EMERGENCY DEPARTMENT Provider Note   CSN: [id number] Arrival date & time: 01/28/17  1819     History   Chief Complaint Chief Complaint  Patient presents with  . Headache  . Constipation  . Cough    HPI Bridget Cortez is a 72 y.o. female.  The history is provided by the patient.  Cough  This is a new problem. The current episode started more than 1 week ago. The problem occurs hourly. The problem has not changed since onset.The cough is non-productive. There has been no fever. Associated symptoms include headaches. Pertinent negatives include no chest pain, no chills, no sweats, no weight loss, no ear congestion, no ear pain, no rhinorrhea, no sore throat, no myalgias, no shortness of breath, no wheezing and no eye redness. She has tried cough syrup (ceftin, narcotic cough syrup, steroids, ) for the symptoms. The treatment provided no relief. She is not a smoker. Her past medical history is significant for asthma.    Past Medical History:  Diagnosis Date  . Abnormal Pap smear 2006   vaginal medicine according to patient  . Asthma   . 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 Breast cancer ([place]) 2007   Left  . Chiari malformation   . Diverticulosis   . GERD (gastroesophageal reflux disease)   . H/O osteopenia    08/2006  . H/O varicella   . Heart murmur   . Hypertension   . Monilial vaginitis 07/2007  . Multiple allergies   . Ovarian cyst   . Personal history of colonic adenomas 03/13/2012  . Personal history of radiation therapy 1990  . Pneumonia 07/2014  . 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 Yeast infection     Patient Active Problem List   Diagnosis Date Noted  . CAP (community acquired pneumonia) 08/06/2014  . Hyponatremia 08/06/2014  . Asthma 08/06/2014  . Anxiety about health 08/06/2014  . Hypertension 08/06/2014  . Personal history of colonic adenomas 03/20/2012  . Chiari malformation   . Heart murmur   . H/O  varicella   . Yeast infection   . Ovarian cyst   . Cancer ([place])   . H/O osteopenia   . Abnormal Pap smear   . Vulvar intraepithelial neoplasia III (VIN III) 04/12/2009  . VIN III (vulvar intraepithelial neoplasia III) 03/23/2009  . Monilial vaginitis 07/24/2007  . Atrophy of vagina 06/23/2005    Past Surgical History:  Procedure Laterality Date  . ABDOMINAL HYSTERECTOMY    . ABDOMINAL SURGERY    . BREAST LUMPECTOMY Left 1990  . BREAST SURGERY     Lumpectomy, left breast  . COLONOSCOPY W/ BIOPSIES    . HERNIA REPAIR    . KNEE SURGERY     right    OB History    Gravida Para Term Preterm AB Living   0 0   0   1   SAB TAB Ectopic Multiple Live Births                  Obstetric Comments   PT HAS A ADOPTED CHILD GIRL       Home Medications    Prior to Admission medications   Medication Sig Start Date End Date Taking? Authorizing Provider  acetaminophen (TYLENOL) 325 MG tablet Take 2 tablets (650 mg total) by mouth every 6 (six) hours as needed for mild pain, fever or headache (or Fever >/= 101). 08/11/14   Hongalgi, Lenis Dickinson, MD  albuterol (  PROVENTIL) (2.5 MG/3ML) 0.083% nebulizer solution Take 2.5 mg by nebulization every 6 (six) hours as needed. For shortness of breath    [provider]  amLODipine (NORVASC) 10 MG tablet Take 10 mg by mouth every evening.     [provider]  Calcium Carbonate-Vitamin D (CALTRATE 600+D PO) Take 1 tablet by mouth daily.    [provider]  celecoxib (CELEBREX) 200 MG capsule Take 200 mg by mouth daily as needed for moderate pain.     [provider]  cetirizine (ZYRTEC) 10 MG tablet Take 10 mg by mouth daily.    [provider]  esomeprazole (NEXIUM) 40 MG capsule Take 40 mg by mouth daily before breakfast.    [provider]  fluticasone (FLONASE) 50 MCG/ACT nasal spray Place 1 spray into the nose at bedtime.  03/16/12   [provider]  Fluticasone-Salmeterol (ADVAIR) 250-50  MCG/DOSE AEPB Inhale 1 puff into the lungs every 12 (twelve) hours.    [provider]  losartan (COZAAR) 50 MG tablet Take 50 mg by mouth daily.    [provider]  menthol-cetylpyridinium (CEPACOL) 3 MG lozenge Take 1 lozenge (3 mg total) by mouth as needed for sore throat. 08/11/14   Hongalgi, Lenis Dickinson, MD  Multiple Vitamin (MULTIVITAMIN) tablet Take 1 tablet by mouth daily.    [provider]  nystatin (MYCOSTATIN) 100000 UNIT/ML suspension Take 5 mLs by mouth 4 (four) times daily.    [provider]  polyethylene glycol (MIRALAX / GLYCOLAX) packet Take 17 g by mouth daily as needed for mild constipation or moderate constipation.    [provider]  potassium chloride SA (K-DUR,KLOR-CON) 20 MEQ tablet Take 2 tablets (40 mEq total) by mouth daily. Patient taking differently: Take 20 mEq by mouth 3 (three) times daily.  08/11/14   Hongalgi, Lenis Dickinson, MD  simvastatin (ZOCOR) 20 MG tablet Take 20 mg by mouth every evening.    [provider]  triamcinolone (KENALOG) 0.1 % paste Use as directed 1 application in the mouth or throat 2 (two) times daily.    [provider]  vitamin C (ASCORBIC ACID) 500 MG tablet Take 500 mg by mouth daily.    [provider]    Family History Family History  Problem Relation Age of Onset  . Cancer Mother   . Heart failure Other   . Colon cancer Neg Hx   . Esophageal cancer Neg Hx   . Rectal cancer Neg Hx   . Stomach cancer Neg Hx     Social History Social History   Tobacco Use  . Smoking status: Former Smoker    Last attempt to quit: 01/23/1978    Years since quitting: 39.0  . Smokeless tobacco: Never Used  Substance Use Topics  . Alcohol use: No  . Drug use: No     Allergies   Ephedrine   Review of Systems Review of Systems  Constitutional: Negative for chills, fever and weight loss.  HENT: Negative for congestion, dental problem, ear discharge, ear pain, rhinorrhea, sinus  pressure, sinus pain and sore throat.   Eyes: Negative for redness and visual disturbance.  Respiratory: Positive for cough. Negative for chest tightness, shortness of breath and wheezing.   Cardiovascular: Negative for chest pain, palpitations and leg swelling.  Musculoskeletal: Negative for myalgias, neck pain and neck stiffness.  Neurological: Positive for headaches. Negative for dizziness, tremors, seizures, syncope, facial asymmetry, speech difficulty, weakness, light-headedness and numbness.  All other systems  reviewed and are negative.    Physical Exam Updated Vital Signs BP (!) 125/92 (BP Location: Right Arm)   Pulse 70   Temp 98.1 F (36.7 C)   Resp 19   Ht 5' 7.5" (1.715 m)   Wt 111.1 kg (245 lb)   SpO2 100%   BMI 37.81 kg/m   Physical Exam  Constitutional: She is oriented to person, place, and time. She appears well-developed and well-nourished. No distress.  HENT:  Head: Normocephalic and atraumatic.  Nose: Nose normal.  thrush  Eyes: Conjunctivae and EOM are normal. Pupils are equal, round, and reactive to light.  No proptosis  Neck: Normal range of motion. Neck supple. No JVD present.  Cardiovascular: Normal rate, regular rhythm, normal heart sounds and intact distal pulses.  Pulmonary/Chest: Effort normal and breath sounds normal. No stridor. No respiratory distress. She has no wheezes. She has no rales.  Abdominal: Soft. Bowel sounds are normal. She exhibits no mass. There is no tenderness. There is no rebound and no guarding.  Musculoskeletal: Normal range of motion.  Lymphadenopathy:    She has no cervical adenopathy.  Neurological: She is alert and oriented to person, place, and time. She displays normal reflexes. No cranial nerve deficit. GCS eye subscore is 4. GCS verbal subscore is 5. GCS motor subscore is 6.  Intact cognition disks sharp  Skin: Skin is dry. Capillary refill takes less than 2 seconds.  Psychiatric: She has a normal mood and affect.      ED Treatments / Results   Vitals:   01/28/17 2146 01/29/17 0126  BP: 118/68 (!) 125/92  Pulse: 67 70  Resp: 18 19  Temp:    SpO2: 100% 100%    Radiology No results found.  Procedures Procedures (including critical care time)  Medications Ordered in ED Medications  acetaminophen (TYLENOL) tablet 1,000 mg (not administered)  ibuprofen (ADVIL,MOTRIN) tablet 800 mg (not administered)       Final Clinical Impressions(s) / ED Diagnoses   Will treat for thrush.  HA is a medication effect.  Stop narcotic cough syrup.  No indication for Advanced imaging or LP at this time.  Follow up with your PMD.   Return for worsening pain, fevers > 100.4 unrelieved by medication,  intractable vomiting, or diarrhea, abdominal pain, Inability to tolerate liquids or food, cough, altered mental status or any concerns. No signs of systemic illness or infection. The patient is nontoxic-appearing on exam and vital signs are within normal limits.    I have reviewed the triage vital signs and the nursing notes. Pertinent labs &imaging results that were available during my care of the patient were reviewed by me and considered in my medical decision making (see chart for details).  After history, exam, and medical workup I feel the patient has been appropriately medically screened and is safe for discharge home. Pertinent diagnoses were discussed with the patient. Patient was given return precautions       Zaidan Keeble, MD 01/29/17 0340

## 2017-01-31 DIAGNOSIS — F418 Other specified anxiety disorders: Secondary | ICD-10-CM | POA: Diagnosis not present

## 2017-01-31 DIAGNOSIS — E871 Hypo-osmolality and hyponatremia: Secondary | ICD-10-CM | POA: Diagnosis not present

## 2017-01-31 DIAGNOSIS — E782 Mixed hyperlipidemia: Secondary | ICD-10-CM | POA: Diagnosis not present

## 2017-01-31 DIAGNOSIS — Z1389 Encounter for screening for other disorder: Secondary | ICD-10-CM | POA: Diagnosis not present

## 2017-01-31 DIAGNOSIS — Z6839 Body mass index (BMI) 39.0-39.9, adult: Secondary | ICD-10-CM | POA: Diagnosis not present

## 2017-01-31 DIAGNOSIS — K5730 Diverticulosis of large intestine without perforation or abscess without bleeding: Secondary | ICD-10-CM | POA: Diagnosis not present

## 2017-01-31 DIAGNOSIS — Z0000 Encounter for general adult medical examination without abnormal findings: Secondary | ICD-10-CM | POA: Diagnosis not present

## 2017-01-31 DIAGNOSIS — I519 Heart disease, unspecified: Secondary | ICD-10-CM | POA: Diagnosis not present

## 2017-01-31 DIAGNOSIS — H35042 Retinal micro-aneurysms, unspecified, left eye: Secondary | ICD-10-CM | POA: Diagnosis not present

## 2017-01-31 DIAGNOSIS — I10 Essential (primary) hypertension: Secondary | ICD-10-CM | POA: Diagnosis not present

## 2017-01-31 DIAGNOSIS — J209 Acute bronchitis, unspecified: Secondary | ICD-10-CM | POA: Diagnosis not present

## 2017-01-31 DIAGNOSIS — Q079 Congenital malformation of nervous system, unspecified: Secondary | ICD-10-CM | POA: Diagnosis not present

## 2017-02-06 DIAGNOSIS — Z1212 Encounter for screening for malignant neoplasm of rectum: Secondary | ICD-10-CM | POA: Diagnosis not present

## 2017-02-07 DIAGNOSIS — Z01419 Encounter for gynecological examination (general) (routine) without abnormal findings: Secondary | ICD-10-CM | POA: Diagnosis not present

## 2017-02-07 DIAGNOSIS — D071 Carcinoma in situ of vulva: Secondary | ICD-10-CM | POA: Diagnosis not present

## 2017-02-07 DIAGNOSIS — Z853 Personal history of malignant neoplasm of breast: Secondary | ICD-10-CM | POA: Diagnosis not present

## 2017-02-07 DIAGNOSIS — Z6841 Body Mass Index (BMI) 40.0 and over, adult: Secondary | ICD-10-CM | POA: Diagnosis not present

## 2017-02-12 DIAGNOSIS — M1712 Unilateral primary osteoarthritis, left knee: Secondary | ICD-10-CM | POA: Diagnosis not present

## 2017-02-12 DIAGNOSIS — M1711 Unilateral primary osteoarthritis, right knee: Secondary | ICD-10-CM | POA: Diagnosis not present

## 2017-02-21 DIAGNOSIS — R262 Difficulty in walking, not elsewhere classified: Secondary | ICD-10-CM | POA: Diagnosis not present

## 2017-02-21 DIAGNOSIS — R600 Localized edema: Secondary | ICD-10-CM | POA: Diagnosis not present

## 2017-02-21 DIAGNOSIS — M170 Bilateral primary osteoarthritis of knee: Secondary | ICD-10-CM | POA: Diagnosis not present

## 2017-02-23 DIAGNOSIS — H34832 Tributary (branch) retinal vein occlusion, left eye, with macular edema: Secondary | ICD-10-CM | POA: Diagnosis not present

## 2017-02-23 DIAGNOSIS — H2511 Age-related nuclear cataract, right eye: Secondary | ICD-10-CM | POA: Diagnosis not present

## 2017-02-23 DIAGNOSIS — H40023 Open angle with borderline findings, high risk, bilateral: Secondary | ICD-10-CM | POA: Diagnosis not present

## 2017-02-23 DIAGNOSIS — H25011 Cortical age-related cataract, right eye: Secondary | ICD-10-CM | POA: Diagnosis not present

## 2017-02-27 DIAGNOSIS — R262 Difficulty in walking, not elsewhere classified: Secondary | ICD-10-CM | POA: Diagnosis not present

## 2017-02-27 DIAGNOSIS — M170 Bilateral primary osteoarthritis of knee: Secondary | ICD-10-CM | POA: Diagnosis not present

## 2017-02-27 DIAGNOSIS — R600 Localized edema: Secondary | ICD-10-CM | POA: Diagnosis not present

## 2017-02-28 DIAGNOSIS — D071 Carcinoma in situ of vulva: Secondary | ICD-10-CM | POA: Diagnosis not present

## 2017-02-28 DIAGNOSIS — Z77098 Contact with and (suspected) exposure to other hazardous, chiefly nonmedicinal, chemicals: Secondary | ICD-10-CM | POA: Diagnosis not present

## 2017-03-05 DIAGNOSIS — Z77098 Contact with and (suspected) exposure to other hazardous, chiefly nonmedicinal, chemicals: Secondary | ICD-10-CM | POA: Diagnosis not present

## 2017-03-06 ENCOUNTER — Encounter: Payer: Self-pay | Admitting: Internal Medicine

## 2017-03-07 DIAGNOSIS — R600 Localized edema: Secondary | ICD-10-CM | POA: Diagnosis not present

## 2017-03-07 DIAGNOSIS — M170 Bilateral primary osteoarthritis of knee: Secondary | ICD-10-CM | POA: Diagnosis not present

## 2017-03-07 DIAGNOSIS — R262 Difficulty in walking, not elsewhere classified: Secondary | ICD-10-CM | POA: Diagnosis not present

## 2017-03-08 DIAGNOSIS — I10 Essential (primary) hypertension: Secondary | ICD-10-CM | POA: Diagnosis not present

## 2017-03-12 DIAGNOSIS — R600 Localized edema: Secondary | ICD-10-CM | POA: Diagnosis not present

## 2017-03-12 DIAGNOSIS — M170 Bilateral primary osteoarthritis of knee: Secondary | ICD-10-CM | POA: Diagnosis not present

## 2017-03-12 DIAGNOSIS — R262 Difficulty in walking, not elsewhere classified: Secondary | ICD-10-CM | POA: Diagnosis not present

## 2017-03-22 ENCOUNTER — Encounter: Payer: Self-pay | Admitting: Internal Medicine

## 2017-03-27 DIAGNOSIS — R600 Localized edema: Secondary | ICD-10-CM | POA: Diagnosis not present

## 2017-03-27 DIAGNOSIS — R262 Difficulty in walking, not elsewhere classified: Secondary | ICD-10-CM | POA: Diagnosis not present

## 2017-03-27 DIAGNOSIS — M170 Bilateral primary osteoarthritis of knee: Secondary | ICD-10-CM | POA: Diagnosis not present

## 2017-04-02 DIAGNOSIS — R600 Localized edema: Secondary | ICD-10-CM | POA: Diagnosis not present

## 2017-04-02 DIAGNOSIS — M170 Bilateral primary osteoarthritis of knee: Secondary | ICD-10-CM | POA: Diagnosis not present

## 2017-04-02 DIAGNOSIS — R262 Difficulty in walking, not elsewhere classified: Secondary | ICD-10-CM | POA: Diagnosis not present

## 2017-04-04 DIAGNOSIS — R262 Difficulty in walking, not elsewhere classified: Secondary | ICD-10-CM | POA: Diagnosis not present

## 2017-04-04 DIAGNOSIS — M170 Bilateral primary osteoarthritis of knee: Secondary | ICD-10-CM | POA: Diagnosis not present

## 2017-04-04 DIAGNOSIS — R600 Localized edema: Secondary | ICD-10-CM | POA: Diagnosis not present

## 2017-04-10 DIAGNOSIS — R600 Localized edema: Secondary | ICD-10-CM | POA: Diagnosis not present

## 2017-04-10 DIAGNOSIS — R262 Difficulty in walking, not elsewhere classified: Secondary | ICD-10-CM | POA: Diagnosis not present

## 2017-04-10 DIAGNOSIS — M170 Bilateral primary osteoarthritis of knee: Secondary | ICD-10-CM | POA: Diagnosis not present

## 2017-04-12 DIAGNOSIS — M170 Bilateral primary osteoarthritis of knee: Secondary | ICD-10-CM | POA: Diagnosis not present

## 2017-04-12 DIAGNOSIS — R262 Difficulty in walking, not elsewhere classified: Secondary | ICD-10-CM | POA: Diagnosis not present

## 2017-04-12 DIAGNOSIS — R600 Localized edema: Secondary | ICD-10-CM | POA: Diagnosis not present

## 2017-04-18 DIAGNOSIS — M170 Bilateral primary osteoarthritis of knee: Secondary | ICD-10-CM | POA: Diagnosis not present

## 2017-04-18 DIAGNOSIS — R262 Difficulty in walking, not elsewhere classified: Secondary | ICD-10-CM | POA: Diagnosis not present

## 2017-04-18 DIAGNOSIS — R600 Localized edema: Secondary | ICD-10-CM | POA: Diagnosis not present

## 2017-05-07 ENCOUNTER — Encounter (INDEPENDENT_AMBULATORY_CARE_PROVIDER_SITE_OTHER): Payer: Medicare Other | Admitting: Ophthalmology

## 2017-05-07 DIAGNOSIS — H43813 Vitreous degeneration, bilateral: Secondary | ICD-10-CM | POA: Diagnosis not present

## 2017-05-07 DIAGNOSIS — H35033 Hypertensive retinopathy, bilateral: Secondary | ICD-10-CM

## 2017-05-07 DIAGNOSIS — H353122 Nonexudative age-related macular degeneration, left eye, intermediate dry stage: Secondary | ICD-10-CM | POA: Diagnosis not present

## 2017-05-07 DIAGNOSIS — I10 Essential (primary) hypertension: Secondary | ICD-10-CM | POA: Diagnosis not present

## 2017-05-07 DIAGNOSIS — H3509 Other intraretinal microvascular abnormalities: Secondary | ICD-10-CM

## 2017-05-14 ENCOUNTER — Ambulatory Visit (INDEPENDENT_AMBULATORY_CARE_PROVIDER_SITE_OTHER): Payer: Medicare Other | Admitting: Ophthalmology

## 2017-05-25 ENCOUNTER — Emergency Department (HOSPITAL_COMMUNITY)
Admission: EM | Admit: 2017-05-25 | Discharge: 2017-05-25 | Disposition: A | Discharge status: Home / Self Care | Payer: Medicare Other | Attending: Emergency Medicine | Admitting: Emergency Medicine

## 2017-05-25 ENCOUNTER — Encounter (HOSPITAL_COMMUNITY): Payer: Self-pay | Admitting: Emergency Medicine

## 2017-05-25 DIAGNOSIS — J45909 Unspecified asthma, uncomplicated: Secondary | ICD-10-CM | POA: Diagnosis not present

## 2017-05-25 DIAGNOSIS — I10 Essential (primary) hypertension: Secondary | ICD-10-CM | POA: Insufficient documentation

## 2017-05-25 DIAGNOSIS — Z48814 Encounter for surgical aftercare following surgery on the teeth or oral cavity: Secondary | ICD-10-CM | POA: Diagnosis not present

## 2017-05-25 DIAGNOSIS — Z79899 Other long term (current) drug therapy: Secondary | ICD-10-CM | POA: Insufficient documentation

## 2017-05-25 DIAGNOSIS — K068 Other specified disorders of gingiva and edentulous alveolar ridge: Secondary | ICD-10-CM

## 2017-05-25 DIAGNOSIS — Z87891 Personal history of nicotine dependence: Secondary | ICD-10-CM | POA: Insufficient documentation

## 2017-05-25 DIAGNOSIS — S60512A Abrasion of left hand, initial encounter: Secondary | ICD-10-CM | POA: Diagnosis not present

## 2017-05-25 DIAGNOSIS — K055 Other periodontal diseases: Secondary | ICD-10-CM | POA: Diagnosis not present

## 2017-05-25 NOTE — ED Provider Notes (Signed)
[facility] EMERGENCY DEPARTMENT Provider Note   CSN: [id number] Arrival date & time: 05/25/17  2130     History   Chief Complaint Chief Complaint  Patient presents with  . Dental Pain    HPI Bridget Cortez is a 72 y.o. female who presents for evaluation of bleeding from her gums.  The patient states that she had gum surgery 2 weeks ago.  She had her stitches removed yesterday.  Today she decided to eat a cheeseburger but kept it to the left side of her mouth.  She noticed afterward that she began having bleeding and it frightened her badly.  She went to an urgent care where they packed her mouth with gauze.  Patient was not satisfied with this and came to the emergency department.  She had spoken with her dentist who states that she did not need to be here however she was already waiting.  She has no active bleeding at this time.  She is not taking any blood thinners.  She denies any significant pain, problems with swallowing, changes in voice.  HPI  Past Medical History:  Diagnosis Date  . Abnormal Pap smear 2006   vaginal medicine according to patient  . Asthma   . 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 Breast cancer ([place]) 2007   Left  . Chiari malformation   . Diverticulosis   . GERD (gastroesophageal reflux disease)   . H/O osteopenia    08/2006  . H/O varicella   . Heart murmur   . Hypertension   . Monilial vaginitis 07/2007  . Multiple allergies   . Ovarian cyst   . Personal history of colonic adenomas 03/13/2012  . Personal history of radiation therapy 1990  . Pneumonia 07/2014  . 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 Yeast infection     Patient Active Problem List   Diagnosis Date Noted  . CAP (community acquired pneumonia) 08/06/2014  . Hyponatremia 08/06/2014  . Asthma 08/06/2014  . Anxiety about health 08/06/2014  . Hypertension 08/06/2014  . Personal history of colonic adenomas 03/20/2012  . Chiari malformation   . Heart  murmur   . H/O varicella   . Yeast infection   . Ovarian cyst   . Cancer ([place])   . H/O osteopenia   . Abnormal Pap smear   . Vulvar intraepithelial neoplasia III (VIN III) 04/12/2009  . VIN III (vulvar intraepithelial neoplasia III) 03/23/2009  . Monilial vaginitis 07/24/2007  . Atrophy of vagina 06/23/2005    Past Surgical History:  Procedure Laterality Date  . ABDOMINAL HYSTERECTOMY    . ABDOMINAL SURGERY    . BREAST LUMPECTOMY Left 1990  . BREAST SURGERY     Lumpectomy, left breast  . COLONOSCOPY W/ BIOPSIES    . HERNIA REPAIR    . KNEE SURGERY     right     OB History    Gravida  0   Para  0   Term      Preterm  0   AB      Living  1     SAB      TAB      Ectopic      Multiple      Live Births           Obstetric Comments  PT HAS A ADOPTED CHILD GIRL         Home Medications    Prior to Admission medications  Medication Sig Start Date End Date Taking? Authorizing Provider  acetaminophen (TYLENOL) 325 MG tablet Take 2 tablets (650 mg total) by mouth every 6 (six) hours as needed for mild pain, fever or headache (or Fever >/= 101). 08/11/14   Hongalgi, Anand D, MD  albuterol (PROVENTIL) (2.5 MG/3ML) 0.083% nebulizer solution Take 2.5 mg by nebulization every 6 (six) hours as needed. For shortness of breath    [provider]  amLODipine (NORVASC) 10 MG tablet Take 10 mg by mouth every evening.     [provider]  Calcium Carbonate-Vitamin D (CALTRATE 600+D PO) Take 1 tablet by mouth daily.    [provider]  celecoxib (CELEBREX) 200 MG capsule Take 200 mg by mouth daily as needed for moderate pain.     [provider]  cetirizine (ZYRTEC) 10 MG tablet Take 10 mg by mouth daily.    [provider]  esomeprazole (NEXIUM) 40 MG capsule Take 40 mg by mouth daily before breakfast.    [provider]  fluticasone (FLONASE) 50 MCG/ACT nasal spray Place 1 spray into the nose at bedtime.  03/16/12    [provider]  Fluticasone-Salmeterol (ADVAIR) 250-50 MCG/DOSE AEPB Inhale 1 puff into the lungs every 12 (twelve) hours.    [provider]  losartan (COZAAR) 50 MG tablet Take 50 mg by mouth daily.    [provider]  menthol-cetylpyridinium (CEPACOL) 3 MG lozenge Take 1 lozenge (3 mg total) by mouth as needed for sore throat. 08/11/14   Hongalgi, Lenis Dickinson, MD  Multiple Vitamin (MULTIVITAMIN) tablet Take 1 tablet by mouth daily.    [provider]  nystatin (MYCOSTATIN) 100000 UNIT/ML suspension Take 5 mLs by mouth 4 (four) times daily.    [provider]  nystatin (MYCOSTATIN) 100000 UNIT/ML suspension Use as directed 5 mLs (500,000 Units total) in the mouth or throat 4 (four) times daily. 01/29/17   Palumbo, April, MD  polyethylene glycol (MIRALAX / GLYCOLAX) packet Take 17 g by mouth daily as needed for mild constipation or moderate constipation.    [provider]  potassium chloride SA (K-DUR,KLOR-CON) 20 MEQ tablet Take 2 tablets (40 mEq total) by mouth daily. Patient taking differently: Take 20 mEq by mouth 3 (three) times daily.  08/11/14   Hongalgi, Lenis Dickinson, MD  simvastatin (ZOCOR) 20 MG tablet Take 20 mg by mouth every evening.    [provider]  triamcinolone (KENALOG) 0.1 % paste Use as directed 1 application in the mouth or throat 2 (two) times daily.    [provider]  vitamin C (ASCORBIC ACID) 500 MG tablet Take 500 mg by mouth daily.    [provider]    Family History Family History  Problem Relation Age of Onset  . Cancer Mother   . Heart failure Other   . Colon cancer Neg Hx   . Esophageal cancer Neg Hx   . Rectal cancer Neg Hx   . Stomach cancer Neg Hx     Social History Social History   Tobacco Use  . Smoking status: Former Smoker    Last attempt to quit: 01/23/1978    Years since quitting: 39.3  . Smokeless tobacco: Never Used  Substance Use Topics  . Alcohol use: No  . Drug use:  No     Allergies   Ephedrine   Review of Systems Review of Systems  Constitutional: Negative for fever.  HENT: Positive for dental problem. Negative for trouble swallowing and voice change.  Hematological: Does not bruise/bleed easily.     Physical Exam Updated Vital Signs BP (!) 157/93 (BP Location: Right Arm)   Pulse 97   Temp 98.4 F (36.9 C) (Oral)   Resp 18   Ht 5\' 7"  (1.702 m)   Wt 111.1 kg (245 lb)   SpO2 100%   BMI 38.37 kg/m   Physical Exam  Constitutional: She is oriented to person, place, and time. She appears well-developed and well-nourished. No distress.  HENT:  Head: Normocephalic and atraumatic.  Mouth/Throat:    Eyes: Conjunctivae are normal. No scleral icterus.  Neck: Normal range of motion.  Cardiovascular: Normal rate, regular rhythm and normal heart sounds. Exam reveals no gallop and no friction rub.  No murmur heard. Pulmonary/Chest: Effort normal and breath sounds normal. No respiratory distress.  Abdominal: Soft. Bowel sounds are normal. She exhibits no distension and no mass. There is no tenderness. There is no guarding.  Neurological: She is alert and oriented to person, place, and time.  Skin: Skin is warm and dry. She is not diaphoretic.  Psychiatric: Her behavior is normal.  Nursing note and vitals reviewed.    ED Treatments / Results  Labs (all labs ordered are listed, but only abnormal results are displayed) Labs Reviewed - No data to display  EKG None  Radiology No results found.  Procedures Procedures (including critical care time)  Medications Ordered in ED Medications - No data to display   Initial Impression / Assessment and Plan / ED Course  I have reviewed the triage vital signs and the nursing notes.  Pertinent labs & imaging results that were available during my care of the patient were reviewed by me and considered in my medical decision making (see chart for details).     Patient reassured that her  gums feel to be well here healing.  She does not need any intervention at this time as her bleeding has resolved.  I discussed home care including direct pressure, ice.  She should maintain a very soft diet until she is able to follow-up with her dentist on Monday.  She appears appropriate for discharge at this time.  I discussed return precautions with the patient.  Final Clinical Impressions(s) / ED Diagnoses   Final diagnoses:  Encounter for surgical aftercare following surgery on the teeth or oral cavity  Gums, bleeding    ED Discharge Orders    None       Margarita Mail, [name] 05/25/17 2255    Virgel Manifold, MD 05/29/17 1043

## 2017-05-25 NOTE — ED Triage Notes (Signed)
Pt reports R upper dental bleeding since yesterday. Pt states she had "gum surgery" on 05/17/17, had the stitches removed yesterday. Pt thinks he took out her stiches too early... No active bleeding noted. Pt highly anxious in triage.

## 2017-05-25 NOTE — ED Notes (Signed)
One touch pt, see provider's assessment 

## 2017-05-25 NOTE — ED Notes (Signed)
ED Provider at bedside. 

## 2017-05-25 NOTE — Discharge Instructions (Signed)
If you begin to have bleeding again. Put Gauze tightly up against the area of bleeding. Apply direct pressure for 30 minutes and DO NOT STOP.  Expect some mild bleeding in your saliva and on the gauze.  Unless you are bleeding heavily from the site it is okay to have a small amount of oozing.  Follow closely with your dentist.  Return to the emergency department if you have significant bleeding that will not stop, dizziness, lightheadedness or you pass out.

## 2017-05-29 ENCOUNTER — Ambulatory Visit (AMBULATORY_SURGERY_CENTER): Payer: Self-pay | Admitting: *Deleted

## 2017-05-29 ENCOUNTER — Other Ambulatory Visit: Payer: Self-pay

## 2017-05-29 VITALS — Ht 68.5 in | Wt 248.0 lb

## 2017-05-29 DIAGNOSIS — Z86010 Personal history of colonic polyps: Secondary | ICD-10-CM

## 2017-05-29 NOTE — Progress Notes (Signed)
No egg or soy allergy known to patient  Pt. Had breast cancer and was told not to drink soy milk due to the cancer. No issues with past sedation with any surgeries  or procedures, no intubation problems  Woke up slightly during a breast procedure/surgery in 1980 No diet pills per patient No home 02 use per patient  No blood thinners per patient  Pt denies issues with constipation  No A fib or A flutter  EMMI video sent to pt's e mail pt. Does not have email

## 2017-06-12 ENCOUNTER — Other Ambulatory Visit: Payer: Self-pay

## 2017-06-12 ENCOUNTER — Encounter: Payer: Self-pay | Admitting: Internal Medicine

## 2017-06-12 ENCOUNTER — Ambulatory Visit (AMBULATORY_SURGERY_CENTER): Payer: Medicare Other | Admitting: Internal Medicine

## 2017-06-12 VITALS — BP 150/66 | HR 73 | Temp 98.6°F | Resp 17 | Ht 68.5 in | Wt 248.0 lb

## 2017-06-12 DIAGNOSIS — Z86010 Personal history of colonic polyps: Secondary | ICD-10-CM | POA: Diagnosis not present

## 2017-06-12 MED ORDER — SODIUM CHLORIDE 0.9 % IV SOLN: 500.0000 mL | Freq: Once | INTRAVENOUS | Status: DC

## 2017-06-12 NOTE — Progress Notes (Signed)
Spontaneous respirations throughout. VSS. Resting comfortably. To PACU on room air. Report to  RN. 

## 2017-06-12 NOTE — Op Note (Addendum)
[name] Procedure Date: 06/12/2017 11:37 AM MRN: [id number] Endoscopist: Gatha Mayer , MD Age: 72 Referring MD:  Date of Birth: 01-Jul-1945 Gender: Female Account #: [id number] Procedure:                Colonoscopy Indications:              Surveillance: Personal history of adenomatous                            polyps on last colonoscopy 5 years ago Medicines:                Propofol per Anesthesia, Monitored Anesthesia Care Procedure:                Pre-Anesthesia Assessment:                           - Prior to the procedure, a History and Physical                            was performed, and patient medications and                            allergies were reviewed. The patient's tolerance of                            previous anesthesia was also reviewed. The risks                            and benefits of the procedure and the sedation                            options and risks were discussed with the patient.                            All questions were answered, and informed consent                            was obtained. Prior Anticoagulants: The patient has                            taken no previous anticoagulant or antiplatelet                            agents. ASA Grade Assessment: III - A patient with                            severe systemic disease. After reviewing the risks                            and benefits, the patient was deemed in                            satisfactory condition to undergo the procedure.  After obtaining informed consent, the colonoscope                            was passed under direct vision. Throughout the                            procedure, the patient's blood pressure, pulse, and                            oxygen saturations were monitored continuously. The                            Colonoscope was introduced through the anus and       advanced to the the cecum, identified by                            appendiceal orifice and ileocecal valve. The                            colonoscopy was performed without difficulty. The                            patient tolerated the procedure well. The quality                            of the bowel preparation was adequate. The bowel                            preparation used was Miralax. The ileocecal valve,                            appendiceal orifice, and rectum were photographed. Scope In: 11:48:50 AM Scope Out: 07:62:26 PM Scope Withdrawal Time: 0 hours 13 minutes 19 seconds  Total Procedure Duration: 0 hours 19 minutes 29 seconds  Findings:                 The perianal and digital rectal examinations were                            normal.                           Multiple diverticula were found in the sigmoid                            colon.                           The exam was otherwise without abnormality on                            direct and retroflexion views. Complications:            No immediate complications. Estimated Blood Loss:     Estimated blood loss: none. Impression:               -  Diverticulosis in the sigmoid colon.                           - The examination was otherwise normal on direct                            and retroflexion views.                           - No specimens collected.                           - Personal history of colonic polyps. 2-3                            diminutive adenomas 2014 Recommendation:           - Patient has a contact number available for                            emergencies. The signs and symptoms of potential                            delayed complications were discussed with the                            patient. Return to normal activities tomorrow.                            Written discharge instructions were provided to the                            patient.                           -  Resume previous diet.                           - Continue present medications.                           - No repeat colonoscopy due to age and the absence                            of colonic polyps.                           - trial of IB Gard before supper for abdominal                            cramps/gas pains Gatha Mayer, MD 06/12/2017 12:14:02 PM This report has been signed electronically.

## 2017-06-12 NOTE — Patient Instructions (Addendum)
No polyps today!  You do have diverticulosis as before- thickened muscle rings and pouches in the colon wall. Please read the handout about this condition.  Try taking 2 IB Gard before supper to help your cramps  Come back and see me if still having problems  I appreciate the opportunity to care for you. Gatha Mayer, MD, FACG    YOU HAD AN ENDOSCOPIC PROCEDURE TODAY AT [facility]:   Refer to the procedure report that was given to you for any specific questions about what was found during the examination.  If the procedure report does not answer your questions, please call your gastroenterologist to clarify.  If you requested that your care partner not be given the details of your procedure findings, then the procedure report has been included in a sealed envelope for you to review at your convenience later.  YOU SHOULD EXPECT: Some feelings of bloating in the abdomen. Passage of more gas than usual.  Walking can help get rid of the air that was put into your GI tract during the procedure and reduce the bloating. If you had a lower endoscopy (such as a colonoscopy or flexible sigmoidoscopy) you may notice spotting of blood in your stool or on the toilet paper. If you underwent a bowel prep for your procedure, you may not have a normal bowel movement for a few days.  Please Note:  You might notice some irritation and congestion in your nose or some drainage.  This is from the oxygen used during your procedure.  There is no need for concern and it should clear up in a day or so.  SYMPTOMS TO REPORT IMMEDIATELY:   Following lower endoscopy (colonoscopy or flexible sigmoidoscopy):  Excessive amounts of blood in the stool  Significant tenderness or worsening of abdominal pains  Swelling of the abdomen that is new, acute  Fever of 100F or higher   For urgent or emergent issues, a gastroenterologist can be reached at any hour by calling [phone].   DIET:   We do recommend a small meal at first, but then you may proceed to your regular diet.  Drink plenty of fluids but you should avoid alcoholic beverages for 24 hours.  ACTIVITY:  You should plan to take it easy for the rest of today and you should NOT DRIVE or use heavy machinery until tomorrow (because of the sedation medicines used during the test).    FOLLOW UP: Our staff will call the number listed on your records the next business day following your procedure to check on you and address any questions or concerns that you may have regarding the information given to you following your procedure. If we do not reach you, we will leave a message.  However, if you are feeling well and you are not experiencing any problems, there is no need to return our call.  We will assume that you have returned to your regular daily activities without incident.  If any biopsies were taken you will be contacted by phone or by letter within the next 1-3 weeks.  Please call us at [phone] if you have not heard about the biopsies in 3 weeks.    SIGNATURES/CONFIDENTIALITY: You and/or your care partner have signed paperwork which will be entered into your electronic medical record.  These signatures attest to the fact that that the information above on your After Visit Summary has been reviewed and is understood.  Full responsibility of the confidentiality  of this discharge information lies with you and/or your care-partner.   Handout was given to your care partner on diverticulosis. You may resume your medications today. Please call if any questions or concerns.

## 2017-06-13 ENCOUNTER — Telehealth: Payer: Self-pay

## 2017-06-13 NOTE — Telephone Encounter (Signed)
  Follow up Call-  Call back number 06/12/2017  Post procedure Call Back phone  # [phone]  Permission to leave phone message Yes  Some recent data might be hidden     Patient questions:  Do you have a fever, pain , or abdominal swelling? No. Pain Score  0 *  Have you tolerated food without any problems? Yes.    Have you been able to return to your normal activities? Yes.    Do you have any questions about your discharge instructions: Diet   Yes.  wanted to discuss what she can eat.  Questioned if oatmeal and applesauce OK.  RN okayed and suggested that if she feels well she could have whatever she wants. Medications  No. Follow up visit  No.  Do you have questions or concerns about your Care? No.  Actions: * If pain score is 4 or above: No action needed, pain <4.

## 2017-08-03 ENCOUNTER — Encounter (HOSPITAL_COMMUNITY): Payer: Self-pay | Admitting: *Deleted

## 2017-08-03 ENCOUNTER — Inpatient Hospital Stay (HOSPITAL_COMMUNITY)
Admission: EM | Admit: 2017-08-03 | Discharge: 2017-08-04 | DRG: 309 | Disposition: A | Discharge status: Home / Self Care | Payer: Medicare Other | Attending: Internal Medicine | Admitting: Internal Medicine

## 2017-08-03 DIAGNOSIS — I313 Pericardial effusion (noninflammatory): Secondary | ICD-10-CM | POA: Diagnosis present

## 2017-08-03 DIAGNOSIS — F419 Anxiety disorder, unspecified: Secondary | ICD-10-CM | POA: Diagnosis present

## 2017-08-03 DIAGNOSIS — J45909 Unspecified asthma, uncomplicated: Secondary | ICD-10-CM | POA: Diagnosis present

## 2017-08-03 DIAGNOSIS — R000 Tachycardia, unspecified: Secondary | ICD-10-CM

## 2017-08-03 DIAGNOSIS — Z87891 Personal history of nicotine dependence: Secondary | ICD-10-CM

## 2017-08-03 DIAGNOSIS — I361 Nonrheumatic tricuspid (valve) insufficiency: Secondary | ICD-10-CM | POA: Diagnosis not present

## 2017-08-03 DIAGNOSIS — Z90710 Acquired absence of both cervix and uterus: Secondary | ICD-10-CM

## 2017-08-03 DIAGNOSIS — Z888 Allergy status to other drugs, medicaments and biological substances status: Secondary | ICD-10-CM | POA: Diagnosis not present

## 2017-08-03 DIAGNOSIS — I499 Cardiac arrhythmia, unspecified: Secondary | ICD-10-CM | POA: Diagnosis not present

## 2017-08-03 DIAGNOSIS — F329 Major depressive disorder, single episode, unspecified: Secondary | ICD-10-CM | POA: Diagnosis present

## 2017-08-03 DIAGNOSIS — E785 Hyperlipidemia, unspecified: Secondary | ICD-10-CM | POA: Diagnosis present

## 2017-08-03 DIAGNOSIS — Z79899 Other long term (current) drug therapy: Secondary | ICD-10-CM

## 2017-08-03 DIAGNOSIS — Z6837 Body mass index (BMI) 37.0-37.9, adult: Secondary | ICD-10-CM

## 2017-08-03 DIAGNOSIS — K219 Gastro-esophageal reflux disease without esophagitis: Secondary | ICD-10-CM | POA: Diagnosis present

## 2017-08-03 DIAGNOSIS — I519 Heart disease, unspecified: Secondary | ICD-10-CM | POA: Diagnosis not present

## 2017-08-03 DIAGNOSIS — I472 Ventricular tachycardia: Secondary | ICD-10-CM | POA: Diagnosis not present

## 2017-08-03 DIAGNOSIS — Z7951 Long term (current) use of inhaled steroids: Secondary | ICD-10-CM

## 2017-08-03 DIAGNOSIS — E669 Obesity, unspecified: Secondary | ICD-10-CM | POA: Diagnosis present

## 2017-08-03 DIAGNOSIS — I471 Supraventricular tachycardia, unspecified: Secondary | ICD-10-CM | POA: Diagnosis present

## 2017-08-03 DIAGNOSIS — I4891 Unspecified atrial fibrillation: Secondary | ICD-10-CM | POA: Diagnosis not present

## 2017-08-03 DIAGNOSIS — W19XXXA Unspecified fall, initial encounter: Secondary | ICD-10-CM | POA: Diagnosis not present

## 2017-08-03 DIAGNOSIS — Z853 Personal history of malignant neoplasm of breast: Secondary | ICD-10-CM

## 2017-08-03 DIAGNOSIS — I10 Essential (primary) hypertension: Secondary | ICD-10-CM | POA: Diagnosis present

## 2017-08-03 DIAGNOSIS — F418 Other specified anxiety disorders: Secondary | ICD-10-CM | POA: Diagnosis not present

## 2017-08-03 DIAGNOSIS — Z6839 Body mass index (BMI) 39.0-39.9, adult: Secondary | ICD-10-CM | POA: Diagnosis not present

## 2017-08-03 DIAGNOSIS — Z8249 Family history of ischemic heart disease and other diseases of the circulatory system: Secondary | ICD-10-CM

## 2017-08-03 LAB — CBC WITH DIFFERENTIAL/PLATELET
Abs Immature Granulocytes: 0 10*3/uL (ref 0.0–0.1)
Basophils Absolute: 0 10*3/uL (ref 0.0–0.1)
Basophils Relative: 1 %
EOS ABS: 0.1 10*3/uL (ref 0.0–0.7)
EOS PCT: 2 %
HEMATOCRIT: 44.2 % (ref 36.0–46.0)
Hemoglobin: 14.1 g/dL (ref 12.0–15.0)
IMMATURE GRANULOCYTES: 0 %
LYMPHS ABS: 1.4 10*3/uL (ref 0.7–4.0)
Lymphocytes Relative: 30 %
MCH: 28.3 pg (ref 26.0–34.0)
MCHC: 31.9 g/dL (ref 30.0–36.0)
MCV: 88.8 fL (ref 78.0–100.0)
MONOS PCT: 9 %
Monocytes Absolute: 0.4 10*3/uL (ref 0.1–1.0)
NEUTROS PCT: 58 %
Neutro Abs: 2.5 10*3/uL (ref 1.7–7.7)
Platelets: 300 10*3/uL (ref 150–400)
RBC: 4.98 MIL/uL (ref 3.87–5.11)
RDW: 15.3 % (ref 11.5–15.5)
WBC: 4.5 10*3/uL (ref 4.0–10.5)

## 2017-08-03 LAB — BASIC METABOLIC PANEL
Anion gap: 11 (ref 5–15)
BUN: 10 mg/dL (ref 8–23)
CHLORIDE: 106 mmol/L (ref 98–111)
CO2: 24 mmol/L (ref 22–32)
CREATININE: 0.81 mg/dL (ref 0.44–1.00)
Calcium: 9.2 mg/dL (ref 8.9–10.3)
GFR calc Af Amer: >60 mL/min (ref >60)
GFR calc non Af Amer: >60 mL/min (ref >60)
Glucose, Bld: 99 mg/dL (ref 70–99)
Potassium: 4.1 mmol/L (ref 3.5–5.1)
Sodium: 141 mmol/L (ref 135–145)

## 2017-08-03 LAB — I-STAT TROPONIN, ED: Troponin i, poc: 0 ng/mL (ref 0.00–0.08)

## 2017-08-03 LAB — MAGNESIUM: Magnesium: 2 mg/dL (ref 1.7–2.4)

## 2017-08-03 LAB — TROPONIN I: Troponin I: <0.03 ng/mL (ref <0.03)

## 2017-08-03 MED ORDER — SIMVASTATIN 20 MG PO TABS
20.0000 mg | ORAL_TABLET | Freq: Every evening | ORAL | Status: DC
  Administered 2017-08-03: 20 mg via ORAL
  Filled 2017-08-03 (×2): qty 1

## 2017-08-03 MED ORDER — POTASSIUM CHLORIDE CRYS ER 20 MEQ PO TBCR
20.0000 meq | EXTENDED_RELEASE_TABLET | Freq: Two times a day (BID) | ORAL | Status: DC
  Administered 2017-08-03 – 2017-08-04 (×2): 20 meq via ORAL
  Filled 2017-08-03 (×2): qty 1

## 2017-08-03 MED ORDER — ALBUTEROL SULFATE (2.5 MG/3ML) 0.083% IN NEBU: 2.5000 mg | INHALATION_SOLUTION | Freq: Four times a day (QID) | RESPIRATORY_TRACT | Status: DC | PRN

## 2017-08-03 MED ORDER — FLUTICASONE PROPIONATE 50 MCG/ACT NA SUSP
1.0000 | Freq: Every day | NASAL | Status: DC
  Administered 2017-08-04: 1 via NASAL
  Filled 2017-08-03: qty 16

## 2017-08-03 MED ORDER — ENOXAPARIN SODIUM 40 MG/0.4ML ~~LOC~~ SOLN
40.0000 mg | SUBCUTANEOUS | Status: DC
  Administered 2017-08-03: 40 mg via SUBCUTANEOUS
  Filled 2017-08-03 (×2): qty 0.4

## 2017-08-03 MED ORDER — ACETAMINOPHEN 325 MG PO TABS
650.0000 mg | ORAL_TABLET | Freq: Four times a day (QID) | ORAL | Status: DC | PRN
  Administered 2017-08-04: 650 mg via ORAL
  Filled 2017-08-03: qty 2

## 2017-08-03 MED ORDER — METOPROLOL TARTRATE 50 MG PO TABS
50.0000 mg | ORAL_TABLET | Freq: Two times a day (BID) | ORAL | Status: DC
  Administered 2017-08-03 – 2017-08-04 (×2): 50 mg via ORAL
  Filled 2017-08-03 (×2): qty 1

## 2017-08-03 MED ORDER — ADULT MULTIVITAMIN W/MINERALS CH
1.0000 | ORAL_TABLET | Freq: Every day | ORAL | Status: DC
  Administered 2017-08-04: 1 via ORAL
  Filled 2017-08-03 (×2): qty 1

## 2017-08-03 MED ORDER — CELECOXIB 200 MG PO CAPS
200.0000 mg | ORAL_CAPSULE | Freq: Every day | ORAL | Status: DC | PRN
  Filled 2017-08-03 (×2): qty 1

## 2017-08-03 MED ORDER — LORAZEPAM 0.5 MG PO TABS
0.2500 mg | ORAL_TABLET | Freq: Two times a day (BID) | ORAL | Status: DC | PRN
  Administered 2017-08-04: 0.25 mg via ORAL
  Filled 2017-08-03: qty 1

## 2017-08-03 MED ORDER — LORATADINE 10 MG PO TABS
10.0000 mg | ORAL_TABLET | Freq: Every day | ORAL | Status: DC
  Administered 2017-08-04: 10 mg via ORAL
  Filled 2017-08-03: qty 1

## 2017-08-03 MED ORDER — VITAMIN C 500 MG PO TABS
500.0000 mg | ORAL_TABLET | Freq: Every day | ORAL | Status: DC
  Administered 2017-08-04: 500 mg via ORAL
  Filled 2017-08-03: qty 1

## 2017-08-03 MED ORDER — TRAZODONE HCL 50 MG PO TABS
25.0000 mg | ORAL_TABLET | Freq: Every evening | ORAL | Status: DC | PRN
  Administered 2017-08-04: 25 mg via ORAL
  Filled 2017-08-03: qty 1

## 2017-08-03 MED ORDER — SIMETHICONE 80 MG PO CHEW
80.0000 mg | CHEWABLE_TABLET | Freq: Four times a day (QID) | ORAL | Status: DC | PRN
  Administered 2017-08-04 (×2): 80 mg via ORAL
  Filled 2017-08-03 (×2): qty 1

## 2017-08-03 MED ORDER — PANTOPRAZOLE SODIUM 40 MG PO TBEC
40.0000 mg | DELAYED_RELEASE_TABLET | Freq: Every day | ORAL | Status: DC
Start: 2017-08-03 — End: 2017-08-04
  Administered 2017-08-04: 40 mg via ORAL
  Filled 2017-08-03: qty 1

## 2017-08-03 MED ORDER — ONDANSETRON HCL 4 MG/2ML IJ SOLN: 4.0000 mg | Freq: Four times a day (QID) | INTRAMUSCULAR | Status: DC | PRN

## 2017-08-03 MED ORDER — LOSARTAN POTASSIUM 50 MG PO TABS
100.0000 mg | ORAL_TABLET | Freq: Every day | ORAL | Status: DC
  Administered 2017-08-04: 100 mg via ORAL
  Filled 2017-08-03: qty 2

## 2017-08-03 MED ORDER — AMIODARONE IV BOLUS ONLY 150 MG/100ML: 150.0000 mg | Freq: Once | INTRAVENOUS | Status: DC

## 2017-08-03 MED ORDER — MOMETASONE FURO-FORMOTEROL FUM 200-5 MCG/ACT IN AERO
2.0000 | INHALATION_SPRAY | Freq: Two times a day (BID) | RESPIRATORY_TRACT | Status: DC
  Administered 2017-08-03 – 2017-08-04 (×2): 2 via RESPIRATORY_TRACT
  Filled 2017-08-03: qty 8.8

## 2017-08-03 MED ORDER — ONDANSETRON HCL 4 MG PO TABS: 4.0000 mg | ORAL_TABLET | Freq: Four times a day (QID) | ORAL | Status: DC | PRN

## 2017-08-03 MED ORDER — LORAZEPAM 2 MG/ML IJ SOLN
0.5000 mg | Freq: Once | INTRAMUSCULAR | Status: AC
  Administered 2017-08-03: 0.5 mg via INTRAVENOUS
  Filled 2017-08-03: qty 1

## 2017-08-03 NOTE — H&P (Addendum)
Cardiology Admission History and Physical:   Patient ID: Bridget Cortez; MRN: [id number]; DOB: 08/23/1945   Admission date: 08/03/2017  Primary Care Provider: [name], MD Primary Cardiologist: Lauree Chandler, MD  Primary Electrophysiologist:  New to Dr. Caryl Comes  Chief Complaint: Sent from PCP office for tachycardia  Patient Profile:   Bridget Cortez is a 72 y.o. female with a history of HTN, HLD, asthma, depression, anxiety, GERD, OA, remote breast cancer s/p lumpectomy and XRT, and retinal embolus, who presented from PCP office for tachycardia.  History of Present Illness:   Ms. Mcdiarmid was in her usual state of health today and went to her PCP office for a routine visit. She was noted to be tachycardic and was sent to the ED via EMS for further evaluation. On presentation here, her initial EKG reviewed with ED provider (althought not in the system), revealed a narrow complex sinus tachycardia. Shortly thereafter, she converted to a wide complex tachycardia c/f ventricular tachycardia. She converted spontaneously back to NSR at the time of this evaluation.   Throughout these episodes she has been asymptomatic. She denies CP, SOB, palpitations, feeling her heart race, dizziness, lightheadedness, or syncope. She denies prior cardiac history and has never been diagnosed with an arrhythmia in the past. She was quite anxious throughout this conversation and reports feeling worried about her health, family member's health, and caring for her grandchildren to name a few. She is not on any medications for her depression/anxiety.   She was seen by Dr. Julianne Handler 01/2015 for the evaluation of a recent diagnosis of retinal embolus. She had an echocardiogram prior to this visit with normal LV function and no valvular disease. Carotid dopplers revealed mild bilateral disease. Given benign findings and lack of cardiac complaints she was not recommended for any further cardiac work-up.    ED course: Intermittently tachycardic to the 140s, otherwise VSS. Labs notable for electrolytes wnl (K 4.1, Mg 2.0), Cr 0.81, CBC wnl, Trop 0.00. EKG with wide complex tachycardia, rate 149, concerning for ventricular tachycardia. Decision made to admit patient to EP service for ongoing work-up.      Past Medical History:  Diagnosis Date  . Abnormal Pap smear 2006   vaginal medicine according to patient  . Allergy   . Arthritis   . Asthma   . 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 Breast cancer ([place]) 1999   Left  . CAP (community acquired pneumonia) 08/06/2014  . Cataract    cataracts lt. eye    cataract removed.  . Chiari malformation   . Diverticulosis   . GERD (gastroesophageal reflux disease)   . H/O osteopenia    08/2006  . H/O varicella   . Heart murmur   . Hypertension   . Monilial vaginitis 07/2007  . Multiple allergies   . Ovarian cyst   . Personal history of colonic adenomas 03/13/2012  . Personal history of radiation therapy 1990  . Pneumonia 07/2014  . 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 Yeast infection     Past Surgical History:  Procedure Laterality Date  . ABDOMINAL HYSTERECTOMY    . ABDOMINAL SURGERY    . BREAST LUMPECTOMY Left 1990  . BREAST SURGERY     Lumpectomy, left breast  . CATARACT EXTRACTION Left 2018  . COLONOSCOPY    . COLONOSCOPY W/ BIOPSIES    . HERNIA REPAIR    . KNEE SURGERY     right  . lt. eye aneurysm surgery Left 2017  .  MOUTH SURGERY  2019   rt. side gum surgery  Lt. done 3 years ago     Medications Prior to Admission: Prior to Admission medications   Medication Sig Start Date End Date Taking? Authorizing Provider  acetaminophen (TYLENOL) 325 MG tablet Take 2 tablets (650 mg total) by mouth every 6 (six) hours as needed for mild pain, fever or headache (or Fever >/= 101). 08/11/14   Hongalgi, Anand D, MD  albuterol (PROVENTIL) (2.5 MG/3ML) 0.083% nebulizer solution Take 2.5 mg by nebulization every 6 (six) hours as  needed. For shortness of breath    [provider]  amLODipine (NORVASC) 10 MG tablet Take 10 mg by mouth every evening.     [provider]  Calcium Carbonate-Vitamin D (CALTRATE 600+D PO) Take 1 tablet by mouth daily.    [provider]  celecoxib (CELEBREX) 200 MG capsule Take 200 mg by mouth daily as needed for moderate pain.     [provider]  cetirizine (ZYRTEC) 10 MG tablet Take 10 mg by mouth daily.    [provider]  esomeprazole (NEXIUM) 40 MG capsule Take 40 mg by mouth daily before breakfast.    [provider]  fluticasone (FLONASE) 50 MCG/ACT nasal spray Place 1 spray into the nose at bedtime.  03/16/12   [provider]  Fluticasone-Salmeterol (ADVAIR) 250-50 MCG/DOSE AEPB Inhale 1 puff into the lungs every 12 (twelve) hours.    [provider]  losartan (COZAAR) 50 MG tablet Take 100 mg by mouth daily.     [provider]  menthol-cetylpyridinium (CEPACOL) 3 MG lozenge Take 1 lozenge (3 mg total) by mouth as needed for sore throat. Patient not taking: Reported on 05/29/2017 08/11/14   Modena Jansky, MD  Multiple Vitamin (MULTIVITAMIN) tablet Take 1 tablet by mouth daily.    [provider]  nystatin (MYCOSTATIN) 100000 UNIT/ML suspension Take 5 mLs by mouth 4 (four) times daily.    [provider]  nystatin (MYCOSTATIN) 100000 UNIT/ML suspension Use as directed 5 mLs (500,000 Units total) in the mouth or throat 4 (four) times daily. 01/29/17   Palumbo, April, MD  potassium chloride SA (K-DUR,KLOR-CON) 20 MEQ tablet Take 2 tablets (40 mEq total) by mouth daily. Patient taking differently: Take 20 mEq by mouth 3 (three) times daily.  08/11/14   Hongalgi, Lenis Dickinson, MD  simvastatin (ZOCOR) 20 MG tablet Take 20 mg by mouth every evening.    [provider]  triamcinolone (KENALOG) 0.1 % paste Use as directed 1 application in the mouth or throat 2 (two) times daily.    [provider]  vitamin C (ASCORBIC ACID) 500 MG tablet Take 500 mg by mouth daily.    [provider]     Allergies:    Allergies  Allergen Reactions  . Ephedrine Shortness Of Breath  . Cortisone Other (See Comments)    Severe muscle spasms    Social History:   Social History   Socioeconomic History  . Marital status: Married    Spouse name: Not on file  . Number of children: 1  . Years of education: Not on file  . Highest education level: Not on file  Occupational History  . Occupation: Retired Education officer, museum  Social Needs  . Financial resource strain: Not on file  . Food insecurity:    Worry: Not on file    Inability: Not on file  . Transportation needs:    Medical: Not on file  Non-medical: Not on file  Tobacco Use  . Smoking status: Former Smoker    Last attempt to quit: 01/23/1978    Years since quitting: 39.5  . Smokeless tobacco: Never Used  Substance and Sexual Activity  . Alcohol use: No  . Drug use: No  . Sexual activity: Yes    Birth control/protection: Surgical  Lifestyle  . Physical activity:    Days per week: Not on file    Minutes per session: Not on file  . Stress: Not on file  Relationships  . Social connections:    Talks on phone: Not on file    Gets together: Not on file    Attends religious service: Not on file    Active member of club or organization: Not on file    Attends meetings of clubs or organizations: Not on file    Relationship status: Not on file  . Intimate partner violence:    Fear of current or ex partner: Not on file    Emotionally abused: Not on file    Physically abused: Not on file    Forced sexual activity: Not on file  Other Topics Concern  . Not on file  Social History Narrative  . Not on file    Family History:   The patient's family history includes Cancer in her mother; Heart failure in her other. There is no history of Colon cancer, Esophageal cancer, Rectal cancer, Stomach cancer, or Colon polyps.     ROS:  Please see the history of present illness.  All other ROS reviewed and negative.     Physical Exam/Data:   Vitals:   08/03/17 1400 08/03/17 1415 08/03/17 1430 08/03/17 1445  BP:  125/74 116/77 107/74  Pulse: (!) 144 92 89 89  Resp: (!) 21 (!) 22 17 19   Temp:      TempSrc:      SpO2: 100% 96% 96% 99%   No intake or output data in the 24 hours ending 08/03/17 1639 There were no vitals filed for this visit. There is no height or weight on file to calculate BMI.  General:  Well nourished, well developed, obese AAF sitting upright in bed in no acute distress, although quite anxious HEENT: sclera anicteric Neck: no JVD Vascular: Distal pulses 2+ bilaterally  Cardiac:  normal S1, S2; RRR; no murmurs, gallops, or rubs appreciated Lungs:  clear to auscultation bilaterally, no wheezing, rhonchi or rales  Abd: soft, nontender, no hepatomegaly  Ext: no edema Musculoskeletal:  No deformities, BUE and BLE strength normal and equal Skin: warm and dry  Neuro:  CNs 2-12 intact, no focal abnormalities noted Psych:  Anxious, tearful at times  EKG:  wide complex tachycardia, rate 149, concerning for ventricular tachycardia. Broad Q Wave aVR and Apart fron lead V2 ( in 2 ICS) neg precordial concordance, with what appears to be retrograde P waves 1:1 until tachycardia break  The onset of tachycardia on the one event recorded is two PACs followed by a fused beat  Relevant CV Studies: Echocardiogram 08/2014: Study Conclusions  - Left ventricle: The cavity size was normal. There was mild concentric hypertrophy. Systolic function was vigorous. The estimated ejection fraction was in the range of 65% to 70%. Wall motion was normal; there were no regional wall motion abnormalities. Doppler parameters are consistent with abnormal left ventricular relaxation (grade 1 diastolic dysfunction).There was no evidence of elevated ventricular filling pressure by Doppler parameters. - Aortic valve:  Trileaflet; normal thickness leaflets. - Aortic root:  The aortic root was normal in size. - Mitral valve: Structurally normal valve. - Right atrium: The atrium was normal in size. - Tricuspid valve: There was mild regurgitation. - Pulmonary arteries: Systolic pressure was within the normal range. - Inferior vena cava: The vessel was normal in size. - Pericardium, extracardiac: A mild pericardial effusion was identified posterior to the heart. Features were not consistent with tamponade physiology.  Impressions:  - Mild posteriorly located pericardial effusion with no signs of tamponade. No pericardial cyst was seen. If clinically indicated, consider ordering a cardiac MRI.  Laboratory Data:  Chemistry Recent Labs  Lab 08/03/17 1407  NA 141  K 4.1  CL 106  CO2 24  GLUCOSE 99  BUN 10  CREATININE 0.81  CALCIUM 9.2  GFRNONAA >60  GFRAA >60  ANIONGAP 11    No results for input(s): PROT, ALBUMIN, AST, ALT, ALKPHOS, BILITOT in the last 168 hours. Hematology Recent Labs  Lab 08/03/17 1407  WBC 4.5  RBC 4.98  HGB 14.1  HCT 44.2  MCV 88.8  MCH 28.3  MCHC 31.9  RDW 15.3  PLT 300   Cardiac EnzymesNo results for input(s): TROPONINI in the last 168 hours.  Recent Labs  Lab 08/03/17 1416  TROPIPOC 0.00    BNPNo results for input(s): BNP, PROBNP in the last 168 hours.  DDimer No results for input(s): DDIMER in the last 168 hours.  Radiology/Studies:  No results found.  Assessment and Plan:   1. Wide complex tachycardia: patient presented from PCP office for the evaluation of tachycardia. EKG concerning for ventricular tachycardia, although not entirely clear what rhythm is at this point. No prior cardiac history or arrhythmia history. Last echo 2016 revealed normal EF without significant valvular abnormalities. She spontaneously converted to NSR at the time of this evaluation.  - Continue to monitor on telemetry - Will start metoprolol 50mg  BID - Echocardiogram  ordered - Ultimately may benefit from EP study on Monday to further investigate arrhythmia  2. HTN: BP stable if not on the low side - Will hold home amlodipine to allow titration of BBlocker - Continue home losartan  3. HLD: no recent lipids - Continue statin  4. Anxiety/Depression: appears to be fairly debilitating.  - Will order low dose ativan prn anxiety - Would likely benefit from psychiatry referral at discharge   Severity of Illness: The appropriate patient status for this patient is INPATIENT. Inpatient status is judged to be reasonable and necessary in order to provide the required intensity of service to ensure the patient's safety. The patient's presenting symptoms, physical exam findings, and initial radiographic and laboratory data in the context of their chronic comorbidities is felt to place them at high risk for further clinical deterioration. Furthermore, it is not anticipated that the patient will be medically stable for discharge from the hospital within 2 midnights of admission. The following factors support the patient status of inpatient.   " The patient's presenting symptoms include anxiety. " The worrisome physical exam findings include intermittent tachycardia. " The initial radiographic and laboratory data are worrisome because of EKG concerning for ventricular tachycardia. " The chronic co-morbidities include HTN, HLD.   * I certify that at the point of admission it is my clinical judgment that the patient will require inpatient hospital care spanning beyond 2 midnights from the point of admission due to high intensity of service, high risk for further deterioration and high frequency of surveillance required.*    For questions or updates, please contact  [facility] Please consult [email or web address] for contact info under Cardiology/STEMI.    Signed, Abigail Butts, [name]  08/03/2017 4:39 PM    The pts WCT confuses me.  Its intiation with PACs suggest SVT  as does its termination with what appears to be with loss of retrograde conduction.  This might suggest a slow-slow AVNRT.  The 12 lead suggests VT based on concordance and avR  Criteriathat it is VT   PRev eval of LV had not identified substrate for VT;  Will get echo and maybe she will have another spell and telemetry may be more clarifying   She might need EPS to clarify mechanism Will begin her on BB  Her anxiety is almost debilittating and will recommend at discharge specific followup for this.

## 2017-08-03 NOTE — ED Triage Notes (Addendum)
Pt here from MD's office where she was going for a regular check-up.  Her HR was 150's, bp 124/72, rr18, spo2 94.  PT denies sob, nausea, sob.

## 2017-08-03 NOTE — ED Provider Notes (Signed)
[facility] EMERGENCY DEPARTMENT Provider Note   CSN: [id number] Arrival date & time: 08/03/17  1259     History   Chief Complaint Chief Complaint  Patient presents with  . Tachycardia    hr 150's    HPI Bridget Cortez is a 72 y.o. female.  72 year old female with history as below presents for evaluation of tachycardia.  Patient reports that she went to her primary care doctor today for a routine appointment.  Staff there noted that she was tachycardic.  She was transported by EMS for evaluation of her tachycardia.  She is asymptomatic.  She denies chest pain, shortness of breath, palpitations, or lightheadedness.  She denies prior episodes of arrhythmia.  She denies a prior history of CAD.  The history is provided by the patient.  Illness  This is a new problem. The current episode started 1 to 2 hours ago. Episode frequency: uncertain. The problem has been resolved. Pertinent negatives include no chest pain, no abdominal pain, no headaches and no shortness of breath. Nothing aggravates the symptoms. Nothing relieves the symptoms. She has tried nothing for the symptoms.    Past Medical History:  Diagnosis Date  . Abnormal Pap smear 2006   vaginal medicine according to patient  . Allergy   . Arthritis   . Asthma   . 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 Breast cancer ([place]) 1999   Left  . CAP (community acquired pneumonia) 08/06/2014  . Cataract    cataracts lt. eye    cataract removed.  . Chiari malformation   . Diverticulosis   . GERD (gastroesophageal reflux disease)   . H/O osteopenia    08/2006  . H/O varicella   . Heart murmur   . Hypertension   . Monilial vaginitis 07/2007  . Multiple allergies   . Ovarian cyst   . Personal history of colonic adenomas 03/13/2012  . Personal history of radiation therapy 1990  . Pneumonia 07/2014  . 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 Yeast infection     Patient Active Problem List   Diagnosis  Date Noted  . Asthma 08/06/2014  . Anxiety about health 08/06/2014  . Hypertension 08/06/2014  . Personal history of colonic adenomas 03/20/2012  . Chiari malformation   . Heart murmur   . H/O varicella   . Yeast infection   . Cancer ([place])   . H/O osteopenia   . Abnormal Pap smear   . Vulvar intraepithelial neoplasia III (VIN III) 04/12/2009  . Atrophy of vagina 06/23/2005    Past Surgical History:  Procedure Laterality Date  . ABDOMINAL HYSTERECTOMY    . ABDOMINAL SURGERY    . BREAST LUMPECTOMY Left 1990  . BREAST SURGERY     Lumpectomy, left breast  . CATARACT EXTRACTION Left 2018  . COLONOSCOPY    . COLONOSCOPY W/ BIOPSIES    . HERNIA REPAIR    . KNEE SURGERY     right  . lt. eye aneurysm surgery Left 2017  . MOUTH SURGERY  2019   rt. side gum surgery  Lt. done 3 years ago     OB History    Gravida  0   Para  0   Term      Preterm  0   AB      Living  1     SAB      TAB      Ectopic      Multiple  Live Births           Obstetric Comments  PT HAS A ADOPTED CHILD GIRL         Home Medications    Prior to Admission medications   Medication Sig Start Date End Date Taking? Authorizing Provider  acetaminophen (TYLENOL) 325 MG tablet Take 2 tablets (650 mg total) by mouth every 6 (six) hours as needed for mild pain, fever or headache (or Fever >/= 101). 08/11/14   Hongalgi, Anand D, MD  albuterol (PROVENTIL) (2.5 MG/3ML) 0.083% nebulizer solution Take 2.5 mg by nebulization every 6 (six) hours as needed. For shortness of breath    [provider]  amLODipine (NORVASC) 10 MG tablet Take 10 mg by mouth every evening.     [provider]  Calcium Carbonate-Vitamin D (CALTRATE 600+D PO) Take 1 tablet by mouth daily.    [provider]  celecoxib (CELEBREX) 200 MG capsule Take 200 mg by mouth daily as needed for moderate pain.     [provider]  cetirizine (ZYRTEC) 10 MG tablet Take 10 mg by mouth daily.     [provider]  esomeprazole (NEXIUM) 40 MG capsule Take 40 mg by mouth daily before breakfast.    [provider]  fluticasone (FLONASE) 50 MCG/ACT nasal spray Place 1 spray into the nose at bedtime.  03/16/12   [provider]  Fluticasone-Salmeterol (ADVAIR) 250-50 MCG/DOSE AEPB Inhale 1 puff into the lungs every 12 (twelve) hours.    [provider]  losartan (COZAAR) 50 MG tablet Take 100 mg by mouth daily.     [provider]  menthol-cetylpyridinium (CEPACOL) 3 MG lozenge Take 1 lozenge (3 mg total) by mouth as needed for sore throat. Patient not taking: Reported on 05/29/2017 08/11/14   Modena Jansky, MD  Multiple Vitamin (MULTIVITAMIN) tablet Take 1 tablet by mouth daily.    [provider]  nystatin (MYCOSTATIN) 100000 UNIT/ML suspension Take 5 mLs by mouth 4 (four) times daily.    [provider]  nystatin (MYCOSTATIN) 100000 UNIT/ML suspension Use as directed 5 mLs (500,000 Units total) in the mouth or throat 4 (four) times daily. 01/29/17   Palumbo, April, MD  potassium chloride SA (K-DUR,KLOR-CON) 20 MEQ tablet Take 2 tablets (40 mEq total) by mouth daily. Patient taking differently: Take 20 mEq by mouth 3 (three) times daily.  08/11/14   Hongalgi, Lenis Dickinson, MD  simvastatin (ZOCOR) 20 MG tablet Take 20 mg by mouth every evening.    [provider]  triamcinolone (KENALOG) 0.1 % paste Use as directed 1 application in the mouth or throat 2 (two) times daily.    [provider]  vitamin C (ASCORBIC ACID) 500 MG tablet Take 500 mg by mouth daily.    [provider]    Family History Family History  Problem Relation Age of Onset  . Cancer Mother   . Heart failure Other   . Colon cancer Neg Hx   . Esophageal cancer Neg Hx   . Rectal cancer Neg Hx   . Stomach cancer Neg Hx   . Colon polyps Neg Hx     Social History Social History   Tobacco Use  . Smoking status: Former Smoker    Last  attempt to quit: 01/23/1978    Years since quitting: 39.5  . Smokeless tobacco: Never Used  Substance Use Topics  . Alcohol use: No  . Drug use: No     Allergies  Ephedrine and Cortisone   Review of Systems Review of Systems  Respiratory: Negative for shortness of breath.   Cardiovascular: Negative for chest pain.  Gastrointestinal: Negative for abdominal pain.  Neurological: Negative for headaches.  All other systems reviewed and are negative.    Physical Exam Updated Vital Signs BP 107/74   Pulse 89   Temp 98.5 F (36.9 C) (Oral)   Resp 19   SpO2 99%   Physical Exam  Constitutional: She is oriented to person, place, and time. She appears well-developed and well-nourished. No distress.  HENT:  Head: Normocephalic and atraumatic.  Mouth/Throat: Oropharynx is clear and moist.  Eyes: Pupils are equal, round, and reactive to light. Conjunctivae and EOM are normal.  Neck: Normal range of motion. Neck supple.  Cardiovascular: Normal rate, regular rhythm and normal heart sounds.  Pulmonary/Chest: Effort normal and breath sounds normal. No respiratory distress.  Abdominal: Soft. She exhibits no distension. There is no tenderness.  Musculoskeletal: Normal range of motion. She exhibits no edema or deformity.  Neurological: She is alert and oriented to person, place, and time.  Skin: Skin is warm and dry.  Psychiatric: She has a normal mood and affect.  Nursing note and vitals reviewed.    ED Treatments / Results  Labs (all labs ordered are listed, but only abnormal results are displayed) Labs Reviewed  BASIC METABOLIC PANEL  CBC WITH DIFFERENTIAL/PLATELET  MAGNESIUM  I-STAT TROPONIN, ED    EKG EKG Interpretation  Date/Time:  Friday August 03 2017 15:26:54 EDT Ventricular Rate:  149 PR Interval:    QRS Duration: 169 QT Interval:  380 QTC Calculation: 599 R Axis:   -48 Text Interpretation:  Sinus tachycardia IVCD, consider atypical RBBB LVH with IVCD, LAD and  secondary repol abnrm Inferior infarct, acute (RCA) Prolonged QT interval Probable RV involvement, suggest recording right precordial leads Confirmed by Dene Gentry [phone]) on 08/03/2017 3:40:19 PM   Radiology No results found.  Procedures Procedures (including critical care time)  Medications Ordered in ED Medications  amiodarone (NEXTERONE) IV bolus only 150 mg/100 mL (has no administration in time range)     Initial Impression / Assessment and Plan / ED Course  I have reviewed the triage vital signs and the nursing notes.  Pertinent labs & imaging results that were available during my care of the patient were reviewed by me and considered in my medical decision making (see chart for details).     MDM  Screen complete  Presenting for evaluation of arrhythmia.  She remains asymptomatic during her entire ED evaluation.  Rhythm is noted to be a wide-based tachycardia.   No other significant acute pathology found on screening.  Cardiology Caryl Comes) was consulted.  Patient will be admitted to the cardiology service.   Final Clinical Impressions(s) / ED Diagnoses   Final diagnoses:  Cardiac arrhythmia, unspecified cardiac arrhythmia type    ED Discharge Orders    None       Valarie Merino, MD 08/03/17 [phone]

## 2017-08-03 NOTE — ED Notes (Signed)
Pt sat up in bed and converted, in front of Dr Caryl Comes, from v-tach to nsr.

## 2017-08-03 NOTE — ED Notes (Signed)
Pt hr increases to 150's every time she ambulates to restroom.  Notified that she must receive a purewick and should not ambulate anymore.  Pt highly anxious.

## 2017-08-04 ENCOUNTER — Inpatient Hospital Stay (HOSPITAL_COMMUNITY): Payer: Medicare Other

## 2017-08-04 ENCOUNTER — Other Ambulatory Visit: Payer: Self-pay

## 2017-08-04 DIAGNOSIS — I313 Pericardial effusion (noninflammatory): Secondary | ICD-10-CM | POA: Diagnosis not present

## 2017-08-04 DIAGNOSIS — I471 Supraventricular tachycardia, unspecified: Secondary | ICD-10-CM | POA: Diagnosis present

## 2017-08-04 DIAGNOSIS — I10 Essential (primary) hypertension: Secondary | ICD-10-CM | POA: Diagnosis not present

## 2017-08-04 DIAGNOSIS — I361 Nonrheumatic tricuspid (valve) insufficiency: Secondary | ICD-10-CM | POA: Diagnosis not present

## 2017-08-04 DIAGNOSIS — E785 Hyperlipidemia, unspecified: Secondary | ICD-10-CM | POA: Diagnosis not present

## 2017-08-04 DIAGNOSIS — F329 Major depressive disorder, single episode, unspecified: Secondary | ICD-10-CM | POA: Diagnosis not present

## 2017-08-04 DIAGNOSIS — I472 Ventricular tachycardia: Secondary | ICD-10-CM | POA: Diagnosis not present

## 2017-08-04 DIAGNOSIS — F419 Anxiety disorder, unspecified: Secondary | ICD-10-CM | POA: Diagnosis not present

## 2017-08-04 DIAGNOSIS — R000 Tachycardia, unspecified: Secondary | ICD-10-CM | POA: Diagnosis not present

## 2017-08-04 LAB — ECHOCARDIOGRAM COMPLETE
Height: 69 in
WEIGHTICAEL: 4049.41 [oz_av]

## 2017-08-04 LAB — BASIC METABOLIC PANEL
ANION GAP: 9 (ref 5–15)
BUN: 10 mg/dL (ref 8–23)
CALCIUM: 8.8 mg/dL — AB (ref 8.9–10.3)
CO2: 24 mmol/L (ref 22–32)
CREATININE: 0.79 mg/dL (ref 0.44–1.00)
Chloride: 106 mmol/L (ref 98–111)
GFR calc non Af Amer: >60 mL/min (ref >60)
Glucose, Bld: 91 mg/dL (ref 70–99)
Potassium: 3.9 mmol/L (ref 3.5–5.1)
SODIUM: 139 mmol/L (ref 135–145)

## 2017-08-04 LAB — CBC
HCT: 38.7 % (ref 36.0–46.0)
HEMOGLOBIN: 12.3 g/dL (ref 12.0–15.0)
MCH: 28 pg (ref 26.0–34.0)
MCHC: 31.8 g/dL (ref 30.0–36.0)
MCV: 88.2 fL (ref 78.0–100.0)
PLATELETS: 245 10*3/uL (ref 150–400)
RBC: 4.39 MIL/uL (ref 3.87–5.11)
RDW: 15.6 % — ABNORMAL HIGH (ref 11.5–15.5)
WBC: 4.6 10*3/uL (ref 4.0–10.5)

## 2017-08-04 LAB — MRSA PCR SCREENING: MRSA by PCR: NEGATIVE

## 2017-08-04 LAB — TROPONIN I: Troponin I: <0.03 ng/mL (ref <0.03)

## 2017-08-04 LAB — TSH: TSH: 3.908 u[IU]/mL (ref 0.350–4.500)

## 2017-08-04 MED ORDER — METOPROLOL SUCCINATE ER 50 MG PO TB24: 50.0000 mg | ORAL_TABLET | Freq: Every day | ORAL | 3 refills | Status: DC

## 2017-08-04 MED ORDER — METOPROLOL SUCCINATE ER 50 MG PO TB24: 50.0000 mg | ORAL_TABLET | Freq: Every day | ORAL | Status: DC

## 2017-08-04 NOTE — Progress Notes (Signed)
  Echocardiogram 2D Echocardiogram has been performed.  Bridget Cortez 08/04/2017, 3:32 PM

## 2017-08-04 NOTE — Discharge Summary (Addendum)
Discharge Summary    Patient ID: Bridget Cortez,  MRN: [id number], DOB/AGE: [birth year] 72 y.o.  Admit date: 08/03/2017 Discharge date: 08/04/2017  Primary Care Provider: [name] Primary Cardiologist: Lauree Chandler, MD   Discharge Diagnoses    Principal Problem:   Wide-complex tachycardia [facility]) Active Problems:   Essential hypertension   History of Present Illness     Bridget Cortez is a 72 y.o. female with past medical history of HTN, HLD, asthma, depression, anxiety, GERD, OA, remote breast cancer s/p lumpectomy and XRT, and retinal embolus who presented to [facility] ED on 08/03/2017 from PCP's office for evaluation of tachycardia.  The patient had reported being in her usual state of health until she was at her PCPs' office for a routine visit and was found to be in presumed new onset tachycardia. Her initial EKG showed a narrow complex sinus tachycardia but she did develop a wide-complex tachycardia which was thought to be consistent with VT. She converted back to normal sinus rhythm while in the ED. She reported overall being asymptomatic and denied any associated palpitations, chest discomfort, or dyspnea.  This was reviewed by Dr. Caryl Comes and it was thought that its initiation with PAC's was most consistent with SVT. She was started on beta-blocker therapy and admitted for further observation.  Hospital Course     Consultants: None   The following morning, she denied any repeat episodes of chest pain or dyspnea on exertion. Was noted to be very anxious. Cyclic troponin values remain negative and TSH was within normal limits.  Electrolytes are stable. The plan was to obtain an echocardiogram and if EF was preserved, for her to be discharged on beta-blocker therapy. It was thought that while the morphology suggested VT, that it was likely aberrant SVT.  Echocardiogram was performed on 08/04/2017 and showed a preserved EF of 55 to 60% with no regional wall  motion abnormalities. Did have mild MR and a small pericardial effusion.  Was last evaluated by Dr. Lovena Le and deemed stable for discharge. A staff message has been sent to the office to arrange for follow-up within the next 2 weeks.   _____________  Discharge Vitals Blood pressure 114/76, pulse 69, temperature 97.6 F (36.4 C), temperature source Oral, resp. rate 15, height 5\' 9"  (1.753 m), weight 253 lb 1.4 oz (114.8 kg), SpO2 97 %.  Filed Weights   08/03/17 1958  Weight: 253 lb 1.4 oz (114.8 kg)    Labs & Radiologic Studies     CBC Recent Labs    08/03/17 1407 08/04/17 0331  WBC 4.5 4.6  NEUTROABS 2.5  --   HGB 14.1 12.3  HCT 44.2 38.7  MCV 88.8 88.2  PLT 300 749   Basic Metabolic Panel Recent Labs    08/03/17 1407 08/04/17 0331  NA 141 139  K 4.1 3.9  CL 106 106  CO2 24 24  GLUCOSE 99 91  BUN 10 10  CREATININE 0.81 0.79  CALCIUM 9.2 8.8*  MG 2.0  --    Liver Function Tests No results for input(s): AST, ALT, ALKPHOS, BILITOT, PROT, ALBUMIN in the last 72 hours. No results for input(s): LIPASE, AMYLASE in the last 72 hours. Cardiac Enzymes Recent Labs    08/03/17 2102 08/04/17 0331 08/04/17 0903  TROPONINI <0.03 <0.03 <0.03   BNP Invalid input(s): POCBNP D-Dimer No results for input(s): DDIMER in the last 72 hours. Hemoglobin A1C No results for input(s): HGBA1C in the last 72 hours. Fasting  Lipid Panel No results for input(s): CHOL, HDL, LDLCALC, TRIG, CHOLHDL, LDLDIRECT in the last 72 hours. Thyroid Function Tests Recent Labs    08/04/17 0331  TSH 3.908    No results found.   Diagnostic Studies/Procedures     Echocardiogram: 08/04/2017 Study Conclusions  - Left ventricle: The cavity size was normal. Wall thickness was   increased in a pattern of mild LVH. Systolic function was normal.   The estimated ejection fraction was in the range of 55% to 60%.   Wall motion was normal; there were no regional wall motion   abnormalities.  Indeterminate diastolic function. - Aortic valve: Mildly calcified annulus. Trileaflet; mildly   calcified leaflets. - Mitral valve: Mildly calcified annulus. There was mild   regurgitation. - Left atrium: The atrium was at the upper limits of normal in   size. - Right atrium: The atrium was mildly dilated. Central venous   pressure (est): 3 mm Hg. - Atrial septum: No defect or patent foramen ovale was identified. - Tricuspid valve: There was mild regurgitation. - Pulmonary arteries: PA peak pressure: 23 mm Hg (S). - Pericardium, extracardiac: A small pericardial effusion was   identified posterior to the heart.   Disposition   Pt is being discharged home today in good condition.  Follow-up Plans & Appointments    Follow-up Information    Deboraha Sprang, MD Follow up.   Specialty:  Cardiology Why:  The office will contact you to arrange Cardiology follow-up. If you do not hear from them in 2-3 business days, please call the number provided.  Contact information: [address] [place] [address] [phone]          Discharge Instructions    Diet - low sodium heart healthy   Complete by:  As directed    Increase activity slowly   Complete by:  As directed       Discharge Medications     Medication List    STOP taking these medications   amLODipine 10 MG tablet Commonly known as:  NORVASC     TAKE these medications   acetaminophen 325 MG tablet Commonly known as:  TYLENOL Take 2 tablets (650 mg total) by mouth every 6 (six) hours as needed for mild pain, fever or headache (or Fever >/= 101).   albuterol (2.5 MG/3ML) 0.083% nebulizer solution Commonly known as:  PROVENTIL Take 2.5 mg by nebulization every 6 (six) hours as needed for shortness of breath.   aspirin EC 81 MG tablet Take 81 mg by mouth daily.   atorvastatin 20 MG tablet Commonly known as:  LIPITOR Take 20 mg by mouth daily after supper.   CALTRATE 600+D PO Take 1  tablet by mouth daily with lunch.   celecoxib 200 MG capsule Commonly known as:  CELEBREX Take 200 mg by mouth daily as needed (for arthritis-related pain).   cetirizine 10 MG tablet Commonly known as:  ZYRTEC Take 10 mg by mouth daily.   esomeprazole 40 MG capsule Commonly known as:  NEXIUM Take 40 mg by mouth daily before breakfast.   fluticasone 50 MCG/ACT nasal spray Commonly known as:  FLONASE Place 1 spray into both nostrils at bedtime.   Fluticasone-Salmeterol 250-50 MCG/DOSE Aepb Commonly known as:  ADVAIR Inhale 1 puff into the lungs every 12 (twelve) hours.   losartan 100 MG tablet Commonly known as:  COZAAR Take 100 mg by mouth daily.   metoprolol succinate 50 MG 24 hr tablet Commonly known as:  TOPROL-XL Take 1 tablet (50 mg total) by mouth daily. Take with or immediately following a meal. Start taking on:  08/05/2017   multivitamin tablet Take 1 tablet by mouth daily with lunch.   polyethylene glycol powder powder Commonly known as:  GLYCOLAX/MIRALAX Take 17 g by mouth daily as needed for mild constipation.   potassium chloride SA 20 MEQ tablet Commonly known as:  K-DUR,KLOR-CON Take 2 tablets (40 mEq total) by mouth daily. What changed:    how much to take  when to take this   simethicone 125 MG chewable tablet Commonly known as:  MYLICON Chew 615 mg by mouth as needed for flatulence.   SYSTANE ULTRA PF 0.4-0.3 % Soln Generic drug:  Polyethyl Glyc-Propyl Glyc PF Place 1-2 drops into both eyes daily.   vitamin C 500 MG tablet Commonly known as:  ASCORBIC ACID Take 500 mg by mouth daily.         Allergies Allergies  Allergen Reactions  . Ephedrine Shortness Of Breath  . Cortisone Other (See Comments)    Severe muscle spasms     Acute coronary syndrome (MI, NSTEMI, STEMI, etc) this admission?: No.      Outstanding Labs/Studies   None  Duration of Discharge Encounter   Greater than 30 minutes including physician  time.  Signed, Erma Heritage, [name] 08/04/2017, 6:09 PM  EP/Cardiology Attending  Patient seen and examined. See my note as well. She has normal LV function and was asymptomatic with her wide QRS tachycardia. She will be discharged on a beta blocker. Followup as above.  Mikle Bosworth.D.

## 2017-08-04 NOTE — Progress Notes (Signed)
Progress Note  Patient Name: Bridget Cortez Date of Encounter: 08/04/2017  Primary Cardiologist: Bridget Chandler, MD   Subjective   No chest pain or sob. Anxious  Inpatient Medications    Scheduled Meds: . enoxaparin (LOVENOX) injection  40 mg Subcutaneous Q24H  . fluticasone  1 spray Each Nare Daily  . loratadine  10 mg Oral Daily  . losartan  100 mg Oral Daily  . metoprolol tartrate  50 mg Oral BID  . mometasone-formoterol  2 puff Inhalation BID  . multivitamin with minerals  1 tablet Oral Daily  . pantoprazole  40 mg Oral Daily  . potassium chloride SA  20 mEq Oral BID  . simvastatin  20 mg Oral QPM  . vitamin C  500 mg Oral Daily   Continuous Infusions:  PRN Meds: acetaminophen, albuterol, celecoxib, LORazepam, ondansetron **OR** ondansetron (ZOFRAN) IV, simethicone, traZODone   Vital Signs    Vitals:   08/03/17 1930 08/03/17 1958 08/03/17 2110 08/04/17 0510  BP: 122/74 125/83  114/76  Pulse: 88 86  69  Resp:  18  15  Temp:  97.9 F (36.6 C)  97.6 F (36.4 C)  TempSrc:  Oral  Oral  SpO2: 98% 98% 100% 95%  Weight:  253 lb 1.4 oz (114.8 kg)    Height:  5\' 9"  (1.753 m)      Intake/Output Summary (Last 24 hours) at 08/04/2017 0903 Last data filed at 08/04/2017 0500 Gross per 24 hour  Intake 500 ml  Output 1600 ml  Net -1100 ml   Filed Weights   08/03/17 1958  Weight: 253 lb 1.4 oz (114.8 kg)    Telemetry    Wide QRS tachy terminated yesterday afternoon, NSR since - Personally Reviewed  ECG    none - Personally Reviewed  Physical Exam   GEN: No acute distress.   Neck: 6 cm JVD Cardiac: RRR, no murmurs, rubs, or gallops.  Respiratory: Clear to auscultation bilaterally. GI: Soft, nontender, non-distended  MS: No edema; No deformity. Neuro:  Nonfocal  Psych: Normal affect   Labs    Chemistry Recent Labs  Lab 08/03/17 1407 08/04/17 0331  NA 141 139  K 4.1 3.9  CL 106 106  CO2 24 24  GLUCOSE 99 91  BUN 10 10  CREATININE  0.81 0.79  CALCIUM 9.2 8.8*  GFRNONAA >60 >60  GFRAA >60 >60  ANIONGAP 11 9     Hematology Recent Labs  Lab 08/03/17 1407 08/04/17 0331  WBC 4.5 4.6  RBC 4.98 4.39  HGB 14.1 12.3  HCT 44.2 38.7  MCV 88.8 88.2  MCH 28.3 28.0  MCHC 31.9 31.8  RDW 15.3 15.6*  PLT 300 245    Cardiac Enzymes Recent Labs  Lab 08/03/17 2102 08/04/17 0331  TROPONINI <0.03 <0.03    Recent Labs  Lab 08/03/17 1416  TROPIPOC 0.00     BNPNo results for input(s): BNP, PROBNP in the last 168 hours.   DDimer No results for input(s): DDIMER in the last 168 hours.   Radiology    No results found.  Cardiac Studies   2D echo is pending  Patient Profile     72 y.o. female admitted with asymptomatic wide QRS tachycardia  Assessment & Plan    1. Wide QRS tachycardia - reviewed Dr. Angelina Cortez note. It is clear that she was asymptomatic. If her EF looks good, she will be discharged home on a beta blocker, toprol 50 mg daily. Even though morphology criteria suggest VT,  I strongly suspect that this is abherrant SVT. If her EF is down, then she will need to undergo cardiac cath on Monday. Await echo.  2. Obesity - while not causing her tachycardia, I encouraged her to lose weight.  For questions or updates, please contact Bridget Cortez Please consult [email or web address] for contact info under Cardiology/STEMI.      Signed, Bridget Peru, MD  08/04/2017, 9:03 AM  Patient ID: Bridget Cortez, female   DOB: Nov 07, 1945, 72 y.o.   MRN: [id number]

## 2017-08-04 NOTE — Discharge Instructions (Signed)
WE WILL HOLD AMLODIPINE FOR NOW SO WE CAN SEE HOW YOUR BLOOD PRESSURE RESPONDS TO TOPROL-XL.

## 2017-08-04 NOTE — Plan of Care (Signed)
Pt per order D/C'd home

## 2017-08-07 ENCOUNTER — Telehealth: Payer: Self-pay | Admitting: Internal Medicine

## 2017-08-07 NOTE — Telephone Encounter (Signed)
Pt d/c from hospital and per Bernerd Pho, PA she needs a 2 week fu with Caryl Comes or App. Pt does not want to wait to see him in 2 weeks. She is "new" to heart issues and is scared, has lots of questions. Would like to know what she can take to help her sleep that won't effect her heart. She would like to know when she can resume normal activities. Pls advise [phone] or [phone].

## 2017-08-07 NOTE — Telephone Encounter (Signed)
Called pt. She had questions on activity and s/s of SVT. I educated her on when she should seek care dealing with her SVT. Pt can have activity as tolerated, starting out with light exercise. Pt also has c/o trouble sleeping. She will try melatonin and be in touch with her PCP regarding her sleep. F/up appt from her hospital stay has been scheduled for 8/13. Pt verbalizes understanding and had no additional questions.

## 2017-08-09 DIAGNOSIS — R9431 Abnormal electrocardiogram [ECG] [EKG]: Secondary | ICD-10-CM | POA: Diagnosis not present

## 2017-08-09 DIAGNOSIS — F419 Anxiety disorder, unspecified: Secondary | ICD-10-CM | POA: Diagnosis not present

## 2017-08-09 DIAGNOSIS — I10 Essential (primary) hypertension: Secondary | ICD-10-CM | POA: Diagnosis not present

## 2017-08-09 DIAGNOSIS — Z6838 Body mass index (BMI) 38.0-38.9, adult: Secondary | ICD-10-CM | POA: Diagnosis not present

## 2017-08-24 DIAGNOSIS — H40023 Open angle with borderline findings, high risk, bilateral: Secondary | ICD-10-CM | POA: Diagnosis not present

## 2017-09-03 ENCOUNTER — Encounter: Payer: Self-pay | Admitting: Internal Medicine

## 2017-09-03 NOTE — Progress Notes (Signed)
Patient Care Team: [name], MD as PCP - General (Internal Medicine) Burnell Blanks, MD as PCP - Cardiology (Cardiology)   HPI  Bridget Cortez is a 72 y.o. female Seen in followup for [facility] which had ECG features of VT (concordance//aVR) but initiation and termination sequences suggestive of SVT--GT agreed more likely SVT  Rx with amlodipine>> metoprolol  DATE TEST EF   8/16 Echo   55-65 %   7/19 Echo   55-60 %         She has had no interval tachycardia of which she is aware   She is much sleepier however since discharge.  She was started on metoprolol succinate as noted; in addition, because of outpatient hypertension she was put back on her full dose of amlodipine.  Abnormal home blood pressures were 130-150.  She comes in today at 115.  No edema.  Records and Results Reviewed   Past Medical History:  Diagnosis Date  . Abnormal Pap smear 2006   vaginal medicine according to patient  . Allergy   . Arthritis   . Asthma   . 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 Breast cancer ([place]) 1999   Left  . CAP (community acquired pneumonia) 08/06/2014  . Cataract    cataracts lt. eye    cataract removed.  . Chiari malformation   . Diverticulosis   . GERD (gastroesophageal reflux disease)   . H/O osteopenia    08/2006  . H/O varicella   . Heart murmur   . Hypertension   . Monilial vaginitis 07/2007  . Multiple allergies   . Ovarian cyst   . Personal history of colonic adenomas 03/13/2012  . Personal history of radiation therapy 1990  . Pneumonia 07/2014  . 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 Yeast infection     Past Surgical History:  Procedure Laterality Date  . ABDOMINAL HYSTERECTOMY    . ABDOMINAL SURGERY    . BREAST LUMPECTOMY Left 1990  . BREAST SURGERY     Lumpectomy, left breast  . CATARACT EXTRACTION Left 2018  . COLONOSCOPY    . COLONOSCOPY W/ BIOPSIES    . HERNIA REPAIR    . KNEE SURGERY     right  . lt. eye  aneurysm surgery Left 2017  . MOUTH SURGERY  2019   rt. side gum surgery  Lt. done 3 years ago    Current Meds  Medication Sig  . acetaminophen (TYLENOL) 325 MG tablet Take 2 tablets (650 mg total) by mouth every 6 (six) hours as needed for mild pain, fever or headache (or Fever >/= 101).  Marland Kitchen albuterol (PROVENTIL) (2.5 MG/3ML) 0.083% nebulizer solution Take 2.5 mg by nebulization every 6 (six) hours as needed for shortness of breath.   Marland Kitchen amLODipine (NORVASC) 10 MG tablet Take 1 tablet by mouth at bedtime.  Marland Kitchen aspirin EC 81 MG tablet Take 81 mg by mouth daily.  Marland Kitchen atorvastatin (LIPITOR) 20 MG tablet Take 20 mg by mouth daily after supper.  . Calcium Carbonate-Vitamin D (CALTRATE 600+D PO) Take 1 tablet by mouth daily with lunch.   . celecoxib (CELEBREX) 200 MG capsule Take 200 mg by mouth daily as needed (for arthritis-related pain).   . cetirizine (ZYRTEC) 10 MG tablet Take 10 mg by mouth daily.  . diazepam (VALIUM) 5 MG tablet Take 0.5 tablets by mouth as needed for sleep.  Marland Kitchen esomeprazole (NEXIUM) 40 MG capsule Take 40 mg by mouth daily  before breakfast.  . fluticasone (FLONASE) 50 MCG/ACT nasal spray Place 1 spray into both nostrils at bedtime.   . Fluticasone-Salmeterol (ADVAIR) 250-50 MCG/DOSE AEPB Inhale 1 puff into the lungs every 12 (twelve) hours.  Marland Kitchen losartan (COZAAR) 100 MG tablet Take 100 mg by mouth daily.  . metoprolol succinate (TOPROL-XL) 50 MG 24 hr tablet Take 1 tablet (50 mg total) by mouth daily. Take with or immediately following a meal.  . Multiple Vitamin (MULTIVITAMIN) tablet Take 1 tablet by mouth daily with lunch.   Vladimir Faster Glyc-Propyl Glyc PF (SYSTANE ULTRA PF) 0.4-0.3 % SOLN Place 1-2 drops into both eyes daily.  . polyethylene glycol powder (GLYCOLAX/MIRALAX) powder Take 17 g by mouth daily as needed for mild constipation.  . potassium chloride SA (K-DUR,KLOR-CON) 20 MEQ tablet Take 2 tablets (40 mEq total) by mouth daily. (Patient taking differently: Take 20 mEq  by mouth 3 (three) times daily. )  . simethicone (MYLICON) 676 MG chewable tablet Chew 125 mg by mouth as needed for flatulence.  . vitamin C (ASCORBIC ACID) 500 MG tablet Take 500 mg by mouth daily.    Allergies  Allergen Reactions  . Ephedrine Shortness Of Breath  . Cortisone Other (See Comments)    Severe muscle spasms      Review of Systems negative except from HPI and PMH  Physical Exam Pulse 75   Ht 5\' 7"  (1.702 m)   Wt 252 lb 3.2 oz (114.4 kg)   SpO2 97%   BMI 39.50 kg/m  Well developed and well nourished in no acute distress HENT normal E scleral and icterus clear Neck Supple JVP flat; carotids brisk and full Clear to ausculation regular rate and rhythm, no murmurs gallops or rub Soft with active bowel sounds No clubbing cyanosis  Edema Alert and oriented, grossly normal motor and sensory function Skin Warm and Dry  ECG  NSR 75 17/10/40  Assessment and  Plan  WCT probably SVT  HTN  Fatigue   Plan to decrease her amlodipine and allow her blood pressure come up a little bit.  This may be contributing to the fatigue although I think more likely it is the metoprolol succinate.  We have given her prescriptions for atenolol 50, Bystolic 5, and metoprolol tartrate 50.  She will take them in random order looking for better tolerance.  She will let us know which of these she tolerates best.  We spent more than 50% of our >25 min visit in face to face counseling regarding the above .        Current medicines are reviewed at length with the patient today .  The patient does not  have concerns regarding medicines.

## 2017-09-04 ENCOUNTER — Ambulatory Visit (INDEPENDENT_AMBULATORY_CARE_PROVIDER_SITE_OTHER): Payer: Medicare Other | Admitting: Internal Medicine

## 2017-09-04 VITALS — BP 118/72 | HR 75 | Ht 67.0 in | Wt 252.2 lb

## 2017-09-04 DIAGNOSIS — I471 Supraventricular tachycardia: Secondary | ICD-10-CM

## 2017-09-04 DIAGNOSIS — R000 Tachycardia, unspecified: Secondary | ICD-10-CM

## 2017-09-04 DIAGNOSIS — I472 Ventricular tachycardia: Secondary | ICD-10-CM

## 2017-09-04 MED ORDER — NEBIVOLOL HCL 5 MG PO TABS: 5.0000 mg | ORAL_TABLET | Freq: Every day | ORAL | 0 refills | Status: DC

## 2017-09-04 MED ORDER — METOPROLOL TARTRATE 50 MG PO TABS: 50.0000 mg | ORAL_TABLET | Freq: Two times a day (BID) | ORAL | 0 refills | Status: DC

## 2017-09-04 MED ORDER — ATENOLOL 50 MG PO TABS: 50.0000 mg | ORAL_TABLET | Freq: Every day | ORAL | 3 refills | Status: DC

## 2017-09-04 MED ORDER — ATENOLOL 25 MG PO TABS: 50.0000 mg | ORAL_TABLET | Freq: Every day | ORAL | 3 refills | Status: DC

## 2017-09-04 NOTE — Patient Instructions (Signed)
Medication Instructions:  Your physician has recommended you make the following change in your medication:   1. Decrease your Amlodipine to 5mg , half tablet, one time per day.  In 2 weeks, if you are still tired, discontinue your Metoprolol Succinate  Begin taking one of the two beta blockers below.  Do not take more than one type of beta blocker at a time. Try your new beta blocker out for 2-3 weeks to see how you feel. If that beta blocker is not working well for you, try the next beta blocker. Call us and let us know which one you tolerate best.   1. Bystolic, 5mg , once per day      OR  2. Metoprolol Tartrate 50mg , one tablet, two times per day.  OR  3. Atenolol 50mg , one tablet, once per day   Labwork: None ordered.  Testing/Procedures: None ordered.  Follow-Up: Your physician wants you to follow-up in: 6 months with Dr Caryl Comes. You will receive a reminder letter in the mail two months in advance. If you don't receive a letter, please call our office to schedule the follow-up appointment.   Any Other Special Instructions Will Be Listed Below (If Applicable).     If you need a refill on your cardiac medications before your next appointment, please call your pharmacy.

## 2017-09-17 ENCOUNTER — Other Ambulatory Visit: Payer: Self-pay | Admitting: Obstetrics and Gynecology

## 2017-09-17 DIAGNOSIS — Z1231 Encounter for screening mammogram for malignant neoplasm of breast: Secondary | ICD-10-CM

## 2017-09-18 ENCOUNTER — Telehealth: Payer: Self-pay | Admitting: Internal Medicine

## 2017-09-18 NOTE — Telephone Encounter (Signed)
LVM for pt.

## 2017-09-18 NOTE — Telephone Encounter (Signed)
Follow up   Pt returning call for nurse, pt states she will be at her home number

## 2017-09-18 NOTE — Telephone Encounter (Signed)
New Message        Patient states she was told that if she was still tired after 2 weeks to discontinue taking "Metoprolol Succinate. Patient needs some directions on what to take next. Bystolic 5 mg x 1 a day/Atenolol 50 mg 1 x a day.

## 2017-09-19 NOTE — Telephone Encounter (Signed)
Spoke with pt who needed carnification regarding her beta blocker trial. Ultimately, she has decided to try the Bystolic 5mg , qd for a few weeks to determine if she is still fatiuged. She will call the office to update [place] when she has decided which beta blocker works well for her.

## 2017-09-27 ENCOUNTER — Telehealth: Payer: Self-pay | Admitting: Internal Medicine

## 2017-09-27 NOTE — Telephone Encounter (Signed)
Follow up:   Patient calling back about her clearance for [facility]   Dr. Hansel Starling   Please call Patient back.

## 2017-09-27 NOTE — Telephone Encounter (Signed)
LVM for pt regarding antibiotics. There is no indications, cardiac wise, pt would need antibiotics for deep cleaning unless her dentist felt it necessary.

## 2017-09-27 NOTE — Telephone Encounter (Signed)
1. What dental office are you calling from? Dr Hansel Starling  2. What is your office phone number? [phone]   3. What is your fax number?[phone]  4. What type of procedure is the patient having performed? Cleans pockets around her gum   5. What date is procedure scheduled or is the patient there now? 10-02-17   6. What is your question (ex. Antibiotics prior to procedure, holding medication-we need to know how long dentist wants pt to hold med)? Does she need Antibiotic before dental treatment- Does pt need to hold any of her medicine?

## 2017-09-28 ENCOUNTER — Telehealth: Payer: Self-pay | Admitting: Internal Medicine

## 2017-09-28 NOTE — Telephone Encounter (Signed)
Follow up   Pt returning call for nurse about dental cleaning

## 2017-09-28 NOTE — Telephone Encounter (Signed)
Spoke with the dental assistant who states pt has told them there have been cardiac changes since her last cleaning. I advised the dental assistant there have been no significant changes other than a change in medications. Pt does not have a hx of valve replacement or implants, there are no indications for antibiotics. The dental assistant asked for this in writing. I advised her to fax [place] a clearance form to go to the appropriate channels and documentation. As a nurse I can only advise over the phone there have been no changes indicating the need for antibiotics, but if they need it in writing, an official clearance needs to be faxed over and documented accordingly.

## 2017-10-15 ENCOUNTER — Telehealth: Payer: Self-pay | Admitting: Internal Medicine

## 2017-10-15 MED ORDER — NEBIVOLOL HCL 5 MG PO TABS: 5.0000 mg | ORAL_TABLET | Freq: Every day | ORAL | 3 refills | Status: DC

## 2017-10-15 NOTE — Telephone Encounter (Signed)
Pt would like to know if the flu shot is safe to take with Bystolic. I informed pt that this was perfectly safe and we always advise our pts to get their yearly flu shot. Pt has verbalized understanding and had no additional questions.

## 2017-10-15 NOTE — Telephone Encounter (Signed)
New Message          *STAT* If patient is at the pharmacy, call can be transferred to refill team.   1. Which medications need to be refilled? (please list name of each medication and dose if known) bystolic 5 mg tab  2. Which pharmacy/location (including street and city if local pharmacy) is medication to be sent to?[facility] on [address]  3. Do they need a 30 day or 90 day supply? 90    Patient states she would like to get this medication. Patient also wants a call back concerning this matter a long with getting a flu shot

## 2017-10-18 ENCOUNTER — Telehealth: Payer: Self-pay | Admitting: Internal Medicine

## 2017-10-18 NOTE — Telephone Encounter (Signed)
Pt calls today to ask if she is safe for a mammogram. I advised pt there were no cardiac restrictions on having mammograms performed. She will need to speak with the physician ordering her mammogram for additional information.

## 2017-10-18 NOTE — Telephone Encounter (Signed)
New Message:    Patient has additional questions about the flu shot and a mammogram

## 2017-10-22 ENCOUNTER — Telehealth: Payer: Self-pay | Admitting: Internal Medicine

## 2017-10-22 NOTE — Telephone Encounter (Signed)
  Pt c/o medication issue:  1. Name of Medication: nebivolol (BYSTOLIC) 5 MG tablet  2. How are you currently taking this medication (dosage and times per day)? Take 1 tablet (5 mg total) by mouth daily.  3. Are you having a reaction (difficulty breathing--STAT)?  Cramps in fingers and legs  4. What is your medication issue: please advise whether to continue medication

## 2017-10-22 NOTE — Telephone Encounter (Signed)
Pt calling today with c/o leg and hand cramps. She wonders if Bystolic could be causing this. I told her this is unlikely. Pt is on Atorvastatin which may be more likely the cause of her cramping. Pt could not tell me the indication as to why she is taking atorvastatin. I advised pt to have her last PCP's OV note and labs including lipids to be faxed to our office. Upon that, I can discuss a Ca scoring test with Dr Caryl Comes in helping to determine if pt needs to remain on a statin. Pt has verbalized understanding and await a call back from me next week when Dr Caryl Comes returns to the office.

## 2017-10-31 ENCOUNTER — Ambulatory Visit
Admission: RE | Admit: 2017-10-31 | Discharge: 2017-10-31 | Disposition: A | Discharge status: Home / Self Care | Payer: Medicare Other | Source: Ambulatory Visit | Attending: Obstetrics and Gynecology | Admitting: Obstetrics and Gynecology

## 2017-10-31 DIAGNOSIS — Z1231 Encounter for screening mammogram for malignant neoplasm of breast: Secondary | ICD-10-CM | POA: Diagnosis not present

## 2017-11-07 DIAGNOSIS — F419 Anxiety disorder, unspecified: Secondary | ICD-10-CM | POA: Diagnosis not present

## 2017-11-07 DIAGNOSIS — R252 Cramp and spasm: Secondary | ICD-10-CM | POA: Diagnosis not present

## 2017-11-07 DIAGNOSIS — Z6838 Body mass index (BMI) 38.0-38.9, adult: Secondary | ICD-10-CM | POA: Diagnosis not present

## 2017-11-07 DIAGNOSIS — I10 Essential (primary) hypertension: Secondary | ICD-10-CM | POA: Diagnosis not present

## 2017-11-07 DIAGNOSIS — E782 Mixed hyperlipidemia: Secondary | ICD-10-CM | POA: Diagnosis not present

## 2017-11-09 DIAGNOSIS — Z23 Encounter for immunization: Secondary | ICD-10-CM | POA: Diagnosis not present

## 2017-11-26 DIAGNOSIS — J309 Allergic rhinitis, unspecified: Secondary | ICD-10-CM | POA: Diagnosis not present

## 2017-11-26 DIAGNOSIS — Z6838 Body mass index (BMI) 38.0-38.9, adult: Secondary | ICD-10-CM | POA: Diagnosis not present

## 2017-11-26 DIAGNOSIS — I10 Essential (primary) hypertension: Secondary | ICD-10-CM | POA: Diagnosis not present

## 2017-11-26 DIAGNOSIS — J45909 Unspecified asthma, uncomplicated: Secondary | ICD-10-CM | POA: Diagnosis not present

## 2017-11-26 DIAGNOSIS — R05 Cough: Secondary | ICD-10-CM | POA: Diagnosis not present

## 2017-11-26 DIAGNOSIS — B3781 Candidal esophagitis: Secondary | ICD-10-CM | POA: Diagnosis not present

## 2017-12-12 DIAGNOSIS — M1712 Unilateral primary osteoarthritis, left knee: Secondary | ICD-10-CM | POA: Diagnosis not present

## 2017-12-12 DIAGNOSIS — M170 Bilateral primary osteoarthritis of knee: Secondary | ICD-10-CM | POA: Diagnosis not present

## 2017-12-26 DIAGNOSIS — M1711 Unilateral primary osteoarthritis, right knee: Secondary | ICD-10-CM | POA: Diagnosis not present

## 2018-01-02 DIAGNOSIS — M1711 Unilateral primary osteoarthritis, right knee: Secondary | ICD-10-CM | POA: Diagnosis not present

## 2018-01-09 DIAGNOSIS — M1711 Unilateral primary osteoarthritis, right knee: Secondary | ICD-10-CM | POA: Diagnosis not present

## 2018-01-18 DIAGNOSIS — Z6838 Body mass index (BMI) 38.0-38.9, adult: Secondary | ICD-10-CM | POA: Diagnosis not present

## 2018-01-18 DIAGNOSIS — J45909 Unspecified asthma, uncomplicated: Secondary | ICD-10-CM | POA: Diagnosis not present

## 2018-01-18 DIAGNOSIS — J069 Acute upper respiratory infection, unspecified: Secondary | ICD-10-CM | POA: Diagnosis not present

## 2018-01-30 DIAGNOSIS — M170 Bilateral primary osteoarthritis of knee: Secondary | ICD-10-CM | POA: Diagnosis not present

## 2018-02-12 DIAGNOSIS — D071 Carcinoma in situ of vulva: Secondary | ICD-10-CM | POA: Diagnosis not present

## 2018-02-12 DIAGNOSIS — Z853 Personal history of malignant neoplasm of breast: Secondary | ICD-10-CM | POA: Diagnosis not present

## 2018-02-12 DIAGNOSIS — I10 Essential (primary) hypertension: Secondary | ICD-10-CM | POA: Diagnosis not present

## 2018-02-23 ENCOUNTER — Encounter (HOSPITAL_COMMUNITY): Payer: Self-pay | Admitting: Emergency Medicine

## 2018-02-23 ENCOUNTER — Emergency Department (HOSPITAL_COMMUNITY)
Admission: EM | Admit: 2018-02-23 | Discharge: 2018-02-23 | Disposition: A | Discharge status: Home / Self Care | Payer: Medicare Other | Attending: Emergency Medicine | Admitting: Emergency Medicine

## 2018-02-23 ENCOUNTER — Emergency Department (HOSPITAL_COMMUNITY): Payer: Medicare Other

## 2018-02-23 DIAGNOSIS — Z7982 Long term (current) use of aspirin: Secondary | ICD-10-CM | POA: Diagnosis not present

## 2018-02-23 DIAGNOSIS — J069 Acute upper respiratory infection, unspecified: Secondary | ICD-10-CM | POA: Diagnosis not present

## 2018-02-23 DIAGNOSIS — Z87891 Personal history of nicotine dependence: Secondary | ICD-10-CM | POA: Insufficient documentation

## 2018-02-23 DIAGNOSIS — J4521 Mild intermittent asthma with (acute) exacerbation: Secondary | ICD-10-CM | POA: Diagnosis not present

## 2018-02-23 DIAGNOSIS — R05 Cough: Secondary | ICD-10-CM | POA: Diagnosis not present

## 2018-02-23 DIAGNOSIS — Z79899 Other long term (current) drug therapy: Secondary | ICD-10-CM | POA: Diagnosis not present

## 2018-02-23 DIAGNOSIS — I10 Essential (primary) hypertension: Secondary | ICD-10-CM | POA: Diagnosis not present

## 2018-02-23 DIAGNOSIS — B9789 Other viral agents as the cause of diseases classified elsewhere: Secondary | ICD-10-CM

## 2018-02-23 LAB — CBC WITH DIFFERENTIAL/PLATELET
Abs Immature Granulocytes: 0.01 K/uL (ref 0.00–0.07)
Basophils Absolute: 0 K/uL (ref 0.0–0.1)
Basophils Relative: 1 %
Eosinophils Absolute: 0.1 K/uL (ref 0.0–0.5)
Eosinophils Relative: 2 %
HCT: 37.7 % (ref 36.0–46.0)
Hemoglobin: 12.3 g/dL (ref 12.0–15.0)
Immature Granulocytes: 0 %
Lymphocytes Relative: 38 %
Lymphs Abs: 1.3 K/uL (ref 0.7–4.0)
MCH: 28.7 pg (ref 26.0–34.0)
MCHC: 32.6 g/dL (ref 30.0–36.0)
MCV: 88.1 fL (ref 80.0–100.0)
Monocytes Absolute: 0.4 K/uL (ref 0.1–1.0)
Monocytes Relative: 13 %
Neutro Abs: 1.5 K/uL — ABNORMAL LOW (ref 1.7–7.7)
Neutrophils Relative %: 46 %
Platelets: 238 K/uL (ref 150–400)
RBC: 4.28 MIL/uL (ref 3.87–5.11)
RDW: 15.7 % — ABNORMAL HIGH (ref 11.5–15.5)
WBC: 3.3 K/uL — ABNORMAL LOW (ref 4.0–10.5)
nRBC: 0 % (ref 0.0–0.2)

## 2018-02-23 LAB — BASIC METABOLIC PANEL
Anion gap: 8 (ref 5–15)
BUN: 7 mg/dL — ABNORMAL LOW (ref 8–23)
CO2: 22 mmol/L (ref 22–32)
Calcium: 9 mg/dL (ref 8.9–10.3)
Chloride: 102 mmol/L (ref 98–111)
Creatinine, Ser: 0.86 mg/dL (ref 0.44–1.00)
GFR calc Af Amer: >60 mL/min (ref >60)
GFR calc non Af Amer: >60 mL/min (ref >60)
Glucose, Bld: 88 mg/dL (ref 70–99)
Potassium: 4 mmol/L (ref 3.5–5.1)
Sodium: 132 mmol/L — ABNORMAL LOW (ref 135–145)

## 2018-02-23 LAB — INFLUENZA PANEL BY PCR (TYPE A & B)
Influenza A By PCR: NEGATIVE
Influenza B By PCR: NEGATIVE

## 2018-02-23 LAB — GROUP A STREP BY PCR: Group A Strep by PCR: NOT DETECTED

## 2018-02-23 MED ORDER — PREDNISONE 20 MG PO TABS
60.0000 mg | ORAL_TABLET | Freq: Once | ORAL | Status: AC
  Administered 2018-02-23: 60 mg via ORAL
  Filled 2018-02-23: qty 3

## 2018-02-23 MED ORDER — IPRATROPIUM-ALBUTEROL 0.5-2.5 (3) MG/3ML IN SOLN
3.0000 mL | Freq: Once | RESPIRATORY_TRACT | Status: AC
  Administered 2018-02-23: 3 mL via RESPIRATORY_TRACT
  Filled 2018-02-23: qty 3

## 2018-02-23 MED ORDER — PREDNISONE 20 MG PO TABS: 60.0000 mg | ORAL_TABLET | Freq: Every day | ORAL | 0 refills | Status: AC

## 2018-02-23 MED ORDER — ALBUTEROL SULFATE (5 MG/ML) 0.5% IN NEBU: 2.5000 mg | INHALATION_SOLUTION | Freq: Four times a day (QID) | RESPIRATORY_TRACT | 12 refills | Status: AC | PRN

## 2018-02-23 NOTE — ED Provider Notes (Addendum)
[facility] DEPART[facility] Provider Note   CSN: [id number] Arrival date & time: 02/23/18  1306     History   Chief Complaint Chief Complaint  Patient presents with  . Cough    HPI Bridget Cortez is a 73 y.o. female.  The history is provided by the patient.  URI  Presenting symptoms: congestion, cough and sore throat   Presenting symptoms: no ear pain and no fever   Severity:  Mild Onset quality:  Gradual Timing:  Constant Progression:  Unchanged Chronicity:  New Relieved by:  Nebulizer treatments Worsened by:  Nothing Associated symptoms: wheezing   Associated symptoms: no arthralgias, no headaches, no myalgias, no neck pain, no sinus pain, no sneezing and no swollen glands   Risk factors: chronic respiratory disease     Past Medical History:  Diagnosis Date  . Abnormal Pap smear 2006   vaginal medicine according to patient  . Allergy   . Arthritis   . Asthma   . 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 Breast cancer ([place]) 1999   Left  . CAP (community acquired pneumonia) 08/06/2014  . Cataract    cataracts lt. eye    cataract removed.  . Chiari malformation   . Diverticulosis   . GERD (gastroesophageal reflux disease)   . H/O osteopenia    08/2006  . H/O varicella   . Heart murmur   . Hypertension   . Monilial vaginitis 07/2007  . Multiple allergies   . Ovarian cyst   . Personal history of colonic adenomas 03/13/2012  . Personal history of radiation therapy 1990  . Pneumonia 07/2014  . 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 Yeast infection     Patient Active Problem List   Diagnosis Date Noted  . SVT (supraventricular tachycardia) ([place]) 08/04/2017  . Wide-complex tachycardia ([place]) 08/03/2017  . Asthma 08/06/2014  . Anxiety about health 08/06/2014  . Essential hypertension 08/06/2014  . Personal history of colonic adenomas 03/20/2012  . Chiari malformation   . Heart murmur   . H/O varicella   . Yeast infection     . Cancer ([place])   . H/O osteopenia   . Abnormal Pap smear   . Vulvar intraepithelial neoplasia III (VIN III) 04/12/2009  . Atrophy of vagina 06/23/2005    Past Surgical History:  Procedure Laterality Date  . ABDOMINAL HYSTERECTOMY    . ABDOMINAL SURGERY    . BREAST LUMPECTOMY Left 1990  . BREAST SURGERY     Lumpectomy, left breast  . CATARACT EXTRACTION Left 2018  . COLONOSCOPY    . COLONOSCOPY W/ BIOPSIES    . HERNIA REPAIR    . KNEE SURGERY     right  . lt. eye aneurysm surgery Left 2017  . MOUTH SURGERY  2019   rt. side gum surgery  Lt. done 3 years ago     OB History    Gravida  0   Para  0   Term      Preterm  0   AB      Living  1     SAB      TAB      Ectopic      Multiple      Live Births           Obstetric Comments  PT HAS A ADOPTED CHILD GIRL         Home Medications    Prior to Admission medications  Medication Sig Start Date End Date Taking? Authorizing Provider  acetaminophen (TYLENOL) 325 MG tablet Take 2 tablets (650 mg total) by mouth every 6 (six) hours as needed for mild pain, fever or headache (or Fever >/= 101). 08/11/14  Yes Hongalgi, Lenis Dickinson, MD  amLODipine (NORVASC) 10 MG tablet Take 5 mg by mouth at bedtime.    Yes [provider]  aspirin EC 81 MG tablet Take 81 mg by mouth daily.   Yes [provider]  atorvastatin (LIPITOR) 20 MG tablet Take 20 mg by mouth daily after supper.   Yes [provider]  Calcium Carbonate-Vitamin D (CALTRATE 600+D PO) Take 1 tablet by mouth daily with lunch.    Yes [provider]  celecoxib (CELEBREX) 200 MG capsule Take 200 mg by mouth daily as needed (for arthritis-related pain).    Yes [provider]  cetirizine (ZYRTEC) 10 MG tablet Take 10 mg by mouth daily.   Yes [provider]  diazepam (VALIUM) 5 MG tablet Take 0.5 tablets by mouth as needed for sleep. 08/07/17  Yes [provider]  esomeprazole (NEXIUM) 40 MG capsule  Take 40 mg by mouth daily before breakfast.   Yes [provider]  fluticasone (FLONASE) 50 MCG/ACT nasal spray Place 1 spray into both nostrils at bedtime.  03/16/12  Yes [provider]  Fluticasone-Salmeterol (ADVAIR) 250-50 MCG/DOSE AEPB Inhale 1 puff into the lungs every 12 (twelve) hours.   Yes [provider]  losartan (COZAAR) 100 MG tablet Take 100 mg by mouth daily.   Yes [provider]  Multiple Vitamin (MULTIVITAMIN) tablet Take 1 tablet by mouth daily with lunch.    Yes [provider]  nebivolol (BYSTOLIC) 5 MG tablet Take 1 tablet (5 mg total) by mouth daily. 10/15/17  Yes Deboraha Sprang, MD  Polyethyl Glyc-Propyl Glyc PF (SYSTANE ULTRA PF) 0.4-0.3 % SOLN Place 1-2 drops into both eyes daily.   Yes [provider]  polyethylene glycol powder (GLYCOLAX/MIRALAX) powder Take 17 g by mouth daily as needed for mild constipation.   Yes [provider]  potassium chloride SA (K-DUR,KLOR-CON) 20 MEQ tablet Take 2 tablets (40 mEq total) by mouth daily. Patient taking differently: Take 20 mEq by mouth 3 (three) times daily.  08/11/14  Yes Hongalgi, Lenis Dickinson, MD  rosuvastatin (CRESTOR) 5 MG tablet Take 5 mg by mouth daily.   Yes [provider]  simethicone (MYLICON) 854 MG chewable tablet Chew 125 mg by mouth as needed for flatulence.   Yes [provider]  vitamin C (ASCORBIC ACID) 500 MG tablet Take 500 mg by mouth daily.   Yes [provider]  albuterol (PROVENTIL) (5 MG/ML) 0.5% nebulizer solution Take 0.5 mLs (2.5 mg total) by nebulization every 6 (six) hours as needed for wheezing or shortness of breath. 02/23/18   Dawaun Brancato, DO  predniSONE (DELTASONE) 20 MG tablet Take 3 tablets (60 mg total) by mouth daily for 4 days. 02/23/18 02/27/18  Lennice Sites, DO    Family History Family History  Problem Relation Age of Onset  . Cancer Mother   . Heart failure Other   . Colon cancer Neg Hx   .  Esophageal cancer Neg Hx   . Rectal cancer Neg Hx   . Stomach cancer Neg Hx   . Colon polyps Neg Hx     Social History Social History   Tobacco Use  . Smoking status: Former Smoker    Last attempt to quit:  01/23/1978    Years since quitting: 40.1  . Smokeless tobacco: Never Used  Substance Use Topics  . Alcohol use: No  . Drug use: No     Allergies   Ephedrine and Cortisone   Review of Systems Review of Systems  Constitutional: Negative for chills and fever.  HENT: Positive for congestion and sore throat. Negative for ear pain, sinus pain and sneezing.   Eyes: Negative for pain and visual disturbance.  Respiratory: Positive for cough and wheezing. Negative for shortness of breath.   Cardiovascular: Negative for chest pain and palpitations.  Gastrointestinal: Negative for abdominal pain and vomiting.  Genitourinary: Negative for dysuria and hematuria.  Musculoskeletal: Negative for arthralgias, back pain, myalgias and neck pain.  Skin: Negative for color change and rash.  Neurological: Negative for seizures, syncope and headaches.  All other systems reviewed and are negative.    Physical Exam Updated Vital Signs  ED Triage Vitals  Enc Vitals Group     BP 02/23/18 1330 138/77     Pulse Rate 02/23/18 1315 68     Resp 02/23/18 1330 (!) 27     Temp 02/23/18 1315 99.3 F (37.4 C)     Temp Source 02/23/18 1315 Oral     SpO2 02/23/18 1315 96 %     Weight 02/23/18 1316 250 lb (113.4 kg)     Height 02/23/18 1316 5\' 8"  (1.727 m)     Head Circumference --      Peak Flow --      Pain Score 02/23/18 1316 0     Pain Loc --      Pain Edu? --      Excl. in [place]? --     Physical Exam Vitals signs and nursing note reviewed.  Constitutional:      General: She is not in acute distress.    Appearance: She is well-developed.  HENT:     Head: Normocephalic and atraumatic.     Right Ear: Tympanic membrane normal.     Left Ear: Tympanic membrane normal.     Nose: Nose normal.      Mouth/Throat:     Mouth: Mucous membranes are moist.     Pharynx: Oropharyngeal exudate and posterior oropharyngeal erythema present.  Eyes:     Extraocular Movements: Extraocular movements intact.     Conjunctiva/sclera: Conjunctivae normal.     Pupils: Pupils are equal, round, and reactive to light.  Neck:     Musculoskeletal: Normal range of motion and neck supple.  Cardiovascular:     Rate and Rhythm: Normal rate and regular rhythm.     Pulses: Normal pulses.     Heart sounds: Normal heart sounds. No murmur.  Pulmonary:     Effort: Pulmonary effort is normal. No respiratory distress.     Breath sounds: No stridor. Wheezing present. No rhonchi.  Abdominal:     General: There is no distension.     Palpations: Abdomen is soft.     Tenderness: There is no abdominal tenderness.  Musculoskeletal:     Right lower leg: No edema.     Left lower leg: No edema.  Skin:    General: Skin is warm and dry.     Capillary Refill: Capillary refill takes less than 2 seconds.  Neurological:     General: No focal deficit present.     Mental Status: She is alert.  Psychiatric:        Mood and Affect: Mood normal.  ED Treatments / Results  Labs (all labs ordered are listed, but only abnormal results are displayed) Labs Reviewed  CBC WITH DIFFERENTIAL/PLATELET - Abnormal; Notable for the following components:      Result Value   WBC 3.3 (*)    RDW 15.7 (*)    Neutro Abs 1.5 (*)    All other components within normal limits  BASIC METABOLIC PANEL - Abnormal; Notable for the following components:   Sodium 132 (*)    BUN 7 (*)    All other components within normal limits  GROUP A STREP BY PCR  INFLUENZA PANEL BY PCR (TYPE A & B)    EKG EKG Interpretation  Date/Time:  Saturday February 23 2018 13:20:13 EST Ventricular Rate:  70 PR Interval:    QRS Duration: 108 QT Interval:  400 QTC Calculation: 432 R Axis:   14 Text Interpretation:  Sinus rhythm Confirmed by Lennice Sites [phone]) on 02/23/2018 3:46:33 PM   Radiology Dg Chest 2 View  Result Date: 02/23/2018 CLINICAL DATA:  Cough EXAM: CHEST - 2 VIEW COMPARISON:  01/29/2017 FINDINGS: Moderate cardiomegaly. Normal vascularity. Lungs are under aerated and grossly clear. No pneumothorax or pleural effusion. IMPRESSION: No active cardiopulmonary disease. Electronically Signed   By: Marybelle Killings M.D.   On: 02/23/2018 14:37    Procedures Procedures (including critical care time)  Medications Ordered in ED Medications  ipratropium-albuterol (DUONEB) 0.5-2.5 (3) MG/3ML nebulizer solution 3 mL (3 mLs Nebulization Given 02/23/18 1339)  predniSONE (DELTASONE) tablet 60 mg (60 mg Oral Given 02/23/18 1339)     Initial Impression / Assessment and Plan / ED Course  I have reviewed the triage vital signs and the nursing notes.  Pertinent labs & imaging results that were available during my care of the patient were reviewed by me and considered in my medical decision making (see chart for details).     DAREN DOSWELL is a 73 year old female with history of asthma who presents to the ED with upper respiratory symptoms.  Patient with normal vitals.  No fever.  Patient with cough and congestion for the last several days.  Has been using her inhaler at home with relief.  Has some mild wheezing on exam but overall normal work of breathing.  No hypoxia.  Patient concern for pneumonia.  Has been bothered by her cough but overall is dry cough.  Denies any chest pain, shortness of breath.  Chest x-ray was obtained that showed no pneumonia.  No pneumothorax, no pleural effusion.  Patient negative for influenza.  No significant anemia, electrolyte abnormality, kidney injury.  EKG with sinus rhythm.  No chest pain, doubt cardiac process.  Patient did have sore throat and had some exudates but negative for strep.  Likely from irritation from coughing.  Patient given DuoNeb, prednisone with improvement of symptoms.  Overall patient  likely with viral process causing mild asthma exacerbation.  Recommend continued use of Tylenol, Motrin, breathing treatments at home.  Given prescription for nebulizer solution and prednisone.  Recommend close follow-up with primary care doctor.  Discharged from ED in good condition. Given return precautions.  This chart was dictated using voice recognition software.  Despite best efforts to proofread,  errors can occur which can change the documentation meaning.    Final Clinical Impressions(s) / ED Diagnoses   Final diagnoses:  Viral URI with cough  Mild intermittent asthma with exacerbation    ED Discharge Orders         Ordered  predniSONE (DELTASONE) 20 MG tablet  Daily     02/23/18 1522    albuterol (PROVENTIL) (5 MG/ML) 0.5% nebulizer solution  Every 6 hours PRN     02/23/18 [place], Amel Gianino, DO 02/23/18 [place], [place], DO 02/23/18 1549

## 2018-02-23 NOTE — ED Triage Notes (Signed)
Pt arrives with reports of a cough since Thursday night. Pt reports hx of asthma, denies SOB. States she saw her PCP in January to rule of PNA.

## 2018-02-27 DIAGNOSIS — Z6838 Body mass index (BMI) 38.0-38.9, adult: Secondary | ICD-10-CM | POA: Diagnosis not present

## 2018-02-27 DIAGNOSIS — J45909 Unspecified asthma, uncomplicated: Secondary | ICD-10-CM | POA: Diagnosis not present

## 2018-02-27 DIAGNOSIS — R82998 Other abnormal findings in urine: Secondary | ICD-10-CM | POA: Diagnosis not present

## 2018-02-27 DIAGNOSIS — B3781 Candidal esophagitis: Secondary | ICD-10-CM | POA: Diagnosis not present

## 2018-02-27 DIAGNOSIS — R05 Cough: Secondary | ICD-10-CM | POA: Diagnosis not present

## 2018-02-27 DIAGNOSIS — I10 Essential (primary) hypertension: Secondary | ICD-10-CM | POA: Diagnosis not present

## 2018-02-27 DIAGNOSIS — J069 Acute upper respiratory infection, unspecified: Secondary | ICD-10-CM | POA: Diagnosis not present

## 2018-02-27 DIAGNOSIS — E782 Mixed hyperlipidemia: Secondary | ICD-10-CM | POA: Diagnosis not present

## 2018-03-01 DIAGNOSIS — Z961 Presence of intraocular lens: Secondary | ICD-10-CM | POA: Diagnosis not present

## 2018-03-01 DIAGNOSIS — H25011 Cortical age-related cataract, right eye: Secondary | ICD-10-CM | POA: Diagnosis not present

## 2018-03-01 DIAGNOSIS — H2511 Age-related nuclear cataract, right eye: Secondary | ICD-10-CM | POA: Diagnosis not present

## 2018-03-01 DIAGNOSIS — H40023 Open angle with borderline findings, high risk, bilateral: Secondary | ICD-10-CM | POA: Diagnosis not present

## 2018-03-06 DIAGNOSIS — F419 Anxiety disorder, unspecified: Secondary | ICD-10-CM | POA: Diagnosis not present

## 2018-03-06 DIAGNOSIS — Z6839 Body mass index (BMI) 39.0-39.9, adult: Secondary | ICD-10-CM | POA: Diagnosis not present

## 2018-03-06 DIAGNOSIS — Q078 Other specified congenital malformations of nervous system: Secondary | ICD-10-CM | POA: Diagnosis not present

## 2018-03-06 DIAGNOSIS — H35042 Retinal micro-aneurysms, unspecified, left eye: Secondary | ICD-10-CM | POA: Diagnosis not present

## 2018-03-06 DIAGNOSIS — K5730 Diverticulosis of large intestine without perforation or abscess without bleeding: Secondary | ICD-10-CM | POA: Diagnosis not present

## 2018-03-06 DIAGNOSIS — Z1331 Encounter for screening for depression: Secondary | ICD-10-CM | POA: Diagnosis not present

## 2018-03-06 DIAGNOSIS — Z0000 Encounter for general adult medical examination without abnormal findings: Secondary | ICD-10-CM | POA: Diagnosis not present

## 2018-03-06 DIAGNOSIS — I10 Essential (primary) hypertension: Secondary | ICD-10-CM | POA: Diagnosis not present

## 2018-03-06 DIAGNOSIS — I519 Heart disease, unspecified: Secondary | ICD-10-CM | POA: Diagnosis not present

## 2018-03-06 DIAGNOSIS — R9431 Abnormal electrocardiogram [ECG] [EKG]: Secondary | ICD-10-CM | POA: Diagnosis not present

## 2018-03-06 DIAGNOSIS — K219 Gastro-esophageal reflux disease without esophagitis: Secondary | ICD-10-CM | POA: Diagnosis not present

## 2018-03-06 DIAGNOSIS — E782 Mixed hyperlipidemia: Secondary | ICD-10-CM | POA: Diagnosis not present

## 2018-03-06 DIAGNOSIS — Z1212 Encounter for screening for malignant neoplasm of rectum: Secondary | ICD-10-CM | POA: Diagnosis not present

## 2018-03-12 ENCOUNTER — Ambulatory Visit (INDEPENDENT_AMBULATORY_CARE_PROVIDER_SITE_OTHER): Payer: Medicare Other | Admitting: Internal Medicine

## 2018-03-12 ENCOUNTER — Encounter: Payer: Self-pay | Admitting: Internal Medicine

## 2018-03-12 VITALS — BP 128/74 | HR 77 | Ht 68.0 in | Wt 250.6 lb

## 2018-03-12 DIAGNOSIS — I471 Supraventricular tachycardia: Secondary | ICD-10-CM

## 2018-03-12 NOTE — Patient Instructions (Signed)

## 2018-03-12 NOTE — Progress Notes (Signed)
Patient Care Team: [name], MD as PCP - General (Internal Medicine) Burnell Blanks, MD as PCP - Cardiology (Cardiology)   HPI  Bridget Cortez is a 73 y.o. female Seen in followup for [facility] which had ECG features of VT (concordance//aVR) but initiation and termination sequences suggestive of SVT--GT agreed more likely SVT  Rx with amlodipine>> metoprolol  Now taking bystolic and tolerating without difficulty; no interval tachy palpitations.  DATE TEST EF   8/16 Echo   55-65 %   7/19 Echo   55-60 %         Date Cr K Hgb  2/20 0.86 4.1 12.3         Shortness of breath is stable.  Edema is stable.  No chest pain.  She noted bloody discharge from her right nipple.  She has a history of breast cancer.  Records and Results Reviewed   Past Medical History:  Diagnosis Date  . Abnormal Pap smear 2006   vaginal medicine according to patient  . Allergy   . Arthritis   . Asthma   . 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 Breast cancer ([place]) 1999   Left  . CAP (community acquired pneumonia) 08/06/2014  . Cataract    cataracts lt. eye    cataract removed.  . Chiari malformation   . Diverticulosis   . GERD (gastroesophageal reflux disease)   . H/O osteopenia    08/2006  . H/O varicella   . Heart murmur   . Hypertension   . Monilial vaginitis 07/2007  . Multiple allergies   . Ovarian cyst   . Personal history of colonic adenomas 03/13/2012  . Personal history of radiation therapy 1990  . Pneumonia 07/2014  . 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 Yeast infection     Past Surgical History:  Procedure Laterality Date  . ABDOMINAL HYSTERECTOMY    . ABDOMINAL SURGERY    . BREAST LUMPECTOMY Left 1990  . BREAST SURGERY     Lumpectomy, left breast  . CATARACT EXTRACTION Left 2018  . COLONOSCOPY    . COLONOSCOPY W/ BIOPSIES    . HERNIA REPAIR    . KNEE SURGERY     right  . lt. eye aneurysm surgery Left 2017  . MOUTH SURGERY  2019   rt.  side gum surgery  Lt. done 3 years ago    Current Meds  Medication Sig  . acetaminophen (TYLENOL) 325 MG tablet Take 2 tablets (650 mg total) by mouth every 6 (six) hours as needed for mild pain, fever or headache (or Fever >/= 101).  Marland Kitchen albuterol (PROVENTIL) (5 MG/ML) 0.5% nebulizer solution Take 0.5 mLs (2.5 mg total) by nebulization every 6 (six) hours as needed for wheezing or shortness of breath.  Marland Kitchen amLODipine (NORVASC) 10 MG tablet Take 5 mg by mouth at bedtime.   Marland Kitchen aspirin EC 81 MG tablet Take 81 mg by mouth daily.  . Calcium Carbonate-Vitamin D (CALTRATE 600+D PO) Take 1 tablet by mouth daily with lunch.   . celecoxib (CELEBREX) 200 MG capsule Take 200 mg by mouth daily as needed (for arthritis-related pain).   . cetirizine (ZYRTEC) 10 MG tablet Take 10 mg by mouth daily.  . diazepam (VALIUM) 5 MG tablet Take 0.5 tablets by mouth as needed for sleep.  Marland Kitchen esomeprazole (NEXIUM) 40 MG capsule Take 40 mg by mouth daily before breakfast.  . fluticasone (FLONASE) 50 MCG/ACT nasal spray Place 1 spray  into both nostrils at bedtime.   . Fluticasone-Salmeterol (ADVAIR) 250-50 MCG/DOSE AEPB Inhale 1 puff into the lungs every 12 (twelve) hours.  Marland Kitchen losartan (COZAAR) 100 MG tablet Take 100 mg by mouth daily.  . Multiple Vitamin (MULTIVITAMIN) tablet Take 1 tablet by mouth daily with lunch.   . nebivolol (BYSTOLIC) 5 MG tablet Take 1 tablet (5 mg total) by mouth daily.  Vladimir Faster Glyc-Propyl Glyc PF (SYSTANE ULTRA PF) 0.4-0.3 % SOLN Place 1-2 drops into both eyes daily.  . polyethylene glycol powder (GLYCOLAX/MIRALAX) powder Take 17 g by mouth daily as needed for mild constipation.  . potassium chloride SA (K-DUR,KLOR-CON) 20 MEQ tablet Take 2 tablets (40 mEq total) by mouth daily.  . rosuvastatin (CRESTOR) 5 MG tablet Take 5 mg by mouth daily.  . simethicone (MYLICON) 423 MG chewable tablet Chew 125 mg by mouth as needed for flatulence.  . vitamin C (ASCORBIC ACID) 500 MG tablet Take 500 mg by  mouth daily.    Allergies  Allergen Reactions  . Ephedrine Shortness Of Breath  . Cortisone Other (See Comments)    Severe muscle spasms      Review of Systems negative except from HPI and PMH  Physical Exam BP 128/74   Pulse 77   Ht 5\' 8"  (1.727 m)   Wt 250 lb 9.6 oz (113.7 kg)   SpO2 97%   BMI 38.10 kg/m  Well developed and Morbidly obese in no acute distress HENT normal Neck supple with JVP-flat Clear Regular rate and rhythm, no gallop 2/6 murmur Abd-soft with active BS No Clubbing cyanosis 1+ edema Skin-warm and dry A & Oriented  Grossly normal sensory and motor function    ECG  Sinus 77 18/10/38 LVH repol   Assessment and  Plan  WCT probably SVT  HTN  Bloody discharge from nipple  HFpEF    Mildly volume overloaded.  Salt and water restriction encouraged  No interval tachypalpitations  Continue bisoprolol  Blood pressure well regulated  Seen in 6 months   Current medicines are reviewed at length with the patient today .  The patient does not  have concerns regarding medicines.

## 2018-03-13 ENCOUNTER — Other Ambulatory Visit: Payer: Self-pay | Admitting: Obstetrics and Gynecology

## 2018-03-13 DIAGNOSIS — N6452 Nipple discharge: Secondary | ICD-10-CM | POA: Diagnosis not present

## 2018-03-14 ENCOUNTER — Other Ambulatory Visit: Payer: Self-pay | Admitting: Obstetrics and Gynecology

## 2018-03-14 ENCOUNTER — Ambulatory Visit
Admission: RE | Admit: 2018-03-14 | Discharge: 2018-03-14 | Disposition: A | Discharge status: Home / Self Care | Payer: Medicare Other | Source: Ambulatory Visit | Attending: Obstetrics and Gynecology | Admitting: Obstetrics and Gynecology

## 2018-03-14 DIAGNOSIS — R928 Other abnormal and inconclusive findings on diagnostic imaging of breast: Secondary | ICD-10-CM | POA: Diagnosis not present

## 2018-03-14 DIAGNOSIS — N6313 Unspecified lump in the right breast, lower outer quadrant: Secondary | ICD-10-CM | POA: Diagnosis not present

## 2018-03-14 DIAGNOSIS — N6314 Unspecified lump in the right breast, lower inner quadrant: Secondary | ICD-10-CM | POA: Diagnosis not present

## 2018-03-14 DIAGNOSIS — N6310 Unspecified lump in the right breast, unspecified quadrant: Secondary | ICD-10-CM

## 2018-03-14 DIAGNOSIS — N6452 Nipple discharge: Secondary | ICD-10-CM

## 2018-03-19 ENCOUNTER — Other Ambulatory Visit: Payer: Self-pay | Admitting: Obstetrics and Gynecology

## 2018-03-19 ENCOUNTER — Ambulatory Visit
Admission: RE | Admit: 2018-03-19 | Discharge: 2018-03-19 | Disposition: A | Discharge status: Home / Self Care | Payer: Medicare Other | Source: Ambulatory Visit | Attending: Obstetrics and Gynecology | Admitting: Obstetrics and Gynecology

## 2018-03-19 DIAGNOSIS — N6310 Unspecified lump in the right breast, unspecified quadrant: Secondary | ICD-10-CM

## 2018-03-19 DIAGNOSIS — N6313 Unspecified lump in the right breast, lower outer quadrant: Secondary | ICD-10-CM | POA: Diagnosis not present

## 2018-03-19 DIAGNOSIS — R928 Other abnormal and inconclusive findings on diagnostic imaging of breast: Secondary | ICD-10-CM

## 2018-03-19 DIAGNOSIS — N6031 Fibrosclerosis of right breast: Secondary | ICD-10-CM | POA: Diagnosis not present

## 2018-03-22 ENCOUNTER — Other Ambulatory Visit: Payer: Self-pay | Admitting: General Surgery

## 2018-03-22 DIAGNOSIS — N6452 Nipple discharge: Secondary | ICD-10-CM | POA: Diagnosis not present

## 2018-03-25 ENCOUNTER — Other Ambulatory Visit: Payer: Self-pay | Admitting: General Surgery

## 2018-03-28 ENCOUNTER — Other Ambulatory Visit: Payer: Self-pay | Admitting: General Surgery

## 2018-03-28 DIAGNOSIS — N6452 Nipple discharge: Secondary | ICD-10-CM

## 2018-04-09 ENCOUNTER — Ambulatory Visit
Admission: RE | Admit: 2018-04-09 | Discharge: 2018-04-09 | Disposition: A | Discharge status: Home / Self Care | Payer: Medicare Other | Source: Ambulatory Visit | Attending: General Surgery | Admitting: General Surgery

## 2018-04-09 ENCOUNTER — Other Ambulatory Visit: Payer: Self-pay

## 2018-04-09 DIAGNOSIS — N6452 Nipple discharge: Secondary | ICD-10-CM

## 2018-04-09 DIAGNOSIS — Z853 Personal history of malignant neoplasm of breast: Secondary | ICD-10-CM | POA: Diagnosis not present

## 2018-04-09 MED ORDER — GADOBUTROL 1 MMOL/ML IV SOLN
10.0000 mL | Freq: Once | INTRAVENOUS | Status: AC | PRN
  Administered 2018-04-09: 10 mL via INTRAVENOUS

## 2018-04-23 ENCOUNTER — Other Ambulatory Visit: Payer: Self-pay | Admitting: General Surgery

## 2018-04-23 DIAGNOSIS — N6452 Nipple discharge: Secondary | ICD-10-CM

## 2018-04-24 NOTE — Pre-Procedure Instructions (Signed)
Bridget Cortez  04/24/2018      [place], [address] [place] [address] Phone: [phone] Fax: [phone]    Your procedure is scheduled on Tuesday, April 14.  Report to [facility] Admitting-Entrance A at 9:30 am.  Call this number if you have problems the morning of surgery:  [phone]   Remember:  Do not eat after midnight Monday.  You may drink clear liquids until 8:30 am.  Clear liquids allowed are:                    Water, Juice (non-citric and without pulp), Carbonated beverages, Clear Tea, Black Coffee only and Gatorade    Take these medicines the morning of surgery with A SIP OF WATER:  acetaminophen (TYLENOL) if needed albuterol (PROVENTIL) (5 MG/ML) 0.5% nebulizer solution if needed cetirizine (ZYRTEC) esomeprazole (NEXIUM)  Fluticasone-Salmeterol (ADVAIR)  nebivolol (BYSTOLIC) rosuvastatin (CRESTOR)  7 days prior to surgery STOP taking any Aspirin (unless otherwise instructed by your surgeon), Aleve, Naproxen, Ibuprofen, Motrin, Advil, Goody's, BC's, all herbal medications, fish oil, and all vitamins.  Follow your surgeon's instructions on when to stop Asprin.  If no instructions were given by your surgeon then you will need to call the office to get those instructions.     Do not wear jewelry, make-up or nail polish.  Do not wear lotions, powders, or perfumes, or deodorant.  Do not shave 48 hours prior to surgery.   Do not bring valuables to the hospital.  [facility] is not responsible for any belongings or valuables.  Contacts, dentures or bridgework may not be worn into surgery.  Leave your suitcase in the car.  After surgery it may be brought to your room.  For patients admitted to the hospital, discharge time will be determined by your treatment team.  Patients discharged the day of surgery will not be allowed to drive home.    Special instructions:   [facility]- Preparing For Surgery  Before surgery, you can play an important role. Because skin is not sterile, your skin needs to be as free of germs as possible. You can reduce the number of germs on your skin by washing with CHG (chlorahexidine gluconate) Soap before surgery.  CHG is an antiseptic cleaner which kills germs and bonds with the skin to continue killing germs even after washing.    Oral Hygiene is also important to reduce your risk of infection.  Remember - BRUSH YOUR TEETH THE MORNING OF SURGERY WITH YOUR REGULAR TOOTHPASTE  Please do not use if you have an allergy to CHG or antibacterial soaps. If your skin becomes reddened/irritated stop using the CHG.  Do not shave (including legs and underarms) for at least 48 hours prior to first CHG shower. It is OK to shave your face.  Please follow these instructions carefully.   1. Shower the NIGHT BEFORE SURGERY and the MORNING OF SURGERY with CHG.   2. If you chose to wash your hair, wash your hair first as usual with your normal shampoo.  3. After you shampoo, rinse your hair and body thoroughly to remove the shampoo.  4. Use CHG as you would any other liquid soap. You can apply CHG directly to the skin and wash gently with a scrungie or a clean washcloth.   5. Apply the CHG Soap to your body ONLY FROM THE NECK DOWN.  Do not use on open  wounds or open sores. Avoid contact with your eyes, ears, mouth and genitals (private parts). Wash Face and genitals (private parts)  with your normal soap.  6. Wash thoroughly, paying special attention to the area where your surgery will be performed.  7. Thoroughly rinse your body with warm water from the neck down.  8. DO NOT shower/wash with your normal soap after using and rinsing off the CHG Soap.  9. Pat yourself dry with a CLEAN TOWEL.  10. Wear CLEAN PAJAMAS to bed the night before surgery, wear comfortable clothes the morning of surgery  11. Place CLEAN SHEETS on your bed the night of  your first shower and DO NOT SLEEP WITH PETS.  Day of Surgery:  Do not apply any deodorants/lotions.  Please wear clean clothes to the hospital/surgery center.   Remember to brush your teeth WITH YOUR REGULAR TOOTHPASTE.   Please read over the following fact sheets that you were given.

## 2018-04-25 ENCOUNTER — Encounter (HOSPITAL_COMMUNITY): Payer: Self-pay

## 2018-04-25 ENCOUNTER — Other Ambulatory Visit: Payer: Self-pay

## 2018-04-25 ENCOUNTER — Encounter (HOSPITAL_COMMUNITY)
Admission: RE | Admit: 2018-04-25 | Discharge: 2018-04-25 | Disposition: A | Discharge status: Home / Self Care | Payer: Medicare Other | Source: Ambulatory Visit | Attending: General Surgery | Admitting: General Surgery

## 2018-04-25 DIAGNOSIS — Z01812 Encounter for preprocedural laboratory examination: Secondary | ICD-10-CM | POA: Insufficient documentation

## 2018-04-25 LAB — CBC
HCT: 41.2 % (ref 36.0–46.0)
Hemoglobin: 13.3 g/dL (ref 12.0–15.0)
MCH: 29 pg (ref 26.0–34.0)
MCHC: 32.3 g/dL (ref 30.0–36.0)
MCV: 90 fL (ref 80.0–100.0)
Platelets: 246 10*3/uL (ref 150–400)
RBC: 4.58 MIL/uL (ref 3.87–5.11)
RDW: 15.2 % (ref 11.5–15.5)
WBC: 4.9 10*3/uL (ref 4.0–10.5)
nRBC: 0 % (ref 0.0–0.2)

## 2018-04-25 LAB — BASIC METABOLIC PANEL
Anion gap: 6 (ref 5–15)
BUN: 8 mg/dL (ref 8–23)
CO2: 24 mmol/L (ref 22–32)
Calcium: 8.8 mg/dL — ABNORMAL LOW (ref 8.9–10.3)
Chloride: 103 mmol/L (ref 98–111)
Creatinine, Ser: 0.6 mg/dL (ref 0.44–1.00)
GFR calc Af Amer: >60 mL/min (ref >60)
GFR calc non Af Amer: >60 mL/min (ref >60)
Glucose, Bld: 83 mg/dL (ref 70–99)
Potassium: 3.7 mmol/L (ref 3.5–5.1)
Sodium: 133 mmol/L — ABNORMAL LOW (ref 135–145)

## 2018-04-25 MED ORDER — ENSURE PRE-SURGERY PO LIQD: 296.0000 mL | Freq: Once | ORAL | Status: DC

## 2018-04-25 NOTE — Progress Notes (Signed)
PCP - Dr. Richardson Landry Cardiologist - Dr. Caryl Comes  Chest x-ray - 02/23/2018 EKG -  Stress Test - denies ECHO - 07/25/17 Cardiac Cath - denies  Sleep Study - denies CPAP - N/A  Blood Thinner Instructions: N/A Aspirin Instructions: per pt. Surgeon instructed to stop 5 days prior surgery  Anesthesia review: No  Coronavirus Screening  Have you experienced the following symptoms:  Cough yes/no: No Fever (>100.27F)  yes/no: No Runny nose yes/no: No Sore throat yes/no: No Difficulty breathing/shortness of breath  yes/no: No  Have you or a family member traveled in the last 14 days and where? yes/no: No   If the patient indicates "YES" to the above questions, their PAT will be rescheduled to limit the exposure to others and, the surgeon will be notified. THE PATIENT WILL NEED TO BE ASYMPTOMATIC FOR 14 DAYS.   If the patient is not experiencing any of these symptoms, the PAT nurse will instruct them to NOT bring anyone with them to their appointment since they may have these symptoms or traveled as well.   Please remind your patients and families that hospital visitation restrictions are in effect and the importance of the restrictions.   Patient denies shortness of breath, fever, cough and chest pain at PAT appointment  Patient verbalized understanding of instructions that were given to them at the PAT appointment. Patient was also instructed that they will need to review over the PAT instructions again at home before surgery.

## 2018-04-29 NOTE — Anesthesia Preprocedure Evaluation (Addendum)
Anesthesia Evaluation  Patient identified by MRN, date of birth, ID band Patient awake    Reviewed: Allergy & Precautions, H&P , NPO status , Patient's Chart, lab work & pertinent test results  Airway Mallampati: II   Neck ROM: full    Dental   Pulmonary asthma , former smoker,    breath sounds clear to auscultation       Cardiovascular hypertension,  Rhythm:regular Rate:Normal     Neuro/Psych PSYCHIATRIC DISORDERS Anxiety    GI/Hepatic GERD  ,  Endo/Other    Renal/GU      Musculoskeletal  (+) Arthritis ,   Abdominal   Peds  Hematology   Anesthesia Other Findings   Reproductive/Obstetrics                            Anesthesia Physical Anesthesia Plan  ASA: II  Anesthesia Plan: General   Post-op Pain Management:    Induction: Intravenous  PONV Risk Score and Plan: 3 and Ondansetron, Dexamethasone and Treatment may vary due to age or medical condition  Airway Management Planned: LMA  Additional Equipment:   Intra-op Plan:   Post-operative Plan:   Informed Consent: I have reviewed the patients History and Physical, chart, labs and discussed the procedure including the risks, benefits and alternatives for the proposed anesthesia with the patient or authorized representative who has indicated his/her understanding and acceptance.       Plan Discussed with: CRNA, Anesthesiologist and Surgeon  Anesthesia Plan Comments: (Follows with cardiology for HFpEF, HTN, and WCT felt to be most likely SVT. Last seen 03/12/18, advised 6 mo followup.  Echo 08/04/2017 showed EF 55-60%, normal wall motion, mild MR, Mild posteriorly located pericardial effusion - also present on echo 2016   )     Anesthesia Quick Evaluation

## 2018-04-30 DIAGNOSIS — N6452 Nipple discharge: Secondary | ICD-10-CM | POA: Diagnosis not present

## 2018-05-14 ENCOUNTER — Encounter (INDEPENDENT_AMBULATORY_CARE_PROVIDER_SITE_OTHER): Payer: Medicare Other | Admitting: Ophthalmology

## 2018-06-04 ENCOUNTER — Encounter (INDEPENDENT_AMBULATORY_CARE_PROVIDER_SITE_OTHER): Payer: Medicare Other | Admitting: Ophthalmology

## 2018-06-04 ENCOUNTER — Other Ambulatory Visit: Payer: Self-pay

## 2018-06-04 DIAGNOSIS — I10 Essential (primary) hypertension: Secondary | ICD-10-CM | POA: Diagnosis not present

## 2018-06-04 DIAGNOSIS — H3509 Other intraretinal microvascular abnormalities: Secondary | ICD-10-CM

## 2018-06-04 DIAGNOSIS — H2511 Age-related nuclear cataract, right eye: Secondary | ICD-10-CM

## 2018-06-04 DIAGNOSIS — H35033 Hypertensive retinopathy, bilateral: Secondary | ICD-10-CM

## 2018-06-04 DIAGNOSIS — H43813 Vitreous degeneration, bilateral: Secondary | ICD-10-CM

## 2018-06-12 DIAGNOSIS — M1712 Unilateral primary osteoarthritis, left knee: Secondary | ICD-10-CM | POA: Diagnosis not present

## 2018-06-19 DIAGNOSIS — M1712 Unilateral primary osteoarthritis, left knee: Secondary | ICD-10-CM | POA: Diagnosis not present

## 2018-06-26 DIAGNOSIS — M1712 Unilateral primary osteoarthritis, left knee: Secondary | ICD-10-CM | POA: Diagnosis not present

## 2018-07-01 ENCOUNTER — Other Ambulatory Visit: Payer: Self-pay | Admitting: General Surgery

## 2018-07-01 DIAGNOSIS — N6452 Nipple discharge: Secondary | ICD-10-CM

## 2018-07-01 NOTE — Progress Notes (Signed)
[place] [place] [address] Phone: [phone] Fax: [phone]      Your procedure is scheduled on June 16th.  Report to [facility] Main Entrance "A" at 5:30 A.M., and check in at the Admitting office.  Call this number if you have problems the morning of surgery:  [phone]  Call [phone] if you have any questions prior to your surgery date Monday-Friday 8am-4pm    Remember:  Do not eat or drink after midnight.     Take these medicines the morning of surgery with A SIP OF WATER   Tylenol - if needed  Albuterol Nebulizer - if needed  Amlodipine (Norvasc)  Nexium  Flonase nasal spray  Nebivolol (Bystolic)  Rosuvastatin (Crestor)  Follow your surgeon's instructions on when to stop Aspirin.  If no instructions were given by your surgeon then you will need to call the office to get those instructions.    7 days prior to surgery STOP taking any Aleve, Naproxen, Ibuprofen, Motrin, Advil, Goody's, BC's, all herbal medications, fish oil, and all vitamins.    The Morning of Surgery  Do not wear jewelry, make-up or nail polish.  Do not wear lotions, powders, or perfumes, or deodorant  Do not shave 48 hours prior to surgery.    Do not bring valuables to the hospital.  [facility] is not responsible for any belongings or valuables.  If you are a smoker, DO NOT Smoke 24 hours prior to surgery IF you wear a CPAP at night please bring your mask, tubing, and machine the morning of surgery   Remember that you must have someone to transport you home after your surgery, and remain with you for 24 hours if you are discharged the same day.   Contacts, glasses, hearing aids, dentures or bridgework may not be worn into surgery.    Leave your suitcase in the car.  After surgery it may be brought to your room.  For patients admitted to the hospital, discharge time will be determined by your  treatment team.  Patients discharged the day of surgery will not be allowed to drive home.    Special instructions:   [facility]- Preparing For Surgery  Before surgery, you can play an important role. Because skin is not sterile, your skin needs to be as free of germs as possible. You can reduce the number of germs on your skin by washing with CHG (chlorahexidine gluconate) Soap before surgery.  CHG is an antiseptic cleaner which kills germs and bonds with the skin to continue killing germs even after washing.    Oral Hygiene is also important to reduce your risk of infection.  Remember - BRUSH YOUR TEETH THE MORNING OF SURGERY WITH YOUR REGULAR TOOTHPASTE  Please do not use if you have an allergy to CHG or antibacterial soaps. If your skin becomes reddened/irritated stop using the CHG.  Do not shave (including legs and underarms) for at least 48 hours prior to first CHG shower. It is OK to shave your face.  Please follow these instructions carefully.   1. Shower the NIGHT BEFORE SURGERY and the MORNING OF SURGERY with CHG Soap.   2. If you chose to wash your hair, wash your hair first as usual with your normal shampoo.  3. After you shampoo, rinse your hair and body thoroughly to remove the shampoo.  4. Use CHG as you would any other liquid soap. You can  apply CHG directly to the skin and wash gently with a scrungie or a clean washcloth.   5. Apply the CHG Soap to your body ONLY FROM THE NECK DOWN.  Do not use on open wounds or open sores. Avoid contact with your eyes, ears, mouth and genitals (private parts). Wash Face and genitals (private parts)  with your normal soap.   6. Wash thoroughly, paying special attention to the area where your surgery will be performed.  7. Thoroughly rinse your body with warm water from the neck down.  8. DO NOT shower/wash with your normal soap after using and rinsing off the CHG Soap.  9. Pat yourself dry with a CLEAN TOWEL.  10. Wear CLEAN  PAJAMAS to bed the night before surgery, wear comfortable clothes the morning of surgery  11. Place CLEAN SHEETS on your bed the night of your first shower and DO NOT SLEEP WITH PETS.    Day of Surgery:  Do not apply any deodorants/lotions.  Please wear clean clothes to the hospital/surgery center.   Remember to brush your teeth WITH YOUR REGULAR TOOTHPASTE.   Please read over the following fact sheets that you were given.

## 2018-07-02 ENCOUNTER — Encounter (HOSPITAL_COMMUNITY)
Admission: RE | Admit: 2018-07-02 | Discharge: 2018-07-02 | Disposition: A | Discharge status: Home / Self Care | Payer: Medicare Other | Source: Ambulatory Visit | Attending: General Surgery | Admitting: General Surgery

## 2018-07-02 ENCOUNTER — Encounter (HOSPITAL_COMMUNITY): Payer: Self-pay

## 2018-07-02 ENCOUNTER — Telehealth: Payer: Self-pay | Admitting: Internal Medicine

## 2018-07-02 ENCOUNTER — Other Ambulatory Visit: Payer: Self-pay

## 2018-07-02 DIAGNOSIS — Z1159 Encounter for screening for other viral diseases: Secondary | ICD-10-CM | POA: Insufficient documentation

## 2018-07-02 DIAGNOSIS — Z01812 Encounter for preprocedural laboratory examination: Secondary | ICD-10-CM | POA: Insufficient documentation

## 2018-07-02 LAB — BASIC METABOLIC PANEL
Anion gap: 9 (ref 5–15)
BUN: 7 mg/dL — ABNORMAL LOW (ref 8–23)
CO2: 22 mmol/L (ref 22–32)
Calcium: 9 mg/dL (ref 8.9–10.3)
Chloride: 103 mmol/L (ref 98–111)
Creatinine, Ser: 0.62 mg/dL (ref 0.44–1.00)
GFR calc Af Amer: >60 mL/min (ref >60)
GFR calc non Af Amer: >60 mL/min (ref >60)
Glucose, Bld: 93 mg/dL (ref 70–99)
Potassium: 3.8 mmol/L (ref 3.5–5.1)
Sodium: 134 mmol/L — ABNORMAL LOW (ref 135–145)

## 2018-07-02 LAB — CBC
HCT: 40.8 % (ref 36.0–46.0)
Hemoglobin: 12.9 g/dL (ref 12.0–15.0)
MCH: 28.3 pg (ref 26.0–34.0)
MCHC: 31.6 g/dL (ref 30.0–36.0)
MCV: 89.5 fL (ref 80.0–100.0)
Platelets: 234 10*3/uL (ref 150–400)
RBC: 4.56 MIL/uL (ref 3.87–5.11)
RDW: 14.3 % (ref 11.5–15.5)
WBC: 3.5 10*3/uL — ABNORMAL LOW (ref 4.0–10.5)
nRBC: 0 % (ref 0.0–0.2)

## 2018-07-02 MED ORDER — CHLORHEXIDINE GLUCONATE CLOTH 2 % EX PADS: 6.0000 | MEDICATED_PAD | Freq: Once | CUTANEOUS | Status: DC

## 2018-07-02 NOTE — Progress Notes (Signed)
PCP - dr Dagmar Hait Cardiologist - Dr Caryl Comes  Chest x-ray - 2/20 EKG - 2/20 ECHO - 7/19    -       Aspirin Instructions:stop 5 days  Anesthesia review: reviews ekg Patient denies shortness of breath, fever, cough and chest pain at PAT appointment   Patient verbalized understanding of instructions that were given to them at the PAT appointment. Patient was also instructed that they will need to review over the PAT instructions again at home before surgery.

## 2018-07-02 NOTE — Telephone Encounter (Signed)
Patient is calling to let Dr Caryl Comes know that she is having a biopsy of right breast at [facility] on 07/09/18. She states Dr Caryl Comes told her to let him know when it was to be done.

## 2018-07-04 NOTE — Progress Notes (Signed)
Spoke with Bridget Cortez at Dr. Marlowe Aschoff office.  Confirmed with her patient is not to stop potassium.  Also discussed the patient's celebrex.  Per anesthesia it is recommended to hold celebrex 5-7 days prior to surgery unless otherwise instructed by your physician.  Dr. Barry Dienes instructed patient she did not have to stop her celebrex.

## 2018-07-05 ENCOUNTER — Other Ambulatory Visit (HOSPITAL_COMMUNITY)
Admission: RE | Admit: 2018-07-05 | Discharge: 2018-07-05 | Disposition: A | Discharge status: Home / Self Care | Payer: Medicare Other | Source: Ambulatory Visit | Attending: General Surgery | Admitting: General Surgery

## 2018-07-05 DIAGNOSIS — Z01812 Encounter for preprocedural laboratory examination: Secondary | ICD-10-CM | POA: Diagnosis not present

## 2018-07-05 DIAGNOSIS — Z1159 Encounter for screening for other viral diseases: Secondary | ICD-10-CM | POA: Diagnosis not present

## 2018-07-06 LAB — NOVEL CORONAVIRUS, NAA (HOSP ORDER, SEND-OUT TO REF LAB; TAT 18-24 HRS): SARS-CoV-2, NAA: NOT DETECTED

## 2018-07-08 ENCOUNTER — Other Ambulatory Visit: Payer: Self-pay

## 2018-07-08 ENCOUNTER — Ambulatory Visit
Admission: RE | Admit: 2018-07-08 | Discharge: 2018-07-08 | Disposition: A | Discharge status: Home / Self Care | Payer: Medicare Other | Source: Ambulatory Visit | Attending: General Surgery | Admitting: General Surgery

## 2018-07-08 DIAGNOSIS — R928 Other abnormal and inconclusive findings on diagnostic imaging of breast: Secondary | ICD-10-CM | POA: Diagnosis not present

## 2018-07-08 DIAGNOSIS — N6452 Nipple discharge: Secondary | ICD-10-CM

## 2018-07-09 ENCOUNTER — Encounter (HOSPITAL_COMMUNITY)
Admission: RE | Disposition: A | Discharge status: Home / Self Care | Payer: Self-pay | Source: Home / Self Care | Attending: General Surgery

## 2018-07-09 ENCOUNTER — Ambulatory Visit (HOSPITAL_COMMUNITY)
Admission: RE | Admit: 2018-07-09 | Discharge: 2018-07-09 | Disposition: A | Discharge status: Home / Self Care | Payer: Medicare Other | Attending: General Surgery | Admitting: General Surgery

## 2018-07-09 ENCOUNTER — Ambulatory Visit
Admission: RE | Admit: 2018-07-09 | Discharge: 2018-07-09 | Disposition: A | Discharge status: Home / Self Care | Payer: Medicare Other | Source: Ambulatory Visit | Attending: General Surgery | Admitting: General Surgery

## 2018-07-09 ENCOUNTER — Ambulatory Visit (HOSPITAL_COMMUNITY): Payer: Medicare Other | Admitting: Anesthesiology

## 2018-07-09 ENCOUNTER — Ambulatory Visit (HOSPITAL_COMMUNITY): Payer: Medicare Other | Admitting: Vascular Surgery

## 2018-07-09 DIAGNOSIS — R921 Mammographic calcification found on diagnostic imaging of breast: Secondary | ICD-10-CM | POA: Diagnosis not present

## 2018-07-09 DIAGNOSIS — Z79899 Other long term (current) drug therapy: Secondary | ICD-10-CM | POA: Insufficient documentation

## 2018-07-09 DIAGNOSIS — Z791 Long term (current) use of non-steroidal anti-inflammatories (NSAID): Secondary | ICD-10-CM | POA: Diagnosis not present

## 2018-07-09 DIAGNOSIS — I471 Supraventricular tachycardia: Secondary | ICD-10-CM | POA: Diagnosis not present

## 2018-07-09 DIAGNOSIS — Z923 Personal history of irradiation: Secondary | ICD-10-CM | POA: Diagnosis not present

## 2018-07-09 DIAGNOSIS — R928 Other abnormal and inconclusive findings on diagnostic imaging of breast: Secondary | ICD-10-CM | POA: Diagnosis not present

## 2018-07-09 DIAGNOSIS — Z803 Family history of malignant neoplasm of breast: Secondary | ICD-10-CM | POA: Insufficient documentation

## 2018-07-09 DIAGNOSIS — N6041 Mammary duct ectasia of right breast: Secondary | ICD-10-CM | POA: Insufficient documentation

## 2018-07-09 DIAGNOSIS — Z7982 Long term (current) use of aspirin: Secondary | ICD-10-CM | POA: Insufficient documentation

## 2018-07-09 DIAGNOSIS — Z853 Personal history of malignant neoplasm of breast: Secondary | ICD-10-CM | POA: Diagnosis not present

## 2018-07-09 DIAGNOSIS — J45909 Unspecified asthma, uncomplicated: Secondary | ICD-10-CM | POA: Diagnosis not present

## 2018-07-09 DIAGNOSIS — N6011 Diffuse cystic mastopathy of right breast: Secondary | ICD-10-CM | POA: Diagnosis not present

## 2018-07-09 DIAGNOSIS — N649 Disorder of breast, unspecified: Secondary | ICD-10-CM | POA: Diagnosis present

## 2018-07-09 DIAGNOSIS — I10 Essential (primary) hypertension: Secondary | ICD-10-CM | POA: Diagnosis not present

## 2018-07-09 DIAGNOSIS — N6452 Nipple discharge: Secondary | ICD-10-CM | POA: Diagnosis not present

## 2018-07-09 HISTORY — PX: RADIOACTIVE SEED GUIDED EXCISIONAL BREAST BIOPSY: SHX6490

## 2018-07-09 SURGERY — RADIOACTIVE SEED GUIDED BREAST BIOPSY
Anesthesia: General | Site: Breast | Laterality: Right

## 2018-07-09 MED ORDER — LACTATED RINGERS IV SOLN
INTRAVENOUS | Status: DC | PRN
  Administered 2018-07-09: 08:00:00 via INTRAVENOUS

## 2018-07-09 MED ORDER — ONDANSETRON HCL 4 MG/2ML IJ SOLN
INTRAMUSCULAR | Status: AC
  Filled 2018-07-09: qty 2

## 2018-07-09 MED ORDER — CEFAZOLIN SODIUM-DEXTROSE 2-4 GM/100ML-% IV SOLN
2.0000 g | INTRAVENOUS | Status: AC
  Administered 2018-07-09: 2 g via INTRAVENOUS
  Filled 2018-07-09: qty 100

## 2018-07-09 MED ORDER — PROPOFOL 10 MG/ML IV BOLUS
INTRAVENOUS | Status: DC | PRN
  Administered 2018-07-09: 150 mg via INTRAVENOUS

## 2018-07-09 MED ORDER — DEXAMETHASONE SODIUM PHOSPHATE 10 MG/ML IJ SOLN
INTRAMUSCULAR | Status: AC
  Filled 2018-07-09: qty 1

## 2018-07-09 MED ORDER — 0.9 % SODIUM CHLORIDE (POUR BTL) OPTIME
TOPICAL | Status: DC | PRN
  Administered 2018-07-09: 1000 mL

## 2018-07-09 MED ORDER — DEXAMETHASONE SODIUM PHOSPHATE 10 MG/ML IJ SOLN
INTRAMUSCULAR | Status: DC | PRN
  Administered 2018-07-09: 10 mg via INTRAVENOUS

## 2018-07-09 MED ORDER — MIDAZOLAM HCL 5 MG/5ML IJ SOLN
INTRAMUSCULAR | Status: DC | PRN
  Administered 2018-07-09: 1 mg via INTRAVENOUS

## 2018-07-09 MED ORDER — LIDOCAINE 2% (20 MG/ML) 5 ML SYRINGE
INTRAMUSCULAR | Status: DC | PRN
  Administered 2018-07-09: 60 mg via INTRAVENOUS

## 2018-07-09 MED ORDER — LIDOCAINE HCL 1 % IJ SOLN
INTRAMUSCULAR | Status: DC | PRN
  Administered 2018-07-09: 30 mL via INTRAMUSCULAR

## 2018-07-09 MED ORDER — PROPOFOL 10 MG/ML IV BOLUS
INTRAVENOUS | Status: AC
  Filled 2018-07-09: qty 20

## 2018-07-09 MED ORDER — OXYCODONE HCL 5 MG PO TABS: 5.0000 mg | ORAL_TABLET | Freq: Four times a day (QID) | ORAL | 0 refills | Status: DC | PRN

## 2018-07-09 MED ORDER — ACETAMINOPHEN 500 MG PO TABS
1000.0000 mg | ORAL_TABLET | ORAL | Status: AC
  Administered 2018-07-09: 1000 mg via ORAL
  Filled 2018-07-09: qty 2

## 2018-07-09 MED ORDER — MIDAZOLAM HCL 2 MG/2ML IJ SOLN
INTRAMUSCULAR | Status: AC
  Filled 2018-07-09: qty 2

## 2018-07-09 MED ORDER — PHENYLEPHRINE 40 MCG/ML (10ML) SYRINGE FOR IV PUSH (FOR BLOOD PRESSURE SUPPORT)
PREFILLED_SYRINGE | INTRAVENOUS | Status: DC | PRN
  Administered 2018-07-09: 80 ug via INTRAVENOUS

## 2018-07-09 MED ORDER — OXYCODONE HCL 5 MG/5ML PO SOLN: 5.0000 mg | Freq: Once | ORAL | Status: AC | PRN

## 2018-07-09 MED ORDER — FENTANYL CITRATE (PF) 250 MCG/5ML IJ SOLN
INTRAMUSCULAR | Status: AC
  Filled 2018-07-09: qty 5

## 2018-07-09 MED ORDER — BUPIVACAINE-EPINEPHRINE (PF) 0.25% -1:200000 IJ SOLN
INTRAMUSCULAR | Status: AC
  Filled 2018-07-09: qty 30

## 2018-07-09 MED ORDER — ONDANSETRON HCL 4 MG/2ML IJ SOLN: 4.0000 mg | Freq: Four times a day (QID) | INTRAMUSCULAR | Status: DC | PRN

## 2018-07-09 MED ORDER — OXYCODONE HCL 5 MG PO TABS
ORAL_TABLET | ORAL | Status: AC
  Filled 2018-07-09: qty 1

## 2018-07-09 MED ORDER — GABAPENTIN 100 MG PO CAPS
100.0000 mg | ORAL_CAPSULE | ORAL | Status: AC
  Administered 2018-07-09: 100 mg via ORAL
  Filled 2018-07-09: qty 1

## 2018-07-09 MED ORDER — LIDOCAINE 2% (20 MG/ML) 5 ML SYRINGE
INTRAMUSCULAR | Status: AC
  Filled 2018-07-09: qty 5

## 2018-07-09 MED ORDER — LIDOCAINE HCL (PF) 1 % IJ SOLN
INTRAMUSCULAR | Status: AC
  Filled 2018-07-09: qty 30

## 2018-07-09 MED ORDER — PHENYLEPHRINE 40 MCG/ML (10ML) SYRINGE FOR IV PUSH (FOR BLOOD PRESSURE SUPPORT)
PREFILLED_SYRINGE | INTRAVENOUS | Status: AC
  Filled 2018-07-09: qty 10

## 2018-07-09 MED ORDER — FENTANYL CITRATE (PF) 100 MCG/2ML IJ SOLN: 25.0000 ug | INTRAMUSCULAR | Status: DC | PRN

## 2018-07-09 MED ORDER — OXYCODONE HCL 5 MG PO TABS
5.0000 mg | ORAL_TABLET | Freq: Once | ORAL | Status: AC | PRN
  Administered 2018-07-09: 5 mg via ORAL

## 2018-07-09 MED ORDER — ONDANSETRON HCL 4 MG/2ML IJ SOLN
INTRAMUSCULAR | Status: DC | PRN
  Administered 2018-07-09: 4 mg via INTRAVENOUS

## 2018-07-09 MED ORDER — FENTANYL CITRATE (PF) 100 MCG/2ML IJ SOLN
INTRAMUSCULAR | Status: DC | PRN
  Administered 2018-07-09 (×4): 25 ug via INTRAVENOUS
  Administered 2018-07-09: 50 ug via INTRAVENOUS

## 2018-07-09 SURGICAL SUPPLY — 46 items
ADH SKN CLS APL DERMABOND .7 (GAUZE/BANDAGES/DRESSINGS) ×1
APPLIER CLIP 9.375 MED OPEN (MISCELLANEOUS)
APR CLP MED 9.3 20 MLT OPN (MISCELLANEOUS)
BINDER BREAST LRG (GAUZE/BANDAGES/DRESSINGS) IMPLANT
BINDER BREAST XLRG (GAUZE/BANDAGES/DRESSINGS) ×2 IMPLANT
BLADE SURG 10 STRL SS (BLADE) ×3 IMPLANT
CANISTER SUCT 3000ML PPV (MISCELLANEOUS) ×3 IMPLANT
CHLORAPREP W/TINT 26ML (MISCELLANEOUS) ×3 IMPLANT
CLIP APPLIE 9.375 MED OPEN (MISCELLANEOUS) IMPLANT
CLIP VESOCCLUDE LG 6/CT (CLIP) IMPLANT
CLOSURE WOUND 1/2 X4 (GAUZE/BANDAGES/DRESSINGS) ×1
COVER PROBE W GEL 5X96 (DRAPES) ×3 IMPLANT
COVER SURGICAL LIGHT HANDLE (MISCELLANEOUS) ×3 IMPLANT
COVER WAND RF STERILE (DRAPES) ×1 IMPLANT
DERMABOND ADVANCED (GAUZE/BANDAGES/DRESSINGS) ×2
DERMABOND ADVANCED .7 DNX12 (GAUZE/BANDAGES/DRESSINGS) ×1 IMPLANT
DEVICE DUBIN SPECIMEN MAMMOGRA (MISCELLANEOUS) ×3 IMPLANT
DRAPE CHEST BREAST 15X10 FENES (DRAPES) ×3 IMPLANT
DRSG PAD ABDOMINAL 8X10 ST (GAUZE/BANDAGES/DRESSINGS) ×3 IMPLANT
ELECT CAUTERY BLADE 6.4 (BLADE) ×3 IMPLANT
ELECT REM PT RETURN 9FT ADLT (ELECTROSURGICAL) ×3
ELECTRODE REM PT RTRN 9FT ADLT (ELECTROSURGICAL) ×1 IMPLANT
GAUZE SPONGE 4X4 12PLY STRL LF (GAUZE/BANDAGES/DRESSINGS) ×2 IMPLANT
GLOVE BIO SURGEON STRL SZ 6 (GLOVE) ×3 IMPLANT
GLOVE INDICATOR 6.5 STRL GRN (GLOVE) ×3 IMPLANT
GOWN STRL REUS W/ TWL LRG LVL3 (GOWN DISPOSABLE) ×1 IMPLANT
GOWN STRL REUS W/TWL 2XL LVL3 (GOWN DISPOSABLE) ×3 IMPLANT
GOWN STRL REUS W/TWL LRG LVL3 (GOWN DISPOSABLE) ×3
ILLUMINATOR WAVEGUIDE N/F (MISCELLANEOUS) IMPLANT
KIT BASIN OR (CUSTOM PROCEDURE TRAY) ×3 IMPLANT
KIT MARKER MARGIN INK (KITS) ×3 IMPLANT
LIGHT WAVEGUIDE WIDE FLAT (MISCELLANEOUS) IMPLANT
NDL HYPO 25GX1X1/2 BEV (NEEDLE) ×1 IMPLANT
NEEDLE HYPO 25GX1X1/2 BEV (NEEDLE) IMPLANT
NS IRRIG 1000ML POUR BTL (IV SOLUTION) ×3 IMPLANT
PACK GENERAL/GYN (CUSTOM PROCEDURE TRAY) ×3 IMPLANT
PAD ABD 7.5X8 STRL (GAUZE/BANDAGES/DRESSINGS) ×2 IMPLANT
STRIP CLOSURE SKIN 1/2X4 (GAUZE/BANDAGES/DRESSINGS) ×2 IMPLANT
SUT MNCRL AB 4-0 PS2 18 (SUTURE) ×3 IMPLANT
SUT VIC AB 2-0 SH 27 (SUTURE) ×3
SUT VIC AB 2-0 SH 27XBRD (SUTURE) ×1 IMPLANT
SUT VIC AB 3-0 SH 27 (SUTURE) ×3
SUT VIC AB 3-0 SH 27X BRD (SUTURE) ×1 IMPLANT
SYR CONTROL 10ML LL (SYRINGE) ×3 IMPLANT
TOWEL OR 17X24 6PK STRL BLUE (TOWEL DISPOSABLE) ×3 IMPLANT
TOWEL OR 17X26 10 PK STRL BLUE (TOWEL DISPOSABLE) ×1 IMPLANT

## 2018-07-09 NOTE — Transfer of Care (Signed)
Immediate Anesthesia Transfer of Care Note  Patient: Bridget Cortez  Procedure(s) Performed: RADIOACTIVE SEED GUIDED EXCISIONAL RIGHT  BREAST BIOPSY (Right Breast)  Patient Location: PACU  Anesthesia Type:General  Level of Consciousness: awake and patient cooperative  Airway & Oxygen Therapy: Patient Spontanous Breathing  Post-op Assessment: Report given to RN and Post -op Vital signs reviewed and stable  Post vital signs: Reviewed and stable  Last Vitals:  Vitals Value Taken Time  BP 134/71 07/09/18 0854  Temp    Pulse 79 07/09/18 0854  Resp    SpO2 97 % 07/09/18 0854  Vitals shown include unvalidated device data.  Last Pain:  Vitals:   07/09/18 0618  TempSrc: Oral         Complications: No apparent anesthesia complications

## 2018-07-09 NOTE — Anesthesia Procedure Notes (Signed)
Procedure Name: LMA Insertion Date/Time: 07/09/2018 7:41 AM Performed by: Lance Coon, CRNA Pre-anesthesia Checklist: Patient identified, Emergency Drugs available, Suction available, Patient being monitored and Timeout performed Patient Re-evaluated:Patient Re-evaluated prior to induction Oxygen Delivery Method: Circle system utilized Preoxygenation: Pre-oxygenation with 100% oxygen Induction Type: IV induction LMA: LMA inserted LMA Size: 4.0 Number of attempts: 1 Placement Confirmation: positive ETCO2 and breath sounds checked- equal and bilateral Tube secured with: Tape Dental Injury: Teeth and Oropharynx as per pre-operative assessment

## 2018-07-09 NOTE — Interval H&P Note (Signed)
History and Physical Interval Note:  07/09/2018 7:23 AM  Bridget Cortez  has presented today for surgery, with the diagnosis of BLOODY NIPPLE DISCHARGE.  The various methods of treatment have been discussed with the patient and family. After consideration of risks, benefits and other options for treatment, the patient has consented to  Procedure(s): RADIOACTIVE SEED GUIDED EXCISIONAL RIGHT  BREAST BIOPSY (Right) as a surgical intervention.  The patient's history has been reviewed, patient examined, no change in status, stable for surgery.  I have reviewed the patient's chart and labs.  Questions were answered to the patient's satisfaction.     Stark Klein

## 2018-07-09 NOTE — Discharge Instructions (Addendum)
Start home medications as scheduled today other than vitamins which should start tomorrow.    [place] Office Phone Number [phone]  BREAST BIOPSY/ PARTIAL MASTECTOMY: POST OP INSTRUCTIONS  Always review your discharge instruction sheet given to you by the facility where your surgery was performed.  IF YOU HAVE DISABILITY OR FAMILY LEAVE FORMS, YOU MUST BRING THEM TO THE OFFICE FOR PROCESSING.  DO NOT GIVE THEM TO YOUR DOCTOR.  1. A prescription for pain medication may be given to you upon discharge.  Take your pain medication as prescribed, if needed.  If narcotic pain medicine is not needed, then you may take acetaminophen (Tylenol) or ibuprofen (Advil) as needed. 2. Take your usually prescribed medications unless otherwise directed 3. If you need a refill on your pain medication, please contact your pharmacy.  They will contact our office to request authorization.  Prescriptions will not be filled after 5pm or on week-ends. 4. You should eat very light the first 24 hours after surgery, such as soup, crackers, pudding, etc.  Resume your normal diet the day after surgery. 5. Most patients will experience some swelling and bruising in the breast.  Ice packs will help.  Swelling and bruising can take several days to resolve.  6. It is common to experience some constipation if taking pain medication after surgery.  Increasing fluid intake and taking a stool softener will usually help or prevent this problem from occurring.  A mild laxative (Milk of Magnesia or Miralax) should be taken according to package directions if there are no bowel movements after 48 hours. 7. Unless discharge instructions indicate otherwise, you may remove your gauze bandages 48 hours after surgery, and you may shower at that time.  You may have steri-strips (small skin tapes) in place directly over the incision.  These strips should be left on the skin for at least 7-10 days.   Any sutures or staples  will be removed at the office during your follow-up visit. 8. ACTIVITIES:  You may resume regular daily activities (gradually increasing) beginning the next day.  Wearing a good support bra or sports bra (or the breast binder) minimizes pain and swelling.  Wear the binder OR a support bra for 1 week other than when you shower.  You may have sexual intercourse when it is comfortable. a. You may drive when you no longer are taking prescription pain medication, you can comfortably wear a seatbelt, and you can safely maneuver your car and apply brakes. b. RETURN TO WORK:  __________1 week_______________ 9. You should see your doctor in the office for a follow-up appointment approximately two weeks after your surgery.  Your doctors nurse will typically make your follow-up appointment when she calls you with your pathology report.  Expect your pathology report 2-3 business days after your surgery.  You may call to check if you do not hear from [place] after three days.   WHEN TO CALL YOUR DOCTOR: 1. Fever over 101.0 2. Nausea and/or vomiting. 3. Extreme swelling or bruising. 4. Continued bleeding from incision. 5. Increased pain, redness, or drainage from the incision.  The clinic staff is available to answer your questions during regular business hours.  Please dont hesitate to call and ask to speak to one of the nurses for clinical concerns.  If you have a medical emergency, go to the nearest emergency room or call 911.  A surgeon from [facility] is always on call at the hospital.  For further questions, please visit  [email or web address]    Post Anesthesia Home Care Instructions  Activity: Get plenty of rest for the remainder of the day. A responsible individual must stay with you for 24 hours following the procedure.  For the next 24 hours, DO NOT: -Drive a car -Paediatric nurse -Drink alcoholic beverages -Take any medication unless instructed by your physician -Make any  legal decisions or sign important papers.  Meals: Start with liquid foods such as gelatin or soup. Progress to regular foods as tolerated. Avoid greasy, spicy, heavy foods. If nausea and/or vomiting occur, drink only clear liquids until the nausea and/or vomiting subsides. Call your physician if vomiting continues.  Special Instructions/Symptoms: Your throat may feel dry or sore from the anesthesia or the breathing tube placed in your throat during surgery. If this causes discomfort, gargle with warm salt water. The discomfort should disappear within 24 hours.  If you had a scopolamine patch placed behind your ear for the management of post- operative nausea and/or vomiting:  1. The medication in the patch is effective for 72 hours, after which it should be removed.  Wrap patch in a tissue and discard in the trash. Wash hands thoroughly with soap and water. 2. You may remove the patch earlier than 72 hours if you experience unpleasant side effects which may include dry mouth, dizziness or visual disturbances. 3. Avoid touching the patch. Wash your hands with soap and water after contact with the patch.

## 2018-07-09 NOTE — Anesthesia Postprocedure Evaluation (Signed)
Anesthesia Post Note  Patient: Bridget Cortez  Procedure(s) Performed: RADIOACTIVE SEED GUIDED EXCISIONAL RIGHT  BREAST BIOPSY (Right Breast)     Patient location during evaluation: PACU Anesthesia Type: General Level of consciousness: awake and alert Pain management: pain level controlled Vital Signs Assessment: post-procedure vital signs reviewed and stable Respiratory status: spontaneous breathing, nonlabored ventilation, respiratory function stable and patient connected to nasal cannula oxygen Cardiovascular status: blood pressure returned to baseline and stable Postop Assessment: no apparent nausea or vomiting Anesthetic complications: no    Last Vitals:  Vitals:   07/09/18 0908 07/09/18 0923  BP: (!) 131/91 131/71  Pulse: 63 67  Resp: 14   Temp:    SpO2: 100% 99%    Last Pain:  Vitals:   07/09/18 0923  TempSrc:   PainSc: 0-No pain                 Trellis Guirguis S

## 2018-07-09 NOTE — Op Note (Signed)
Right Breast Radioactive seed localized excisional biopsy  Indications: This patient presents with history of right bloody nipple discharge with discordant core needle biopsy  Pre-operative Diagnosis: right bloody nipple discharge  Post-operative Diagnosis: Same  Surgeon: Stark Klein   Anesthesia: General endotracheal anesthesia  ASA Class: 2  Procedure Details  The patient was seen in the Holding Room. The risks, benefits, complications, treatment options, and expected outcomes were discussed with the patient. The possibilities of bleeding, infection, the need for additional procedures, failure to diagnose a condition, and creating a complication requiring transfusion or operation were discussed with the patient. The patient concurred with the proposed plan, giving informed consent.  The site of surgery properly noted/marked. The patient was taken to Operating Room # 9, identified, and the procedure verified as right Breast seed localized excisional biopsy. A Time Out was held and the above information confirmed.  The right breast and chest were prepped and draped in standard fashion. The biopsy was performed by creating an inferolateral circumareolar incision near the previously placed radioactive seed.  Dissection was carried down to around the point of maximum signal intensity. The seed was very superficial and came out separately on the probe.  The cautery was used to perform the dissection.  Hemostasis was achieved with cautery. The cavity was marked with one large clip.   The specimen was inked with the margin marker paint kit.    Specimen radiography confirmed inclusion of the mammographic lesion and the clip.  The background signal in the breast was zero.  The wound was irrigated and closed with 3-0 vicryl in layers and 4-0 monocryl subcuticular suture.      Sterile dressings were applied. At the end of the operation, all sponge, instrument, and needle counts were  correct.  Findings: grossly clear surgical margins and no adenopathy  Estimated Blood Loss:  min         Specimens: right breast tissue with seed          Complications:  None; patient tolerated the procedure well.         Disposition: PACU - hemodynamically stable.         Condition: stable

## 2018-07-09 NOTE — H&P (Signed)
Bridget Cortez Location: [facility] Patient #: [id number] DOB: 1945/05/20 Married / Language: English / Race: Black or African American Female   History of Present Illness  The patient is a 73 year old female who presents for a follow-up for Nipple discharge. Pt is a 73 yo F referred by Earnstine Regal for right bloody nipple discharge. Patient started having staining on her bra around 1 to 1-1/2 weeks ago. She called her OB/GYN office and saw the nurse practitioner. Diagnostic studies were ordered. These did show a 5 mm lesion at 6:00 in the subareolar location. Core needle biopsy was performed. This showed dense fibrosis with ectatic ductal tissue. Patient is quite anxious because she has a history of contralateral breast cancer and had bloody nipple discharge at age 39 with a intraductal papilloma. She was not having pain prior to the biopsy, but now her breast is quite sore. She has had some bruising after the biopsy.  Of note, patient had left breast cancer in 1999. [place] was [place] with lumpectomy, radiation, and 10 years of antihormone tx with tamoxifen and then femara. She saw Eston Esters when she got here and he agreed that it was OK to stop. Her sister also developed breast cancer in 2001 and succombed to disease.   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    Pt had MRI which was negative for any other concerns. She comes back in to discuss surgery. She is adamant about not waiting due to her previous cancer experience.   MRI 04/09/2018 IMPRESSION: 1. Post biopsy changes within the RETROAREOLAR RIGHT breast. No findings to suggest a cause of patient's bloody RIGHT nipple discharge. Recommend surgical follow-up. 2. No mammographic evidence of breast malignancy.   dx mammogram right 03/14/2018 CLINICAL DATA: 73 year old female with history of spontaneous bloody right nipple discharge for  the past 3 days. Prior history of right breast papilloma as well as prior history of left breast cancer 1999.  EXAM: DIGITAL DIAGNOSTIC UNILATERAL RIGHT MAMMOGRAM WITH CAD AND TOMO  RIGHT BREAST ULTRASOUND  COMPARISON: Previous exam(s).  ACR Breast Density Category b: There are scattered areas of fibroglandular density.  FINDINGS: No suspicious masses or calcifications are seen in either breast. An initially questioned asymmetry seen in the lower right breast resolves on the spot compression MLO tomograms.  Mammographic images were processed with CAD.  Physical examination reveals normal appearance of the right breast. No discharge elicited during compression of the ultrasound probe.  Targeted ultrasound of the right breast was performed. There is a focally dilated duct in the right breast with an intraductal mass at 6 o'clock subareolar, with the mass measuring 0.5 x 0.3 x 0.3 cm. No lymphadenopathy seen in the right axilla.  IMPRESSION: Intraductal mass in the right breast at 6 o'clock subareolar in patient with history of spontaneous bloody nipple discharge.  RECOMMENDATION: Ultrasound-guided biopsy of the intraductal mass in the right breast is recommended. This is scheduled for 03/19/2018 at 7:30 a.m.  I have discussed the findings and recommendations with the patient. Results were also provided in writing at the conclusion of the visit. If applicable, a reminder letter will be sent to the patient regarding the next appointment.  BI-RADS CATEGORY 4: Suspicious.    pathology right breast core needle biopsy 03/19/2018 Diagnosis Breast, right, needle core biopsy, 6 o'clock retroareolar - MILDLY ECTATIC DUCTS. DENSE STROMAL FIBROSIS.   Allergies  Excedrin *ANALGESICS - NonNarcotic*  Cortisone *CORTICOSTEROIDS*  Allergies Reconciled   Medication History  amLODIPine Besylate (10MG  Tablet, Oral) Active. Aspirin (81MG  Tablet DR, Oral) Active. Calcium  (Oral) Specific strength unknown - Active. CeleBREX (200MG  Capsule, Oral) Active. Esomeprazole Magnesium (40MG  Capsule DR, Oral) Active. Polyethylene Active. Rosuvastatin Calcium (5MG  Tablet, Oral) Active. Simethicone (125MG  Tablet Chewable, Oral) Active. Vitamin C (500MG  Tablet Chewable, Oral) Active. ZyrTEC Allergy (10MG  Tablet, Oral) Active. Fluticasone Propionate (50MCG/ACT Suspension, Nasal) Active. Multi Vitamin (Oral) Active. Nystatin (100000 UNIT/ML Suspension, Mouth/Throat) Active. Azithromycin (250MG  Tablet, Oral) Active. Erythromycin (5MG /GM Ointment, Ophthalmic) Active. Potassium Chloride Crys ER (20MEQ Tablet ER, Oral) Active. Fluticasone-Salmeterol (250-50MCG/DOSE Aero Pow Br Act, Inhalation) Active. Albuterol Sulfate ((2.5 MG/3ML)0.083% Nebulized Soln, Inhalation) Active. Bystolic (5MG  Tablet, Oral) Active. Losartan Potassium (100MG  Tablet, Oral) Active. Atorvastatin Calcium (20MG  Tablet, Oral) Active. Albuterol Sulfate HFA (108 (90 Base)MCG/ACT Aerosol Soln, Inhalation) Active. Medications Reconciled    Review of Systems  All other systems negative  Vitals  Weight: 252.13 lb Height: 68in Body Surface Area: 2.26 m Body Mass Index: 38.34 kg/m  Temp.: 98.45F (Temporal)  Pulse: 85 (Regular)  P.OX: 96% (Room air) BP: 148/88(Sitting, Left Arm, Standard)       Physical Exam  General Mental Status-Alert. General Appearance-Consistent with stated age. Hydration-Well hydrated. Voice-Normal.  Head and Neck Head-normocephalic, atraumatic with no lesions or palpable masses.  Eye Sclera/Conjunctiva - Bilateral-No scleral icterus.  Chest and Lung Exam Chest and lung exam reveals -quiet, even and easy respiratory effort with no use of accessory muscles. Inspection Chest Wall - Normal. Back - normal.  Breast Note: large keratoses and skin tags under bilateral breasts. breasts relatively symmetric bilaterally. no  palpable lesion. no nipple discharge expressible.   Cardiovascular Cardiovascular examination reveals -normal pedal pulses bilaterally. Note: regular rate and rhythm  Abdomen Inspection-Inspection Normal. Palpation/Percussion Palpation and Percussion of the abdomen reveal - Soft, Non Tender, No Rebound tenderness, No Rigidity (guarding) and No hepatosplenomegaly.  Peripheral Vascular Upper Extremity Inspection - Bilateral - Normal - No Clubbing, No Cyanosis, No Edema, Pulses Intact. Lower Extremity Palpation - Edema - Bilateral - No edema.  Neurologic Neurologic evaluation reveals -alert and oriented x 3 with no impairment of recent or remote memory. Mental Status-Normal.  Musculoskeletal Global Assessment -Note: no gross deformities.  Normal Exam - Left-Upper Extremity Strength Normal and Lower Extremity Strength Normal. Normal Exam - Right-Upper Extremity Strength Normal and Lower Extremity Strength Normal.  Lymphatic Head & Neck  General Head & Neck Lymphatics: Bilateral - Description - Normal. Axillary  General Axillary Region: Bilateral - Description - Normal. Tenderness - Non Tender.    Assessment & Plan  BLOODY DISCHARGE FROM RIGHT NIPPLE [id number]) Impression: Seed localized excisional breast biopsy planned for next week.  30 min spent in counseling in addition to exam. Discussed asthma, covid situation, time of anesthesia, other medications, and more.  Current Plans Instructed to keep follow-up appointment as scheduled

## 2018-07-10 ENCOUNTER — Encounter (HOSPITAL_COMMUNITY): Payer: Self-pay | Admitting: General Surgery

## 2018-07-10 NOTE — Progress Notes (Signed)
Please let patient know there is no cancer!

## 2018-07-14 NOTE — Telephone Encounter (Signed)
L  Please tell her we are thrilled taht they found no Cancer  thx  sk

## 2018-07-24 DIAGNOSIS — M1711 Unilateral primary osteoarthritis, right knee: Secondary | ICD-10-CM | POA: Diagnosis not present

## 2018-07-24 DIAGNOSIS — M1712 Unilateral primary osteoarthritis, left knee: Secondary | ICD-10-CM | POA: Diagnosis not present

## 2018-08-01 DIAGNOSIS — M25661 Stiffness of right knee, not elsewhere classified: Secondary | ICD-10-CM | POA: Diagnosis not present

## 2018-08-01 DIAGNOSIS — M170 Bilateral primary osteoarthritis of knee: Secondary | ICD-10-CM | POA: Diagnosis not present

## 2018-08-01 DIAGNOSIS — M25662 Stiffness of left knee, not elsewhere classified: Secondary | ICD-10-CM | POA: Diagnosis not present

## 2018-08-01 DIAGNOSIS — M6281 Muscle weakness (generalized): Secondary | ICD-10-CM | POA: Diagnosis not present

## 2018-08-06 DIAGNOSIS — M170 Bilateral primary osteoarthritis of knee: Secondary | ICD-10-CM | POA: Diagnosis not present

## 2018-08-06 DIAGNOSIS — M6281 Muscle weakness (generalized): Secondary | ICD-10-CM | POA: Diagnosis not present

## 2018-08-06 DIAGNOSIS — M25662 Stiffness of left knee, not elsewhere classified: Secondary | ICD-10-CM | POA: Diagnosis not present

## 2018-08-06 DIAGNOSIS — M25661 Stiffness of right knee, not elsewhere classified: Secondary | ICD-10-CM | POA: Diagnosis not present

## 2018-08-09 DIAGNOSIS — M170 Bilateral primary osteoarthritis of knee: Secondary | ICD-10-CM | POA: Diagnosis not present

## 2018-08-09 DIAGNOSIS — M6281 Muscle weakness (generalized): Secondary | ICD-10-CM | POA: Diagnosis not present

## 2018-08-09 DIAGNOSIS — M25662 Stiffness of left knee, not elsewhere classified: Secondary | ICD-10-CM | POA: Diagnosis not present

## 2018-08-09 DIAGNOSIS — M25661 Stiffness of right knee, not elsewhere classified: Secondary | ICD-10-CM | POA: Diagnosis not present

## 2018-08-12 DIAGNOSIS — M25661 Stiffness of right knee, not elsewhere classified: Secondary | ICD-10-CM | POA: Diagnosis not present

## 2018-08-12 DIAGNOSIS — M6281 Muscle weakness (generalized): Secondary | ICD-10-CM | POA: Diagnosis not present

## 2018-08-12 DIAGNOSIS — M170 Bilateral primary osteoarthritis of knee: Secondary | ICD-10-CM | POA: Diagnosis not present

## 2018-08-12 DIAGNOSIS — M25662 Stiffness of left knee, not elsewhere classified: Secondary | ICD-10-CM | POA: Diagnosis not present

## 2018-08-14 DIAGNOSIS — M170 Bilateral primary osteoarthritis of knee: Secondary | ICD-10-CM | POA: Diagnosis not present

## 2018-08-14 DIAGNOSIS — M25662 Stiffness of left knee, not elsewhere classified: Secondary | ICD-10-CM | POA: Diagnosis not present

## 2018-08-14 DIAGNOSIS — M6281 Muscle weakness (generalized): Secondary | ICD-10-CM | POA: Diagnosis not present

## 2018-08-14 DIAGNOSIS — M25661 Stiffness of right knee, not elsewhere classified: Secondary | ICD-10-CM | POA: Diagnosis not present

## 2018-08-19 DIAGNOSIS — M6281 Muscle weakness (generalized): Secondary | ICD-10-CM | POA: Diagnosis not present

## 2018-08-19 DIAGNOSIS — M25661 Stiffness of right knee, not elsewhere classified: Secondary | ICD-10-CM | POA: Diagnosis not present

## 2018-08-19 DIAGNOSIS — M25662 Stiffness of left knee, not elsewhere classified: Secondary | ICD-10-CM | POA: Diagnosis not present

## 2018-08-19 DIAGNOSIS — M170 Bilateral primary osteoarthritis of knee: Secondary | ICD-10-CM | POA: Diagnosis not present

## 2018-08-22 DIAGNOSIS — M170 Bilateral primary osteoarthritis of knee: Secondary | ICD-10-CM | POA: Diagnosis not present

## 2018-08-22 DIAGNOSIS — M25661 Stiffness of right knee, not elsewhere classified: Secondary | ICD-10-CM | POA: Diagnosis not present

## 2018-08-22 DIAGNOSIS — M25662 Stiffness of left knee, not elsewhere classified: Secondary | ICD-10-CM | POA: Diagnosis not present

## 2018-08-22 DIAGNOSIS — M6281 Muscle weakness (generalized): Secondary | ICD-10-CM | POA: Diagnosis not present

## 2018-08-27 DIAGNOSIS — M25661 Stiffness of right knee, not elsewhere classified: Secondary | ICD-10-CM | POA: Diagnosis not present

## 2018-08-27 DIAGNOSIS — M170 Bilateral primary osteoarthritis of knee: Secondary | ICD-10-CM | POA: Diagnosis not present

## 2018-08-27 DIAGNOSIS — M6281 Muscle weakness (generalized): Secondary | ICD-10-CM | POA: Diagnosis not present

## 2018-08-27 DIAGNOSIS — M25662 Stiffness of left knee, not elsewhere classified: Secondary | ICD-10-CM | POA: Diagnosis not present

## 2018-08-30 DIAGNOSIS — H40023 Open angle with borderline findings, high risk, bilateral: Secondary | ICD-10-CM | POA: Diagnosis not present

## 2018-09-02 DIAGNOSIS — M25662 Stiffness of left knee, not elsewhere classified: Secondary | ICD-10-CM | POA: Diagnosis not present

## 2018-09-02 DIAGNOSIS — M25661 Stiffness of right knee, not elsewhere classified: Secondary | ICD-10-CM | POA: Diagnosis not present

## 2018-09-02 DIAGNOSIS — M6281 Muscle weakness (generalized): Secondary | ICD-10-CM | POA: Diagnosis not present

## 2018-09-02 DIAGNOSIS — M170 Bilateral primary osteoarthritis of knee: Secondary | ICD-10-CM | POA: Diagnosis not present

## 2018-09-04 DIAGNOSIS — R9431 Abnormal electrocardiogram [ECG] [EKG]: Secondary | ICD-10-CM | POA: Diagnosis not present

## 2018-09-04 DIAGNOSIS — M1990 Unspecified osteoarthritis, unspecified site: Secondary | ICD-10-CM | POA: Diagnosis not present

## 2018-09-04 DIAGNOSIS — I519 Heart disease, unspecified: Secondary | ICD-10-CM | POA: Diagnosis not present

## 2018-09-04 DIAGNOSIS — F419 Anxiety disorder, unspecified: Secondary | ICD-10-CM | POA: Diagnosis not present

## 2018-09-04 DIAGNOSIS — E782 Mixed hyperlipidemia: Secondary | ICD-10-CM | POA: Diagnosis not present

## 2018-09-04 DIAGNOSIS — R600 Localized edema: Secondary | ICD-10-CM | POA: Diagnosis not present

## 2018-09-04 DIAGNOSIS — I10 Essential (primary) hypertension: Secondary | ICD-10-CM | POA: Diagnosis not present

## 2018-09-05 DIAGNOSIS — M25662 Stiffness of left knee, not elsewhere classified: Secondary | ICD-10-CM | POA: Diagnosis not present

## 2018-09-05 DIAGNOSIS — M170 Bilateral primary osteoarthritis of knee: Secondary | ICD-10-CM | POA: Diagnosis not present

## 2018-09-05 DIAGNOSIS — M25661 Stiffness of right knee, not elsewhere classified: Secondary | ICD-10-CM | POA: Diagnosis not present

## 2018-09-05 DIAGNOSIS — M6281 Muscle weakness (generalized): Secondary | ICD-10-CM | POA: Diagnosis not present

## 2018-09-09 DIAGNOSIS — M1711 Unilateral primary osteoarthritis, right knee: Secondary | ICD-10-CM | POA: Diagnosis not present

## 2018-09-09 DIAGNOSIS — M1712 Unilateral primary osteoarthritis, left knee: Secondary | ICD-10-CM | POA: Diagnosis not present

## 2018-09-11 DIAGNOSIS — M6281 Muscle weakness (generalized): Secondary | ICD-10-CM | POA: Diagnosis not present

## 2018-09-11 DIAGNOSIS — M25662 Stiffness of left knee, not elsewhere classified: Secondary | ICD-10-CM | POA: Diagnosis not present

## 2018-09-11 DIAGNOSIS — M170 Bilateral primary osteoarthritis of knee: Secondary | ICD-10-CM | POA: Diagnosis not present

## 2018-09-11 DIAGNOSIS — M25661 Stiffness of right knee, not elsewhere classified: Secondary | ICD-10-CM | POA: Diagnosis not present

## 2018-09-13 DIAGNOSIS — M170 Bilateral primary osteoarthritis of knee: Secondary | ICD-10-CM | POA: Diagnosis not present

## 2018-09-13 DIAGNOSIS — M25661 Stiffness of right knee, not elsewhere classified: Secondary | ICD-10-CM | POA: Diagnosis not present

## 2018-09-13 DIAGNOSIS — M25662 Stiffness of left knee, not elsewhere classified: Secondary | ICD-10-CM | POA: Diagnosis not present

## 2018-09-13 DIAGNOSIS — M6281 Muscle weakness (generalized): Secondary | ICD-10-CM | POA: Diagnosis not present

## 2018-09-16 DIAGNOSIS — M170 Bilateral primary osteoarthritis of knee: Secondary | ICD-10-CM | POA: Diagnosis not present

## 2018-09-16 DIAGNOSIS — M25661 Stiffness of right knee, not elsewhere classified: Secondary | ICD-10-CM | POA: Diagnosis not present

## 2018-09-16 DIAGNOSIS — M25662 Stiffness of left knee, not elsewhere classified: Secondary | ICD-10-CM | POA: Diagnosis not present

## 2018-09-16 DIAGNOSIS — M6281 Muscle weakness (generalized): Secondary | ICD-10-CM | POA: Diagnosis not present

## 2018-09-18 DIAGNOSIS — M170 Bilateral primary osteoarthritis of knee: Secondary | ICD-10-CM | POA: Diagnosis not present

## 2018-09-18 DIAGNOSIS — M25661 Stiffness of right knee, not elsewhere classified: Secondary | ICD-10-CM | POA: Diagnosis not present

## 2018-09-18 DIAGNOSIS — M25662 Stiffness of left knee, not elsewhere classified: Secondary | ICD-10-CM | POA: Diagnosis not present

## 2018-09-18 DIAGNOSIS — M6281 Muscle weakness (generalized): Secondary | ICD-10-CM | POA: Diagnosis not present

## 2018-09-23 DIAGNOSIS — M25662 Stiffness of left knee, not elsewhere classified: Secondary | ICD-10-CM | POA: Diagnosis not present

## 2018-09-23 DIAGNOSIS — M6281 Muscle weakness (generalized): Secondary | ICD-10-CM | POA: Diagnosis not present

## 2018-09-23 DIAGNOSIS — M25661 Stiffness of right knee, not elsewhere classified: Secondary | ICD-10-CM | POA: Diagnosis not present

## 2018-09-23 DIAGNOSIS — M170 Bilateral primary osteoarthritis of knee: Secondary | ICD-10-CM | POA: Diagnosis not present

## 2018-09-26 DIAGNOSIS — M25662 Stiffness of left knee, not elsewhere classified: Secondary | ICD-10-CM | POA: Diagnosis not present

## 2018-09-26 DIAGNOSIS — M6281 Muscle weakness (generalized): Secondary | ICD-10-CM | POA: Diagnosis not present

## 2018-09-26 DIAGNOSIS — M25661 Stiffness of right knee, not elsewhere classified: Secondary | ICD-10-CM | POA: Diagnosis not present

## 2018-09-26 DIAGNOSIS — M170 Bilateral primary osteoarthritis of knee: Secondary | ICD-10-CM | POA: Diagnosis not present

## 2018-10-03 ENCOUNTER — Other Ambulatory Visit: Payer: Self-pay | Admitting: Obstetrics and Gynecology

## 2018-10-03 ENCOUNTER — Other Ambulatory Visit: Payer: Self-pay | Admitting: Internal Medicine

## 2018-10-03 DIAGNOSIS — Z1231 Encounter for screening mammogram for malignant neoplasm of breast: Secondary | ICD-10-CM

## 2018-10-13 ENCOUNTER — Other Ambulatory Visit: Payer: Self-pay | Admitting: Internal Medicine

## 2018-10-18 ENCOUNTER — Telehealth: Payer: Self-pay | Admitting: Internal Medicine

## 2018-10-18 DIAGNOSIS — Z23 Encounter for immunization: Secondary | ICD-10-CM | POA: Diagnosis not present

## 2018-10-18 NOTE — Telephone Encounter (Signed)
New Message    Patient would like her husband to come to appointment with her.  Please call to discuss.

## 2018-10-22 NOTE — Telephone Encounter (Signed)
Pt states she has trouble ambulating by herself. He husband assists her throughout the day. She needs his assistance to move about the office. Pt's husband is okay to accompany her tomorrow to her appt with Dr. Caryl Comes.

## 2018-10-23 ENCOUNTER — Ambulatory Visit (INDEPENDENT_AMBULATORY_CARE_PROVIDER_SITE_OTHER): Payer: Medicare Other | Admitting: Internal Medicine

## 2018-10-23 ENCOUNTER — Encounter: Payer: Self-pay | Admitting: Internal Medicine

## 2018-10-23 ENCOUNTER — Other Ambulatory Visit: Payer: Self-pay

## 2018-10-23 VITALS — BP 130/82 | HR 78 | Ht 67.0 in | Wt 250.4 lb

## 2018-10-23 DIAGNOSIS — I472 Ventricular tachycardia: Secondary | ICD-10-CM | POA: Diagnosis not present

## 2018-10-23 DIAGNOSIS — I471 Supraventricular tachycardia: Secondary | ICD-10-CM

## 2018-10-23 DIAGNOSIS — I10 Essential (primary) hypertension: Secondary | ICD-10-CM | POA: Diagnosis not present

## 2018-10-23 DIAGNOSIS — R000 Tachycardia, unspecified: Secondary | ICD-10-CM

## 2018-10-23 NOTE — Patient Instructions (Signed)
Medication Instructions:  Your physician recommends that you continue on your current medications as directed. Please refer to the Current Medication list given to you today.  * If you need a refill on your cardiac medications before your next appointment, please call your pharmacy.   Labwork: None ordered  Testing/Procedures: None ordered  Follow-Up: Your physician wants you to follow-up in: 6 months with Dr. Caryl Comes.  You will receive a reminder letter in the mail two months in advance. If you don't receive a letter, please call our office to schedule the follow-up appointment.  Thank you for choosing [facility]!!

## 2018-10-23 NOTE — Progress Notes (Signed)
Patient Care Team: [name], MD as PCP - General (Internal Medicine) Burnell Blanks, MD as PCP - Cardiology (Cardiology)   HPI  Bridget Cortez is a 73 y.o. female Seen in followup for [name] which had ECG features of VT (concordance//aVR) but initiation and termination sequences suggestive of SVT--GT agreed more likely SVT  Rx with amlodipine>> metoprolol  Continues with occasional palpitations typically short duration 2-3 minutes.  Relieved with sitting down.  She is interested in knee surgery but is wanting to postpone until after the COVID pandemic abates.  Shortness of breath is stable.  Edema stable.  No chest pain.  DATE TEST EF   8/16 Echo   55-65 %   7/19 Echo   55-60 %         Date Cr K Hgb  2/20 0.86 4.1 12.3  6/20  0.62 3.8       Past Medical History:  Diagnosis Date  . Abnormal Pap smear 2006   vaginal medicine according to patient  . Allergy   . Arthritis   . Asthma   . 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 Breast cancer ([place]) 1999   Left  . CAP (community acquired pneumonia) 08/06/2014  . Cataract    cataracts lt. eye    cataract removed.  . Chiari malformation   . Diverticulosis   . GERD (gastroesophageal reflux disease)   . H/O osteopenia    08/2006  . H/O varicella   . Heart murmur   . Hypertension   . Monilial vaginitis 07/2007  . Multiple allergies   . Ovarian cyst   . Personal history of colonic adenomas 03/13/2012  . Personal history of radiation therapy 1990  . Pneumonia 07/2014  . 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 Yeast infection     Past Surgical History:  Procedure Laterality Date  . ABDOMINAL HYSTERECTOMY    . ABDOMINAL SURGERY    . BREAST LUMPECTOMY Left 1990  . BREAST SURGERY     Lumpectomy, left breast  . CATARACT EXTRACTION Left 2018  . COLONOSCOPY    . COLONOSCOPY W/ BIOPSIES    . EYE SURGERY    . HERNIA REPAIR    . KNEE SURGERY     right  . lt. eye aneurysm surgery Left 2017   . MOUTH SURGERY  2019   rt. side gum surgery  Lt. done 3 years ago  . RADIOACTIVE SEED GUIDED EXCISIONAL BREAST BIOPSY Right 07/09/2018   Procedure: RADIOACTIVE SEED GUIDED EXCISIONAL RIGHT  BREAST BIOPSY;  Surgeon: Stark Zimal Weisensel, MD;  Location: [facility];  Service: General;  Laterality: Right;    Current Meds  Medication Sig  . acetaminophen (TYLENOL) 325 MG tablet Take 2 tablets (650 mg total) by mouth every 6 (six) hours as needed for mild pain, fever or headache (or Fever >/= 101).  Marland Kitchen albuterol (PROVENTIL) (5 MG/ML) 0.5% nebulizer solution Take 0.5 mLs (2.5 mg total) by nebulization every 6 (six) hours as needed for wheezing or shortness of breath.  Marland Kitchen amLODipine (NORVASC) 10 MG tablet Take 5 mg by mouth at bedtime.   Marland Kitchen aspirin EC 81 MG tablet Take 81 mg by mouth daily.  Marland Kitchen BYSTOLIC 5 MG tablet Take 1 tablet by mouth once daily  . Calcium Carbonate-Vitamin D (CALTRATE 600+D PO) Take 1 tablet by mouth daily with lunch.   . cetirizine (ZYRTEC) 10 MG tablet Take 10 mg by mouth daily.  Marland Kitchen esomeprazole (NEXIUM) 40 MG  capsule Take 40 mg by mouth 2 (two) times daily before a meal.   . fluticasone (FLONASE) 50 MCG/ACT nasal spray Place 1 spray into both nostrils at bedtime.   . Fluticasone-Salmeterol (ADVAIR) 250-50 MCG/DOSE AEPB Inhale 1 puff into the lungs every 12 (twelve) hours.  Marland Kitchen losartan (COZAAR) 100 MG tablet Take 100 mg by mouth daily.  . Multiple Vitamin (MULTIVITAMIN) tablet Take 1 tablet by mouth daily with lunch.   . oxyCODONE (OXY IR/ROXICODONE) 5 MG immediate release tablet Take 1 tablet (5 mg total) by mouth every 6 (six) hours as needed for severe pain.  . potassium chloride SA (KLOR-CON) 20 MEQ tablet Take 60 mEq by mouth daily.  . rosuvastatin (CRESTOR) 5 MG tablet Take 5 mg by mouth daily.  . vitamin C (ASCORBIC ACID) 500 MG tablet Take 500 mg by mouth daily.    Allergies  Allergen Reactions  . Ephedrine Shortness Of Breath  . Cortisone Other (See Comments)    Severe muscle  spasms      Review of Systems negative except from HPI and PMH  Physical Exam BP 130/82   Pulse 78   Ht 5\' 7"  (1.702 m)   Wt 250 lb 6.4 oz (113.6 kg)   SpO2 99%   BMI 39.22 kg/m  Well developed and nourished in no acute distress HENT normal Neck supple with JVP-   Clear Regular rate and rhythm, no murmurs or gallops Abd-soft  No Clubbing cyanosi tr edema Skin-warm and dry A & Oriented  Grossly normal sensory and motor function  ECG sinus @ 78 18/10/38     Assessment and  Plan  WCT probably SVT  HTN  Bloody discharge from nipple  HFpEF  BP reasonably controlled home  Mild volume overload  Reviewed preoperative status, encouraged her to proceed   Current medicines are reviewed at length with the patient today .  The patient does not  have concerns regarding medicines.

## 2018-11-05 DIAGNOSIS — G935 Compression of brain: Secondary | ICD-10-CM | POA: Diagnosis not present

## 2018-11-19 ENCOUNTER — Other Ambulatory Visit: Payer: Self-pay

## 2018-11-19 ENCOUNTER — Other Ambulatory Visit: Payer: Self-pay | Admitting: Obstetrics and Gynecology

## 2018-11-19 ENCOUNTER — Ambulatory Visit
Admission: RE | Admit: 2018-11-19 | Discharge: 2018-11-19 | Disposition: A | Discharge status: Home / Self Care | Payer: Medicare Other | Source: Ambulatory Visit | Attending: Obstetrics and Gynecology | Admitting: Obstetrics and Gynecology

## 2018-11-19 DIAGNOSIS — Z1231 Encounter for screening mammogram for malignant neoplasm of breast: Secondary | ICD-10-CM | POA: Diagnosis not present

## 2019-01-27 ENCOUNTER — Other Ambulatory Visit (HOSPITAL_COMMUNITY): Payer: Self-pay | Admitting: Critical Care Medicine

## 2019-01-27 ENCOUNTER — Telehealth (HOSPITAL_COMMUNITY): Payer: Self-pay | Admitting: Critical Care Medicine

## 2019-01-27 DIAGNOSIS — U071 COVID-19: Secondary | ICD-10-CM

## 2019-01-27 DIAGNOSIS — I472 Ventricular tachycardia: Secondary | ICD-10-CM

## 2019-01-27 DIAGNOSIS — Z20818 Contact with and (suspected) exposure to other bacterial communicable diseases: Secondary | ICD-10-CM | POA: Diagnosis not present

## 2019-01-27 DIAGNOSIS — J45909 Unspecified asthma, uncomplicated: Secondary | ICD-10-CM | POA: Diagnosis not present

## 2019-01-27 DIAGNOSIS — R000 Tachycardia, unspecified: Secondary | ICD-10-CM

## 2019-01-27 DIAGNOSIS — I10 Essential (primary) hypertension: Secondary | ICD-10-CM

## 2019-01-27 DIAGNOSIS — R05 Cough: Secondary | ICD-10-CM | POA: Diagnosis not present

## 2019-01-27 DIAGNOSIS — I519 Heart disease, unspecified: Secondary | ICD-10-CM | POA: Diagnosis not present

## 2019-01-27 NOTE — Progress Notes (Signed)
  I connected by phone with Bridget Cortez on 01/27/2019 at 4:51 PM to discuss the potential use of an new treatment for mild to moderate COVID-19 viral infection in non-hospitalized patients.  This patient is a 74 y.o. female that meets the FDA criteria for Emergency Use Authorization of bamlanivimab or casirivimab\imdevimab.  Has a (+) direct SARS-CoV-2 viral test result  Has mild or moderate COVID-19   Is ? 74 years of age and weighs ? 40 kg  Is NOT hospitalized due to COVID-19  Is NOT requiring oxygen therapy or requiring an increase in baseline oxygen flow rate due to COVID-19  Is within 10 days of symptom onset  Has at least one of the high risk factor(s) for progression to severe COVID-19 and/or hospitalization as defined in EUA. Specific high risk criteria : >/= 74 yo , age> 55 CV disease, HTN  I have spoken and communicated the following to the patient or parent/caregiver:  1. FDA has authorized the emergency use of bamlanivimab and casirivimab\imdevimab for the treatment of mild to moderate COVID-19 in adults and pediatric patients with positive results of direct SARS-CoV-2 viral testing who are 64 years of age and older weighing at least 40 kg, and who are at high risk for progressing to severe COVID-19 and/or hospitalization.  2. The significant known and potential risks and benefits of bamlanivimab and casirivimab\imdevimab, and the extent to which such potential risks and benefits are unknown.  3. Information on available alternative treatments and the risks and benefits of those alternatives, including clinical trials.  4. Patients treated with bamlanivimab and casirivimab\imdevimab should continue to self-isolate and use infection control measures (e.g., wear mask, isolate, social distance, avoid sharing personal items, clean and disinfect "high touch" surfaces, and frequent handwashing) according to CDC guidelines.   5. The patient or parent/caregiver has the  option to accept or refuse bamlanivimab or casirivimab\imdevimab .  After reviewing this information with the patient, The patient agreed to proceed with receiving the bamlanimivab infusion and will be provided a copy of the Fact sheet prior to receiving the infusion.[name] 01/27/2019 4:51 PM

## 2019-01-27 NOTE — Telephone Encounter (Signed)
I connected with this patient who is Covid positive from January 4 testing and symptoms began on December 31.  The patient has nasal congestion sneezing cough low-grade fever and headaches  She is at risk due to her age and other comorbid conditions and agrees to the antibody infusion  Called to discuss with patient about Covid symptoms and the use of bamlanivimab, a monoclonal antibody infusion for those with mild to moderate Covid symptoms and at a high risk of hospitalization.  Pt is qualified for this infusion at the [facility] infusion center due to Age > 59 HTN CV disease  The infusion is scheduled for January 7

## 2019-01-30 ENCOUNTER — Ambulatory Visit (HOSPITAL_COMMUNITY)
Admission: RE | Admit: 2019-01-30 | Discharge: 2019-01-30 | Disposition: A | Discharge status: Home / Self Care | Payer: Medicare Other | Source: Ambulatory Visit | Attending: Pulmonary Disease | Admitting: Pulmonary Disease

## 2019-01-30 DIAGNOSIS — Z23 Encounter for immunization: Secondary | ICD-10-CM | POA: Insufficient documentation

## 2019-01-30 DIAGNOSIS — I10 Essential (primary) hypertension: Secondary | ICD-10-CM | POA: Diagnosis not present

## 2019-01-30 DIAGNOSIS — I472 Ventricular tachycardia: Secondary | ICD-10-CM | POA: Insufficient documentation

## 2019-01-30 DIAGNOSIS — R000 Tachycardia, unspecified: Secondary | ICD-10-CM

## 2019-01-30 DIAGNOSIS — U071 COVID-19: Secondary | ICD-10-CM | POA: Diagnosis not present

## 2019-01-30 MED ORDER — EPINEPHRINE 0.3 MG/0.3ML IJ SOAJ: 0.3000 mg | Freq: Once | INTRAMUSCULAR | Status: DC | PRN

## 2019-01-30 MED ORDER — METHYLPREDNISOLONE SODIUM SUCC 125 MG IJ SOLR: 125.0000 mg | Freq: Once | INTRAMUSCULAR | Status: DC | PRN

## 2019-01-30 MED ORDER — ALBUTEROL SULFATE HFA 108 (90 BASE) MCG/ACT IN AERS: 2.0000 | INHALATION_SPRAY | Freq: Once | RESPIRATORY_TRACT | Status: DC | PRN

## 2019-01-30 MED ORDER — SODIUM CHLORIDE 0.9 % IV SOLN
INTRAVENOUS | Status: DC | PRN
  Administered 2019-01-30: 14:00:00 250 mL via INTRAVENOUS

## 2019-01-30 MED ORDER — DIPHENHYDRAMINE HCL 50 MG/ML IJ SOLN: 50.0000 mg | Freq: Once | INTRAMUSCULAR | Status: DC | PRN

## 2019-01-30 MED ORDER — FAMOTIDINE IN NACL 20-0.9 MG/50ML-% IV SOLN: 20.0000 mg | Freq: Once | INTRAVENOUS | Status: DC | PRN

## 2019-01-30 MED ORDER — SODIUM CHLORIDE 0.9 % IV SOLN
700.0000 mg | Freq: Once | INTRAVENOUS | Status: AC
  Administered 2019-01-30: 700 mg via INTRAVENOUS
  Filled 2019-01-30: qty 20

## 2019-01-30 NOTE — Progress Notes (Signed)
  Diagnosis: COVID-19  Physician: Dr. Dagmar Hait  Procedure: Covid Infusion Clinic Med: bamlanivimab infusion - Provided patient with bamlanimivab fact sheet for patients, parents and caregivers prior to infusion.  Complications: No immediate complications noted.  Discharge: Discharged home   Tanesha Arambula N Assia Meanor 01/30/2019

## 2019-01-30 NOTE — Discharge Instructions (Signed)

## 2019-02-12 ENCOUNTER — Ambulatory Visit
Admission: EM | Admit: 2019-02-12 | Discharge: 2019-02-12 | Disposition: A | Discharge status: Home / Self Care | Payer: Medicare Other

## 2019-02-17 DIAGNOSIS — H00025 Hordeolum internum left lower eyelid: Secondary | ICD-10-CM | POA: Diagnosis not present

## 2019-02-28 DIAGNOSIS — Z20822 Contact with and (suspected) exposure to covid-19: Secondary | ICD-10-CM | POA: Diagnosis not present

## 2019-02-28 DIAGNOSIS — R519 Headache, unspecified: Secondary | ICD-10-CM | POA: Diagnosis not present

## 2019-03-06 DIAGNOSIS — H40023 Open angle with borderline findings, high risk, bilateral: Secondary | ICD-10-CM | POA: Diagnosis not present

## 2019-03-06 DIAGNOSIS — H26492 Other secondary cataract, left eye: Secondary | ICD-10-CM | POA: Diagnosis not present

## 2019-03-06 DIAGNOSIS — H25011 Cortical age-related cataract, right eye: Secondary | ICD-10-CM | POA: Diagnosis not present

## 2019-03-06 DIAGNOSIS — Z961 Presence of intraocular lens: Secondary | ICD-10-CM | POA: Diagnosis not present

## 2019-03-12 DIAGNOSIS — Z6837 Body mass index (BMI) 37.0-37.9, adult: Secondary | ICD-10-CM | POA: Diagnosis not present

## 2019-03-12 DIAGNOSIS — M8580 Other specified disorders of bone density and structure, unspecified site: Secondary | ICD-10-CM | POA: Diagnosis not present

## 2019-03-12 DIAGNOSIS — Z01419 Encounter for gynecological examination (general) (routine) without abnormal findings: Secondary | ICD-10-CM | POA: Diagnosis not present

## 2019-03-25 DIAGNOSIS — L304 Erythema intertrigo: Secondary | ICD-10-CM | POA: Diagnosis not present

## 2019-03-25 DIAGNOSIS — D2371 Other benign neoplasm of skin of right lower limb, including hip: Secondary | ICD-10-CM | POA: Diagnosis not present

## 2019-03-25 DIAGNOSIS — L245 Irritant contact dermatitis due to other chemical products: Secondary | ICD-10-CM | POA: Diagnosis not present

## 2019-03-25 DIAGNOSIS — L821 Other seborrheic keratosis: Secondary | ICD-10-CM | POA: Diagnosis not present

## 2019-04-03 DIAGNOSIS — Z13820 Encounter for screening for osteoporosis: Secondary | ICD-10-CM | POA: Diagnosis not present

## 2019-04-10 ENCOUNTER — Telehealth: Payer: Self-pay | Admitting: Internal Medicine

## 2019-04-10 NOTE — Telephone Encounter (Signed)
Spoke with pt and advised d/t her mobility issues ok for husband to attend appointment with her.  This is noted in appointment notes as well.

## 2019-04-10 NOTE — Telephone Encounter (Signed)
New Message:      Pt says she have an appt on 04-15-19 with Dr Caryl Comes. She says her husband will need to come in with her, her knees are extremely bad, can hardly walk. She says her husband always come in with her.

## 2019-04-11 ENCOUNTER — Other Ambulatory Visit: Payer: Self-pay | Admitting: Internal Medicine

## 2019-04-11 DIAGNOSIS — E782 Mixed hyperlipidemia: Secondary | ICD-10-CM | POA: Diagnosis not present

## 2019-04-13 DIAGNOSIS — I5030 Unspecified diastolic (congestive) heart failure: Secondary | ICD-10-CM | POA: Insufficient documentation

## 2019-04-15 ENCOUNTER — Ambulatory Visit (INDEPENDENT_AMBULATORY_CARE_PROVIDER_SITE_OTHER): Payer: Medicare Other | Admitting: Internal Medicine

## 2019-04-15 ENCOUNTER — Other Ambulatory Visit: Payer: Self-pay

## 2019-04-15 ENCOUNTER — Encounter: Payer: Self-pay | Admitting: Internal Medicine

## 2019-04-15 ENCOUNTER — Encounter (INDEPENDENT_AMBULATORY_CARE_PROVIDER_SITE_OTHER): Payer: Self-pay

## 2019-04-15 VITALS — BP 130/70 | HR 70 | Resp 15 | Ht 68.0 in | Wt 239.4 lb

## 2019-04-15 DIAGNOSIS — I472 Ventricular tachycardia: Secondary | ICD-10-CM

## 2019-04-15 DIAGNOSIS — I5030 Unspecified diastolic (congestive) heart failure: Secondary | ICD-10-CM

## 2019-04-15 DIAGNOSIS — R000 Tachycardia, unspecified: Secondary | ICD-10-CM

## 2019-04-15 DIAGNOSIS — I471 Supraventricular tachycardia: Secondary | ICD-10-CM | POA: Diagnosis not present

## 2019-04-15 NOTE — Patient Instructions (Signed)
Medication Instructions:  Your physician recommends that you continue on your current medications as directed. Please refer to the Current Medication list given to you today.  *If you need a refill on your cardiac medications before your next appointment, please call your pharmacy*   Lab Work: None ordered.  If you have labs (blood work) drawn today and your tests are completely normal, you will receive your results only by: . MyChart Message (if you have MyChart) OR . A paper copy in the mail If you have any lab test that is abnormal or we need to change your treatment, we will call you to review the results.   Testing/Procedures: None ordered.   Follow-Up: At [facility], you and your health needs are our priority.  As part of our continuing mission to provide you with exceptional heart care, we have created designated Provider Care Teams.  These Care Teams include your primary Cardiologist (physician) and Advanced Practice Providers (APPs -  Physician Assistants and Nurse Practitioners) who all work together to provide you with the care you need, when you need it.  We recommend signing up for the patient portal called "MyChart".  Sign up information is provided on this After Visit Summary.  MyChart is used to connect with patients for Virtual Visits (Telemedicine).  Patients are able to view lab/test results, encounter notes, upcoming appointments, etc.  Non-urgent messages can be sent to your provider as well.   To learn more about what you can do with MyChart, go to [email or web address].    Your next appointment:   6 months  The format for your next appointment: In person  Provider:   Dr Klein     

## 2019-04-15 NOTE — Progress Notes (Signed)
Patient Care Team: [name], MD as PCP - General (Internal Medicine) Burnell Blanks, MD as PCP - Cardiology (Cardiology)   HPI  Bridget Cortez is a 74 y.o. female Seen in followup for [facility] which had ECG features of VT (concordance//aVR) but initiation and termination sequences suggestive of SVT--GT agreed more likely SVT  Rx with amlodipine>> metoprolol  Continues with occasional palpitations typically short duration 2-3 minutes.  Relieved with sitting down.  She is interested in knee surgery she plans to do that this summer.  She got Covid in January.  Was treated with antibiotics.  Anticipates vaccine in about 3 more weeks.  She has lost 10 pounds.  Breathing is somewhat better.  Edema is a better.  Some palpitations but nonsustained.  DATE TEST EF   8/16 Echo   55-65 %   7/19 Echo   55-60 %         Date Cr K Hgb  2/20 0.86 4.1 12.3  6/20  0.62 3.8       Past Medical History:  Diagnosis Date  . Abnormal Pap smear 2006   vaginal medicine according to patient  . Allergy   . Arthritis   . Asthma   . 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 Breast cancer ([place]) 1999   Left  . CAP (community acquired pneumonia) 08/06/2014  . Cataract    cataracts lt. eye    cataract removed.  . Chiari malformation   . Diverticulosis   . GERD (gastroesophageal reflux disease)   . H/O osteopenia    08/2006  . H/O varicella   . Heart murmur   . Hypertension   . Monilial vaginitis 07/2007  . Multiple allergies   . Ovarian cyst   . Personal history of colonic adenomas 03/13/2012  . Personal history of radiation therapy 1990  . Pneumonia 07/2014  . 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 Yeast infection     Past Surgical History:  Procedure Laterality Date  . ABDOMINAL HYSTERECTOMY    . ABDOMINAL SURGERY    . BREAST EXCISIONAL BIOPSY Right    benign  . BREAST LUMPECTOMY Left 1990  . BREAST SURGERY     Lumpectomy, left breast  . CATARACT  EXTRACTION Left 2018  . COLONOSCOPY    . COLONOSCOPY W/ BIOPSIES    . EYE SURGERY    . HERNIA REPAIR    . KNEE SURGERY     right  . lt. eye aneurysm surgery Left 2017  . MOUTH SURGERY  2019   rt. side gum surgery  Lt. done 3 years ago  . RADIOACTIVE SEED GUIDED EXCISIONAL BREAST BIOPSY Right 07/09/2018   Procedure: RADIOACTIVE SEED GUIDED EXCISIONAL RIGHT  BREAST BIOPSY;  Surgeon: Stark Birney Belshe, MD;  Location: [facility];  Service: General;  Laterality: Right;    Current Meds  Medication Sig  . acetaminophen (TYLENOL) 325 MG tablet Take 2 tablets (650 mg total) by mouth every 6 (six) hours as needed for mild pain, fever or headache (or Fever >/= 101).  Marland Kitchen albuterol (PROVENTIL) (5 MG/ML) 0.5% nebulizer solution Take 0.5 mLs (2.5 mg total) by nebulization every 6 (six) hours as needed for wheezing or shortness of breath.  Marland Kitchen amLODipine (NORVASC) 10 MG tablet Take 5 mg by mouth at bedtime.   Marland Kitchen aspirin EC 81 MG tablet Take 81 mg by mouth daily.  Marland Kitchen BYSTOLIC 5 MG tablet Take 1 tablet by mouth once daily  . Calcium  Carbonate-Vitamin D (CALTRATE 600+D PO) Take 1 tablet by mouth daily with lunch.   . cetirizine (ZYRTEC) 10 MG tablet Take 10 mg by mouth daily.  Marland Kitchen esomeprazole (NEXIUM) 40 MG capsule Take 40 mg by mouth 2 (two) times daily before a meal.   . fluticasone (FLONASE) 50 MCG/ACT nasal spray Place 1 spray into both nostrils at bedtime.   . Fluticasone-Salmeterol (ADVAIR) 250-50 MCG/DOSE AEPB Inhale 1 puff into the lungs every 12 (twelve) hours.  Marland Kitchen losartan (COZAAR) 100 MG tablet Take 100 mg by mouth daily.  . Multiple Vitamin (MULTIVITAMIN) tablet Take 1 tablet by mouth daily with lunch.   . oxyCODONE (OXY IR/ROXICODONE) 5 MG immediate release tablet Take 1 tablet (5 mg total) by mouth every 6 (six) hours as needed for severe pain.  . potassium chloride SA (KLOR-CON) 20 MEQ tablet Take 60 mEq by mouth daily.  . rosuvastatin (CRESTOR) 5 MG tablet Take 5 mg by mouth daily.  . vitamin C (ASCORBIC  ACID) 500 MG tablet Take 500 mg by mouth daily.    Allergies  Allergen Reactions  . Ephedrine Shortness Of Breath  . Cortisone Other (See Comments)    Severe muscle spasms      Review of Systems negative except from HPI and PMH  Physical Exam BP 130/70   Pulse 70   Resp 15   Ht 5\' 8"  (1.727 m)   Wt 239 lb 6.4 oz (108.6 kg)   SpO2 99%   BMI 36.40 kg/m  Well developed and nourished in no acute distress HENT normal Neck supple with JVP-  8 + HJR Clear Regular rate and rhythm, no murmurs or gallops Abd-soft with active BS No Clubbing cyanosis edema Skin-warm and dry A & Oriented  Grossly normal sensory and motor function  ECG sinus at 71 Well 17/10/39 Otherwise normal      Assessment and  Plan  WCT probably SVT  HTN  HFpEF  Euvolemic continue current meds  Infrequent palpitations.  Blood pressure well controlled.  Anticipating Covid vaccine.

## 2019-04-18 DIAGNOSIS — Z853 Personal history of malignant neoplasm of breast: Secondary | ICD-10-CM | POA: Diagnosis not present

## 2019-04-18 DIAGNOSIS — R9431 Abnormal electrocardiogram [ECG] [EKG]: Secondary | ICD-10-CM | POA: Diagnosis not present

## 2019-04-18 DIAGNOSIS — Z8616 Personal history of COVID-19: Secondary | ICD-10-CM | POA: Diagnosis not present

## 2019-04-18 DIAGNOSIS — F419 Anxiety disorder, unspecified: Secondary | ICD-10-CM | POA: Diagnosis not present

## 2019-04-18 DIAGNOSIS — K5730 Diverticulosis of large intestine without perforation or abscess without bleeding: Secondary | ICD-10-CM | POA: Diagnosis not present

## 2019-04-18 DIAGNOSIS — H35042 Retinal micro-aneurysms, unspecified, left eye: Secondary | ICD-10-CM | POA: Diagnosis not present

## 2019-04-18 DIAGNOSIS — Z1331 Encounter for screening for depression: Secondary | ICD-10-CM | POA: Diagnosis not present

## 2019-04-18 DIAGNOSIS — Z0000 Encounter for general adult medical examination without abnormal findings: Secondary | ICD-10-CM | POA: Diagnosis not present

## 2019-04-18 DIAGNOSIS — Q078 Other specified congenital malformations of nervous system: Secondary | ICD-10-CM | POA: Diagnosis not present

## 2019-04-18 DIAGNOSIS — I10 Essential (primary) hypertension: Secondary | ICD-10-CM | POA: Diagnosis not present

## 2019-04-18 DIAGNOSIS — I519 Heart disease, unspecified: Secondary | ICD-10-CM | POA: Diagnosis not present

## 2019-04-18 DIAGNOSIS — K219 Gastro-esophageal reflux disease without esophagitis: Secondary | ICD-10-CM | POA: Diagnosis not present

## 2019-04-18 DIAGNOSIS — R82998 Other abnormal findings in urine: Secondary | ICD-10-CM | POA: Diagnosis not present

## 2019-04-18 DIAGNOSIS — E782 Mixed hyperlipidemia: Secondary | ICD-10-CM | POA: Diagnosis not present

## 2019-04-19 IMAGING — MR MR BILATERAL BREAST WITHOUT AND WITH CONTRAST
7 of 10 series · 33 of 48 positions shown · IV contrast (10m gadavist)
Comparison: Previous exam(s).

CLINICAL DATA: 72-year-old female with spontaneous bloody RIGHT
nipple discharge. Recent RETROAREOLAR RIGHT breast biopsy
demonstrating dense fibrosis and duct ectasia. History of LEFT
breast cancer in 7888 with lumpectomy. History of RIGHT breast
papilloma.

LABS:  Creatinine was obtained on site at [HOSPITAL] at [REDACTED] [HOSPITAL].
Results: Creatinine 0.6 mg/dL.  [id number].
EXAM:
BILATERAL BREAST MRI WITH AND WITHOUT CONTRAST
TECHNIQUE: Multiplanar, multisequence MR images of both breasts were obtained
prior to and following the intravenous administration of 10 ml of
Gadavist

[Series 2: t2_tirm_tra ipat (a-p) · axial · 3.0mm · 0.78mm/px · 1 of 72 slices shown]
[im 1/72]
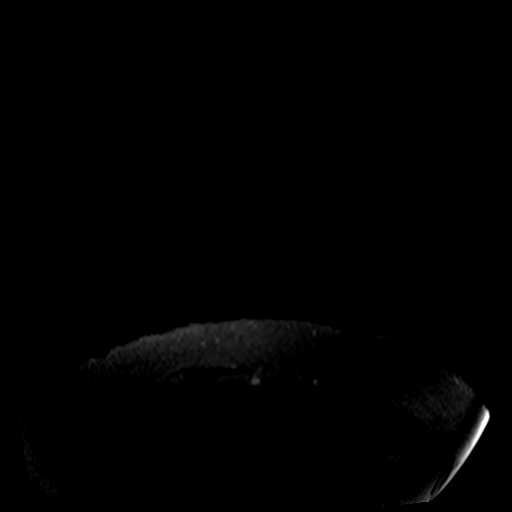

[Series 3: fl3d pre-cm no · axial · non-contrast · 1.2mm · 1.04mm/px · z∈[-119,+110]mm · 5 of 192 slices shown]
[im 1/192]
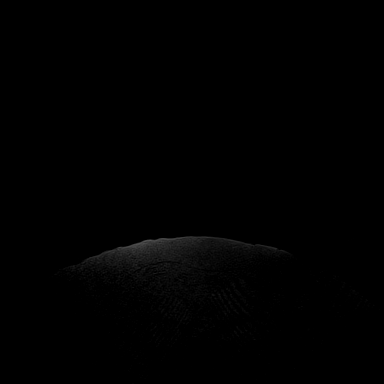
[im 48/192]
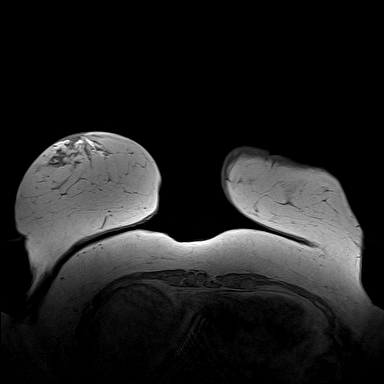
[im 96/192]
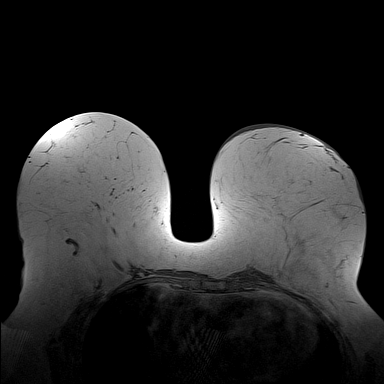
[im 144/192]
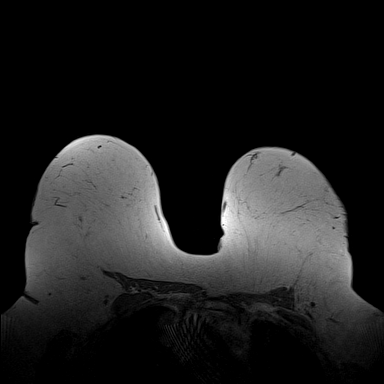
[im 192/192]
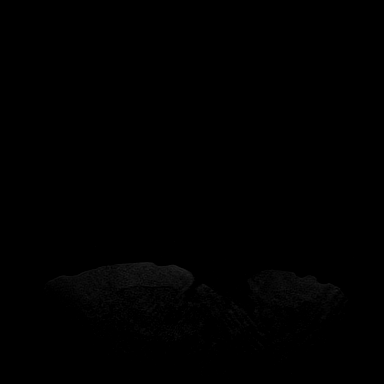

[Series 4: fl3d pre-cm · axial · non-contrast · 1.2mm · 1.04mm/px · z∈[-119,+110]mm · 5 of 192 slices shown]
[im 1/192]
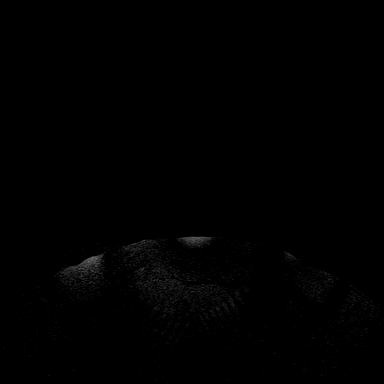
[im 48/192]
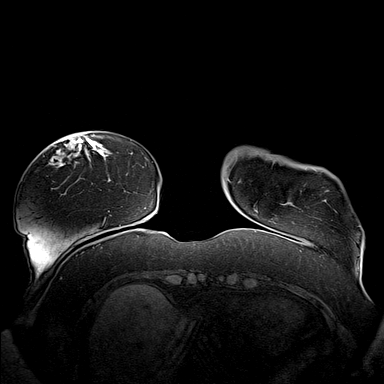
[im 96/192]
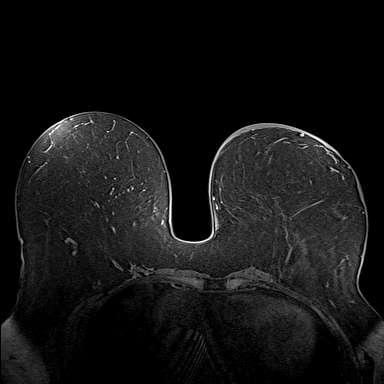
[im 144/192]
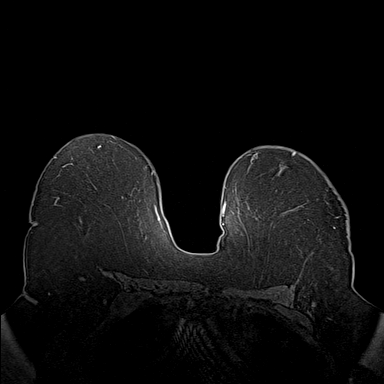
[im 192/192]
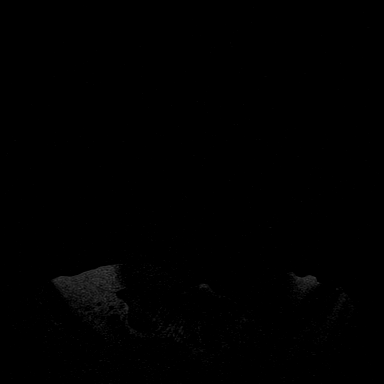

[Series 5: fl3d post-cm 20 · axial · 1.2mm · 1.04mm/px · z∈[-119,+110]mm · 6 of 192 slices shown (1 of 2)]
[im 1/192]
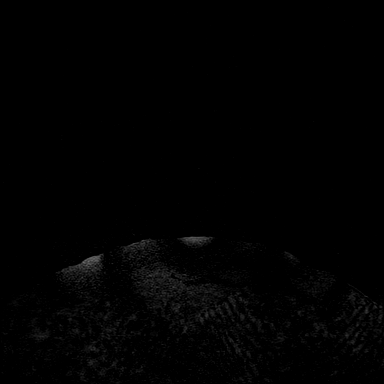
[im 39/192]
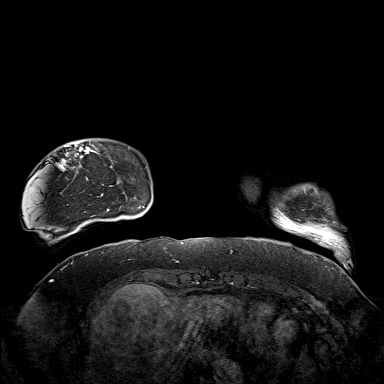
[im 77/192]
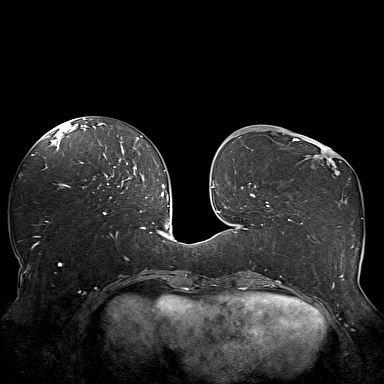
[im 115/192]
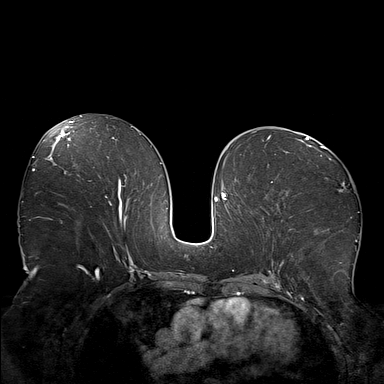
[im 153/192]
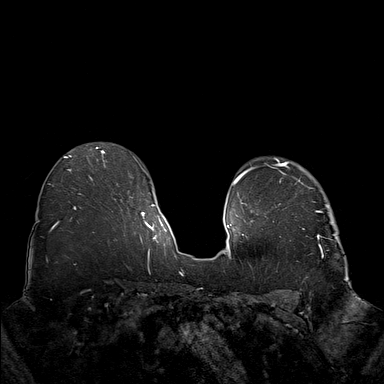
[im 192/192]
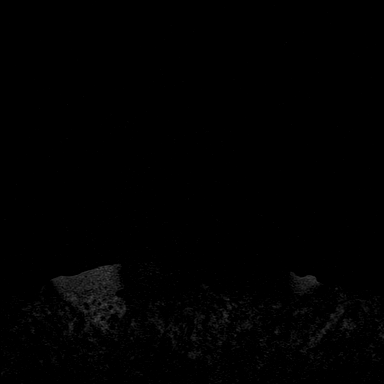

[Series 6: fl3d post-cm 20 · axial · 1.2mm · 1.04mm/px · z∈[-119,+110]mm · 6 of 192 slices shown (2 of 2)]
[im 1/192]
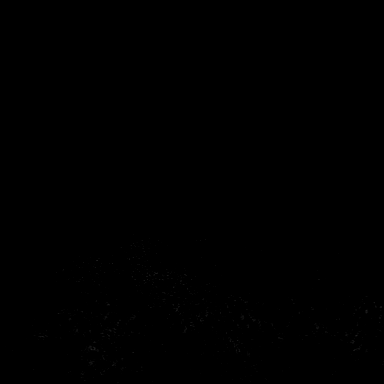
[im 39/192]
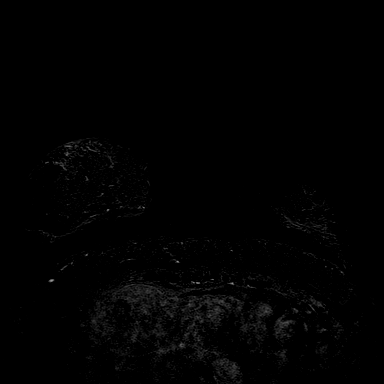
[im 77/192]
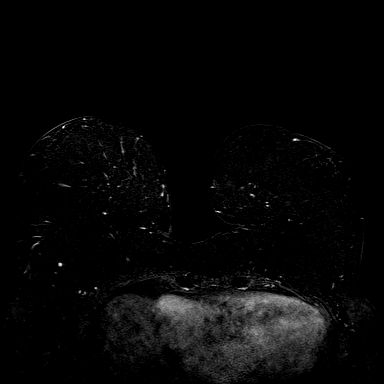
[im 115/192]
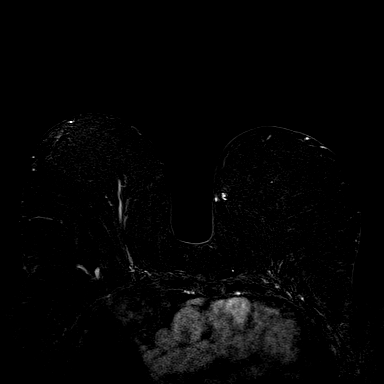
[im 153/192]
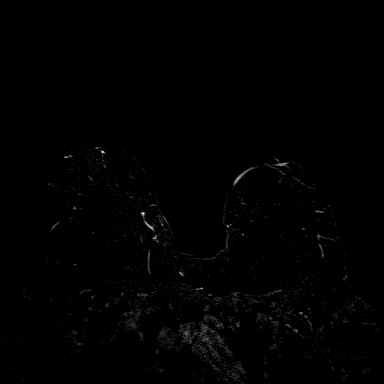
[im 192/192]
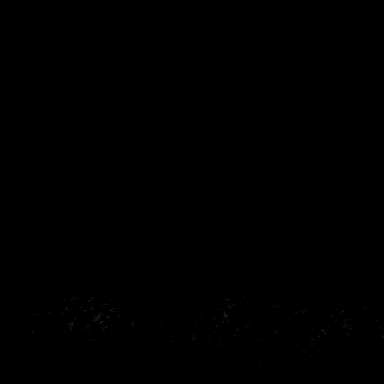

[Series 8: fl3d post-cm 3min · axial · 1.2mm · 1.04mm/px · z∈[-119,+110]mm · 6 of 192 slices shown]
[im 1/192]
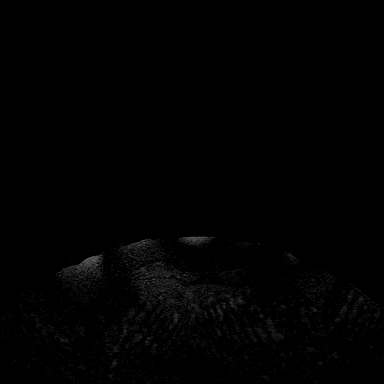
[im 39/192]
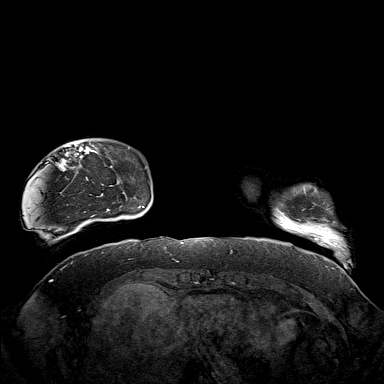
[im 77/192]
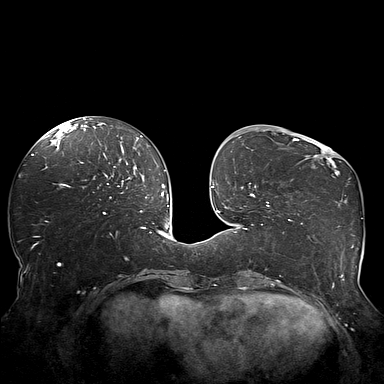
[im 115/192]
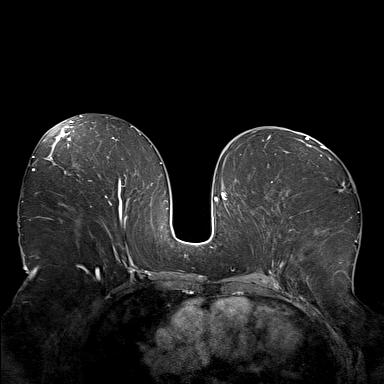
[im 153/192]
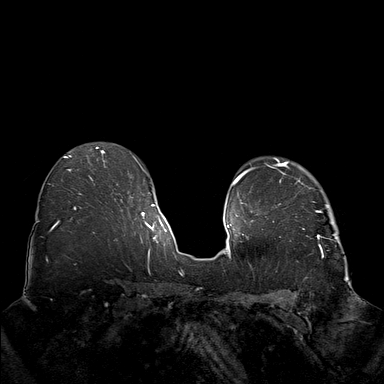
[im 192/192]
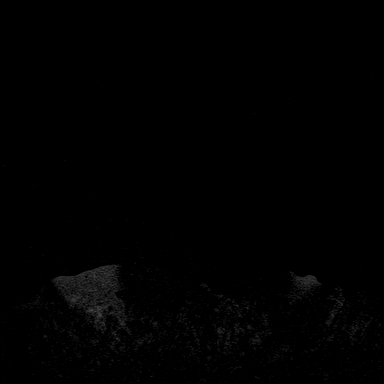

[Series 9: fl3d post-cm 3min_sub · axial · 1.2mm · 1.04mm/px · z∈[-119,+18]mm · 4 of 192 slices shown]
[im 1/192]
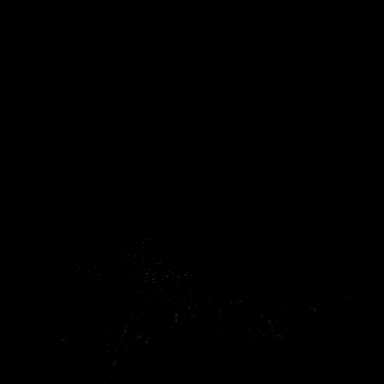
[im 39/192]
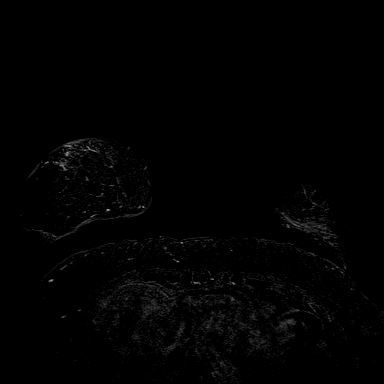
[im 77/192]
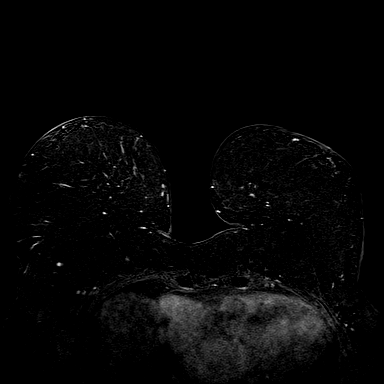
[im 115/192]
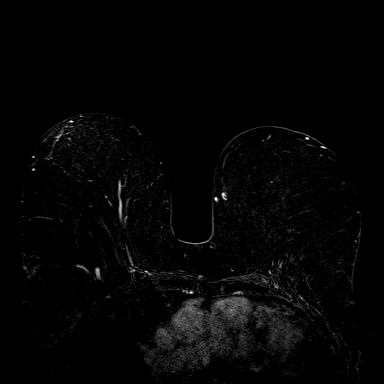

[33 of 48 positions shown; findings below may reference images not displayed]

Three-dimensional MR images were rendered by post-processing of the
original MR data on an independent workstation. The
three-dimensional MR images were interpreted, and findings are
reported in the following complete MRI report for this study. Three
dimensional images were evaluated at the independent [employer]
workstation
FINDINGS: Breast composition: a. Almost entirely fat.

Background parenchymal enhancement: Minimal

Right breast: Post biopsy changes within the RETROAREOLAR RIGHT
breast identified. No suspicious enhancement or mass identified.

Left breast: No mass or abnormal enhancement.

Lymph nodes: No abnormal appearing lymph nodes.

Ancillary findings:  None.
IMPRESSION: 1. Post biopsy changes within the RETROAREOLAR RIGHT breast. No
findings to suggest a cause of patient's bloody RIGHT nipple
discharge. Recommend surgical follow-up.
2. No mammographic evidence of breast malignancy.

RECOMMENDATION:
Surgical follow-up.

Bilateral screening mammogram in October 2018.

BI-RADS CATEGORY  2: Benign.

## 2019-04-22 DIAGNOSIS — Z1212 Encounter for screening for malignant neoplasm of rectum: Secondary | ICD-10-CM | POA: Diagnosis not present

## 2019-04-22 LAB — IFOBT (OCCULT BLOOD): IFOBT: NEGATIVE

## 2019-04-29 DIAGNOSIS — R21 Rash and other nonspecific skin eruption: Secondary | ICD-10-CM | POA: Diagnosis not present

## 2019-05-05 ENCOUNTER — Ambulatory Visit: Payer: Medicare Other | Attending: Internal Medicine

## 2019-05-05 DIAGNOSIS — Z23 Encounter for immunization: Secondary | ICD-10-CM

## 2019-05-05 NOTE — Progress Notes (Signed)
   Covid-19 Vaccination Clinic  Name:  Bridget Cortez    MRN: [id number] DOB: [birth year]  05/05/2019  Bridget Cortez was observed post Covid-19 immunization for 15 minutes without incident. She was provided with Vaccine Information Sheet and instruction to access the V-Safe system.   Bridget Cortez was instructed to call 911 with any severe reactions post vaccine: Marland Kitchen Difficulty breathing  . Swelling of face and throat  . A fast heartbeat  . A bad rash all over body  . Dizziness and weakness   Immunizations Administered    Name Date Dose VIS Date Route   Pfizer COVID-19 Vaccine 05/05/2019 12:13 PM 0.3 mL 01/03/2019 Intramuscular   Manufacturer: [place]   Lot: [id number]   [place]: [id number]

## 2019-05-13 DIAGNOSIS — L821 Other seborrheic keratosis: Secondary | ICD-10-CM | POA: Diagnosis not present

## 2019-05-13 DIAGNOSIS — L300 Nummular dermatitis: Secondary | ICD-10-CM | POA: Diagnosis not present

## 2019-05-13 DIAGNOSIS — L304 Erythema intertrigo: Secondary | ICD-10-CM | POA: Diagnosis not present

## 2019-05-26 ENCOUNTER — Ambulatory Visit: Payer: Medicare Other | Attending: Internal Medicine

## 2019-05-26 DIAGNOSIS — Z23 Encounter for immunization: Secondary | ICD-10-CM

## 2019-05-26 NOTE — Progress Notes (Signed)
   Covid-19 Vaccination Clinic  Name:  [name]    MRN: [id number] DOB: 22-Feb-1945  05/26/2019  [name] was observed post Covid-19 immunization for 15 minutes without incident. She was provided with Vaccine Information Sheet and instruction to access the V-Safe system.   [name] was instructed to call 911 with any severe reactions post vaccine: Marland Kitchen Difficulty breathing  . Swelling of face and throat  . A fast heartbeat  . A bad rash all over body  . Dizziness and weakness   Immunizations Administered    Name Date Dose VIS Date Route   Pfizer COVID-19 Vaccine 05/26/2019  1:58 PM 0.3 mL 03/19/2018 Intramuscular   Manufacturer: [place]   Lot: [id number]   [place]: [id number]

## 2019-05-27 DIAGNOSIS — D485 Neoplasm of uncertain behavior of skin: Secondary | ICD-10-CM | POA: Diagnosis not present

## 2019-05-27 DIAGNOSIS — R21 Rash and other nonspecific skin eruption: Secondary | ICD-10-CM | POA: Diagnosis not present

## 2019-05-27 DIAGNOSIS — L308 Other specified dermatitis: Secondary | ICD-10-CM | POA: Diagnosis not present

## 2019-05-27 DIAGNOSIS — L245 Irritant contact dermatitis due to other chemical products: Secondary | ICD-10-CM | POA: Diagnosis not present

## 2019-06-04 ENCOUNTER — Other Ambulatory Visit: Payer: Self-pay

## 2019-06-04 ENCOUNTER — Encounter (INDEPENDENT_AMBULATORY_CARE_PROVIDER_SITE_OTHER): Payer: Medicare Other | Admitting: Ophthalmology

## 2019-06-04 DIAGNOSIS — H3509 Other intraretinal microvascular abnormalities: Secondary | ICD-10-CM | POA: Diagnosis not present

## 2019-06-04 DIAGNOSIS — H43813 Vitreous degeneration, bilateral: Secondary | ICD-10-CM

## 2019-06-04 DIAGNOSIS — I10 Essential (primary) hypertension: Secondary | ICD-10-CM | POA: Diagnosis not present

## 2019-06-04 DIAGNOSIS — H35033 Hypertensive retinopathy, bilateral: Secondary | ICD-10-CM | POA: Diagnosis not present

## 2019-06-06 DIAGNOSIS — L259 Unspecified contact dermatitis, unspecified cause: Secondary | ICD-10-CM | POA: Diagnosis not present

## 2019-08-15 ENCOUNTER — Other Ambulatory Visit: Payer: Self-pay | Admitting: Orthopaedic Surgery

## 2019-08-15 ENCOUNTER — Telehealth: Payer: Self-pay | Admitting: Internal Medicine

## 2019-08-15 DIAGNOSIS — M1711 Unilateral primary osteoarthritis, right knee: Secondary | ICD-10-CM | POA: Diagnosis not present

## 2019-08-15 NOTE — Telephone Encounter (Signed)
   [facility] Pre-operative Risk Assessment    Request for surgical clearance:  1. What type of surgery is being performed? Right Knee Arthoplasty   2. When is this surgery scheduled? 09/09/19   3. What type of clearance is required (medical clearance vs. Pharmacy clearance to hold med vs. Both)? Both  4. Are there any medications that need to be held prior to surgery and how long? Aspirin - [facility] would like to inquire about whether or not medication needs to be held.   5. Practice name and name of physician performing surgery? [facility]  Dr. Melrose Nakayama   6. What is your office phone number? [phone]    7.   What is your office fax number? [phone]  8.   Anesthesia type (None, local, MAC, general) ? Spinal   Zara Council 08/15/2019, 2:02 PM  _________________________________________________________________   (provider comments below)

## 2019-08-15 NOTE — Telephone Encounter (Signed)
Bridget Cortez 74 year old female would like to have a right knee arthroplasty. She was last seen in the cardiology clinic on 04/15/2019. She was having occasional palpitations at that time that were relieved with sitting down. She had also had Covid in January. She was planning on getting the COVID-19 vaccination. May her aspirin be held for her upcoming surgery?  Her PMH includes heart murmur, SVT, HTN, ovarian cyst, pneumonia, breast cancer, arthritis, asthma, and community-acquired pneumonia.  Please direct response to CV DIV preop pool. Thank you for your help.  Jossie Ng. Keerat Denicola NP-C    08/15/2019, 2:32 PM [facility] [place] [address] Office [phone] Fax [phone]

## 2019-08-19 ENCOUNTER — Telehealth: Payer: Self-pay | Admitting: Internal Medicine

## 2019-08-19 NOTE — Telephone Encounter (Signed)
Follow Up:    Pt said she had juust spoke to [place]. She forgot to ask him if she need to stop taking her Bystolic for her surgery?

## 2019-08-19 NOTE — Telephone Encounter (Signed)
   Primary Cardiologist: Lauree Chandler, MD  Chart reviewed and patient contacted by phone today as part of pre-operative protocol coverage. Given past medical history and time since last visit, based on ACC/AHA guidelines, Bridget Cortez would be at acceptable risk for the planned procedure without further cardiovascular testing.   OK to hold aspirin 5-7 days pre op. Resume post op when safe.   I will route this recommendation to the requesting party via Epic fax function and remove from pre-op pool.  Please call with questions.  Kerin Ransom, [name] 08/19/2019, 9:21 AM

## 2019-08-19 NOTE — Telephone Encounter (Signed)
I called her back and left a message for her to continue her other medications.  Kerin Ransom [name] 08/19/2019 10:50 AM

## 2019-08-20 DIAGNOSIS — L300 Nummular dermatitis: Secondary | ICD-10-CM | POA: Diagnosis not present

## 2019-08-20 DIAGNOSIS — L821 Other seborrheic keratosis: Secondary | ICD-10-CM | POA: Diagnosis not present

## 2019-08-20 DIAGNOSIS — L304 Erythema intertrigo: Secondary | ICD-10-CM | POA: Diagnosis not present

## 2019-08-25 DIAGNOSIS — I10 Essential (primary) hypertension: Secondary | ICD-10-CM | POA: Diagnosis not present

## 2019-08-25 DIAGNOSIS — Z853 Personal history of malignant neoplasm of breast: Secondary | ICD-10-CM | POA: Diagnosis not present

## 2019-08-25 DIAGNOSIS — I519 Heart disease, unspecified: Secondary | ICD-10-CM | POA: Diagnosis not present

## 2019-08-25 DIAGNOSIS — K219 Gastro-esophageal reflux disease without esophagitis: Secondary | ICD-10-CM | POA: Diagnosis not present

## 2019-08-25 DIAGNOSIS — E785 Hyperlipidemia, unspecified: Secondary | ICD-10-CM | POA: Diagnosis not present

## 2019-08-25 DIAGNOSIS — J4520 Mild intermittent asthma, uncomplicated: Secondary | ICD-10-CM | POA: Diagnosis not present

## 2019-08-25 DIAGNOSIS — M25561 Pain in right knee: Secondary | ICD-10-CM | POA: Diagnosis not present

## 2019-09-02 NOTE — Patient Instructions (Addendum)
DUE TO COVID-19 ONLY ONE VISITOR IS ALLOWED TO COME WITH YOU AND STAY IN THE WAITING ROOM ONLY DURING PRE OP AND PROCEDURE DAY OF SURGERY. THE 1 VISITOR  MAY VISIT WITH YOU AFTER SURGERY IN YOUR PRIVATE ROOM DURING VISITING HOURS ONLY!  YOU NEED TO HAVE A COVID 19 TEST ON: 09/05/19  @ 1:00 pm , THIS TEST MUST BE DONE BEFORE SURGERY,  COVID TESTING SITE [place] [address], IT IS ON THE RIGHT GOING OUT [address] APPROXIMATELY  2 MINUTES PAST [employer] ON THE RIGHT. ONCE YOUR COVID TEST IS COMPLETED,  PLEASE BEGIN THE QUARANTINE INSTRUCTIONS AS OUTLINED IN YOUR HANDOUT.                Bridget Cortez    Your procedure is scheduled on: 09/09/19   Report to [facility] Main  Entrance   Report to admitting at: 10:00 AM     Call this number if you have problems the morning of surgery [phone]    Remember:   NO SOLID FOOD AFTER MIDNIGHT THE NIGHT PRIOR TO SURGERY. NOTHING BY MOUTH EXCEPT CLEAR LIQUIDS UNTIL: 9:30 am . PLEASE FINISH ENSURE DRINK PER SURGEON ORDER  WHICH NEEDS TO BE COMPLETED AT: 9:30 am .  CLEAR LIQUID DIET   Foods Allowed                                                                     Foods Excluded  Coffee and tea, regular and decaf                             liquids that you cannot  Plain Jell-O any favor except red or purple                                           see through such as: Fruit ices (not with fruit pulp)                                     milk, soups, orange juice  Iced Popsicles                                    All solid food Carbonated beverages, regular and diet                                    Cranberry, grape and apple juices Sports drinks like Gatorade Lightly seasoned clear broth or consume(fat free) Sugar, honey syrup  Sample Menu Breakfast                                Lunch  Supper Cranberry juice                    Beef broth                             Chicken broth Jell-O                                     Grape juice                           Apple juice Coffee or tea                        Jell-O                                      Popsicle                                                Coffee or tea                        Coffee or tea  _____________________________________________________________________  BRUSH YOUR TEETH MORNING OF SURGERY AND RINSE YOUR MOUTH OUT, NO CHEWING GUM CANDY OR MINTS.     Take these medicines the morning of surgery with A SIP OF WATER: amlodipine,cetirizine,bystolic,esomeprazole.Use inhalers and flonase as usual.                                You may not have any metal on your body including hair pins and              piercings  Do not wear jewelry, make-up, lotions, powders or perfumes, deodorant             Do not wear nail polish on your fingernails.  Do not shave  48 hours prior to surgery.    Do not bring valuables to the hospital. [place].  Contacts, dentures or bridgework may not be worn into surgery.  Leave suitcase in the car. After surgery it may be brought to your room.     Patients discharged the day of surgery will not be allowed to drive home. IF YOU ARE HAVING SURGERY AND GOING HOME THE SAME DAY, YOU MUST HAVE AN ADULT TO DRIVE YOU HOME AND BE WITH YOU FOR 24 HOURS. YOU MAY GO HOME BY TAXI OR UBER OR ORTHERWISE, BUT AN ADULT MUST ACCOMPANY YOU HOME AND STAY WITH YOU FOR 24 HOURS.  Name and phone number of your driver:  Special Instructions: N/A              Please read over the following fact sheets you were given: _____________________________________________________________________          [facility] - Preparing for Surgery Before surgery, you can play an important role.  Because skin is not sterile, your skin needs to be as free  of germs as possible.  You can reduce the number of germs on your skin by washing  with CHG (chlorahexidine gluconate) soap before surgery.  CHG is an antiseptic cleaner which kills germs and bonds with the skin to continue killing germs even after washing. Please DO NOT use if you have an allergy to CHG or antibacterial soaps.  If your skin becomes reddened/irritated stop using the CHG and inform your nurse when you arrive at Short Stay. Do not shave (including legs and underarms) for at least 48 hours prior to the first CHG shower.  You may shave your face/neck. Please follow these instructions carefully:  1.  Shower with CHG Soap the night before surgery and the  morning of Surgery.  2.  If you choose to wash your hair, wash your hair first as usual with your  normal  shampoo.  3.  After you shampoo, rinse your hair and body thoroughly to remove the  shampoo.                           4.  Use CHG as you would any other liquid soap.  You can apply chg directly  to the skin and wash                       Gently with a scrungie or clean washcloth.  5.  Apply the CHG Soap to your body ONLY FROM THE NECK DOWN.   Do not use on face/ open                           Wound or open sores. Avoid contact with eyes, ears mouth and genitals (private parts).                       Wash face,  Genitals (private parts) with your normal soap.             6.  Wash thoroughly, paying special attention to the area where your surgery  will be performed.  7.  Thoroughly rinse your body with warm water from the neck down.  8.  DO NOT shower/wash with your normal soap after using and rinsing off  the CHG Soap.                9.  Pat yourself dry with a clean towel.            10.  Wear clean pajamas.            11.  Place clean sheets on your bed the night of your first shower and do not  sleep with pets. Day of Surgery : Do not apply any lotions/deodorants the morning of surgery.  Please wear clean clothes to the hospital/surgery center.  FAILURE TO FOLLOW THESE INSTRUCTIONS MAY RESULT IN THE  CANCELLATION OF YOUR SURGERY PATIENT SIGNATURE_________________________________  NURSE SIGNATURE__________________________________  ________________________________________________________________________   Bridget Cortez  An incentive spirometer is a tool that can help keep your lungs clear and active. This tool measures how well you are filling your lungs with each breath. Taking long deep breaths may help reverse or decrease the chance of developing breathing (pulmonary) problems (especially infection) following:  A long period of time when you are unable to move or be active. BEFORE THE PROCEDURE   If the spirometer includes an indicator to show your best effort, your  nurse or respiratory therapist will set it to a desired goal.  If possible, sit up straight or lean slightly forward. Try not to slouch.  Hold the incentive spirometer in an upright position. INSTRUCTIONS FOR USE  1. Sit on the edge of your bed if possible, or sit up as far as you can in bed or on a chair. 2. Hold the incentive spirometer in an upright position. 3. Breathe out normally. 4. Place the mouthpiece in your mouth and seal your lips tightly around it. 5. Breathe in slowly and as deeply as possible, raising the piston or the ball toward the top of the column. 6. Hold your breath for 3-5 seconds or for as long as possible. Allow the piston or ball to fall to the bottom of the column. 7. Remove the mouthpiece from your mouth and breathe out normally. 8. Rest for a few seconds and repeat Steps 1 through 7 at least 10 times every 1-2 hours when you are awake. Take your time and take a few normal breaths between deep breaths. 9. The spirometer may include an indicator to show your best effort. Use the indicator as a goal to work toward during each repetition. 10. After each set of 10 deep breaths, practice coughing to be sure your lungs are clear. If you have an incision (the cut made at the time of surgery),  support your incision when coughing by placing a pillow or rolled up towels firmly against it. Once you are able to get out of bed, walk around indoors and cough well. You may stop using the incentive spirometer when instructed by your caregiver.  RISKS AND COMPLICATIONS  Take your time so you do not get dizzy or light-headed.  If you are in pain, you may need to take or ask for pain medication before doing incentive spirometry. It is harder to take a deep breath if you are having pain. AFTER USE  Rest and breathe slowly and easily.  It can be helpful to keep track of a log of your progress. Your caregiver can provide you with a simple table to help with this. If you are using the spirometer at home, follow these instructions: [place] IF:   You are having difficultly using the spirometer.  You have trouble using the spirometer as often as instructed.  Your pain medication is not giving enough relief while using the spirometer.  You develop fever of 100.5 F (38.1 C) or higher. SEEK IMMEDIATE MEDICAL CARE IF:   You cough up bloody sputum that had not been present before.  You develop fever of 102 F (38.9 C) or greater.  You develop worsening pain at or near the incision site. MAKE SURE YOU:   Understand these instructions.  Will watch your condition.  Will get help right away if you are not doing well or get worse. Document Released: 05/22/2006 Document Revised: 04/03/2011 Document Reviewed: 07/23/2006 [facility] Patient Information 2014 [place], [place].   ________________________________________________________________________

## 2019-09-03 ENCOUNTER — Encounter (HOSPITAL_COMMUNITY)
Admission: RE | Admit: 2019-09-03 | Discharge: 2019-09-03 | Disposition: A | Discharge status: Home / Self Care | Payer: Medicare Other | Source: Ambulatory Visit | Attending: Orthopaedic Surgery | Admitting: Orthopaedic Surgery

## 2019-09-03 ENCOUNTER — Encounter (HOSPITAL_COMMUNITY): Payer: Self-pay

## 2019-09-03 ENCOUNTER — Other Ambulatory Visit (HOSPITAL_COMMUNITY): Payer: Medicare Other

## 2019-09-03 ENCOUNTER — Other Ambulatory Visit: Payer: Self-pay

## 2019-09-03 DIAGNOSIS — Z01818 Encounter for other preprocedural examination: Secondary | ICD-10-CM | POA: Diagnosis not present

## 2019-09-03 HISTORY — DX: Cardiac arrhythmia, unspecified: I49.9

## 2019-09-03 LAB — CBC WITH DIFFERENTIAL/PLATELET
Abs Immature Granulocytes: 0.01 10*3/uL (ref 0.00–0.07)
Basophils Absolute: 0 10*3/uL (ref 0.0–0.1)
Basophils Relative: 1 %
Eosinophils Absolute: 0.1 10*3/uL (ref 0.0–0.5)
Eosinophils Relative: 3 %
HCT: 39.3 % (ref 36.0–46.0)
Hemoglobin: 12.8 g/dL (ref 12.0–15.0)
Immature Granulocytes: 0 %
Lymphocytes Relative: 34 %
Lymphs Abs: 1.4 10*3/uL (ref 0.7–4.0)
MCH: 29.1 pg (ref 26.0–34.0)
MCHC: 32.6 g/dL (ref 30.0–36.0)
MCV: 89.3 fL (ref 80.0–100.0)
Monocytes Absolute: 0.5 10*3/uL (ref 0.1–1.0)
Monocytes Relative: 12 %
Neutro Abs: 2.1 10*3/uL (ref 1.7–7.7)
Neutrophils Relative %: 50 %
Platelets: 225 10*3/uL (ref 150–400)
RBC: 4.4 MIL/uL (ref 3.87–5.11)
RDW: 14.8 % (ref 11.5–15.5)
WBC: 4.1 10*3/uL (ref 4.0–10.5)
nRBC: 0 % (ref 0.0–0.2)

## 2019-09-03 LAB — URINALYSIS, ROUTINE W REFLEX MICROSCOPIC
Bacteria, UA: NONE SEEN
Bilirubin Urine: NEGATIVE
Glucose, UA: NEGATIVE mg/dL
Hgb urine dipstick: NEGATIVE
Ketones, ur: NEGATIVE mg/dL
Nitrite: NEGATIVE
Protein, ur: NEGATIVE mg/dL
Specific Gravity, Urine: 1.012 (ref 1.005–1.030)
pH: 8 (ref 5.0–8.0)

## 2019-09-03 LAB — BASIC METABOLIC PANEL
Anion gap: 8 (ref 5–15)
BUN: 13 mg/dL (ref 8–23)
CO2: 26 mmol/L (ref 22–32)
Calcium: 9.1 mg/dL (ref 8.9–10.3)
Chloride: 101 mmol/L (ref 98–111)
Creatinine, Ser: 0.61 mg/dL (ref 0.44–1.00)
GFR calc Af Amer: >60 mL/min (ref >60)
GFR calc non Af Amer: >60 mL/min (ref >60)
Glucose, Bld: 76 mg/dL (ref 70–99)
Potassium: 4 mmol/L (ref 3.5–5.1)
Sodium: 135 mmol/L (ref 135–145)

## 2019-09-03 LAB — PROTIME-INR
INR: 1 (ref 0.8–1.2)
Prothrombin Time: 12.7 seconds (ref 11.4–15.2)

## 2019-09-03 LAB — APTT: aPTT: 46 seconds — ABNORMAL HIGH (ref 24–36)

## 2019-09-03 NOTE — Progress Notes (Signed)
COVID Vaccine Completed: yes Date COVID Vaccine completed:05/26/19 COVID vaccine manufacturer: *Pf[employer]    [employer]   PCP - Dr. [name] Cardiologist - Dr. Charyl Dancer. CLEARANCE: Dr. Virl Axe: 08/15/19: epic.  Chest x-ray -  EKG - 04/15/19 epic Stress Test -  ECHO - 08/04/17. Cardiac Cath -   Sleep Study -  CPAP -   Fasting Blood Sugar -  Checks Blood Sugar _____ times a day  Blood Thinner Instructions:by Dr. Virl Axe Aspirin Instructions: HOLD 5-7 DAYS PRIOR SURGERY. Last Dose:09/02/19  Anesthesia review: Hx: HTN,STV. Pt. Unable to walk due to her right knee pain.She's able to ambulate only few steps at the time.  Patient denies shortness of breath, fever, cough and chest pain at PAT appointment   Patient verbalized understanding of instructions that were given to them at the PAT appointment. Patient was also instructed that they will need to review over the PAT instructions again at home before surgery.

## 2019-09-04 LAB — SURGICAL PCR SCREEN
MRSA, PCR: POSITIVE — AB
Staphylococcus aureus: POSITIVE — AB

## 2019-09-04 NOTE — Progress Notes (Signed)
Lab results:PTT: 55.

## 2019-09-04 NOTE — Anesthesia Preprocedure Evaluation (Addendum)
Anesthesia Evaluation  Patient identified by MRN, date of birth, ID band Patient awake    Reviewed: Allergy & Precautions, NPO status , Patient's Chart, lab work & pertinent test results  History of Anesthesia Complications Negative for: history of anesthetic complications  Airway Mallampati: III  TM Distance: >3 FB Neck ROM: Full    Dental  (+) Dental Advisory Given   Pulmonary neg shortness of breath, asthma , neg COPD, neg recent URI, former smoker,    breath sounds clear to auscultation       Cardiovascular hypertension, Pt. on medications (-) angina(-) Past MI  Rhythm:Regular  Left ventricle: The cavity size was normal. Wall thickness was  increased in a pattern of mild LVH. Systolic function was normal.  The estimated ejection fraction was in the range of 55% to 60%.  Wall motion was normal; there were no regional wall motion  abnormalities. Indeterminate diastolic function.  - Aortic valve: Mildly calcified annulus. Trileaflet; mildly  calcified leaflets.  - Mitral valve: Mildly calcified annulus. There was mild  regurgitation.  - Left atrium: The atrium was at the upper limits of normal in  size.  - Right atrium: The atrium was mildly dilated. Central venous  pressure (est): 3 mm Hg.  - Atrial septum: No defect or patent foramen ovale was identified.  - Tricuspid valve: There was mild regurgitation.  - Pulmonary arteries: PA peak pressure: 23 mm Hg (S).  - Pericardium, extracardiac: A small pericardial effusion was  identified posterior to the heart.    Neuro/Psych PSYCHIATRIC DISORDERS Anxiety negative neurological ROS     GI/Hepatic Neg liver ROS, GERD  Medicated and Controlled,  Endo/Other  negative endocrine ROS  Renal/GU negative Renal ROS     Musculoskeletal  (+) Arthritis ,   Abdominal   Peds  Hematology negative hematology ROS (+)   Anesthesia Other Findings    Reproductive/Obstetrics                           Anesthesia Physical Anesthesia Plan  ASA: II  Anesthesia Plan: General and Regional   Post-op Pain Management:    Induction: Intravenous  PONV Risk Score and Plan: 3 and Ondansetron and Dexamethasone  Airway Management Planned: LMA and Oral ETT  Additional Equipment: None  Intra-op Plan:   Post-operative Plan: Extubation in OR  Informed Consent: I have reviewed the patients History and Physical, chart, labs and discussed the procedure including the risks, benefits and alternatives for the proposed anesthesia with the patient or authorized representative who has indicated his/her understanding and acceptance.     Dental advisory given  Plan Discussed with: CRNA and Surgeon  Anesthesia Plan Comments: (Per cardiology preoperative risk assessment 08/19/19, "Chart reviewed and patient contacted by phone today as part of pre-operative protocol coverage. Given past medical history and time since last visit, based on ACC/AHA guidelines, Kanisha Duba would be at acceptable risk for the planned procedure without further cardiovascular testing.  OK to hold aspirin 5-7 days pre op. Resume post op when safe.")       Anesthesia Quick Evaluation

## 2019-09-05 ENCOUNTER — Other Ambulatory Visit (HOSPITAL_COMMUNITY)
Admission: RE | Admit: 2019-09-05 | Discharge: 2019-09-05 | Disposition: A | Discharge status: Home / Self Care | Payer: Medicare Other | Source: Ambulatory Visit | Attending: Orthopaedic Surgery | Admitting: Orthopaedic Surgery

## 2019-09-05 DIAGNOSIS — H40023 Open angle with borderline findings, high risk, bilateral: Secondary | ICD-10-CM | POA: Diagnosis not present

## 2019-09-05 DIAGNOSIS — Z20822 Contact with and (suspected) exposure to covid-19: Secondary | ICD-10-CM | POA: Diagnosis not present

## 2019-09-05 DIAGNOSIS — Z01812 Encounter for preprocedural laboratory examination: Secondary | ICD-10-CM | POA: Insufficient documentation

## 2019-09-05 LAB — SARS CORONAVIRUS 2 (TAT 6-24 HRS): SARS Coronavirus 2: NEGATIVE

## 2019-09-08 MED ORDER — TRANEXAMIC ACID 1000 MG/10ML IV SOLN
2000.0000 mg | INTRAVENOUS | Status: DC
  Filled 2019-09-08: qty 20

## 2019-09-08 MED ORDER — BUPIVACAINE LIPOSOME 1.3 % IJ SUSP
20.0000 mL | Freq: Once | INTRAMUSCULAR | Status: DC
  Filled 2019-09-08: qty 20

## 2019-09-08 MED ORDER — VANCOMYCIN HCL 1500 MG/300ML IV SOLN
1500.0000 mg | INTRAVENOUS | Status: AC
  Administered 2019-09-09: 1500 mg via INTRAVENOUS
  Filled 2019-09-08: qty 300

## 2019-09-08 NOTE — Care Plan (Signed)
Ortho Bundle Case Management Note  Patient Details  Name: Bridget Cortez MRN: [id number] Date of Birth: 11/03/1945   spoke with patient prior to surgery. She will discharge to home with family. She has a walker, 3n1 ordered. HHPT and bath aide arranged with [facility]. They will call if further orders needed.  Patient and MD in agreement with plan. Choice offered.                   DME Arranged:  Bedside commode DME Agency:  [employer]  HH Arranged:  PT, Nurse's Aide [place] Agency:  [facility] (formerly [facility])  Additional Comments: Please contact me with any questions of if this plan should need to change.  Ladell Heads,  [place] [facility]ist  [phone] 09/08/2019, 10:03 AM

## 2019-09-08 NOTE — H&P (Signed)
TOTAL KNEE ADMISSION H&P  Patient is being admitted for right total knee arthroplasty.  Subjective:  Chief Complaint:right knee pain.  HPI: Bridget Cortez, 74 y.o. female, has a history of pain and functional disability in the right knee due to arthritis and has failed non-surgical conservative treatments for greater than 12 weeks to includeNSAID's and/or analgesics, corticosteriod injections, viscosupplementation injections, flexibility and strengthening excercises, supervised PT with diminished ADL's post treatment, use of assistive devices, weight reduction as appropriate and activity modification.  Onset of symptoms was gradual, starting 5 years ago with gradually worsening course since that time. The patient noted no past surgery on the right knee(s).  Patient currently rates pain in the right knee(s) at 10 out of 10 with activity. Patient has night pain, worsening of pain with activity and weight bearing, pain that interferes with activities of daily living, crepitus and joint swelling.  Patient has evidence of subchondral cysts, subchondral sclerosis, periarticular osteophytes and joint space narrowing by imaging studies. There is no active infection.  Patient Active Problem List   Diagnosis Date Noted  . (HFpEF) heart failure with preserved ejection fraction ([place]) 04/13/2019  . SVT (supraventricular tachycardia) ([place]) 08/04/2017  . Wide-complex tachycardia ([place]) 08/03/2017  . Asthma 08/06/2014  . Anxiety about health 08/06/2014  . Essential hypertension 08/06/2014  . Personal history of colonic adenomas 03/20/2012  . Chiari malformation   . Heart murmur   . H/O varicella   . Yeast infection   . Cancer ([place])   . H/O osteopenia   . Abnormal Pap smear   . Vulvar intraepithelial neoplasia III (VIN III) 04/12/2009  . Atrophy of vagina 06/23/2005   Past Medical History:  Diagnosis Date  . Abnormal Pap smear 2006   vaginal medicine according to patient  . Allergy   .  Arthritis   . Asthma   . 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 Breast cancer ([place]) 1999   Left  . CAP (community acquired pneumonia) 08/06/2014  . Cataract    cataracts lt. eye    cataract removed.  . Chiari malformation   . Diverticulosis   . Dysrhythmia    SVT  . GERD (gastroesophageal reflux disease)   . H/O osteopenia    08/2006  . H/O varicella   . Heart murmur   . Hypertension   . Monilial vaginitis 07/2007  . Multiple allergies   . Ovarian cyst   . Personal history of colonic adenomas 03/13/2012  . Personal history of radiation therapy 1990  . Pneumonia 07/2014  . 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 Yeast infection     Past Surgical History:  Procedure Laterality Date  . ABDOMINAL HYSTERECTOMY    . ABDOMINAL SURGERY    . BREAST EXCISIONAL BIOPSY Right    benign  . BREAST LUMPECTOMY Left 1990  . BREAST SURGERY     Lumpectomy, left breast  . CATARACT EXTRACTION Left 2018  . COLONOSCOPY    . COLONOSCOPY W/ BIOPSIES    . EYE SURGERY    . HERNIA REPAIR    . KNEE SURGERY     right  . lt. eye aneurysm surgery Left 2017  . MOUTH SURGERY  2019   rt. side gum surgery  Lt. done 3 years ago  . RADIOACTIVE SEED GUIDED EXCISIONAL BREAST BIOPSY Right 07/09/2018   Procedure: RADIOACTIVE SEED GUIDED EXCISIONAL RIGHT  BREAST BIOPSY;  Surgeon: Stark Klein, MD;  Location: [place];  Service: General;  Laterality: Right;  Current Facility-Administered Medications  Medication Dose Route Frequency Provider Last Rate Last Admin  . [START ON 09/09/2019] bupivacaine liposome (EXPAREL) 1.3 % injection 266 mg  20 mL Other Once [place] Nakayama, MD      . Derrill Memo ON 09/09/2019] tranexamic acid (CYKLOKAPRON) 2,000 mg in sodium chloride 0.9 % 50 mL Topical Application  8,341 mg Topical To OR [place] Nakayama, MD      . Derrill Memo ON 09/09/2019] vancomycin (VANCOREADY) IVPB 1500 mg/300 mL  1,500 mg Intravenous 30 min Pre-Op [place] Nakayama, MD       Current Outpatient Medications   Medication Sig Dispense Refill Last Dose  . acetaminophen (TYLENOL) 325 MG tablet Take 2 tablets (650 mg total) by mouth every 6 (six) hours as needed for mild pain, fever or headache (or Fever >/= 101).     Marland Kitchen albuterol (PROVENTIL) (5 MG/ML) 0.5% nebulizer solution Take 0.5 mLs (2.5 mg total) by nebulization every 6 (six) hours as needed for wheezing or shortness of breath. 20 mL 12   . amLODipine (NORVASC) 10 MG tablet Take 5 mg by mouth at bedtime.      Marland Kitchen aspirin EC 81 MG tablet Take 81 mg by mouth daily.     Marland Kitchen BYSTOLIC 5 MG tablet Take 1 tablet by mouth once daily (Patient taking differently: Take 5 mg by mouth daily. ) 90 tablet 1   . Calcium Carbonate-Vitamin D (CALTRATE 600+D PO) Take 1 tablet by mouth daily with lunch.      . cetirizine (ZYRTEC) 10 MG tablet Take 10 mg by mouth daily.     Marland Kitchen esomeprazole (NEXIUM) 40 MG capsule Take 40 mg by mouth 2 (two) times daily before a meal.      . fluticasone (FLONASE) 50 MCG/ACT nasal spray Place 1 spray into both nostrils at bedtime.      . Fluticasone-Salmeterol (ADVAIR) 250-50 MCG/DOSE AEPB Inhale 1 puff into the lungs every 12 (twelve) hours.     Marland Kitchen losartan (COZAAR) 100 MG tablet Take 100 mg by mouth daily.     . Multiple Vitamin (MULTIVITAMIN) tablet Take 1 tablet by mouth daily with lunch.      . potassium chloride SA (KLOR-CON) 20 MEQ tablet Take 60 mEq by mouth daily.     . rosuvastatin (CRESTOR) 5 MG tablet Take 5 mg by mouth daily.     . simethicone (MYLICON) 962 MG chewable tablet Chew 125 mg by mouth every 6 (six) hours as needed for flatulence.     . vitamin C (ASCORBIC ACID) 500 MG tablet Take 500 mg by mouth daily.     Marland Kitchen oxyCODONE (OXY IR/ROXICODONE) 5 MG immediate release tablet Take 1 tablet (5 mg total) by mouth every 6 (six) hours as needed for severe pain. (Patient not taking: Reported on 08/21/2019) 10 tablet 0 Not Taking at Unknown time   Allergies  Allergen Reactions  . Ephedrine Shortness Of Breath  . Cortisone Other (See  Comments)    Severe muscle spasms    Social History   Tobacco Use  . Smoking status: Former Smoker    Quit date: 01/23/1978    Years since quitting: 41.6  . Smokeless tobacco: Never Used  Substance Use Topics  . Alcohol use: No    Family History  Problem Relation Age of Onset  . Cancer Mother   . Heart failure Other   . Colon cancer Neg Hx   . Esophageal cancer Neg Hx   . Rectal cancer Neg Hx   . Stomach  cancer Neg Hx   . Colon polyps Neg Hx      Review of Systems  Musculoskeletal: Positive for arthralgias.       Right knee  All other systems reviewed and are negative.   Objective:  Physical Exam Constitutional:      Appearance: Normal appearance.  HENT:     Head: Normocephalic and atraumatic.     Mouth/Throat:     Pharynx: Oropharynx is clear.  Eyes:     Extraocular Movements: Extraocular movements intact.  Cardiovascular:     Rate and Rhythm: Normal rate.  Pulmonary:     Effort: Pulmonary effort is normal.  Abdominal:     Palpations: Abdomen is soft.  Musculoskeletal:     Cervical back: Normal range of motion.     Comments: She walks with a steady gait using a cane.  Her range of motion at the right knee is about 0-95.  Calf is soft and nontender.  She has lateral and medial joint line pain with crepitation.  I do not feel an effusion.  Hip motion does not cause any leg pain and straight leg raise is negative.    Skin:    General: Skin is warm and dry.  Neurological:     General: No focal deficit present.     Mental Status: She is alert and oriented to person, place, and time. Mental status is at baseline.  Psychiatric:        Mood and Affect: Mood normal.        Behavior: Behavior normal.        Thought Content: Thought content normal.        Judgment: Judgment normal.     Vital signs in last 24 hours:    Labs:   Estimated body mass index is 36.4 kg/m as calculated from the following:   Height as of 09/03/19: 5\' 8"  (1.727 m).   Weight as of  04/15/19: 108.6 kg.   Imaging Review Plain radiographs demonstrate severe degenerative joint disease of the right knee(s). The overall alignment isneutral. The bone quality appears to be good for age and reported activity level.      Assessment/Plan:  End stage primary arthritis, right knee   The patient history, physical examination, clinical judgment of the provider and imaging studies are consistent with end stage degenerative joint disease of the right knee(s) and total knee arthroplasty is deemed medically necessary. The treatment options including medical management, injection therapy arthroscopy and arthroplasty were discussed at length. The risks and benefits of total knee arthroplasty were presented and reviewed. The risks due to aseptic loosening, infection, stiffness, patella tracking problems, thromboembolic complications and other imponderables were discussed. The patient acknowledged the explanation, agreed to proceed with the plan and consent was signed. Patient is being admitted for inpatient treatment for surgery, pain control, PT, OT, prophylactic antibiotics, VTE prophylaxis, progressive ambulation and ADL's and discharge planning. The patient is planning to be discharged home with home health services   Patient's anticipated LOS is less than 2 midnights, meeting these requirements: - Younger than 56 - Lives within 1 hour of care - Has a competent adult at home to recover with post-op recover - NO history of  - Chronic pain requiring opiods  - Diabetes  - Coronary Artery Disease  - Heart failure  - Heart attack  - Stroke  - DVT/VTE  - Cardiac arrhythmia  - Respiratory Failure/COPD  - Renal failure  - Anemia  - Advanced Liver disease

## 2019-09-08 NOTE — Progress Notes (Signed)
Pt updated about time change.  Aware to be here at 7:30. States understanding.

## 2019-09-09 ENCOUNTER — Ambulatory Visit (HOSPITAL_COMMUNITY): Payer: Medicare Other | Admitting: Physician Assistant

## 2019-09-09 ENCOUNTER — Ambulatory Visit (HOSPITAL_COMMUNITY): Payer: Medicare Other | Admitting: Anesthesiology

## 2019-09-09 ENCOUNTER — Other Ambulatory Visit: Payer: Self-pay

## 2019-09-09 ENCOUNTER — Inpatient Hospital Stay (HOSPITAL_COMMUNITY)
Admission: RE | Admit: 2019-09-09 | Discharge: 2019-09-12 | DRG: 470 | Disposition: A | Discharge status: Home Health | Payer: Medicare Other | Attending: Orthopaedic Surgery | Admitting: Orthopaedic Surgery

## 2019-09-09 ENCOUNTER — Encounter (HOSPITAL_COMMUNITY): Payer: Self-pay | Admitting: Orthopaedic Surgery

## 2019-09-09 ENCOUNTER — Encounter (HOSPITAL_COMMUNITY)
Admission: RE | Disposition: A | Discharge status: Home Health | Payer: Self-pay | Source: Home / Self Care | Attending: Orthopaedic Surgery

## 2019-09-09 DIAGNOSIS — G8918 Other acute postprocedural pain: Secondary | ICD-10-CM | POA: Diagnosis not present

## 2019-09-09 DIAGNOSIS — Z923 Personal history of irradiation: Secondary | ICD-10-CM | POA: Diagnosis not present

## 2019-09-09 DIAGNOSIS — I10 Essential (primary) hypertension: Secondary | ICD-10-CM | POA: Diagnosis not present

## 2019-09-09 DIAGNOSIS — Z7982 Long term (current) use of aspirin: Secondary | ICD-10-CM | POA: Diagnosis not present

## 2019-09-09 DIAGNOSIS — J45909 Unspecified asthma, uncomplicated: Secondary | ICD-10-CM | POA: Diagnosis not present

## 2019-09-09 DIAGNOSIS — Z79899 Other long term (current) drug therapy: Secondary | ICD-10-CM | POA: Diagnosis not present

## 2019-09-09 DIAGNOSIS — M1711 Unilateral primary osteoarthritis, right knee: Principal | ICD-10-CM | POA: Diagnosis present

## 2019-09-09 DIAGNOSIS — Z7951 Long term (current) use of inhaled steroids: Secondary | ICD-10-CM

## 2019-09-09 DIAGNOSIS — Z87891 Personal history of nicotine dependence: Secondary | ICD-10-CM | POA: Diagnosis not present

## 2019-09-09 DIAGNOSIS — I110 Hypertensive heart disease with heart failure: Secondary | ICD-10-CM | POA: Diagnosis not present

## 2019-09-09 DIAGNOSIS — Z8249 Family history of ischemic heart disease and other diseases of the circulatory system: Secondary | ICD-10-CM | POA: Diagnosis not present

## 2019-09-09 DIAGNOSIS — Z853 Personal history of malignant neoplasm of breast: Secondary | ICD-10-CM | POA: Diagnosis not present

## 2019-09-09 DIAGNOSIS — K219 Gastro-esophageal reflux disease without esophagitis: Secondary | ICD-10-CM | POA: Diagnosis present

## 2019-09-09 DIAGNOSIS — M8580 Other specified disorders of bone density and structure, unspecified site: Secondary | ICD-10-CM | POA: Diagnosis present

## 2019-09-09 DIAGNOSIS — M179 Osteoarthritis of knee, unspecified: Secondary | ICD-10-CM | POA: Diagnosis present

## 2019-09-09 DIAGNOSIS — I509 Heart failure, unspecified: Secondary | ICD-10-CM | POA: Diagnosis not present

## 2019-09-09 HISTORY — PX: TOTAL KNEE ARTHROPLASTY: SHX125

## 2019-09-09 LAB — ABO/RH: ABO/RH(D): O POS

## 2019-09-09 LAB — TYPE AND SCREEN
ABO/RH(D): O POS
Antibody Screen: NEGATIVE

## 2019-09-09 SURGERY — ARTHROPLASTY, KNEE, TOTAL
Anesthesia: Regional | Site: Knee | Laterality: Right

## 2019-09-09 MED ORDER — STERILE WATER FOR IRRIGATION IR SOLN
Status: DC | PRN
  Administered 2019-09-09: 2000 mL

## 2019-09-09 MED ORDER — ONDANSETRON HCL 4 MG PO TABS: 4.0000 mg | ORAL_TABLET | Freq: Four times a day (QID) | ORAL | Status: DC | PRN

## 2019-09-09 MED ORDER — ONDANSETRON HCL 4 MG/2ML IJ SOLN: 4.0000 mg | Freq: Four times a day (QID) | INTRAMUSCULAR | Status: DC | PRN

## 2019-09-09 MED ORDER — FLUTICASONE FUROATE-VILANTEROL 200-25 MCG/INH IN AEPB
1.0000 | INHALATION_SPRAY | Freq: Every day | RESPIRATORY_TRACT | Status: DC
  Filled 2019-09-09: qty 28

## 2019-09-09 MED ORDER — OXYCODONE HCL 5 MG/5ML PO SOLN: 5.0000 mg | Freq: Once | ORAL | Status: AC | PRN

## 2019-09-09 MED ORDER — ACETAMINOPHEN 10 MG/ML IV SOLN
INTRAVENOUS | Status: AC
  Filled 2019-09-09: qty 100

## 2019-09-09 MED ORDER — ONDANSETRON HCL 4 MG/2ML IJ SOLN
INTRAMUSCULAR | Status: DC | PRN
  Administered 2019-09-09: 4 mg via INTRAVENOUS

## 2019-09-09 MED ORDER — LIDOCAINE 2% (20 MG/ML) 5 ML SYRINGE
INTRAMUSCULAR | Status: DC | PRN
  Administered 2019-09-09: 100 mg via INTRAVENOUS

## 2019-09-09 MED ORDER — ONDANSETRON HCL 4 MG/2ML IJ SOLN
INTRAMUSCULAR | Status: AC
  Filled 2019-09-09: qty 2

## 2019-09-09 MED ORDER — DEXAMETHASONE SODIUM PHOSPHATE 10 MG/ML IJ SOLN
INTRAMUSCULAR | Status: AC
  Filled 2019-09-09: qty 1

## 2019-09-09 MED ORDER — METHOCARBAMOL 500 MG IVPB - SIMPLE MED
500.0000 mg | Freq: Four times a day (QID) | INTRAVENOUS | Status: DC | PRN
  Filled 2019-09-09: qty 50

## 2019-09-09 MED ORDER — LORATADINE 10 MG PO TABS
10.0000 mg | ORAL_TABLET | Freq: Every day | ORAL | Status: DC
  Administered 2019-09-10 – 2019-09-12 (×3): 10 mg via ORAL
  Filled 2019-09-09 (×3): qty 1

## 2019-09-09 MED ORDER — AMLODIPINE BESYLATE 5 MG PO TABS
5.0000 mg | ORAL_TABLET | Freq: Every day | ORAL | Status: DC
  Administered 2019-09-09 – 2019-09-11 (×3): 5 mg via ORAL
  Filled 2019-09-09 (×3): qty 1

## 2019-09-09 MED ORDER — SUCCINYLCHOLINE CHLORIDE 200 MG/10ML IV SOSY
PREFILLED_SYRINGE | INTRAVENOUS | Status: DC | PRN
  Administered 2019-09-09: 120 mg via INTRAVENOUS

## 2019-09-09 MED ORDER — ACETAMINOPHEN 10 MG/ML IV SOLN: 1000.0000 mg | Freq: Once | INTRAVENOUS | Status: DC | PRN

## 2019-09-09 MED ORDER — METHOCARBAMOL 500 MG PO TABS
500.0000 mg | ORAL_TABLET | Freq: Four times a day (QID) | ORAL | Status: DC | PRN
  Administered 2019-09-10 – 2019-09-12 (×4): 500 mg via ORAL
  Filled 2019-09-09 (×5): qty 1

## 2019-09-09 MED ORDER — LACTATED RINGERS IV SOLN: INTRAVENOUS | Status: DC

## 2019-09-09 MED ORDER — LIDOCAINE 2% (20 MG/ML) 5 ML SYRINGE
INTRAMUSCULAR | Status: AC
  Filled 2019-09-09: qty 5

## 2019-09-09 MED ORDER — BISACODYL 5 MG PO TBEC
5.0000 mg | DELAYED_RELEASE_TABLET | Freq: Every day | ORAL | Status: DC | PRN
  Administered 2019-09-11: 5 mg via ORAL
  Filled 2019-09-09: qty 1

## 2019-09-09 MED ORDER — BUPIVACAINE-EPINEPHRINE (PF) 0.25% -1:200000 IJ SOLN
INTRAMUSCULAR | Status: AC
  Filled 2019-09-09: qty 30

## 2019-09-09 MED ORDER — ACETAMINOPHEN 160 MG/5ML PO SOLN: 1000.0000 mg | Freq: Once | ORAL | Status: DC | PRN

## 2019-09-09 MED ORDER — MENTHOL 3 MG MT LOZG
1.0000 | LOZENGE | OROMUCOSAL | Status: DC | PRN
  Administered 2019-09-09: 3 mg via ORAL
  Filled 2019-09-09: qty 9

## 2019-09-09 MED ORDER — TRANEXAMIC ACID-NACL 1000-0.7 MG/100ML-% IV SOLN
1000.0000 mg | Freq: Once | INTRAVENOUS | Status: AC
  Administered 2019-09-09: 1000 mg via INTRAVENOUS
  Filled 2019-09-09: qty 100

## 2019-09-09 MED ORDER — ALUM & MAG HYDROXIDE-SIMETH 200-200-20 MG/5ML PO SUSP: 30.0000 mL | ORAL | Status: DC | PRN

## 2019-09-09 MED ORDER — SUCCINYLCHOLINE CHLORIDE 200 MG/10ML IV SOSY
PREFILLED_SYRINGE | INTRAVENOUS | Status: AC
  Filled 2019-09-09: qty 10

## 2019-09-09 MED ORDER — TRANEXAMIC ACID 1000 MG/10ML IV SOLN
INTRAVENOUS | Status: DC | PRN
  Administered 2019-09-09: 2000 mg via TOPICAL

## 2019-09-09 MED ORDER — BUPIVACAINE-EPINEPHRINE (PF) 0.25% -1:200000 IJ SOLN
INTRAMUSCULAR | Status: DC | PRN
  Administered 2019-09-09: 30 mL

## 2019-09-09 MED ORDER — ALBUTEROL SULFATE (5 MG/ML) 0.5% IN NEBU
2.5000 mg | INHALATION_SOLUTION | Freq: Four times a day (QID) | RESPIRATORY_TRACT | Status: DC | PRN
  Filled 2019-09-09: qty 0.5

## 2019-09-09 MED ORDER — OXYCODONE HCL 5 MG PO TABS
5.0000 mg | ORAL_TABLET | Freq: Once | ORAL | Status: AC | PRN
  Administered 2019-09-09: 5 mg via ORAL

## 2019-09-09 MED ORDER — KETOROLAC TROMETHAMINE 15 MG/ML IJ SOLN
7.5000 mg | Freq: Four times a day (QID) | INTRAMUSCULAR | Status: AC
  Administered 2019-09-09 – 2019-09-10 (×4): 7.5 mg via INTRAVENOUS
  Filled 2019-09-09 (×4): qty 1

## 2019-09-09 MED ORDER — OXYCODONE HCL 5 MG PO TABS
ORAL_TABLET | ORAL | Status: AC
  Filled 2019-09-09: qty 1

## 2019-09-09 MED ORDER — FLUTICASONE PROPIONATE 50 MCG/ACT NA SUSP
1.0000 | Freq: Every day | NASAL | Status: DC
  Filled 2019-09-09: qty 16

## 2019-09-09 MED ORDER — SODIUM CHLORIDE (PF) 0.9 % IJ SOLN
INTRAMUSCULAR | Status: DC | PRN
  Administered 2019-09-09: 30 mL

## 2019-09-09 MED ORDER — METOCLOPRAMIDE HCL 5 MG PO TABS: 5.0000 mg | ORAL_TABLET | Freq: Three times a day (TID) | ORAL | Status: DC | PRN

## 2019-09-09 MED ORDER — METHOCARBAMOL 500 MG IVPB - SIMPLE MED
INTRAVENOUS | Status: AC
  Filled 2019-09-09: qty 50

## 2019-09-09 MED ORDER — HYDROCODONE-ACETAMINOPHEN 5-325 MG PO TABS
1.0000 | ORAL_TABLET | ORAL | Status: DC | PRN
  Administered 2019-09-09: 2 via ORAL
  Administered 2019-09-10: 1 via ORAL
  Filled 2019-09-09: qty 1
  Filled 2019-09-09: qty 2

## 2019-09-09 MED ORDER — FENTANYL CITRATE (PF) 100 MCG/2ML IJ SOLN
INTRAMUSCULAR | Status: AC
  Filled 2019-09-09: qty 2

## 2019-09-09 MED ORDER — BUPIVACAINE LIPOSOME 1.3 % IJ SUSP
INTRAMUSCULAR | Status: DC | PRN
  Administered 2019-09-09: 20 mL

## 2019-09-09 MED ORDER — CEFAZOLIN SODIUM-DEXTROSE 2-4 GM/100ML-% IV SOLN
2.0000 g | INTRAVENOUS | Status: AC
  Administered 2019-09-09: 2 g via INTRAVENOUS
  Filled 2019-09-09: qty 100

## 2019-09-09 MED ORDER — EPHEDRINE SULFATE-NACL 50-0.9 MG/10ML-% IV SOSY
PREFILLED_SYRINGE | INTRAVENOUS | Status: DC | PRN
  Administered 2019-09-09: 10 mg via INTRAVENOUS

## 2019-09-09 MED ORDER — DEXAMETHASONE SODIUM PHOSPHATE 10 MG/ML IJ SOLN
INTRAMUSCULAR | Status: DC | PRN
  Administered 2019-09-09: 10 mg via INTRAVENOUS

## 2019-09-09 MED ORDER — MIDAZOLAM HCL 2 MG/2ML IJ SOLN
1.0000 mg | INTRAMUSCULAR | Status: DC
  Administered 2019-09-09 (×2): 1 mg via INTRAVENOUS
  Filled 2019-09-09: qty 2

## 2019-09-09 MED ORDER — 0.9 % SODIUM CHLORIDE (POUR BTL) OPTIME
TOPICAL | Status: DC | PRN
  Administered 2019-09-09: 1000 mL

## 2019-09-09 MED ORDER — CHLORHEXIDINE GLUCONATE CLOTH 2 % EX PADS
6.0000 | MEDICATED_PAD | Freq: Every day | CUTANEOUS | Status: DC
  Administered 2019-09-09: 6 via TOPICAL

## 2019-09-09 MED ORDER — MUPIROCIN 2 % EX OINT
1.0000 "application " | TOPICAL_OINTMENT | Freq: Two times a day (BID) | CUTANEOUS | Status: DC
  Administered 2019-09-09 – 2019-09-12 (×7): 1 via NASAL
  Filled 2019-09-09: qty 22

## 2019-09-09 MED ORDER — PROPOFOL 10 MG/ML IV BOLUS
INTRAVENOUS | Status: DC | PRN
  Administered 2019-09-09: 150 ug via INTRAVENOUS
  Administered 2019-09-09: 100 ug via INTRAVENOUS
  Administered 2019-09-09: 50 ug via INTRAVENOUS

## 2019-09-09 MED ORDER — DIPHENHYDRAMINE HCL 12.5 MG/5ML PO ELIX
12.5000 mg | ORAL_SOLUTION | ORAL | Status: DC | PRN
  Administered 2019-09-11: 25 mg via ORAL
  Filled 2019-09-09: qty 10

## 2019-09-09 MED ORDER — PHENOL 1.4 % MT LIQD: 1.0000 | OROMUCOSAL | Status: DC | PRN

## 2019-09-09 MED ORDER — PANTOPRAZOLE SODIUM 40 MG PO TBEC
80.0000 mg | DELAYED_RELEASE_TABLET | Freq: Every day | ORAL | Status: DC
  Administered 2019-09-10 – 2019-09-12 (×3): 80 mg via ORAL
  Filled 2019-09-09 (×3): qty 2

## 2019-09-09 MED ORDER — DOCUSATE SODIUM 100 MG PO CAPS
100.0000 mg | ORAL_CAPSULE | Freq: Two times a day (BID) | ORAL | Status: DC
  Administered 2019-09-09 – 2019-09-12 (×6): 100 mg via ORAL
  Filled 2019-09-09 (×6): qty 1

## 2019-09-09 MED ORDER — ROSUVASTATIN CALCIUM 5 MG PO TABS
5.0000 mg | ORAL_TABLET | Freq: Every day | ORAL | Status: DC
  Administered 2019-09-10 – 2019-09-12 (×3): 5 mg via ORAL
  Filled 2019-09-09 (×4): qty 1

## 2019-09-09 MED ORDER — FENTANYL CITRATE (PF) 100 MCG/2ML IJ SOLN
INTRAMUSCULAR | Status: DC | PRN
  Administered 2019-09-09 (×2): 50 ug via INTRAVENOUS

## 2019-09-09 MED ORDER — ORAL CARE MOUTH RINSE: 15.0000 mL | Freq: Once | OROMUCOSAL | Status: AC

## 2019-09-09 MED ORDER — ACETAMINOPHEN 325 MG PO TABS: 325.0000 mg | ORAL_TABLET | Freq: Four times a day (QID) | ORAL | Status: DC | PRN

## 2019-09-09 MED ORDER — VANCOMYCIN HCL IN DEXTROSE 1-5 GM/200ML-% IV SOLN
1000.0000 mg | Freq: Two times a day (BID) | INTRAVENOUS | Status: AC
  Administered 2019-09-09: 1000 mg via INTRAVENOUS
  Filled 2019-09-09: qty 200

## 2019-09-09 MED ORDER — PHENYLEPHRINE 40 MCG/ML (10ML) SYRINGE FOR IV PUSH (FOR BLOOD PRESSURE SUPPORT)
PREFILLED_SYRINGE | INTRAVENOUS | Status: DC | PRN
  Administered 2019-09-09 (×3): 80 ug via INTRAVENOUS
  Administered 2019-09-09: 120 ug via INTRAVENOUS

## 2019-09-09 MED ORDER — ALBUTEROL SULFATE (2.5 MG/3ML) 0.083% IN NEBU: 2.5000 mg | INHALATION_SOLUTION | Freq: Four times a day (QID) | RESPIRATORY_TRACT | Status: DC | PRN

## 2019-09-09 MED ORDER — HYDROCODONE-ACETAMINOPHEN 7.5-325 MG PO TABS
1.0000 | ORAL_TABLET | ORAL | Status: DC | PRN
  Administered 2019-09-11: 1 via ORAL
  Administered 2019-09-11: 2 via ORAL
  Administered 2019-09-11: 1 via ORAL
  Administered 2019-09-11 – 2019-09-12 (×5): 2 via ORAL
  Filled 2019-09-09: qty 2
  Filled 2019-09-09: qty 1
  Filled 2019-09-09 (×5): qty 2
  Filled 2019-09-09: qty 1

## 2019-09-09 MED ORDER — SODIUM CHLORIDE (PF) 0.9 % IJ SOLN
INTRAMUSCULAR | Status: AC
  Filled 2019-09-09: qty 50

## 2019-09-09 MED ORDER — FENTANYL CITRATE (PF) 100 MCG/2ML IJ SOLN
50.0000 ug | INTRAMUSCULAR | Status: DC
  Administered 2019-09-09 (×2): 50 ug via INTRAVENOUS
  Filled 2019-09-09: qty 2

## 2019-09-09 MED ORDER — ACETAMINOPHEN 500 MG PO TABS: 1000.0000 mg | ORAL_TABLET | Freq: Once | ORAL | Status: DC | PRN

## 2019-09-09 MED ORDER — SODIUM CHLORIDE 0.9 % IR SOLN
Status: DC | PRN
  Administered 2019-09-09: 3000 mL

## 2019-09-09 MED ORDER — LOSARTAN POTASSIUM 50 MG PO TABS
100.0000 mg | ORAL_TABLET | Freq: Every day | ORAL | Status: DC
  Administered 2019-09-09 – 2019-09-12 (×4): 100 mg via ORAL
  Filled 2019-09-09 (×4): qty 2

## 2019-09-09 MED ORDER — KETOROLAC TROMETHAMINE 30 MG/ML IJ SOLN
INTRAMUSCULAR | Status: DC | PRN
  Administered 2019-09-09: 30 mg via INTRAVENOUS

## 2019-09-09 MED ORDER — ASPIRIN 81 MG PO CHEW
81.0000 mg | CHEWABLE_TABLET | Freq: Two times a day (BID) | ORAL | Status: DC
  Administered 2019-09-10 – 2019-09-12 (×5): 81 mg via ORAL
  Filled 2019-09-09 (×5): qty 1

## 2019-09-09 MED ORDER — NEBIVOLOL HCL 5 MG PO TABS
5.0000 mg | ORAL_TABLET | Freq: Every day | ORAL | Status: DC
  Administered 2019-09-10 – 2019-09-12 (×3): 5 mg via ORAL
  Filled 2019-09-09 (×3): qty 1

## 2019-09-09 MED ORDER — MORPHINE SULFATE (PF) 2 MG/ML IV SOLN: 0.5000 mg | INTRAVENOUS | Status: DC | PRN

## 2019-09-09 MED ORDER — ACETAMINOPHEN 10 MG/ML IV SOLN
INTRAVENOUS | Status: DC | PRN
Start: 2019-09-09 — End: 2019-09-09
  Administered 2019-09-09: 1000 mg via INTRAVENOUS

## 2019-09-09 MED ORDER — POTASSIUM CHLORIDE CRYS ER 20 MEQ PO TBCR
60.0000 meq | EXTENDED_RELEASE_TABLET | Freq: Every day | ORAL | Status: DC
  Administered 2019-09-09: 60 meq via ORAL
  Administered 2019-09-10: 20 meq via ORAL
  Filled 2019-09-09 (×2): qty 3

## 2019-09-09 MED ORDER — FENTANYL CITRATE (PF) 100 MCG/2ML IJ SOLN
25.0000 ug | INTRAMUSCULAR | Status: DC | PRN
  Administered 2019-09-09 (×3): 50 ug via INTRAVENOUS

## 2019-09-09 MED ORDER — TRANEXAMIC ACID-NACL 1000-0.7 MG/100ML-% IV SOLN
1000.0000 mg | INTRAVENOUS | Status: AC
  Administered 2019-09-09: 1000 mg via INTRAVENOUS
  Filled 2019-09-09: qty 100

## 2019-09-09 MED ORDER — ACETAMINOPHEN 500 MG PO TABS
500.0000 mg | ORAL_TABLET | Freq: Four times a day (QID) | ORAL | Status: AC
  Administered 2019-09-09 – 2019-09-10 (×4): 500 mg via ORAL
  Filled 2019-09-09 (×4): qty 1

## 2019-09-09 MED ORDER — METOCLOPRAMIDE HCL 5 MG/ML IJ SOLN: 5.0000 mg | Freq: Three times a day (TID) | INTRAMUSCULAR | Status: DC | PRN

## 2019-09-09 MED ORDER — CHLORHEXIDINE GLUCONATE 0.12 % MT SOLN
15.0000 mL | Freq: Once | OROMUCOSAL | Status: AC
  Administered 2019-09-09: 15 mL via OROMUCOSAL

## 2019-09-09 MED ORDER — POVIDONE-IODINE 10 % EX SWAB
2.0000 "application " | Freq: Once | CUTANEOUS | Status: AC
  Administered 2019-09-09: 2 via TOPICAL

## 2019-09-09 SURGICAL SUPPLY — 56 items
ATTUNE MED DOME PAT 38 KNEE (Knees) ×1 IMPLANT
ATTUNE MED DOME PAT 38MM KNEE (Knees) ×1 IMPLANT
ATTUNE PS FEM RT SZ 5 CEM KNEE (Femur) ×2 IMPLANT
ATTUNE PSRP INSE SZ 5 7MM KNEE (Insert) ×1 IMPLANT
ATTUNE PSRP INSE SZ5 7 KNEE (Insert) ×1 IMPLANT
BAG DECANTER FOR FLEXI CONT (MISCELLANEOUS) ×3 IMPLANT
BAG SPEC THK2 15X12 ZIP CLS (MISCELLANEOUS) ×1
BAG ZIPLOCK 12X15 (MISCELLANEOUS) ×3 IMPLANT
BASE TIBIA ATTUNE KNEE SYS SZ6 (Knees) IMPLANT
BLADE SAGITTAL 25.0X1.19X90 (BLADE) ×2 IMPLANT
BLADE SAGITTAL 25.0X1.19X90MM (BLADE) ×1
BLADE SAW SGTL 11.0X1.19X90.0M (BLADE) ×3 IMPLANT
BNDG CMPR MED 10X6 ELC LF (GAUZE/BANDAGES/DRESSINGS) ×1
BNDG ELASTIC 6X10 VLCR STRL LF (GAUZE/BANDAGES/DRESSINGS) ×2 IMPLANT
BNDG ELASTIC 6X5.8 VLCR STR LF (GAUZE/BANDAGES/DRESSINGS) ×3 IMPLANT
BOOTIES KNEE HIGH SLOAN (MISCELLANEOUS) ×3 IMPLANT
BOWL SMART MIX CTS (DISPOSABLE) ×3 IMPLANT
BSPLAT TIB 6 CMNT ROT PLAT STR (Knees) ×1 IMPLANT
CEMENT HV SMART SET (Cement) ×6 IMPLANT
COVER SURGICAL LIGHT HANDLE (MISCELLANEOUS) ×3 IMPLANT
COVER WAND RF STERILE (DRAPES) ×3 IMPLANT
CUFF TOURN SGL QUICK 34 (TOURNIQUET CUFF) ×3
CUFF TRNQT CYL 34X4.125X (TOURNIQUET CUFF) ×1 IMPLANT
DECANTER SPIKE VIAL GLASS SM (MISCELLANEOUS) ×6 IMPLANT
DRAPE SHEET LG 3/4 BI-LAMINATE (DRAPES) ×3 IMPLANT
DRAPE TOP 10253 STERILE (DRAPES) ×3 IMPLANT
DRAPE U-SHAPE 47X51 STRL (DRAPES) ×3 IMPLANT
DRSG AQUACEL AG ADV 3.5X10 (GAUZE/BANDAGES/DRESSINGS) ×3 IMPLANT
DURAPREP 26ML APPLICATOR (WOUND CARE) ×6 IMPLANT
ELECT REM PT RETURN 15FT ADLT (MISCELLANEOUS) ×3 IMPLANT
GLOVE BIO SURGEON STRL SZ8 (GLOVE) ×6 IMPLANT
GLOVE BIOGEL PI IND STRL 8 (GLOVE) ×2 IMPLANT
GLOVE BIOGEL PI INDICATOR 8 (GLOVE) ×4
GOWN STRL REUS W/TWL XL LVL3 (GOWN DISPOSABLE) ×6 IMPLANT
HANDPIECE INTERPULSE COAX TIP (DISPOSABLE) ×3
HOLDER FOLEY CATH W/STRAP (MISCELLANEOUS) IMPLANT
HOOD PEEL AWAY FLYTE STAYCOOL (MISCELLANEOUS) ×9 IMPLANT
KIT TURNOVER KIT A (KITS) IMPLANT
MANIFOLD NEPTUNE II (INSTRUMENTS) ×3 IMPLANT
NS IRRIG 1000ML POUR BTL (IV SOLUTION) ×3 IMPLANT
PACK TOTAL KNEE CUSTOM (KITS) ×3 IMPLANT
PAD ARMBOARD 7.5X6 YLW CONV (MISCELLANEOUS) ×3 IMPLANT
PENCIL SMOKE EVACUATOR (MISCELLANEOUS) IMPLANT
PIN DRILL FIX HALF THREAD (BIT) ×2 IMPLANT
PIN STEINMAN FIXATION KNEE (PIN) ×2 IMPLANT
PROTECTOR NERVE ULNAR (MISCELLANEOUS) ×3 IMPLANT
SET HNDPC FAN SPRY TIP SCT (DISPOSABLE) ×1 IMPLANT
SUT ETHIBOND NAB CT1 #1 30IN (SUTURE) ×8 IMPLANT
SUT VIC AB 0 CT1 36 (SUTURE) ×3 IMPLANT
SUT VIC AB 2-0 CT1 27 (SUTURE) ×6
SUT VIC AB 2-0 CT1 TAPERPNT 27 (SUTURE) ×1 IMPLANT
SUT VICRYL AB 3-0 FS1 BRD 27IN (SUTURE) ×3 IMPLANT
TIBIA ATTUNE KNEE SYS BASE SZ6 (Knees) ×3 IMPLANT
TRAY FOLEY MTR SLVR 16FR STAT (SET/KITS/TRAYS/PACK) IMPLANT
WATER STERILE IRR 1000ML POUR (IV SOLUTION) ×3 IMPLANT
WRAP KNEE MAXI GEL POST OP (GAUZE/BANDAGES/DRESSINGS) ×3 IMPLANT

## 2019-09-09 NOTE — Op Note (Signed)
PREOP DIAGNOSIS: DJD RIGHT KNEE POSTOP DIAGNOSIS: same PROCEDURE: RIGHT TKR ANESTHESIA: General and block ATTENDING SURGEON: Hessie Dibble ASSISTANT: Loni Dolly PA  INDICATIONS FOR PROCEDURE: Bridget Cortez is a 74 y.o. female who has struggled for a long time with pain due to degenerative arthritis of the right knee.  The patient has failed many conservative non-operative measures and at this point has pain which limits the ability to sleep and walk.  The patient is offered total knee replacement.  Informed operative consent was obtained after discussion of possible risks of anesthesia, infection, neurovascular injury, DVT, and death.  The importance of the post-operative rehabilitation protocol to optimize result was stressed extensively with the patient.  SUMMARY OF FINDINGS AND PROCEDURE:  Bridget Cortez was taken to the operative suite where under the above anesthesia a right knee replacement was performed.  The general was required due to extreme anxiety of the patient. There were advanced degenerative changes and the bone quality was fair.  We used the DePuy Attune system and placed size 5 femur, 6 tibia, 38 mm all polyethylene patella, and a size 7 mm spacer.  Loni Dolly [name] assisted throughout and was invaluable to the completion of the case in that he helped retract and maintain exposure while I placed components.  He also helped close thereby minimizing OR time.  The patient was admitted for appropriate post-op care to include perioperative antibiotics and mechanical and pharmacologic measures for DVT prophylaxis.  DESCRIPTION OF PROCEDURE:  Bridget Cortez was taken to the operative suite where the above anesthesia was applied.  The patient was positioned supine and prepped and draped in normal sterile fashion.  An appropriate time out was performed.  After the administration of kefzol and vancomycin pre-op antibiotic the leg was elevated and exsanguinated and  a tourniquet inflated. A standard longitudinal incision was made on the anterior knee.  Dissection was carried down to the extensor mechanism.  All appropriate anti-infective measures were used including the pre-operative antibiotic, betadine impregnated drape, and closed hooded exhaust systems for each member of the surgical team.  A medial parapatellar incision was made in the extensor mechanism and the knee cap flipped and the knee flexed.  Some residual meniscal tissues were removed along with any remaining ACL/PCL tissue.  A guide was placed on the tibia and a flat cut was made on it's superior surface.  An intramedullary guide was placed in the femur and was utilized to make anterior and posterior cuts creating an appropriate flexion gap.  A second intramedullary guide was placed in the femur to make a distal cut properly balancing the knee with an extension gap equal to the flexion gap.  She had a cyst in the lateral aspect of the distal femur which measured about 1cm in diameter.  We removed the wall of the cyst. The three bones sized to the above mentioned sizes and the appropriate guides were placed and utilized.  A trial reduction was done and the knee easily came to full extension and the patella tracked well on flexion.  The trial components were removed and all bones were cleaned with pulsatile lavage and then dried thoroughly.  Cement was mixed and was pressurized onto the bones including filling of the aforementioned cyst followed by placement of the aforementioned components.  Excess cement was trimmed and pressure was held on the components until the cement had hardened.  The tourniquet was deflated and a small amount of bleeding was controlled with cautery and  pressure.  The knee was irrigated thoroughly.  The extensor mechanism was re-approximated with #1 ethibond in interrupted fashion.  The knee was flexed and the repair was solid.  The subcutaneous tissues were re-approximated with #0 and #2-0  vicryl and the skin closed with a subcuticular stitch and steristrips.  A sterile dressing was applied.  Intraoperative fluids, EBL, and tourniquet time can be obtained from anesthesia records.  DISPOSITION:  The patient was taken to recovery room in stable condition and admitted for appropriate post-op care to include peri-operative antibiotic and DVT prophylaxis with mechanical and pharmacologic measures.  Hessie Dibble 09/09/2019, 12:14 PM

## 2019-09-09 NOTE — Anesthesia Procedure Notes (Signed)
Procedure Name: Intubation Date/Time: 09/09/2019 11:04 AM Performed by: Lieutenant Diego, CRNA Pre-anesthesia Checklist: Patient identified, Emergency Drugs available, Suction available and Patient being monitored Patient Re-evaluated:Patient Re-evaluated prior to induction Oxygen Delivery Method: Circle system utilized Preoxygenation: Pre-oxygenation with 100% oxygen Induction Type: IV induction Ventilation: Mask ventilation without difficulty Laryngoscope Size: Miller and 2 Grade View: Grade I Tube type: Oral Tube size: 7.0 mm Number of attempts: 1 Airway Equipment and Method: Stylet Placement Confirmation: ETT inserted through vocal cords under direct vision,  positive ETCO2 and breath sounds checked- equal and bilateral Secured at: 22 cm Tube secured with: Tape Dental Injury: Teeth and Oropharynx as per pre-operative assessment

## 2019-09-09 NOTE — Evaluation (Signed)
Physical Therapy Evaluation Patient Details Name: Bridget Cortez MRN: [id number] DOB: Oct 22, 1945 Today's Date: 09/09/2019   History of Present Illness  Pt is a 74 year old female s/p right TKA.  PMH of asthma, chiari malformation, HTN, breast CA, PNA  Clinical Impression  Pt is s/p TKA resulting in the deficits listed below (see PT Problem List).  Pt will benefit from skilled PT to increase their independence and safety with mobility to allow discharge to the venue listed below.  Pt requiring min assist for transfers with increased cues and time required.  Pt assisted to [facility] and then recliner POD #0.  Pt plans to d/c home with spouse.  Pt will need to be able to practice steps prior to d/c.      Follow Up Recommendations Home health PT;Follow surgeon's recommendation for DC plan and follow-up therapies    Equipment Recommendations  3in1 (PT)    Recommendations for Other Services       Precautions / Restrictions Precautions Precautions: Fall;Knee Restrictions Weight Bearing Restrictions: No      Mobility  Bed Mobility Overal bed mobility: Needs Assistance Bed Mobility: Supine to Sit     Supine to sit: Min assist     General bed mobility comments: verbal cues for technique, assist for R LE  Transfers Overall transfer level: Needs assistance Equipment used: Rolling walker (2 wheeled) Transfers: Sit to/from [facility] Sit to Stand: Min assist;+2 safety/equipment Stand pivot transfers: Min assist;+2 safety/equipment       General transfer comment: pt requesting assist to [facility], max multimodal cues for positioning and weight shifting, pt reports "bad" L LE as well, assist to rise, steady and control descent, pt with difficulty pivoting requiring step by step cues and increased time; bed to [facility] to recliner  Ambulation/Gait                Stairs            Wheelchair Mobility    Modified Rankin (Stroke Patients Only)        Balance                                             Pertinent Vitals/Pain Pain Assessment: Faces Faces Pain Scale: Hurts even more Pain Location: right knee Pain Descriptors / Indicators: Aching;Grimacing;Sore Pain Intervention(s): Repositioned;Monitored during session    Home Living Family/patient expects to be discharged to:: Private residence Living Arrangements: Spouse/significant other   Type of Home: House Home Access: Stairs to enter Entrance Stairs-Rails: Right Entrance Stairs-Number of Steps: 3 Home Layout: One level Home Equipment: Environmental consultant - 2 wheels      Prior Function Level of Independence: Independent with assistive device(s)               Hand Dominance        Extremity/Trunk Assessment        Lower Extremity Assessment Lower Extremity Assessment: Generalized weakness;RLE deficits/detail RLE Deficits / Details: unable to perform SLR, approx 50* AAROM right knee flexion       Communication   Communication: No difficulties  Cognition Arousal/Alertness: Awake/alert Behavior During Therapy: WFL for tasks assessed/performed Overall Cognitive Status: Within Functional Limits for tasks assessed  General Comments      Exercises     Assessment/Plan    PT Assessment Patient needs continued PT services  PT Problem List Decreased strength;Decreased range of motion;Decreased knowledge of use of DME;Pain;Obesity;Decreased mobility;Decreased activity tolerance       PT Treatment Interventions Gait training;Stair training;DME instruction;Therapeutic exercise;Balance training;Therapeutic activities;Patient/family education;Functional mobility training    PT Goals (Current goals can be found in the Care Plan section)  Acute Rehab PT Goals PT Goal Formulation: With patient Time For Goal Achievement: 09/16/19 Potential to Achieve Goals: Good    Frequency 7X/week    Barriers to discharge        Co-evaluation               AM-PAC PT "6 Clicks" Mobility  Outcome Measure Help needed turning from your back to your side while in a flat bed without using bedrails?: A Little Help needed moving from lying on your back to sitting on the side of a flat bed without using bedrails?: A Little Help needed moving to and from a bed to a chair (including a wheelchair)?: A Little Help needed standing up from a chair using your arms (e.g., wheelchair or bedside chair)?: A Little Help needed to walk in hospital room?: A Lot Help needed climbing 3-5 steps with a railing? : A Lot 6 Click Score: 16    End of Session Equipment Utilized During Treatment: Gait belt Activity Tolerance: Patient tolerated treatment well Patient left: in chair;with call bell/phone within reach;with chair alarm set;with family/visitor present Nurse Communication: Mobility status PT Visit Diagnosis: Other abnormalities of gait and mobility (R26.89)    Time: 9381-8299 PT Time Calculation (min) (ACUTE ONLY): 20 min   Charges:   PT Evaluation $PT Eval Low Complexity: 1 Low        Kati PT, DPT Acute Rehabilitation Services Pager: [phone] Office: [phone]  Trena Platt 09/09/2019, 4:38 PM

## 2019-09-09 NOTE — Anesthesia Procedure Notes (Signed)
Procedure Name: LMA Insertion Date/Time: 09/09/2019 10:32 AM Performed by: Lieutenant Diego, CRNA Pre-anesthesia Checklist: Patient identified, Emergency Drugs available, Suction available and Patient being monitored Patient Re-evaluated:Patient Re-evaluated prior to induction Oxygen Delivery Method: Circle system utilized Preoxygenation: Pre-oxygenation with 100% oxygen Induction Type: IV induction Ventilation: Mask ventilation without difficulty LMA: LMA inserted LMA Size: 4.0 Number of attempts: 1 Placement Confirmation: positive ETCO2 and breath sounds checked- equal and bilateral Tube secured with: Tape Dental Injury: Teeth and Oropharynx as per pre-operative assessment

## 2019-09-09 NOTE — Interval H&P Note (Signed)
History and Physical Interval Note:  09/09/2019 9:10 AM  Bridget Cortez  has presented today for surgery, with the diagnosis of RIGHT KNEE DEGENERATIVE JOINT DISEASE.  The various methods of treatment have been discussed with the patient and family. After consideration of risks, benefits and other options for treatment, the patient has consented to  Procedure(s): RIGHT TOTAL KNEE ARTHROPLASTY (Right) as a surgical intervention.  The patient's history has been reviewed, patient examined, no change in status, stable for surgery.  I have reviewed the patient's chart and labs.  Questions were answered to the patient's satisfaction.     Hessie Dibble

## 2019-09-09 NOTE — Transfer of Care (Signed)
Immediate Anesthesia Transfer of Care Note  Patient: Bridget Cortez  Procedure(s) Performed: RIGHT TOTAL KNEE ARTHROPLASTY (Right Knee)  Patient Location: PACU  Anesthesia Type:General  Level of Consciousness: awake and alert   Airway & Oxygen Therapy: Patient Spontanous Breathing and Patient connected to face mask oxygen  Post-op Assessment: Report given to RN and Post -op Vital signs reviewed and stable  Post vital signs: Reviewed and stable  Last Vitals:  Vitals Value Taken Time  BP 122/83 09/09/19 1245  Temp    Pulse 84 09/09/19 1245  Resp    SpO2 99 % 09/09/19 1245  Vitals shown include unvalidated device data.  Last Pain:  Vitals:   09/09/19 0837  TempSrc:   PainSc: 0-No pain         Complications: No complications documented.

## 2019-09-10 DIAGNOSIS — Z79899 Other long term (current) drug therapy: Secondary | ICD-10-CM | POA: Diagnosis not present

## 2019-09-10 DIAGNOSIS — I10 Essential (primary) hypertension: Secondary | ICD-10-CM | POA: Diagnosis not present

## 2019-09-10 DIAGNOSIS — K219 Gastro-esophageal reflux disease without esophagitis: Secondary | ICD-10-CM | POA: Diagnosis not present

## 2019-09-10 DIAGNOSIS — Z923 Personal history of irradiation: Secondary | ICD-10-CM | POA: Diagnosis not present

## 2019-09-10 DIAGNOSIS — M8580 Other specified disorders of bone density and structure, unspecified site: Secondary | ICD-10-CM | POA: Diagnosis not present

## 2019-09-10 DIAGNOSIS — M1711 Unilateral primary osteoarthritis, right knee: Secondary | ICD-10-CM | POA: Diagnosis not present

## 2019-09-10 DIAGNOSIS — Z8249 Family history of ischemic heart disease and other diseases of the circulatory system: Secondary | ICD-10-CM | POA: Diagnosis not present

## 2019-09-10 DIAGNOSIS — J45909 Unspecified asthma, uncomplicated: Secondary | ICD-10-CM | POA: Diagnosis not present

## 2019-09-10 DIAGNOSIS — Z853 Personal history of malignant neoplasm of breast: Secondary | ICD-10-CM | POA: Diagnosis not present

## 2019-09-10 DIAGNOSIS — Z7951 Long term (current) use of inhaled steroids: Secondary | ICD-10-CM | POA: Diagnosis not present

## 2019-09-10 DIAGNOSIS — Z7982 Long term (current) use of aspirin: Secondary | ICD-10-CM | POA: Diagnosis not present

## 2019-09-10 DIAGNOSIS — Z87891 Personal history of nicotine dependence: Secondary | ICD-10-CM | POA: Diagnosis not present

## 2019-09-10 MED ORDER — POTASSIUM CHLORIDE CRYS ER 20 MEQ PO TBCR
20.0000 meq | EXTENDED_RELEASE_TABLET | Freq: Three times a day (TID) | ORAL | Status: DC
  Administered 2019-09-10 – 2019-09-12 (×6): 20 meq via ORAL
  Filled 2019-09-10 (×4): qty 1

## 2019-09-10 NOTE — Progress Notes (Signed)
Physical Therapy Treatment Patient Details Name: Bridget Cortez MRN: [id number] DOB: 1945/10/11 Today's Date: 09/10/2019    History of Present Illness Pt is a 74 year old female s/p right TKA.  PMH of asthma, chiari malformation, HTN, breast CA, PNA    PT Comments    Pt performed a couple exercises in recliner and then ambulated short distance in hallway.  Pt only able to perform short distances at this time due to fatigue.  Pt assisted back to bed.   Follow Up Recommendations  Home health PT;Follow surgeon's recommendation for DC plan and follow-up therapies     Equipment Recommendations  3in1 (PT)    Recommendations for Other Services       Precautions / Restrictions Precautions Precautions: Fall;Knee Restrictions Weight Bearing Restrictions: No    Mobility  Bed Mobility Overal bed mobility: Needs Assistance Bed Mobility: Sit to Supine       Sit to supine: Mod assist   General bed mobility comments: required increased time as pt attempting to perform on her own, pt eventually requested assist for LEs onto bed  Transfers Overall transfer level: Needs assistance Equipment used: Rolling walker (2 wheeled) Transfers: Sit to/from Stand Sit to Stand: Min assist         General transfer comment: verbal cues for positioning and weight shifting, assist to rise and steady  Ambulation/Gait Ambulation/Gait assistance: Min guard Gait Distance (Feet): 28 Feet Assistive device: Rolling walker (2 wheeled) Gait Pattern/deviations: Step-to pattern;Decreased stance time - right;Antalgic Gait velocity: decr   General Gait Details: verbal cues for sequence, RW positioning, posture; recliner following for safety; pt required one seated rest break due to UE fatigue; 28'x2   Stairs             Wheelchair Mobility    Modified Rankin (Stroke Patients Only)       Balance                                            Cognition  Arousal/Alertness: Awake/alert Behavior During Therapy: WFL for tasks assessed/performed Overall Cognitive Status: Within Functional Limits for tasks assessed                                        Exercises General Exercises - Lower Extremity Ankle Circles/Pumps: AROM;Both;10 reps Quad Sets: AROM;Right;10 reps Heel Slides: AROM;Seated;Right;10 reps    General Comments        Pertinent Vitals/Pain Pain Assessment: Faces Faces Pain Scale: Hurts a little bit Pain Location: right knee Pain Descriptors / Indicators: Aching;Grimacing;Sore Pain Intervention(s): Repositioned;Monitored during session    Home Living                      Prior Function            PT Goals (current goals can now be found in the care plan section) Progress towards PT goals: Progressing toward goals    Frequency    7X/week      PT Plan Current plan remains appropriate    Co-evaluation              AM-PAC PT "6 Clicks" Mobility   Outcome Measure  Help needed turning from your back to your side while in a flat bed without using bedrails?:  A Little Help needed moving from lying on your back to sitting on the side of a flat bed without using bedrails?: A Little Help needed moving to and from a bed to a chair (including a wheelchair)?: A Little Help needed standing up from a chair using your arms (e.g., wheelchair or bedside chair)?: A Little Help needed to walk in hospital room?: A Little Help needed climbing 3-5 steps with a railing? : A Lot 6 Click Score: 17    End of Session Equipment Utilized During Treatment: Gait belt Activity Tolerance: Patient limited by fatigue Patient left: with call bell/phone within reach;in bed;with bed alarm set;with family/visitor present Nurse Communication: Mobility status PT Visit Diagnosis: Other abnormalities of gait and mobility (R26.89)     Time: 0630-1601 PT Time Calculation (min) (ACUTE ONLY): 22 min  Charges:   $Gait Training: 8-22 mins                     Arlyce Dice, DPT Acute Rehabilitation Services Pager: [phone] Office: [place] E 09/10/2019, 2:34 PM

## 2019-09-10 NOTE — Care Management Obs Status (Signed)
[place] NOTIFICATION   Patient Details  Name: Bridget Cortez MRN: [id number] Date of Birth: 09-13-1945   Medicare Observation Status Notification Given:  Yes    Lennart Pall, [place] 09/10/2019, 2:16 PM

## 2019-09-10 NOTE — Plan of Care (Signed)
Plan of care reviewed and discussed with the patient. 

## 2019-09-10 NOTE — TOC Transition Note (Addendum)
Transition of Care [facility]) - CM/SW Discharge Note   Patient Details  Name: Bridget Cortez MRN: [id number] Date of Birth: 10-26-1945  [facility] of [facility] CM/SW Contact:  Lennart Pall, LCSW Phone Number: 09/10/2019, 10:04 AM   Clinical Narrative:    Met with pt this morning to confirm need for 3n1 and plan for HHPT, HHOT and HHaide via [facility].  Pt very anxious and insisting she must have assistance with bathing.  States spouse can manage meals.  Will [facility] with [facility] that they will provide bath aide.  Stressed to pt that this would only be a couple of times per week.  No further TOC needs.   Final next level of care: [place] Barriers to Discharge: No Barriers Identified   Patient Goals and CMS Choice Patient states their goals for this hospitalization and ongoing recovery are:: go home      Discharge Placement                       Discharge Plan and Services                DME Arranged: Bedside commode DME Agency: [employer]       HH Arranged: PT, Nurse's Aide [place] Agency: [facility] (formerly [employer])        Social Determinants of Health (SDOH) Interventions     Readmission Risk Interventions No flowsheet data found.

## 2019-09-10 NOTE — Progress Notes (Signed)
Subjective: 1 Day Post-Op Procedure(s) (LRB): RIGHT TOTAL KNEE ARTHROPLASTY (Right)  Patient is resting comfortable but is scared. She is in no real pain today.  Activity level:  wbat Diet tolerance:  ok Voiding:  ok Patient reports pain as mild.    Objective: Vital signs in last 24 hours: Temp:  [97.5 F (36.4 C)-98.9 F (37.2 C)] 98.6 F (37 C) (08/18 0645) Pulse Rate:  [60-88] 60 (08/18 0645) Resp:  [9-22] 18 (08/18 0645) BP: (114-143)/(69-100) 127/72 (08/18 0645) SpO2:  [95 %-100 %] 100 % (08/18 0645) Weight:  [106.4 kg] 106.4 kg (08/17 2045)  Labs: No results for input(s): HGB in the last 72 hours. No results for input(s): WBC, RBC, HCT, PLT in the last 72 hours. No results for input(s): NA, K, CL, CO2, BUN, CREATININE, GLUCOSE, CALCIUM in the last 72 hours. No results for input(s): LABPT, INR in the last 72 hours.  Physical Exam:  Neurologically intact ABD soft Neurovascular intact Sensation intact distally Intact pulses distally Dorsiflexion/Plantar flexion intact Incision: dressing C/D/I and no drainage No cellulitis present Compartment soft  Assessment/Plan:  1 Day Post-Op Procedure(s) (LRB): RIGHT TOTAL KNEE ARTHROPLASTY (Right) Advance diet Up with therapy D/C IV fluids Plan for discharge tomorrow Discharge home with home health after cleared by PT. Continue on 81mg  asa BID x 2 weeks post op for dvt prevention. Follow up in office 2 weeks post op.  Larwance Sachs Mackinley Kiehn 09/10/2019, 8:17 AM

## 2019-09-10 NOTE — Progress Notes (Signed)
Physical Therapy Treatment Patient Details Name: Bridget Cortez MRN: [id number] DOB: 1945-05-01 Today's Date: 09/10/2019    History of Present Illness Pt is a 74 year old female s/p right TKA.  PMH of asthma, chiari malformation, HTN, breast CA, PNA    PT Comments    Pt perseverating on cold breakfast this morning.  Pt assisted with ambulating however required short distances and seated rest break.  Pt reports she would like aide at home upon d/c since spouse has back issues and sciatica.  Will return to continue safe mobility with pt in preparation for d/c home.   Follow Up Recommendations  Home health PT;Follow surgeon's recommendation for DC plan and follow-up therapies     Equipment Recommendations  3in1 (PT)    Recommendations for Other Services       Precautions / Restrictions Precautions Precautions: Fall;Knee Restrictions Weight Bearing Restrictions: No    Mobility  Bed Mobility               General bed mobility comments: pt sitting EOB on arrival  Transfers Overall transfer level: Needs assistance Equipment used: Rolling walker (2 wheeled) Transfers: Sit to/from [employer] Transfers Sit to Stand: +2 safety/equipment;Min assist         General transfer comment: verbal cues for positioning and weight shifting, assist to rise and steady especially after rest breaks (from walking)  Ambulation/Gait Ambulation/Gait assistance: Min assist;+2 safety/equipment Gait Distance (Feet): 20 Feet Assistive device: Rolling walker (2 wheeled) Gait Pattern/deviations: Step-to pattern;Decreased stance time - right;Antalgic Gait velocity: decr   General Gait Details: verbal cues for sequence, RW positioning, posture; min assist for one instance of buckling; recliner following for safety; 20'x1, seated rest break, 30'x1   Stairs             Wheelchair Mobility    Modified Rankin (Stroke Patients Only)       Balance                                             Cognition Arousal/Alertness: Awake/alert Behavior During Therapy: WFL for tasks assessed/performed Overall Cognitive Status: Within Functional Limits for tasks assessed                                        Exercises      General Comments        Pertinent Vitals/Pain Pain Assessment: Faces Faces Pain Scale: Hurts little more Pain Location: right knee Pain Descriptors / Indicators: Aching;Grimacing;Sore Pain Intervention(s): Repositioned;Monitored during session    Home Living                      Prior Function            PT Goals (current goals can now be found in the care plan section) Progress towards PT goals: Progressing toward goals    Frequency    7X/week      PT Plan Current plan remains appropriate    Co-evaluation              AM-PAC PT "6 Clicks" Mobility   Outcome Measure  Help needed turning from your back to your side while in a flat bed without using bedrails?: A Little Help needed moving from lying on your back  to sitting on the side of a flat bed without using bedrails?: A Little Help needed moving to and from a bed to a chair (including a wheelchair)?: A Little Help needed standing up from a chair using your arms (e.g., wheelchair or bedside chair)?: A Little Help needed to walk in hospital room?: A Little Help needed climbing 3-5 steps with a railing? : A Lot 6 Click Score: 17    End of Session Equipment Utilized During Treatment: Gait belt Activity Tolerance: Patient tolerated treatment well Patient left: in chair;with call bell/phone within reach;with chair alarm set   PT Visit Diagnosis: Other abnormalities of gait and mobility (R26.89)     Time: 5883-2549 PT Time Calculation (min) (ACUTE ONLY): 21 min  Charges:  $Gait Training: 8-22 mins                     Arlyce Dice, DPT Acute Rehabilitation Services Pager: [phone] Office:  [place] E 09/10/2019, 1:00 PM

## 2019-09-11 ENCOUNTER — Encounter (HOSPITAL_COMMUNITY): Payer: Self-pay | Admitting: Orthopaedic Surgery

## 2019-09-11 MED ORDER — ASPIRIN EC 81 MG PO TBEC: 81.0000 mg | DELAYED_RELEASE_TABLET | Freq: Two times a day (BID) | ORAL | 0 refills | Status: DC

## 2019-09-11 MED ORDER — HYDROCODONE-ACETAMINOPHEN 5-325 MG PO TABS: 1.0000 | ORAL_TABLET | Freq: Four times a day (QID) | ORAL | 0 refills | Status: DC | PRN

## 2019-09-11 MED ORDER — TIZANIDINE HCL 4 MG PO TABS
4.0000 mg | ORAL_TABLET | Freq: Four times a day (QID) | ORAL | 1 refills | Status: DC | PRN
Start: 2019-09-11 — End: 2019-10-27

## 2019-09-11 MED ORDER — ROPIVACAINE HCL 7.5 MG/ML IJ SOLN
INTRAMUSCULAR | Status: DC | PRN
  Administered 2019-09-09: 20 mL via PERINEURAL

## 2019-09-11 MED ORDER — BISACODYL 10 MG RE SUPP
10.0000 mg | Freq: Once | RECTAL | Status: AC
  Administered 2019-09-12: 10 mg via RECTAL
  Filled 2019-09-11 (×2): qty 1

## 2019-09-11 NOTE — Plan of Care (Signed)

## 2019-09-11 NOTE — Progress Notes (Signed)
Subjective: 2 Days Post-Op Procedure(s) (LRB): RIGHT TOTAL KNEE ARTHROPLASTY (Right)   Patient is looking to go home today but is nervous. She does have a home health aid along with PT and OT already set up to tak ecare of her at home.   Activity level:  wbat Diet tolerance:  ok Voiding:  No BM yet but urinating Patient reports pain as mild and moderate.    Objective: Vital signs in last 24 hours: Temp:  [98 F (36.7 C)-98.6 F (37 C)] 98 F (36.7 C) (08/19 0538) Pulse Rate:  [60-71] 60 (08/19 0538) Resp:  [15-18] 18 (08/19 0538) BP: (111-120)/(62-71) 113/62 (08/19 0538) SpO2:  [99 %-100 %] 99 % (08/19 0538)  Labs: No results for input(s): HGB in the last 72 hours. No results for input(s): WBC, RBC, HCT, PLT in the last 72 hours. No results for input(s): NA, K, CL, CO2, BUN, CREATININE, GLUCOSE, CALCIUM in the last 72 hours. No results for input(s): LABPT, INR in the last 72 hours.  Physical Exam:  Neurologically intact ABD soft Neurovascular intact Sensation intact distally Intact pulses distally Dorsiflexion/Plantar flexion intact Incision: dressing C/D/I and scant drainage No cellulitis present Compartment soft  Assessment/Plan:  2 Days Post-Op Procedure(s) (LRB): RIGHT TOTAL KNEE ARTHROPLASTY (Right) Advance diet Up with therapy Discharge home with home health today after PT. Continue on asa 81 mg BID x 2 weeks post op for DVT prevention. Follow up in office 2 weeks post op. She will have a home health aid, PT, and OT at home to help her. We will also give her a dulcolax suppository to help her have  BM prior to leaving.   Larwance Sachs Mercedez Boule 09/11/2019, 7:05 AM

## 2019-09-11 NOTE — Anesthesia Postprocedure Evaluation (Addendum)
Anesthesia Post Note  Patient: Bridget Cortez  Procedure(s) Performed: RIGHT TOTAL KNEE ARTHROPLASTY (Right Knee)     Patient location during evaluation: PACU Anesthesia Type: Regional and General Level of consciousness: awake and alert Pain management: pain level controlled Vital Signs Assessment: post-procedure vital signs reviewed and stable Respiratory status: spontaneous breathing, nonlabored ventilation, respiratory function stable and patient connected to nasal cannula oxygen Cardiovascular status: blood pressure returned to baseline and stable Postop Assessment: no apparent nausea or vomiting Anesthetic complications: no   No complications documented.  Last Vitals:  Vitals:   09/10/19 2020 09/11/19 0538  BP: 111/65 113/62  Pulse: 71 60  Resp: 18 18  Temp: 36.7 C 36.7 C  SpO2: 100% 99%    Last Pain:  Vitals:   09/11/19 0701  TempSrc:   PainSc: 4                  Shaunda Tipping

## 2019-09-11 NOTE — Care Plan (Signed)
Spoke with patient's husband this am. They are declining SNF placement. She is set up with [facility] for HHPT/OT and Engineer, manufacturing. She states that her daughter is too busy to help her and she will not consent to [place] reaching out to her.   Will follow with the MD/PA and [facility] team to have the best discharge plan for patient    Florinda Marker  [phone]

## 2019-09-11 NOTE — Progress Notes (Signed)
Physical Therapy Treatment Patient Details Name: Bridget Cortez MRN: [id number] DOB: Mar 20, 1945 Today's Date: 09/11/2019    History of Present Illness Pt is a 74 year old female s/p right TKA.  PMH of asthma, chiari malformation, HTN, breast CA, PNA    PT Comments    Pt reports more pain today and having much more difficulty with mobilizing.  Pt requiring at least 10 minutes to get to EOB and then another 10 minutes to perform standing.  Pt only able to ambulate 8 feet total and required seated rest break.  Pt did stand at stairs and educated on technique with cues and demonstration however pt unable to process and perform tasks to actually step up.  Pt does not appear safe to d/c home today.  Would recommend SNF at this time as pt continues to need help with mobility and spouse unable to assist.  If home, may need ambulance transport.  Lack of progress discussed with RN and TOC team member.    Home health PT;Follow surgeon's recommendation for DC plan and follow-up therapies     Equipment Recommendations  3in1 (PT)    Recommendations for Other Services       Precautions / Restrictions Precautions Precautions: Fall;Knee Restrictions Weight Bearing Restrictions: No    Mobility  Bed Mobility Overal bed mobility: Needs Assistance Bed Mobility: Supine to Sit     Supine to sit: Min assist     General bed mobility comments: pt encouraged to perform without assist however requires over 10 minutes to attempt to get to EOB, still required slight assist for R LE over EOB  Transfers Overall transfer level: Needs assistance Equipment used: Rolling walker (2 wheeled) Transfers: Sit to/from Stand Sit to Stand: Min assist         General transfer comment: verbal cues for positioning and weight shifting, assist to rise and steady; pt with difficulty weight shifting as she keeps weight posteriorly and requires increased time, cues for improving speed however this is very  difficulty for pt, sit to stand also requiring at least 10 minutes to finally stand upright  Ambulation/Gait Ambulation/Gait assistance: Min guard Gait Distance (Feet): 4 Feet (x2) Assistive device: Rolling walker (2 wheeled) Gait Pattern/deviations: Step-to pattern;Decreased stance time - right;Antalgic Gait velocity: decr   General Gait Details: verbal cues for sequence, RW positioning, posture; recliner following for safety; pt required one seated rest break due to UE fatigue; less distance tolerated today   Stairs Stairs:  (pt unable to process sequencing and multimodal cues provided)           Wheelchair Mobility    Modified Rankin (Stroke Patients Only)       Balance                                            Cognition Arousal/Alertness: Awake/alert Behavior During Therapy: WFL for tasks assessed/performed                                   General Comments: pt with problem solving difficulties and slow processing despite time provided and multimodal cues      Exercises      General Comments        Pertinent Vitals/Pain Pain Assessment: Faces Faces Pain Scale: Hurts even more Pain Location: right knee Pain  Descriptors / Indicators: Aching;Grimacing;Sore Pain Intervention(s): Monitored during session;Patient requesting pain meds-RN notified;Repositioned    Home Living                      Prior Function            PT Goals (current goals can now be found in the care plan section) Progress towards PT goals: Progressing toward goals    Frequency    7X/week      PT Plan Discharge plan needs to be updated    Co-evaluation              AM-PAC PT "6 Clicks" Mobility   Outcome Measure  Help needed turning from your back to your side while in a flat bed without using bedrails?: A Little Help needed moving from lying on your back to sitting on the side of a flat bed without using bedrails?: A  Little Help needed moving to and from a bed to a chair (including a wheelchair)?: A Little Help needed standing up from a chair using your arms (e.g., wheelchair or bedside chair)?: A Little Help needed to walk in hospital room?: A Little Help needed climbing 3-5 steps with a railing? : Total 6 Click Score: 16    End of Session Equipment Utilized During Treatment: Gait belt Activity Tolerance: Patient limited by fatigue Patient left: with call bell/phone within reach;with family/visitor present;in chair;with chair alarm set Nurse Communication: Mobility status PT Visit Diagnosis: Other abnormalities of gait and mobility (R26.89)     Time: 3267-1245 PT Time Calculation (min) (ACUTE ONLY): 37 min  Charges:  $Gait Training: 23-37 mins                     Jannette Spanner PT, DPT Acute Rehabilitation Services Pager: [phone] Office: [phone]  York Ram E 09/11/2019, 1:21 PM

## 2019-09-11 NOTE — Progress Notes (Signed)
Physical Therapy Treatment Patient Details Name: Bridget Cortez MRN: [id number] DOB: [birth year] Today's Date: 09/11/2019    History of Present Illness Pt is a 74 year old female s/p right TKA.  PMH of asthma, chiari malformation, HTN, breast CA, PNA    PT Comments    Pt eager to try steps again, stating she has had lunch and pain meds.  Pt continues to fatigue quickly however and again required a seated rest break for ambulation.  Pt also required over 20 minutes to perform steps due to numerous attempts with different techniques.  Pt trying to follow cues however spouse also providing incorrect cues (attempted to have RN intervene and talk to spouse so pt could focus on therapist instructions).  Pt also appears very anxious and apprehensive with stair negotiation.  Pt not safe to d/c home today.  Continue to recommend SNF however plan is for home.  Discussed pt performance with RN.   Follow Up Recommendations  Follow surgeon's recommendation for DC plan and follow-up therapies;SNF;Supervision/Assistance - 24 hour     Equipment Recommendations  3in1 (PT)    Recommendations for Other Services       Precautions / Restrictions Precautions Precautions: Fall;Knee Restrictions Weight Bearing Restrictions: No    Mobility  Bed Mobility Overal bed mobility: Needs Assistance Bed Mobility: Supine to Sit     Supine to sit: Min assist     General bed mobility comments: pt encouraged to perform without assist however requires over 10 minutes to attempt to get to EOB, still required slight assist for R LE over EOB  Transfers Overall transfer level: Needs assistance Equipment used: Rolling walker (2 wheeled) Transfers: Sit to/from Stand Sit to Stand: Min assist         General transfer comment: verbal cues for positioning and weight shifting, assist to rise and steady; pt with difficulty weight shifting as she keeps weight posteriorly and requires increased time, cues for  improving speed however this is very difficulty for pt  Ambulation/Gait Ambulation/Gait assistance: Min assist;+2 safety/equipment Gait Distance (Feet): 5 Feet (x2) Assistive device: Rolling walker (2 wheeled) Gait Pattern/deviations: Step-to pattern;Decreased stance time - right;Antalgic Gait velocity: decr   General Gait Details: verbal cues for sequence, RW positioning, posture; recliner following for safety; pt required one seated rest break due to UE fatigue; less distance tolerated today   Stairs Stairs: Yes Stairs assistance: Min assist;Mod assist Stair Management: Forwards;Two rails Number of Stairs: 2 General stair comments: pt required over 15 minutes with attempts to perform first step and choosing safest technique for pt; attempted with only right rail with both hands however pt unable to lift left foot to step up, attempted backwards with RW however again pt unable to step up; pt determined to try so requested pt just lift Left foot from floor to check for weight shifting however pt unable, pt appears more anxious and spouse also present and attempting to incorrectly cue pt during all this; so pt assisted with stepping up with both rails and multimodal cues for technique.  pt with weak UEs or ability for UEs to assist so therapist did provide min to mod assist at times; pt extremely fatigued and perspiring upon finishing steps so recliner brought to pt   Wheelchair Mobility    Modified Rankin (Stroke Patients Only)       Balance  Cognition Arousal/Alertness: Awake/alert Behavior During Therapy: WFL for tasks assessed/performed;Anxious                                   General Comments: pt with problem solving difficulties and slow processing despite time provided and multimodal cues -- this could also possibly be more from anxiety ?      Exercises      General Comments         Pertinent Vitals/Pain Pain Assessment: 0-10 Faces Pain Scale: Hurts whole lot Pain Location: right knee Pain Descriptors / Indicators: Aching;Grimacing;Sore Pain Intervention(s): Repositioned;Patient requesting pain meds-RN notified;Monitored during session;Ice applied    Home Living                      Prior Function            PT Goals (current goals can now be found in the care plan section) Progress towards PT goals: Progressing toward goals    Frequency    7X/week      PT Plan Current plan remains appropriate    Co-evaluation              AM-PAC PT "6 Clicks" Mobility   Outcome Measure  Help needed turning from your back to your side while in a flat bed without using bedrails?: A Little Help needed moving from lying on your back to sitting on the side of a flat bed without using bedrails?: A Little Help needed moving to and from a bed to a chair (including a wheelchair)?: A Little Help needed standing up from a chair using your arms (e.g., wheelchair or bedside chair)?: A Little Help needed to walk in hospital room?: A Lot Help needed climbing 3-5 steps with a railing? : A Lot 6 Click Score: 16    End of Session Equipment Utilized During Treatment: Gait belt Activity Tolerance: Patient limited by fatigue Patient left: with call bell/phone within reach;with family/visitor present;in chair;with chair alarm set Nurse Communication: Mobility status PT Visit Diagnosis: Other abnormalities of gait and mobility (R26.89)     Time: 3151-7616 PT Time Calculation (min) (ACUTE ONLY): 40 min  Charges:  $Gait Training: 38-52 mins                    Jannette Spanner PT, DPT Acute Rehabilitation Services Pager: [phone] Office: [phone]  Trena Platt 09/11/2019, 4:23 PM

## 2019-09-11 NOTE — Anesthesia Procedure Notes (Signed)
Anesthesia Regional Block: Adductor canal block   Pre-Anesthetic Checklist: ,, timeout performed, Correct Patient, Correct Site, Correct Laterality, Correct Procedure, Correct Position, site marked, Risks and benefits discussed,  Surgical consent,  Pre-op evaluation,  At surgeon's request and post-op pain management  Laterality: Right and Lower  Prep: chloraprep       Needles:  Injection technique: Single-shot     Needle Length: 9cm  Needle Gauge: 22     Additional Needles: Arrow StimuQuik ECHO Echogenic Stimulating PNB Needle  Procedures:,,,, ultrasound used (permanent image in chart),,,,  Narrative:  Start time: 09/09/2019 10:31 AM End time: 09/09/2019 10:33 AM Injection made incrementally with aspirations every 5 mL.  Performed by: Personally  Anesthesiologist: Oleta Mouse, MD

## 2019-09-11 NOTE — Discharge Summary (Addendum)
Patient ID: Bridget Cortez MRN: [id number] DOB/AGE: [birth year] 74 y.o.  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 eye    cataract removed.   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 female who was admitted 09/09/2019 for operative treatment ofPrimary osteoarthritis of right knee. Patient has severe unremitting pain that affects sleep, daily activities, and work/hobbies. After pre-op clearance the patient was taken to the operating room on 09/09/2019 and underwent  Procedure(s): RIGHT TOTAL KNEE ARTHROPLASTY.    Patient was given perioperative antibiotics:  Anti-infectives (From admission, onward)   Start     Dose/Rate Route Frequency Ordered Stop   09/09/19 2200  vancomycin (VANCOCIN) IVPB 1000 mg/200 mL premix        1,000 mg 200 mL/hr over 60 Minutes Intravenous Every 12 hours 09/09/19 1424 09/09/19 2255   09/09/19 0845  ceFAZolin (ANCEF) IVPB 2g/100 mL premix        2 g 200 mL/hr  over 30 Minutes Intravenous On call to O.R. 09/09/19 7341 09/09/19 1030   09/09/19 0600  vancomycin (VANCOREADY) IVPB 1500 mg/300 mL        1,500 mg 150 mL/hr over 120 Minutes Intravenous 30 min pre-op 09/08/19 0743 09/09/19 1133       Patient was given sequential compression devices, early ambulation, and chemoprophylaxis to prevent DVT.  Patient benefited maximally from hospital stay and there were no complications.    Recent vital signs:  Patient Vitals for the past 24 hrs:  BP Temp Temp src Pulse Resp SpO2  09/11/19 0538 113/62 98 F (36.7 C) Oral 60 18 99 %  09/10/19 2020 111/65 98 F (36.7 C) Oral 71 18 100 %  09/10/19 1345 120/71 98.6 F (37 C) Oral 63 15 100 %     Recent laboratory studies: No results for input(s): WBC, HGB, HCT, PLT, NA, K, CL, CO2, BUN, CREATININE, GLUCOSE, INR, CALCIUM in the last 72 hours.  Invalid input(s): PT, 2   Discharge Medications:   Allergies as of 09/11/2019      Reactions   Ephedrine Shortness Of Breath   Cortisone Other (See Comments)   Severe muscle spasms      Medication List    STOP taking these medications   oxyCODONE 5 MG immediate release tablet Commonly known as: Oxy IR/ROXICODONE     TAKE these medications   acetaminophen 325 MG tablet  Commonly known as: TYLENOL Take 2 tablets (650 mg total) by mouth every 6 (six) hours as needed for mild pain, fever or headache (or Fever >/= 101).   albuterol (5 MG/ML) 0.5% nebulizer solution Commonly known as: PROVENTIL Take 0.5 mLs (2.5 mg total) by nebulization every 6 (six) hours as needed for wheezing or shortness of breath.   amLODipine 10 MG tablet Commonly known as: NORVASC Take 5 mg by mouth at bedtime.   aspirin EC 81 MG tablet Take 1 tablet (81 mg total) by mouth 2 (two) times daily after a meal. What changed: when to take this   Bystolic 5 MG tablet Generic drug: nebivolol Take 1 tablet by mouth once daily What changed: how much to take   CALTRATE 600+D  PO Take 1 tablet by mouth daily with lunch.   cetirizine 10 MG tablet Commonly known as: ZYRTEC Take 10 mg by mouth daily.   esomeprazole 40 MG capsule Commonly known as: NEXIUM Take 40 mg by mouth 2 (two) times daily before a meal.   fluticasone 50 MCG/ACT nasal spray Commonly known as: FLONASE Place 1 spray into both nostrils at bedtime.   Fluticasone-Salmeterol 250-50 MCG/DOSE Aepb Commonly known as: ADVAIR Inhale 1 puff into the lungs every 12 (twelve) hours.   HYDROcodone-acetaminophen 5-325 MG tablet Commonly known as: NORCO/VICODIN Take 1-2 tablets by mouth every 6 (six) hours as needed for moderate pain or severe pain (post op pain).   losartan 100 MG tablet Commonly known as: COZAAR Take 100 mg by mouth daily.   multivitamin tablet Take 1 tablet by mouth daily with lunch.   potassium chloride SA 20 MEQ tablet Commonly known as: KLOR-CON Take 60 mEq by mouth daily.   rosuvastatin 5 MG tablet Commonly known as: CRESTOR Take 5 mg by mouth daily.   simethicone 125 MG chewable tablet Commonly known as: MYLICON Chew 809 mg by mouth every 6 (six) hours as needed for flatulence.   tiZANidine 4 MG tablet Commonly known as: Zanaflex Take 1 tablet (4 mg total) by mouth every 6 (six) hours as needed for muscle spasms.   vitamin C 500 MG tablet Commonly known as: ASCORBIC ACID Take 500 mg by mouth daily.            Durable Medical Equipment  (From admission, onward)         Start     Ordered   09/09/19 1425  DME Walker rolling  Once       Question:  Patient needs a walker to treat with the following condition  Answer:  Primary osteoarthritis of right knee   09/09/19 1424   09/09/19 1425  DME 3 n 1  Once        09/09/19 1424   09/09/19 1425  DME Bedside commode  Once       Question:  Patient needs a bedside commode to treat with the following condition  Answer:  Primary osteoarthritis of right knee   09/09/19 1424          Diagnostic Studies: No  results found.  Disposition: Discharge disposition: 01-Home or Self Care       Discharge Instructions    Call MD / Call 911   Complete by: As directed    If you experience chest pain or shortness of breath, CALL 911 and be transported to the hospital emergency room.  If you develope a fever above 101 F, pus (white drainage) or increased drainage or redness at the wound, or  calf pain, call your surgeon's office.   Constipation Prevention   Complete by: As directed    Drink plenty of fluids.  Prune juice may be helpful.  You may use a stool softener, such as Colace (over the counter) 100 mg twice a day.  Use MiraLax (over the counter) for constipation as needed.   Diet - low sodium heart healthy   Complete by: As directed    Discharge instructions   Complete by: As directed    INSTRUCTIONS AFTER JOINT REPLACEMENT   Remove items at home which could result in a fall. This includes throw rugs or furniture in walking pathways ICE to the affected joint every three hours while awake for 30 minutes at a time, for at least the first 3-5 days, and then as needed for pain and swelling.  Continue to use ice for pain and swelling. You may notice swelling that will progress down to the foot and ankle.  This is normal after surgery.  Elevate your leg when you are not up walking on it.   Continue to use the breathing machine you got in the hospital (incentive spirometer) which will help keep your temperature down.  It is common for your temperature to cycle up and down following surgery, especially at night when you are not up moving around and exerting yourself.  The breathing machine keeps your lungs expanded and your temperature down.   DIET:  As you were doing prior to hospitalization, we recommend a well-balanced diet.  DRESSING / WOUND CARE / SHOWERING  You may shower 3 days after surgery, but keep the wounds dry during showering.  You may use an occlusive plastic wrap (Press'n Seal for example),  NO SOAKING/SUBMERGING IN THE BATHTUB.  If the bandage gets wet, change with a clean dry gauze.  If the incision gets wet, pat the wound dry with a clean towel.  ACTIVITY  Increase activity slowly as tolerated, but follow the weight bearing instructions below.   No driving for 6 weeks or until further direction given by your physician.  You cannot drive while taking narcotics.  No lifting or carrying greater than 10 lbs. until further directed by your surgeon. Avoid periods of inactivity such as sitting longer than an hour when not asleep. This helps prevent blood clots.  You may return to work once you are authorized by your doctor.     WEIGHT BEARING   Weight bearing as tolerated with assist device (walker, cane, etc) as directed, use it as long as suggested by your surgeon or therapist, typically at least 4-6 weeks.   EXERCISES  Results after joint replacement surgery are often greatly improved when you follow the exercise, range of motion and muscle strengthening exercises prescribed by your doctor. Safety measures are also important to protect the joint from further injury. Any time any of these exercises cause you to have increased pain or swelling, decrease what you are doing until you are comfortable again and then slowly increase them. If you have problems or questions, call your caregiver or physical therapist for advice.   Rehabilitation is important following a joint replacement. After just a few days of immobilization, the muscles of the leg can become weakened and shrink (atrophy).  These exercises are designed to build up the tone and strength of the thigh and leg muscles and to improve motion. Often times heat used for twenty to thirty minutes before working out will loosen up your tissues and help with improving the range  of motion but do not use heat for the first two weeks following surgery (sometimes heat can increase post-operative swelling).   These exercises can be done  on a training (exercise) mat, on the floor, on a table or on a bed. Use whatever works the best and is most comfortable for you.    Use music or television while you are exercising so that the exercises are a pleasant break in your day. This will make your life better with the exercises acting as a break in your routine that you can look forward to.   Perform all exercises about fifteen times, three times per day or as directed.  You should exercise both the operative leg and the other leg as well.  Exercises include:   Quad Sets - Tighten up the muscle on the front of the thigh (Quad) and hold for 5-10 seconds.   Straight Leg Raises - With your knee straight (if you were given a brace, keep it on), lift the leg to 60 degrees, hold for 3 seconds, and slowly lower the leg.  Perform this exercise against resistance later as your leg gets stronger.  Leg Slides: Lying on your back, slowly slide your foot toward your buttocks, bending your knee up off the floor (only go as far as is comfortable). Then slowly slide your foot back down until your leg is flat on the floor again.  Angel Wings: Lying on your back spread your legs to the side as far apart as you can without causing discomfort.  Hamstring Strength:  Lying on your back, push your heel against the floor with your leg straight by tightening up the muscles of your buttocks.  Repeat, but this time bend your knee to a comfortable angle, and push your heel against the floor.  You may put a pillow under the heel to make it more comfortable if necessary.   A rehabilitation program following joint replacement surgery can speed recovery and prevent re-injury in the future due to weakened muscles. Contact your doctor or a physical therapist for more information on knee rehabilitation.    CONSTIPATION  Constipation is defined medically as fewer than three stools per week and severe constipation as less than one stool per week.  Even if you have a regular  bowel pattern at home, your normal regimen is likely to be disrupted due to multiple reasons following surgery.  Combination of anesthesia, postoperative narcotics, change in appetite and fluid intake all can affect your bowels.   YOU MUST use at least one of the following options; they are listed in order of increasing strength to get the job done.  They are all available over the counter, and you may need to use some, POSSIBLY even all of these options:    Drink plenty of fluids (prune juice may be helpful) and high fiber foods Colace 100 mg by mouth twice a day  Senokot for constipation as directed and as needed Dulcolax (bisacodyl), take with full glass of water  Miralax (polyethylene glycol) once or twice a day as needed.  If you have tried all these things and are unable to have a bowel movement in the first 3-4 days after surgery call either your surgeon or your primary doctor.    If you experience loose stools or diarrhea, hold the medications until you stool forms back up.  If your symptoms do not get better within 1 week or if they get worse, check with your doctor.  If you experience "  the worst abdominal pain ever" or develop nausea or vomiting, please contact the office immediately for further recommendations for treatment.   ITCHING:  If you experience itching with your medications, try taking only a single pain pill, or even half a pain pill at a time.  You can also use Benadryl over the counter for itching or also to help with sleep.   TED HOSE STOCKINGS:  Use stockings on both legs until for at least 2 weeks or as directed by physician office. They may be removed at night for sleeping.  MEDICATIONS:  See your medication summary on the "After Visit Summary" that nursing will review with you.  You may have some home medications which will be placed on hold until you complete the course of blood thinner medication.  It is important for you to complete the blood thinner medication as  prescribed.  PRECAUTIONS:  If you experience chest pain or shortness of breath - call 911 immediately for transfer to the hospital emergency department.   If you develop a fever greater that 101 F, purulent drainage from wound, increased redness or drainage from wound, foul odor from the wound/dressing, or calf pain - CONTACT YOUR SURGEON.                                                   FOLLOW-UP APPOINTMENTS:  If you do not already have a post-op appointment, please call the office for an appointment to be seen by your surgeon.  Guidelines for how soon to be seen are listed in your "After Visit Summary", but are typically between 1-4 weeks after surgery.  OTHER INSTRUCTIONS:   Knee Replacement:  Do not place pillow under knee, focus on keeping the knee straight while resting. CPM instructions: 0-90 degrees, 2 hours in the morning, 2 hours in the afternoon, and 2 hours in the evening. Place foam block, curve side up under heel at all times except when in CPM or when walking.  DO NOT modify, tear, cut, or change the foam block in any way.   DENTAL ANTIBIOTICS:  In most cases prophylactic antibiotics for Dental procdeures after total joint surgery are not necessary.  Exceptions are as follows:  1. History of prior total joint infection  2. Severely immunocompromised (Organ Transplant, cancer chemotherapy, Rheumatoid biologic meds such as [facility])  3. Poorly controlled diabetes (A1C &gt; 8.0, blood glucose over 200)  If you have one of these conditions, contact your surgeon for an antibiotic prescription, prior to your dental procedure.   MAKE SURE YOU:  Understand these instructions.  Get help right away if you are not doing well or get worse.    Thank you for letting us be a part of your medical care team.  It is a privilege we respect greatly.  We hope these instructions will help you stay on track for a fast and full recovery!   Increase activity slowly as tolerated   Complete  by: As directed        Follow-up Information    Melrose Nakayama, MD. Go on 09/19/2019.   Specialty: Orthopedic Surgery Why: Your appointment has been scheduled for 10:45  Contact information: [place] [id number] [phone]        [facility] Follow up.   Specialty: Home Health Services Why: Home health physical therapy and  a bath aide will come out to see you at home prior to you starting outpatient physical therapy after your MD follow up  Contact information: [place] [address] [phone]        [facility], [place]. Go on 09/19/2019.   Why: You are scheduled to start outpatient physical therapy at 12:00. Please go over to the therapy desk after your MD appointment to complete your paperwork  Contact information: [place] [id number] [phone]                Signed: Larwance Sachs Yasir Kitner 09/11/2019, 7:10 AM

## 2019-09-12 MED ORDER — FLEET ENEMA 7-19 GM/118ML RE ENEM
1.0000 | ENEMA | Freq: Once | RECTAL | Status: AC
  Administered 2019-09-12: 1 via RECTAL
  Filled 2019-09-12: qty 1

## 2019-09-12 NOTE — Progress Notes (Signed)
Subjective: 3 Days Post-Op Procedure(s) (LRB): RIGHT TOTAL KNEE ARTHROPLASTY (Right)   Patient feels better today but has not had a BM yet which is her biggest complaint at this time. She would like to go home today if she passes PT.  Activity level:  wbat Diet tolerance:  ok Voiding:  No BM yet Patient reports pain as mild.    Objective: Vital signs in last 24 hours: Temp:  [98.6 F (37 C)-99.7 F (37.6 C)] 99.7 F (37.6 C) (08/20 0539) Pulse Rate:  [72-87] 87 (08/20 0539) Resp:  [18] 18 (08/20 0539) BP: (121-137)/(67-75) 137/67 (08/20 0539) SpO2:  [100 %] 100 % (08/20 0539)  Labs: No results for input(s): HGB in the last 72 hours. No results for input(s): WBC, RBC, HCT, PLT in the last 72 hours. No results for input(s): NA, K, CL, CO2, BUN, CREATININE, GLUCOSE, CALCIUM in the last 72 hours. No results for input(s): LABPT, INR in the last 72 hours.  Physical Exam:  Neurologically intact ABD soft Neurovascular intact Sensation intact distally Intact pulses distally Dorsiflexion/Plantar flexion intact Incision: dressing C/D/I and no drainage No cellulitis present Compartment soft  Assessment/Plan:  3 Days Post-Op Procedure(s) (LRB): RIGHT TOTAL KNEE ARTHROPLASTY (Right) Advance diet Up with therapy Discharge home with home health today if she has had a BM and has passed physical therapy. Continue on 81mg  asa BID x 2 weeks post op for DVT prevention. Follow up in office 2 weeks post op. I received daughters phone number today and will update them on her progress. I will call at lunch time to check on the patient.   Larwance Sachs Kamille Toomey 09/12/2019, 8:17 AM

## 2019-09-12 NOTE — Progress Notes (Addendum)
Physical Therapy Treatment Patient Details Name: Bridget Cortez MRN: [id number] DOB: Mar 28, 1945 Today's Date: 09/12/2019    History of Present Illness Pt is a 75 year old female s/p right TKA.  PMH of asthma, chiari malformation, HTN, breast CA, PNA    PT Comments    Pt ambulated again and did not require seated rest break this afternoon.  Pt also performed steps twice, attempting to use only one rail (as at home) however pt required both rails for support.  Pt reports she can use her quad cane at home (in replacement of one rail) for steps and have grandsons assist her with steps to get inside house.  Pt prefers to d/c home today and feels she will be able to function at home with family assist.  HHPT supposed to have visit tomorrow as well.     Follow Up Recommendations  Follow surgeons recommendation for DC plan and follow-up therapies;Supervision/Assistance - 24 hour     Equipment Recommendations  3in1 (PT)    Recommendations for Other Services       Precautions / Restrictions Precautions Precautions: Fall;Knee Restrictions Weight Bearing Restrictions: No    Mobility  Bed Mobility Overal bed mobility: Needs Assistance Bed Mobility: Sit to Supine       Sit to supine: Mod assist   General bed mobility comments: required assist for bil LEs  Transfers Overall transfer level: Needs assistance Equipment used: Rolling walker (2 wheeled) Transfers: Sit to/from Stand Sit to Stand: Min guard         General transfer comment: verbal cues for positioning and weight shifting, increased time and effort  Ambulation/Gait Ambulation/Gait assistance: Min guard Gait Distance (Feet): 80 Feet Assistive device: Rolling walker (2 wheeled) Gait Pattern/deviations: Step-to pattern;Decreased stance time - right;Antalgic Gait velocity: decr   General Gait Details: verbal cues for sequence, RW positioning, posture; recliner following for safety; no rest break  required   Stairs Stairs: Yes Stairs assistance: Min guard Stair Management: Forwards;Two rails Number of Stairs: 3 General stair comments: pt unable to perform with only one rail after multiple attemps so performed with 2 rails, performed twice, pt later reports she has quad cane at home and can use quad cane for support, strongly recommended pt have grandsons assist pt with steps and use gait belt for safety; pt agreeable   Wheelchair Mobility    Modified Rankin (Stroke Patients Only)       Balance                                            Cognition Arousal/Alertness: Awake/alert Behavior During Therapy: WFL for tasks assessed/performed;Anxious Overall Cognitive Status: Within Functional Limits for tasks assessed                                        Exercises      General Comments        Pertinent Vitals/Pain Pain Assessment: Faces Faces Pain Scale: Hurts a little bit Pain Location: right knee Pain Descriptors / Indicators: Aching;Grimacing;Sore Pain Intervention(s): Repositioned;Monitored during session;Premedicated before session;Ice applied    Home Living                      Prior Function  PT Goals (current goals can now be found in the care plan section) Progress towards PT goals: Progressing toward goals    Frequency    7X/week      PT Plan Current plan remains appropriate    Co-evaluation              AM-PAC PT "6 Clicks" Mobility   Outcome Measure  Help needed turning from your back to your side while in a flat bed without using bedrails?: A Little Help needed moving from lying on your back to sitting on the side of a flat bed without using bedrails?: A Little Help needed moving to and from a bed to a chair (including a wheelchair)?: A Little Help needed standing up from a chair using your arms (e.g., wheelchair or bedside chair)?: A Little Help needed to walk in hospital  room?: A Little Help needed climbing 3-5 steps with a railing? : A Little 6 Click Score: 18    End of Session Equipment Utilized During Treatment: Gait belt Activity Tolerance: Patient tolerated treatment well Patient left: in bed;with call bell/phone within reach Nurse Communication: Mobility status PT Visit Diagnosis: Other abnormalities of gait and mobility (R26.89)     Time: 9179-1505 PT Time Calculation (min) (ACUTE ONLY): 36 min  Charges:  $Gait Training: 23-37 mins                     Jannette Spanner PT, DPT Acute Rehabilitation Services Pager: [phone] Office: [phone]  Trena Platt 09/12/2019, 5:37 PM

## 2019-09-12 NOTE — Care Management Important Message (Signed)
Important Message  Patient Details IM Letter given to the Patient Name: Bridget Cortez MRN: [id number] Date of Birth: [birth year]   Medicare Important Message Given:  Yes     Kerin Salen 09/12/2019, 11:31 AM

## 2019-09-12 NOTE — Evaluation (Addendum)
Occupational Therapy Evaluation Patient Details Name: Bridget Cortez MRN: [id number] DOB: Sep 15, 1945 Today's Date: 09/12/2019    History of Present Illness Pt is a 74 year old female s/p right TKA.  PMH of asthma, chiari malformation, HTN, breast CA, PNA   Clinical Impression   Bridget Cortez is a 74 year old woman s/p right TKA who presents with complaints of pain in right knee, decreased ROM and strength of right lower extremity, decreased upper extremity strength and endurance and decreased endurance resulting in impaired ability to perform independent functional mobility and ADLs. Patient set up for UB ADLs, min assist for toileting and mod assist for LB dressing and ambulation limited to [facility] and then to recliner. Patient's UB strength good with MMT but limited with functional activities - exhibiting difficulty compensating with upper body strength with use of walker and then with chair push up for scooting back in recliner. Patient pleasant, motivated and sometimes emotional in regards to her current abilities. Patient will benefit from skilled OT services to improve deficits and learn compensatory strategies in order to return home at discharge.     Follow Up Recommendations  Home health OT    Equipment Recommendations  3 in 1 bedside commode    Recommendations for Other Services       Precautions / Restrictions Precautions Precautions: Fall;Knee Restrictions Weight Bearing Restrictions: No      Mobility Bed Mobility Overal bed mobility: Needs Assistance Bed Mobility: Supine to Sit     Supine to sit: Min assist     General bed mobility comments: Min assist for RLE assist to transfer to edge of bed.  Transfers Overall transfer level: Needs assistance Equipment used: Rolling walker (2 wheeled) Transfers: Sit to/from Stand Sit to Stand: From elevated surface;Min assist Stand pivot transfers: Min assist       General transfer comment: Min assist to  power up from elevated bed height and steady. Min assist to pivot to [facility] - needing assistance to extend RLE. With steps to recliner patient min assist for walker management and persistent cues to use upper extremities on walker to assist with gait.    Balance Overall balance assessment: Needs assistance Sitting-balance support: No upper extremity supported;Feet supported Sitting balance-Leahy Scale: Good     Standing balance support: During functional activity Standing balance-Leahy Scale: Poor                             ADL either performed or assessed with clinical judgement   ADL Overall ADL's : Needs assistance/impaired Eating/Feeding: Independent   Grooming: Set up;Sitting;Oral care Grooming Details (indicate cue type and reason): oral care in recliner. Upper Body Bathing: Set up;Sitting   Lower Body Bathing: Minimal assistance;Set up;Sit to/from stand   Upper Body Dressing : Set up;Sitting   Lower Body Dressing: Moderate assistance;Sit to/from stand Lower Body Dressing Details (indicate cue type and reason): Able to donn left sock but not right. Toilet Transfer: Minimal assistance;BSC;RW;Cueing for Office manager Details (indicate cue type and reason): min assist to transfer to [facility] with verbal cues and assistance with extending RLE with transfer. Toileting- Clothing Manipulation and Hygiene: Minimal assistance;Sit to/from stand Toileting - Clothing Manipulation Details (indicate cue type and reason): Min assist for assisting with hygiene - patient able to wipe herself - therapist assisted with quality of task.             Vision Patient Visual Report: No change from  baseline Vision Assessment?: No apparent visual deficits     Perception     Praxis      Pertinent Vitals/Pain Pain Assessment: 0-10 Pain Score: 6  Pain Location: right knee Pain Descriptors / Indicators: Aching;Grimacing;Sore Pain Intervention(s): Limited activity within  patient's tolerance     Hand Dominance     Extremity/Trunk Assessment Upper Extremity Assessment Upper Extremity Assessment: Overall WFL for tasks assessed (Bilateral shoulders 3-/5, elbow, tricep, wrist and grip 5/5 strength with MMT. But poor strength and endurance with functional tasks especially with chair push ups and use of walker.\)           Communication Communication Communication: No difficulties   Cognition Arousal/Alertness: Awake/alert Behavior During Therapy: WFL for tasks assessed/performed;Anxious Overall Cognitive Status: Within Functional Limits for tasks assessed                                     General Comments       Exercises     Shoulder Instructions      Home Living Family/patient expects to be discharged to:: Private residence Living Arrangements: Spouse/significant other   Type of Home: House Home Access: Stairs to enter Technical brewer of Steps: 3 Entrance Stairs-Rails: Right Home Layout: One level               Home Equipment: Walker - 2 wheels          Prior Functioning/Environment Level of Independence: Independent with assistive device(s)                 OT Problem List: Decreased strength;Decreased activity tolerance;Impaired balance (sitting and/or standing);Decreased knowledge of use of DME or AE;Obesity;Pain      OT Treatment/Interventions: Self-care/ADL training;Therapeutic exercise;DME and/or AE instruction;Patient/family education;Therapeutic activities    OT Goals(Current goals can be found in the care plan section) Acute Rehab OT Goals Patient Stated Goal: to get stronger OT Goal Formulation: With patient Time For Goal Achievement: 09/26/19 Potential to Achieve Goals: Good  OT Frequency: Min 2X/week   Barriers to D/C:            Co-evaluation              AM-PAC OT "6 Clicks" Daily Activity     Outcome Measure Help from another person eating meals?: None Help from  another person taking care of personal grooming?: A Little Help from another person toileting, which includes using toliet, bedpan, or urinal?: A Little Help from another person bathing (including washing, rinsing, drying)?: A Little Help from another person to put on and taking off regular upper body clothing?: A Little Help from another person to put on and taking off regular lower body clothing?: A Lot 6 Click Score: 18   End of Session Equipment Utilized During Treatment: Gait belt;Rolling walker Nurse Communication: Mobility status  Activity Tolerance: Patient limited by pain;Patient limited by fatigue Patient left: in chair;with call bell/phone within reach;with chair alarm set  OT Visit Diagnosis: Unsteadiness on feet (R26.81);Other abnormalities of gait and mobility (R26.89);Muscle weakness (generalized) (M62.81);Other (comment)                Time: 1443-1540 OT Time Calculation (min): 38 min Charges:  OT General Charges $OT Visit: 1 Visit OT Evaluation $OT Eval Low Complexity: 1 Low OT Treatments $Self Care/Home Management : 23-37 mins  Celie Desrochers, OTR/L [place]  Office [phone] Pager: [phone]   Derl Barrow  L Nyeisha Goodall 09/12/2019, 12:45 PM

## 2019-09-12 NOTE — Plan of Care (Signed)
  Problem: Education: Goal: Knowledge of General Education information will improve Description: Including pain rating scale, medication(s)/side effects and non-pharmacologic comfort measures Outcome: Adequate for Discharge   Problem: Health Behavior/Discharge Planning: Goal: Ability to manage health-related needs will improve Outcome: Adequate for Discharge   Problem: Clinical Measurements: Goal: Ability to maintain clinical measurements within normal limits will improve Outcome: Adequate for Discharge Goal: Will remain free from infection Outcome: Adequate for Discharge Goal: Diagnostic test results will improve Outcome: Adequate for Discharge Goal: Respiratory complications will improve Outcome: Adequate for Discharge Goal: Cardiovascular complication will be avoided Outcome: Adequate for Discharge   Problem: Activity: Goal: Risk for activity intolerance will decrease Outcome: Adequate for Discharge   Problem: Nutrition: Goal: Adequate nutrition will be maintained Outcome: Adequate for Discharge   Problem: Coping: Goal: Level of anxiety will decrease Outcome: Adequate for Discharge   Problem: Elimination: Goal: Will not experience complications related to bowel motility Outcome: Adequate for Discharge Goal: Will not experience complications related to urinary retention Outcome: Adequate for Discharge   Problem: Pain Managment: Goal: General experience of comfort will improve Outcome: Adequate for Discharge   Problem: Safety: Goal: Ability to remain free from injury will improve Outcome: Adequate for Discharge   Problem: Skin Integrity: Goal: Risk for impaired skin integrity will decrease Outcome: Adequate for Discharge   Problem: Education: Goal: Knowledge of the prescribed therapeutic regimen will improve Outcome: Adequate for Discharge Goal: Individualized Educational Video(s) Outcome: Adequate for Discharge   Problem: Activity: Goal: Ability to avoid  complications of mobility impairment will improve Outcome: Adequate for Discharge Goal: Range of joint motion will improve Outcome: Adequate for Discharge   Problem: Clinical Measurements: Goal: Postoperative complications will be avoided or minimized Outcome: Adequate for Discharge   Problem: Pain Management: Goal: Pain level will decrease with appropriate interventions Outcome: Adequate for Discharge   Problem: Skin Integrity: Goal: Will show signs of wound healing Outcome: Adequate for Discharge   Problem: Acute Rehab PT Goals(only PT should resolve) Goal: Pt Will Go Supine/Side To Sit Outcome: Adequate for Discharge Goal: Patient Will Transfer Sit To/From Stand Outcome: Adequate for Discharge Goal: Pt Will Ambulate Outcome: Adequate for Discharge Goal: Pt Will Go Up/Down Stairs Outcome: Adequate for Discharge   Problem: Acute Rehab OT Goals (only OT should resolve) Goal: Pt. Will Perform Lower Body Dressing Outcome: Adequate for Discharge Goal: Pt. Will Transfer To Toilet Outcome: Adequate for Discharge Goal: Pt. Will Perform Toileting-Clothing Manipulation Outcome: Adequate for Discharge Goal: Pt/Caregiver Will Perform Home Exercise Program Outcome: Adequate for Discharge Goal: OT Additional ADL Goal #1 Outcome: Adequate for Discharge   Discharge teaching done with pt.  Written instructions given.

## 2019-09-12 NOTE — Progress Notes (Signed)
Physical Therapy Treatment Patient Details Name: Bridget Cortez MRN: [id number] DOB: December 29, 1945 Today's Date: 09/12/2019    History of Present Illness Pt is a 74 year old female s/p right TKA.  PMH of asthma, chiari malformation, HTN, breast CA, PNA    PT Comments    Pt reports getting sleep last night.  Pt overall performing much better today.  Pt tolerated improved distance ambulating however still required seated rest break.  Will return this afternoon to practice steps and see if pt is able to d/c home.   Follow Up Recommendations  Follow surgeons recommendation for DC plan and follow-up therapies;Supervision/Assistance - 24 hour     Equipment Recommendations  3in1 (PT)    Recommendations for Other Services       Precautions / Restrictions Precautions Precautions: Fall;Knee Restrictions Weight Bearing Restrictions: No    Mobility  Bed Mobility Overal bed mobility: Needs Assistance Bed Mobility: Supine to Sit     Supine to sit: Min assist     General bed mobility comments: pt in recliner  Transfers Overall transfer level: Needs assistance Equipment used: Rolling walker (2 wheeled) Transfers: Sit to/from Stand Sit to Stand: Min assist Stand pivot transfers: Min assist       General transfer comment: verbal cues for positioning and weight shifting, assist with extending R LE for pain control  Ambulation/Gait Ambulation/Gait assistance: Min guard Gait Distance (Feet): 60 Feet (x2) Assistive device: Rolling walker (2 wheeled) Gait Pattern/deviations: Step-to pattern;Decreased stance time - right;Antalgic Gait velocity: decr   General Gait Details: verbal cues for sequence, RW positioning, posture; recliner following for safety; pt required one seated rest break due to UE fatigue; improved distance today   Stairs             Wheelchair Mobility    Modified Rankin (Stroke Patients Only)       Balance Overall balance assessment: Needs  assistance Sitting-balance support: No upper extremity supported;Feet supported Sitting balance-Leahy Scale: Good     Standing balance support: During functional activity Standing balance-Leahy Scale: Poor                              Cognition Arousal/Alertness: Awake/alert Behavior During Therapy: WFL for tasks assessed/performed;Anxious Overall Cognitive Status: Within Functional Limits for tasks assessed                                        Exercises      General Comments        Pertinent Vitals/Pain Pain Assessment: Faces Pain Score: 6  Faces Pain Scale: Hurts little more Pain Location: right knee Pain Descriptors / Indicators: Aching;Grimacing;Sore Pain Intervention(s): Repositioned;Monitored during session    Home Living Family/patient expects to be discharged to:: Private residence Living Arrangements: Spouse/significant other   Type of Home: House Home Access: Stairs to enter Entrance Stairs-Rails: Right Home Layout: One level Home Equipment: Environmental consultant - 2 wheels      Prior Function Level of Independence: Independent with assistive device(s)          PT Goals (current goals can now be found in the care plan section) Acute Rehab PT Goals Patient Stated Goal: to get stronger Progress towards PT goals: Progressing toward goals    Frequency    7X/week      PT Plan Current plan remains appropriate  Co-evaluation              AM-PAC PT "6 Clicks" Mobility   Outcome Measure  Help needed turning from your back to your side while in a flat bed without using bedrails?: A Little Help needed moving from lying on your back to sitting on the side of a flat bed without using bedrails?: A Little Help needed moving to and from a bed to a chair (including a wheelchair)?: A Little Help needed standing up from a chair using your arms (e.g., wheelchair or bedside chair)?: A Little Help needed to walk in hospital room?: A  Little Help needed climbing 3-5 steps with a railing? : A Lot 6 Click Score: 17    End of Session Equipment Utilized During Treatment: Gait belt Activity Tolerance: Patient tolerated treatment well Patient left: with call bell/phone within reach;in chair Nurse Communication: Mobility status PT Visit Diagnosis: Other abnormalities of gait and mobility (R26.89)     Time: 8412-8208 PT Time Calculation (min) (ACUTE ONLY): 29 min  Charges:  $Gait Training: 23-37 mins                     Jannette Spanner PT, DPT Acute Rehabilitation Services Pager: [phone] Office: [phone]  York Ram E 09/12/2019, 1:13 PM

## 2019-09-13 DIAGNOSIS — Z86010 Personal history of colonic polyps: Secondary | ICD-10-CM | POA: Diagnosis not present

## 2019-09-13 DIAGNOSIS — M8580 Other specified disorders of bone density and structure, unspecified site: Secondary | ICD-10-CM | POA: Diagnosis not present

## 2019-09-13 DIAGNOSIS — J45909 Unspecified asthma, uncomplicated: Secondary | ICD-10-CM | POA: Diagnosis not present

## 2019-09-13 DIAGNOSIS — Q0700 Arnold-Chiari syndrome without spina bifida or hydrocephalus: Secondary | ICD-10-CM | POA: Diagnosis not present

## 2019-09-13 DIAGNOSIS — Z853 Personal history of malignant neoplasm of breast: Secondary | ICD-10-CM | POA: Diagnosis not present

## 2019-09-13 DIAGNOSIS — I5030 Unspecified diastolic (congestive) heart failure: Secondary | ICD-10-CM | POA: Diagnosis not present

## 2019-09-13 DIAGNOSIS — K219 Gastro-esophageal reflux disease without esophagitis: Secondary | ICD-10-CM | POA: Diagnosis not present

## 2019-09-13 DIAGNOSIS — Z96651 Presence of right artificial knee joint: Secondary | ICD-10-CM | POA: Diagnosis not present

## 2019-09-13 DIAGNOSIS — F418 Other specified anxiety disorders: Secondary | ICD-10-CM | POA: Diagnosis not present

## 2019-09-13 DIAGNOSIS — Z471 Aftercare following joint replacement surgery: Secondary | ICD-10-CM | POA: Diagnosis not present

## 2019-09-13 DIAGNOSIS — Z87891 Personal history of nicotine dependence: Secondary | ICD-10-CM | POA: Diagnosis not present

## 2019-09-13 DIAGNOSIS — Z6838 Body mass index (BMI) 38.0-38.9, adult: Secondary | ICD-10-CM | POA: Diagnosis not present

## 2019-09-13 DIAGNOSIS — I110 Hypertensive heart disease with heart failure: Secondary | ICD-10-CM | POA: Diagnosis not present

## 2019-09-13 DIAGNOSIS — K5790 Diverticulosis of intestine, part unspecified, without perforation or abscess without bleeding: Secondary | ICD-10-CM | POA: Diagnosis not present

## 2019-09-13 DIAGNOSIS — Z86002 Personal history of in-situ neoplasm of other and unspecified genital organs: Secondary | ICD-10-CM | POA: Diagnosis not present

## 2019-09-15 DIAGNOSIS — F418 Other specified anxiety disorders: Secondary | ICD-10-CM | POA: Diagnosis not present

## 2019-09-15 DIAGNOSIS — Z471 Aftercare following joint replacement surgery: Secondary | ICD-10-CM | POA: Diagnosis not present

## 2019-09-15 DIAGNOSIS — I110 Hypertensive heart disease with heart failure: Secondary | ICD-10-CM | POA: Diagnosis not present

## 2019-09-15 DIAGNOSIS — K219 Gastro-esophageal reflux disease without esophagitis: Secondary | ICD-10-CM | POA: Diagnosis not present

## 2019-09-15 DIAGNOSIS — I5030 Unspecified diastolic (congestive) heart failure: Secondary | ICD-10-CM | POA: Diagnosis not present

## 2019-09-15 DIAGNOSIS — J45909 Unspecified asthma, uncomplicated: Secondary | ICD-10-CM | POA: Diagnosis not present

## 2019-09-16 DIAGNOSIS — F418 Other specified anxiety disorders: Secondary | ICD-10-CM | POA: Diagnosis not present

## 2019-09-16 DIAGNOSIS — J45909 Unspecified asthma, uncomplicated: Secondary | ICD-10-CM | POA: Diagnosis not present

## 2019-09-16 DIAGNOSIS — I5030 Unspecified diastolic (congestive) heart failure: Secondary | ICD-10-CM | POA: Diagnosis not present

## 2019-09-16 DIAGNOSIS — K219 Gastro-esophageal reflux disease without esophagitis: Secondary | ICD-10-CM | POA: Diagnosis not present

## 2019-09-16 DIAGNOSIS — I110 Hypertensive heart disease with heart failure: Secondary | ICD-10-CM | POA: Diagnosis not present

## 2019-09-16 DIAGNOSIS — Z471 Aftercare following joint replacement surgery: Secondary | ICD-10-CM | POA: Diagnosis not present

## 2019-09-17 DIAGNOSIS — I5030 Unspecified diastolic (congestive) heart failure: Secondary | ICD-10-CM | POA: Diagnosis not present

## 2019-09-17 DIAGNOSIS — F418 Other specified anxiety disorders: Secondary | ICD-10-CM | POA: Diagnosis not present

## 2019-09-17 DIAGNOSIS — Z9889 Other specified postprocedural states: Secondary | ICD-10-CM | POA: Diagnosis not present

## 2019-09-17 DIAGNOSIS — Z96651 Presence of right artificial knee joint: Secondary | ICD-10-CM | POA: Diagnosis not present

## 2019-09-17 DIAGNOSIS — J45909 Unspecified asthma, uncomplicated: Secondary | ICD-10-CM | POA: Diagnosis not present

## 2019-09-17 DIAGNOSIS — I110 Hypertensive heart disease with heart failure: Secondary | ICD-10-CM | POA: Diagnosis not present

## 2019-09-17 DIAGNOSIS — Z471 Aftercare following joint replacement surgery: Secondary | ICD-10-CM | POA: Diagnosis not present

## 2019-09-17 DIAGNOSIS — K219 Gastro-esophageal reflux disease without esophagitis: Secondary | ICD-10-CM | POA: Diagnosis not present

## 2019-09-18 DIAGNOSIS — F418 Other specified anxiety disorders: Secondary | ICD-10-CM | POA: Diagnosis not present

## 2019-09-18 DIAGNOSIS — Z471 Aftercare following joint replacement surgery: Secondary | ICD-10-CM | POA: Diagnosis not present

## 2019-09-18 DIAGNOSIS — J45909 Unspecified asthma, uncomplicated: Secondary | ICD-10-CM | POA: Diagnosis not present

## 2019-09-18 DIAGNOSIS — I110 Hypertensive heart disease with heart failure: Secondary | ICD-10-CM | POA: Diagnosis not present

## 2019-09-18 DIAGNOSIS — I5030 Unspecified diastolic (congestive) heart failure: Secondary | ICD-10-CM | POA: Diagnosis not present

## 2019-09-18 DIAGNOSIS — K219 Gastro-esophageal reflux disease without esophagitis: Secondary | ICD-10-CM | POA: Diagnosis not present

## 2019-09-19 DIAGNOSIS — J45909 Unspecified asthma, uncomplicated: Secondary | ICD-10-CM | POA: Diagnosis not present

## 2019-09-19 DIAGNOSIS — Z471 Aftercare following joint replacement surgery: Secondary | ICD-10-CM | POA: Diagnosis not present

## 2019-09-19 DIAGNOSIS — K219 Gastro-esophageal reflux disease without esophagitis: Secondary | ICD-10-CM | POA: Diagnosis not present

## 2019-09-19 DIAGNOSIS — I110 Hypertensive heart disease with heart failure: Secondary | ICD-10-CM | POA: Diagnosis not present

## 2019-09-19 DIAGNOSIS — I5030 Unspecified diastolic (congestive) heart failure: Secondary | ICD-10-CM | POA: Diagnosis not present

## 2019-09-19 DIAGNOSIS — F418 Other specified anxiety disorders: Secondary | ICD-10-CM | POA: Diagnosis not present

## 2019-09-20 DIAGNOSIS — I110 Hypertensive heart disease with heart failure: Secondary | ICD-10-CM | POA: Diagnosis not present

## 2019-09-20 DIAGNOSIS — J45909 Unspecified asthma, uncomplicated: Secondary | ICD-10-CM | POA: Diagnosis not present

## 2019-09-20 DIAGNOSIS — F418 Other specified anxiety disorders: Secondary | ICD-10-CM | POA: Diagnosis not present

## 2019-09-20 DIAGNOSIS — I5030 Unspecified diastolic (congestive) heart failure: Secondary | ICD-10-CM | POA: Diagnosis not present

## 2019-09-20 DIAGNOSIS — K219 Gastro-esophageal reflux disease without esophagitis: Secondary | ICD-10-CM | POA: Diagnosis not present

## 2019-09-20 DIAGNOSIS — Z471 Aftercare following joint replacement surgery: Secondary | ICD-10-CM | POA: Diagnosis not present

## 2019-09-22 DIAGNOSIS — K219 Gastro-esophageal reflux disease without esophagitis: Secondary | ICD-10-CM | POA: Diagnosis not present

## 2019-09-22 DIAGNOSIS — F418 Other specified anxiety disorders: Secondary | ICD-10-CM | POA: Diagnosis not present

## 2019-09-22 DIAGNOSIS — I110 Hypertensive heart disease with heart failure: Secondary | ICD-10-CM | POA: Diagnosis not present

## 2019-09-22 DIAGNOSIS — I5030 Unspecified diastolic (congestive) heart failure: Secondary | ICD-10-CM | POA: Diagnosis not present

## 2019-09-22 DIAGNOSIS — J45909 Unspecified asthma, uncomplicated: Secondary | ICD-10-CM | POA: Diagnosis not present

## 2019-09-22 DIAGNOSIS — Z471 Aftercare following joint replacement surgery: Secondary | ICD-10-CM | POA: Diagnosis not present

## 2019-09-23 DIAGNOSIS — Z471 Aftercare following joint replacement surgery: Secondary | ICD-10-CM | POA: Diagnosis not present

## 2019-09-23 DIAGNOSIS — F418 Other specified anxiety disorders: Secondary | ICD-10-CM | POA: Diagnosis not present

## 2019-09-23 DIAGNOSIS — J45909 Unspecified asthma, uncomplicated: Secondary | ICD-10-CM | POA: Diagnosis not present

## 2019-09-23 DIAGNOSIS — I110 Hypertensive heart disease with heart failure: Secondary | ICD-10-CM | POA: Diagnosis not present

## 2019-09-23 DIAGNOSIS — K219 Gastro-esophageal reflux disease without esophagitis: Secondary | ICD-10-CM | POA: Diagnosis not present

## 2019-09-23 DIAGNOSIS — I5030 Unspecified diastolic (congestive) heart failure: Secondary | ICD-10-CM | POA: Diagnosis not present

## 2019-09-25 DIAGNOSIS — F418 Other specified anxiety disorders: Secondary | ICD-10-CM | POA: Diagnosis not present

## 2019-09-25 DIAGNOSIS — I110 Hypertensive heart disease with heart failure: Secondary | ICD-10-CM | POA: Diagnosis not present

## 2019-09-25 DIAGNOSIS — J45909 Unspecified asthma, uncomplicated: Secondary | ICD-10-CM | POA: Diagnosis not present

## 2019-09-25 DIAGNOSIS — Z471 Aftercare following joint replacement surgery: Secondary | ICD-10-CM | POA: Diagnosis not present

## 2019-09-25 DIAGNOSIS — K219 Gastro-esophageal reflux disease without esophagitis: Secondary | ICD-10-CM | POA: Diagnosis not present

## 2019-09-25 DIAGNOSIS — I5030 Unspecified diastolic (congestive) heart failure: Secondary | ICD-10-CM | POA: Diagnosis not present

## 2019-09-26 DIAGNOSIS — I110 Hypertensive heart disease with heart failure: Secondary | ICD-10-CM | POA: Diagnosis not present

## 2019-09-26 DIAGNOSIS — K219 Gastro-esophageal reflux disease without esophagitis: Secondary | ICD-10-CM | POA: Diagnosis not present

## 2019-09-26 DIAGNOSIS — Z471 Aftercare following joint replacement surgery: Secondary | ICD-10-CM | POA: Diagnosis not present

## 2019-09-26 DIAGNOSIS — F418 Other specified anxiety disorders: Secondary | ICD-10-CM | POA: Diagnosis not present

## 2019-09-26 DIAGNOSIS — I5030 Unspecified diastolic (congestive) heart failure: Secondary | ICD-10-CM | POA: Diagnosis not present

## 2019-09-26 DIAGNOSIS — J45909 Unspecified asthma, uncomplicated: Secondary | ICD-10-CM | POA: Diagnosis not present

## 2019-09-29 DIAGNOSIS — Z471 Aftercare following joint replacement surgery: Secondary | ICD-10-CM | POA: Diagnosis not present

## 2019-09-29 DIAGNOSIS — I5030 Unspecified diastolic (congestive) heart failure: Secondary | ICD-10-CM | POA: Diagnosis not present

## 2019-09-29 DIAGNOSIS — F418 Other specified anxiety disorders: Secondary | ICD-10-CM | POA: Diagnosis not present

## 2019-09-29 DIAGNOSIS — K219 Gastro-esophageal reflux disease without esophagitis: Secondary | ICD-10-CM | POA: Diagnosis not present

## 2019-09-29 DIAGNOSIS — I110 Hypertensive heart disease with heart failure: Secondary | ICD-10-CM | POA: Diagnosis not present

## 2019-09-29 DIAGNOSIS — J45909 Unspecified asthma, uncomplicated: Secondary | ICD-10-CM | POA: Diagnosis not present

## 2019-09-30 DIAGNOSIS — I5030 Unspecified diastolic (congestive) heart failure: Secondary | ICD-10-CM | POA: Diagnosis not present

## 2019-09-30 DIAGNOSIS — J45909 Unspecified asthma, uncomplicated: Secondary | ICD-10-CM | POA: Diagnosis not present

## 2019-09-30 DIAGNOSIS — K219 Gastro-esophageal reflux disease without esophagitis: Secondary | ICD-10-CM | POA: Diagnosis not present

## 2019-09-30 DIAGNOSIS — I110 Hypertensive heart disease with heart failure: Secondary | ICD-10-CM | POA: Diagnosis not present

## 2019-09-30 DIAGNOSIS — Z471 Aftercare following joint replacement surgery: Secondary | ICD-10-CM | POA: Diagnosis not present

## 2019-09-30 DIAGNOSIS — F418 Other specified anxiety disorders: Secondary | ICD-10-CM | POA: Diagnosis not present

## 2019-10-01 DIAGNOSIS — K219 Gastro-esophageal reflux disease without esophagitis: Secondary | ICD-10-CM | POA: Diagnosis not present

## 2019-10-01 DIAGNOSIS — J45909 Unspecified asthma, uncomplicated: Secondary | ICD-10-CM | POA: Diagnosis not present

## 2019-10-01 DIAGNOSIS — Z471 Aftercare following joint replacement surgery: Secondary | ICD-10-CM | POA: Diagnosis not present

## 2019-10-01 DIAGNOSIS — I110 Hypertensive heart disease with heart failure: Secondary | ICD-10-CM | POA: Diagnosis not present

## 2019-10-01 DIAGNOSIS — I5030 Unspecified diastolic (congestive) heart failure: Secondary | ICD-10-CM | POA: Diagnosis not present

## 2019-10-01 DIAGNOSIS — F418 Other specified anxiety disorders: Secondary | ICD-10-CM | POA: Diagnosis not present

## 2019-10-03 DIAGNOSIS — Z471 Aftercare following joint replacement surgery: Secondary | ICD-10-CM | POA: Diagnosis not present

## 2019-10-03 DIAGNOSIS — K219 Gastro-esophageal reflux disease without esophagitis: Secondary | ICD-10-CM | POA: Diagnosis not present

## 2019-10-03 DIAGNOSIS — J45909 Unspecified asthma, uncomplicated: Secondary | ICD-10-CM | POA: Diagnosis not present

## 2019-10-03 DIAGNOSIS — F418 Other specified anxiety disorders: Secondary | ICD-10-CM | POA: Diagnosis not present

## 2019-10-03 DIAGNOSIS — I110 Hypertensive heart disease with heart failure: Secondary | ICD-10-CM | POA: Diagnosis not present

## 2019-10-03 DIAGNOSIS — I5030 Unspecified diastolic (congestive) heart failure: Secondary | ICD-10-CM | POA: Diagnosis not present

## 2019-10-06 DIAGNOSIS — Z96651 Presence of right artificial knee joint: Secondary | ICD-10-CM | POA: Diagnosis not present

## 2019-10-06 DIAGNOSIS — M25661 Stiffness of right knee, not elsewhere classified: Secondary | ICD-10-CM | POA: Diagnosis not present

## 2019-10-10 DIAGNOSIS — Z8701 Personal history of pneumonia (recurrent): Secondary | ICD-10-CM | POA: Diagnosis not present

## 2019-10-10 DIAGNOSIS — I5030 Unspecified diastolic (congestive) heart failure: Secondary | ICD-10-CM | POA: Diagnosis not present

## 2019-10-10 DIAGNOSIS — N905 Atrophy of vulva: Secondary | ICD-10-CM | POA: Diagnosis not present

## 2019-10-10 DIAGNOSIS — Z86002 Personal history of in-situ neoplasm of other and unspecified genital organs: Secondary | ICD-10-CM | POA: Diagnosis not present

## 2019-10-10 DIAGNOSIS — K219 Gastro-esophageal reflux disease without esophagitis: Secondary | ICD-10-CM | POA: Diagnosis not present

## 2019-10-10 DIAGNOSIS — I110 Hypertensive heart disease with heart failure: Secondary | ICD-10-CM | POA: Diagnosis not present

## 2019-10-10 DIAGNOSIS — I471 Supraventricular tachycardia: Secondary | ICD-10-CM | POA: Diagnosis not present

## 2019-10-10 DIAGNOSIS — Q0700 Arnold-Chiari syndrome without spina bifida or hydrocephalus: Secondary | ICD-10-CM | POA: Diagnosis not present

## 2019-10-10 DIAGNOSIS — N83209 Unspecified ovarian cyst, unspecified side: Secondary | ICD-10-CM | POA: Diagnosis not present

## 2019-10-10 DIAGNOSIS — J45909 Unspecified asthma, uncomplicated: Secondary | ICD-10-CM | POA: Diagnosis not present

## 2019-10-10 DIAGNOSIS — Z87891 Personal history of nicotine dependence: Secondary | ICD-10-CM | POA: Diagnosis not present

## 2019-10-10 DIAGNOSIS — M8580 Other specified disorders of bone density and structure, unspecified site: Secondary | ICD-10-CM | POA: Diagnosis not present

## 2019-10-10 DIAGNOSIS — Z853 Personal history of malignant neoplasm of breast: Secondary | ICD-10-CM | POA: Diagnosis not present

## 2019-10-10 DIAGNOSIS — Z86010 Personal history of colonic polyps: Secondary | ICD-10-CM | POA: Diagnosis not present

## 2019-10-10 DIAGNOSIS — Z96651 Presence of right artificial knee joint: Secondary | ICD-10-CM | POA: Diagnosis not present

## 2019-10-10 DIAGNOSIS — Z471 Aftercare following joint replacement surgery: Secondary | ICD-10-CM | POA: Diagnosis not present

## 2019-10-10 DIAGNOSIS — F419 Anxiety disorder, unspecified: Secondary | ICD-10-CM | POA: Diagnosis not present

## 2019-10-10 DIAGNOSIS — Z7982 Long term (current) use of aspirin: Secondary | ICD-10-CM | POA: Diagnosis not present

## 2019-10-13 DIAGNOSIS — Z471 Aftercare following joint replacement surgery: Secondary | ICD-10-CM | POA: Diagnosis not present

## 2019-10-13 DIAGNOSIS — I5030 Unspecified diastolic (congestive) heart failure: Secondary | ICD-10-CM | POA: Diagnosis not present

## 2019-10-13 DIAGNOSIS — K219 Gastro-esophageal reflux disease without esophagitis: Secondary | ICD-10-CM | POA: Diagnosis not present

## 2019-10-13 DIAGNOSIS — I471 Supraventricular tachycardia: Secondary | ICD-10-CM | POA: Diagnosis not present

## 2019-10-13 DIAGNOSIS — J45909 Unspecified asthma, uncomplicated: Secondary | ICD-10-CM | POA: Diagnosis not present

## 2019-10-13 DIAGNOSIS — I110 Hypertensive heart disease with heart failure: Secondary | ICD-10-CM | POA: Diagnosis not present

## 2019-10-14 ENCOUNTER — Other Ambulatory Visit: Payer: Self-pay | Admitting: Internal Medicine

## 2019-10-15 DIAGNOSIS — D72819 Decreased white blood cell count, unspecified: Secondary | ICD-10-CM | POA: Diagnosis not present

## 2019-10-15 DIAGNOSIS — K219 Gastro-esophageal reflux disease without esophagitis: Secondary | ICD-10-CM | POA: Diagnosis not present

## 2019-10-15 DIAGNOSIS — E782 Mixed hyperlipidemia: Secondary | ICD-10-CM | POA: Diagnosis not present

## 2019-10-15 DIAGNOSIS — M1990 Unspecified osteoarthritis, unspecified site: Secondary | ICD-10-CM | POA: Diagnosis not present

## 2019-10-15 DIAGNOSIS — I10 Essential (primary) hypertension: Secondary | ICD-10-CM | POA: Diagnosis not present

## 2019-10-15 DIAGNOSIS — Q078 Other specified congenital malformations of nervous system: Secondary | ICD-10-CM | POA: Diagnosis not present

## 2019-10-15 DIAGNOSIS — J309 Allergic rhinitis, unspecified: Secondary | ICD-10-CM | POA: Diagnosis not present

## 2019-10-15 DIAGNOSIS — I519 Heart disease, unspecified: Secondary | ICD-10-CM | POA: Diagnosis not present

## 2019-10-15 DIAGNOSIS — F419 Anxiety disorder, unspecified: Secondary | ICD-10-CM | POA: Diagnosis not present

## 2019-10-15 DIAGNOSIS — I4891 Unspecified atrial fibrillation: Secondary | ICD-10-CM | POA: Diagnosis not present

## 2019-10-16 DIAGNOSIS — I471 Supraventricular tachycardia: Secondary | ICD-10-CM | POA: Diagnosis not present

## 2019-10-16 DIAGNOSIS — I5030 Unspecified diastolic (congestive) heart failure: Secondary | ICD-10-CM | POA: Diagnosis not present

## 2019-10-16 DIAGNOSIS — Z471 Aftercare following joint replacement surgery: Secondary | ICD-10-CM | POA: Diagnosis not present

## 2019-10-16 DIAGNOSIS — K219 Gastro-esophageal reflux disease without esophagitis: Secondary | ICD-10-CM | POA: Diagnosis not present

## 2019-10-16 DIAGNOSIS — J45909 Unspecified asthma, uncomplicated: Secondary | ICD-10-CM | POA: Diagnosis not present

## 2019-10-16 DIAGNOSIS — I110 Hypertensive heart disease with heart failure: Secondary | ICD-10-CM | POA: Diagnosis not present

## 2019-10-17 DIAGNOSIS — J45909 Unspecified asthma, uncomplicated: Secondary | ICD-10-CM | POA: Diagnosis not present

## 2019-10-17 DIAGNOSIS — I5030 Unspecified diastolic (congestive) heart failure: Secondary | ICD-10-CM | POA: Diagnosis not present

## 2019-10-17 DIAGNOSIS — I110 Hypertensive heart disease with heart failure: Secondary | ICD-10-CM | POA: Diagnosis not present

## 2019-10-17 DIAGNOSIS — Z471 Aftercare following joint replacement surgery: Secondary | ICD-10-CM | POA: Diagnosis not present

## 2019-10-17 DIAGNOSIS — I471 Supraventricular tachycardia: Secondary | ICD-10-CM | POA: Diagnosis not present

## 2019-10-17 DIAGNOSIS — K219 Gastro-esophageal reflux disease without esophagitis: Secondary | ICD-10-CM | POA: Diagnosis not present

## 2019-10-20 DIAGNOSIS — I5030 Unspecified diastolic (congestive) heart failure: Secondary | ICD-10-CM | POA: Diagnosis not present

## 2019-10-20 DIAGNOSIS — Z471 Aftercare following joint replacement surgery: Secondary | ICD-10-CM | POA: Diagnosis not present

## 2019-10-20 DIAGNOSIS — K219 Gastro-esophageal reflux disease without esophagitis: Secondary | ICD-10-CM | POA: Diagnosis not present

## 2019-10-20 DIAGNOSIS — J45909 Unspecified asthma, uncomplicated: Secondary | ICD-10-CM | POA: Diagnosis not present

## 2019-10-20 DIAGNOSIS — I110 Hypertensive heart disease with heart failure: Secondary | ICD-10-CM | POA: Diagnosis not present

## 2019-10-20 DIAGNOSIS — I471 Supraventricular tachycardia: Secondary | ICD-10-CM | POA: Diagnosis not present

## 2019-10-23 DIAGNOSIS — Z471 Aftercare following joint replacement surgery: Secondary | ICD-10-CM | POA: Diagnosis not present

## 2019-10-23 DIAGNOSIS — K219 Gastro-esophageal reflux disease without esophagitis: Secondary | ICD-10-CM | POA: Diagnosis not present

## 2019-10-23 DIAGNOSIS — J45909 Unspecified asthma, uncomplicated: Secondary | ICD-10-CM | POA: Diagnosis not present

## 2019-10-23 DIAGNOSIS — I110 Hypertensive heart disease with heart failure: Secondary | ICD-10-CM | POA: Diagnosis not present

## 2019-10-23 DIAGNOSIS — I5030 Unspecified diastolic (congestive) heart failure: Secondary | ICD-10-CM | POA: Diagnosis not present

## 2019-10-23 DIAGNOSIS — I471 Supraventricular tachycardia: Secondary | ICD-10-CM | POA: Diagnosis not present

## 2019-10-27 ENCOUNTER — Other Ambulatory Visit: Payer: Self-pay | Admitting: Obstetrics and Gynecology

## 2019-10-27 ENCOUNTER — Ambulatory Visit (INDEPENDENT_AMBULATORY_CARE_PROVIDER_SITE_OTHER): Payer: Medicare Other | Admitting: Internal Medicine

## 2019-10-27 ENCOUNTER — Other Ambulatory Visit: Payer: Self-pay

## 2019-10-27 ENCOUNTER — Encounter: Payer: Self-pay | Admitting: Internal Medicine

## 2019-10-27 VITALS — BP 130/80 | HR 84 | Ht 68.0 in | Wt 235.4 lb

## 2019-10-27 DIAGNOSIS — I5032 Chronic diastolic (congestive) heart failure: Secondary | ICD-10-CM

## 2019-10-27 DIAGNOSIS — I471 Supraventricular tachycardia: Secondary | ICD-10-CM | POA: Diagnosis not present

## 2019-10-27 DIAGNOSIS — Z1231 Encounter for screening mammogram for malignant neoplasm of breast: Secondary | ICD-10-CM

## 2019-10-27 DIAGNOSIS — I472 Ventricular tachycardia: Secondary | ICD-10-CM

## 2019-10-27 DIAGNOSIS — R000 Tachycardia, unspecified: Secondary | ICD-10-CM

## 2019-10-27 NOTE — Patient Instructions (Signed)

## 2019-10-27 NOTE — Progress Notes (Signed)
Patient Care Team: [name], MD as PCP - General (Internal Medicine) Burnell Blanks, MD as PCP - Cardiology (Cardiology)   HPI  Bridget Cortez is a 74 y.o. female Seen in followup for [facility] which had ECG features of VT (concordance//aVR) but initiation and termination sequences suggestive of SVT--GT agreed more likely SVT  Rx with amlodipine>> metoprolol  Continues with occasional palpitations typically short duration 2-3 minutes.  Relieved with sitting down.  Underwent knee replacement 8/21 now with much less pain.  Swelling no and physical therapy is hard.  No chest pain.  Breathing is not the issue with her knee pain.  Some swelling on the left significant swelling on the right.      DATE TEST EF   8/16 Echo   55-65 %   7/19 Echo   55-60 %         Date Cr K Hgb  2/20 0.86 4.1 12.3  6/20  0.62 3.8   8/21 0.61 4.0 12.8        Past Medical History:  Diagnosis Date  . Abnormal Pap smear 2006   vaginal medicine according to patient  . Allergy   . Arthritis   . Asthma   . 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 Breast cancer ([place]) 1999   Left  . CAP (community acquired pneumonia) 08/06/2014  . Cataract    cataracts lt. eye    cataract removed.  . Chiari malformation   . Diverticulosis   . Dysrhythmia    SVT  . GERD (gastroesophageal reflux disease)   . H/O osteopenia    08/2006  . H/O varicella   . Heart murmur   . Hypertension   . Monilial vaginitis 07/2007  . Multiple allergies   . Ovarian cyst   . Personal history of colonic adenomas 03/13/2012  . Personal history of radiation therapy 1990  . Pneumonia 07/2014  . 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 Yeast infection     Past Surgical History:  Procedure Laterality Date  . ABDOMINAL HYSTERECTOMY    . ABDOMINAL SURGERY    . BREAST EXCISIONAL BIOPSY Right    benign  . BREAST LUMPECTOMY Left 1990  . BREAST SURGERY     Lumpectomy, left breast  . CATARACT  EXTRACTION Left 2018  . COLONOSCOPY    . COLONOSCOPY W/ BIOPSIES    . EYE SURGERY    . HERNIA REPAIR    . KNEE SURGERY     right  . lt. eye aneurysm surgery Left 2017  . MOUTH SURGERY  2019   rt. side gum surgery  Lt. done 3 years ago  . RADIOACTIVE SEED GUIDED EXCISIONAL BREAST BIOPSY Right 07/09/2018   Procedure: RADIOACTIVE SEED GUIDED EXCISIONAL RIGHT  BREAST BIOPSY;  Surgeon: Stark Abdou Stocks, MD;  Location: [facility];  Service: General;  Laterality: Right;  . TOTAL KNEE ARTHROPLASTY Right 09/09/2019   Procedure: RIGHT TOTAL KNEE ARTHROPLASTY;  Surgeon: Melrose Nakayama, MD;  Location: [facility];  Service: Orthopedics;  Laterality: Right;    Current Meds  Medication Sig  . acetaminophen (TYLENOL) 325 MG tablet Take 2 tablets (650 mg total) by mouth every 6 (six) hours as needed for mild pain, fever or headache (or Fever >/= 101).  Marland Kitchen albuterol (PROVENTIL) (5 MG/ML) 0.5% nebulizer solution Take 0.5 mLs (2.5 mg total) by nebulization every 6 (six) hours as needed for wheezing or shortness of breath.  Marland Kitchen amLODipine (NORVASC) 10 MG tablet Take  5 mg by mouth at bedtime.   Marland Kitchen aspirin EC 81 MG tablet Take 81 mg by mouth daily. Swallow whole.  Marland Kitchen BYSTOLIC 5 MG tablet Take 1 tablet by mouth once daily  . Calcium Carbonate-Vitamin D (CALTRATE 600+D PO) Take 1 tablet by mouth daily with lunch.   . cetirizine (ZYRTEC) 10 MG tablet Take 10 mg by mouth daily.  Marland Kitchen esomeprazole (NEXIUM) 40 MG capsule Take 40 mg by mouth 2 (two) times daily before a meal.   . fluticasone (FLONASE) 50 MCG/ACT nasal spray Place 1 spray into both nostrils at bedtime.   . Fluticasone-Salmeterol (ADVAIR) 250-50 MCG/DOSE AEPB Inhale 1 puff into the lungs every 12 (twelve) hours.  Marland Kitchen losartan (COZAAR) 100 MG tablet Take 100 mg by mouth daily.  . Multiple Vitamin (MULTIVITAMIN) tablet Take 1 tablet by mouth daily with lunch.   . potassium chloride SA (KLOR-CON) 20 MEQ tablet Take 60 mEq by mouth daily.  . rosuvastatin (CRESTOR) 5 MG tablet  Take 5 mg by mouth daily.  . simethicone (MYLICON) 010 MG chewable tablet Chew 125 mg by mouth every 6 (six) hours as needed for flatulence.  . vitamin C (ASCORBIC ACID) 500 MG tablet Take 500 mg by mouth daily.  . [DISCONTINUED] aspirin EC 81 MG tablet Take 1 tablet (81 mg total) by mouth 2 (two) times daily after a meal.  . [DISCONTINUED] HYDROcodone-acetaminophen (NORCO/VICODIN) 5-325 MG tablet Take 1-2 tablets by mouth every 6 (six) hours as needed for moderate pain or severe pain (post op pain).  . [DISCONTINUED] tiZANidine (ZANAFLEX) 4 MG tablet Take 1 tablet (4 mg total) by mouth every 6 (six) hours as needed for muscle spasms.    Allergies  Allergen Reactions  . Ephedrine Shortness Of Breath  . Cortisone Other (See Comments)    Severe muscle spasms      Review of Systems negative except from HPI and PMH  Physical Exam BP 130/80   Pulse 84   Ht 5\' 8"  (1.727 m)   Wt 235 lb 6.4 oz (106.8 kg)   BMI 35.79 kg/m  Well developed and Morbidly obese  in no acute distress HENT normal Neck supple   Clear Regular rate and rhythm, no murmurs or gallops Abd-soft with active BS No Clubbing cyanosis 2+ on R and 1 + L edema  Warm of R knee  Skin-warm and dry A & Oriented  Grossly normal sensory and motor function  ECG sinus @ 84 17/10/38    Assessment and  Plan  WCT probably SVT  HTN  HFpEF   Euvolemic continue current meds  No tachypalps

## 2019-10-28 ENCOUNTER — Telehealth: Payer: Self-pay | Admitting: Internal Medicine

## 2019-10-28 NOTE — Telephone Encounter (Signed)
Patient states the information on her after visit summary is different from what she remembers discussing in her appointment yesterday. She would like a call back to discuss.

## 2019-10-28 NOTE — Telephone Encounter (Signed)
Spoke with pt who has concerns because she noticed on her AVS from 10/27/2019 OV with Dr Caryl Comes it had a diagnosis of heart failure that she states she was not aware. Of.  Pt advised this was not a new diagnosis added yesterday by Dr Caryl Comes but rather an older diagnosis that has been investigated by performing an echo that shows pt's pumping function is normal at 55-60%.  Pt states she was advised by Dr Caryl Comes yesterday that her EKG looked good and everything was OK.  Pt reassured that from her visit yesterday from a cardiac standpoint she is stable and no changes were made.  She is to follow up with Dr Caryl Comes as scheduled.  Pt verbalizes understanding and thanked Therapist, sports for the call.

## 2019-11-06 DIAGNOSIS — Z96651 Presence of right artificial knee joint: Secondary | ICD-10-CM | POA: Diagnosis not present

## 2019-11-06 DIAGNOSIS — M25661 Stiffness of right knee, not elsewhere classified: Secondary | ICD-10-CM | POA: Diagnosis not present

## 2019-11-09 DIAGNOSIS — Z471 Aftercare following joint replacement surgery: Secondary | ICD-10-CM | POA: Diagnosis not present

## 2019-11-11 DIAGNOSIS — M25661 Stiffness of right knee, not elsewhere classified: Secondary | ICD-10-CM | POA: Diagnosis not present

## 2019-11-11 DIAGNOSIS — Z96651 Presence of right artificial knee joint: Secondary | ICD-10-CM | POA: Diagnosis not present

## 2019-11-13 DIAGNOSIS — S161XXA Strain of muscle, fascia and tendon at neck level, initial encounter: Secondary | ICD-10-CM | POA: Diagnosis not present

## 2019-11-14 DIAGNOSIS — Z96651 Presence of right artificial knee joint: Secondary | ICD-10-CM | POA: Diagnosis not present

## 2019-11-14 DIAGNOSIS — M25661 Stiffness of right knee, not elsewhere classified: Secondary | ICD-10-CM | POA: Diagnosis not present

## 2019-11-18 DIAGNOSIS — Z96651 Presence of right artificial knee joint: Secondary | ICD-10-CM | POA: Diagnosis not present

## 2019-11-18 DIAGNOSIS — M25661 Stiffness of right knee, not elsewhere classified: Secondary | ICD-10-CM | POA: Diagnosis not present

## 2019-11-20 DIAGNOSIS — G935 Compression of brain: Secondary | ICD-10-CM | POA: Diagnosis not present

## 2019-11-20 DIAGNOSIS — I10 Essential (primary) hypertension: Secondary | ICD-10-CM | POA: Diagnosis not present

## 2019-11-20 DIAGNOSIS — Z6834 Body mass index (BMI) 34.0-34.9, adult: Secondary | ICD-10-CM | POA: Diagnosis not present

## 2019-11-21 ENCOUNTER — Ambulatory Visit
Admission: RE | Admit: 2019-11-21 | Discharge: 2019-11-21 | Disposition: A | Discharge status: Home / Self Care | Payer: Medicare Other | Source: Ambulatory Visit | Attending: Obstetrics and Gynecology | Admitting: Obstetrics and Gynecology

## 2019-11-21 ENCOUNTER — Other Ambulatory Visit: Payer: Self-pay

## 2019-11-21 DIAGNOSIS — Z1231 Encounter for screening mammogram for malignant neoplasm of breast: Secondary | ICD-10-CM

## 2019-11-25 DIAGNOSIS — M25661 Stiffness of right knee, not elsewhere classified: Secondary | ICD-10-CM | POA: Diagnosis not present

## 2019-11-25 DIAGNOSIS — Z96651 Presence of right artificial knee joint: Secondary | ICD-10-CM | POA: Diagnosis not present

## 2019-12-01 DIAGNOSIS — M25661 Stiffness of right knee, not elsewhere classified: Secondary | ICD-10-CM | POA: Diagnosis not present

## 2019-12-01 DIAGNOSIS — Z96651 Presence of right artificial knee joint: Secondary | ICD-10-CM | POA: Diagnosis not present

## 2019-12-03 DIAGNOSIS — M25661 Stiffness of right knee, not elsewhere classified: Secondary | ICD-10-CM | POA: Diagnosis not present

## 2019-12-03 DIAGNOSIS — Z96651 Presence of right artificial knee joint: Secondary | ICD-10-CM | POA: Diagnosis not present

## 2019-12-05 DIAGNOSIS — Z471 Aftercare following joint replacement surgery: Secondary | ICD-10-CM | POA: Diagnosis not present

## 2019-12-05 DIAGNOSIS — Z96651 Presence of right artificial knee joint: Secondary | ICD-10-CM | POA: Diagnosis not present

## 2019-12-06 ENCOUNTER — Ambulatory Visit: Payer: Medicare Other | Attending: Internal Medicine

## 2019-12-06 ENCOUNTER — Other Ambulatory Visit: Payer: Self-pay

## 2019-12-06 DIAGNOSIS — Z23 Encounter for immunization: Secondary | ICD-10-CM

## 2019-12-06 NOTE — Progress Notes (Signed)
   Covid-19 Vaccination Clinic  Name:  Bridget Cortez    MRN: [id number] DOB: [birth year]  12/06/2019  [name] was observed post Covid-19 immunization for 15 minutes without incident. She was provided with Vaccine Information Sheet and instruction to access the V-Safe system.   [name] was instructed to call 911 with any severe reactions post vaccine: Marland Kitchen Difficulty breathing  . Swelling of face and throat  . A fast heartbeat  . A bad rash all over body  . Dizziness and weakness   Immunizations Administered    Name Date Dose VIS Date Route   Pfizer COVID-19 Vaccine 12/06/2019  1:04 PM 0.3 mL 11/12/2019 Intramuscular   Manufacturer: [place]   Lot: [id number]   [place]: [id number]

## 2019-12-09 DIAGNOSIS — Z96651 Presence of right artificial knee joint: Secondary | ICD-10-CM | POA: Diagnosis not present

## 2019-12-09 DIAGNOSIS — M25661 Stiffness of right knee, not elsewhere classified: Secondary | ICD-10-CM | POA: Diagnosis not present

## 2019-12-11 DIAGNOSIS — M25661 Stiffness of right knee, not elsewhere classified: Secondary | ICD-10-CM | POA: Diagnosis not present

## 2019-12-11 DIAGNOSIS — Z96651 Presence of right artificial knee joint: Secondary | ICD-10-CM | POA: Diagnosis not present

## 2019-12-12 DIAGNOSIS — H01115 Allergic dermatitis of left lower eyelid: Secondary | ICD-10-CM | POA: Diagnosis not present

## 2019-12-12 DIAGNOSIS — H01111 Allergic dermatitis of right upper eyelid: Secondary | ICD-10-CM | POA: Diagnosis not present

## 2019-12-12 DIAGNOSIS — H01114 Allergic dermatitis of left upper eyelid: Secondary | ICD-10-CM | POA: Diagnosis not present

## 2019-12-12 DIAGNOSIS — H01112 Allergic dermatitis of right lower eyelid: Secondary | ICD-10-CM | POA: Diagnosis not present

## 2019-12-17 DIAGNOSIS — M25661 Stiffness of right knee, not elsewhere classified: Secondary | ICD-10-CM | POA: Diagnosis not present

## 2019-12-17 DIAGNOSIS — Z96651 Presence of right artificial knee joint: Secondary | ICD-10-CM | POA: Diagnosis not present

## 2019-12-25 DIAGNOSIS — M25661 Stiffness of right knee, not elsewhere classified: Secondary | ICD-10-CM | POA: Diagnosis not present

## 2019-12-25 DIAGNOSIS — Z96651 Presence of right artificial knee joint: Secondary | ICD-10-CM | POA: Diagnosis not present

## 2019-12-30 DIAGNOSIS — Z96651 Presence of right artificial knee joint: Secondary | ICD-10-CM | POA: Diagnosis not present

## 2019-12-30 DIAGNOSIS — M25661 Stiffness of right knee, not elsewhere classified: Secondary | ICD-10-CM | POA: Diagnosis not present

## 2020-01-02 DIAGNOSIS — M25661 Stiffness of right knee, not elsewhere classified: Secondary | ICD-10-CM | POA: Diagnosis not present

## 2020-01-02 DIAGNOSIS — Z96651 Presence of right artificial knee joint: Secondary | ICD-10-CM | POA: Diagnosis not present

## 2020-01-06 DIAGNOSIS — M25661 Stiffness of right knee, not elsewhere classified: Secondary | ICD-10-CM | POA: Diagnosis not present

## 2020-01-06 DIAGNOSIS — Z96651 Presence of right artificial knee joint: Secondary | ICD-10-CM | POA: Diagnosis not present

## 2020-01-08 DIAGNOSIS — Z96651 Presence of right artificial knee joint: Secondary | ICD-10-CM | POA: Diagnosis not present

## 2020-01-08 DIAGNOSIS — M25661 Stiffness of right knee, not elsewhere classified: Secondary | ICD-10-CM | POA: Diagnosis not present

## 2020-01-12 DIAGNOSIS — M25661 Stiffness of right knee, not elsewhere classified: Secondary | ICD-10-CM | POA: Diagnosis not present

## 2020-01-12 DIAGNOSIS — Z96651 Presence of right artificial knee joint: Secondary | ICD-10-CM | POA: Diagnosis not present

## 2020-01-22 DIAGNOSIS — Z96651 Presence of right artificial knee joint: Secondary | ICD-10-CM | POA: Diagnosis not present

## 2020-01-22 DIAGNOSIS — M25661 Stiffness of right knee, not elsewhere classified: Secondary | ICD-10-CM | POA: Diagnosis not present

## 2020-01-27 DIAGNOSIS — Z96651 Presence of right artificial knee joint: Secondary | ICD-10-CM | POA: Diagnosis not present

## 2020-01-27 DIAGNOSIS — M25661 Stiffness of right knee, not elsewhere classified: Secondary | ICD-10-CM | POA: Diagnosis not present

## 2020-01-30 DIAGNOSIS — Z96651 Presence of right artificial knee joint: Secondary | ICD-10-CM | POA: Diagnosis not present

## 2020-01-30 DIAGNOSIS — M25661 Stiffness of right knee, not elsewhere classified: Secondary | ICD-10-CM | POA: Diagnosis not present

## 2020-02-03 DIAGNOSIS — Z96651 Presence of right artificial knee joint: Secondary | ICD-10-CM | POA: Diagnosis not present

## 2020-02-03 DIAGNOSIS — M25661 Stiffness of right knee, not elsewhere classified: Secondary | ICD-10-CM | POA: Diagnosis not present

## 2020-02-06 DIAGNOSIS — M25661 Stiffness of right knee, not elsewhere classified: Secondary | ICD-10-CM | POA: Diagnosis not present

## 2020-02-06 DIAGNOSIS — Z96651 Presence of right artificial knee joint: Secondary | ICD-10-CM | POA: Diagnosis not present

## 2020-02-10 DIAGNOSIS — L0293 Carbuncle, unspecified: Secondary | ICD-10-CM | POA: Diagnosis not present

## 2020-02-10 DIAGNOSIS — L02234 Carbuncle of groin: Secondary | ICD-10-CM | POA: Diagnosis not present

## 2020-02-12 DIAGNOSIS — Z96651 Presence of right artificial knee joint: Secondary | ICD-10-CM | POA: Diagnosis not present

## 2020-02-12 DIAGNOSIS — M25661 Stiffness of right knee, not elsewhere classified: Secondary | ICD-10-CM | POA: Diagnosis not present

## 2020-02-17 DIAGNOSIS — Z96651 Presence of right artificial knee joint: Secondary | ICD-10-CM | POA: Diagnosis not present

## 2020-02-17 DIAGNOSIS — M25661 Stiffness of right knee, not elsewhere classified: Secondary | ICD-10-CM | POA: Diagnosis not present

## 2020-02-19 DIAGNOSIS — M25661 Stiffness of right knee, not elsewhere classified: Secondary | ICD-10-CM | POA: Diagnosis not present

## 2020-02-19 DIAGNOSIS — Z96651 Presence of right artificial knee joint: Secondary | ICD-10-CM | POA: Diagnosis not present

## 2020-02-24 DIAGNOSIS — D2371 Other benign neoplasm of skin of right lower limb, including hip: Secondary | ICD-10-CM | POA: Diagnosis not present

## 2020-02-24 DIAGNOSIS — L304 Erythema intertrigo: Secondary | ICD-10-CM | POA: Diagnosis not present

## 2020-02-24 DIAGNOSIS — L821 Other seborrheic keratosis: Secondary | ICD-10-CM | POA: Diagnosis not present

## 2020-02-25 DIAGNOSIS — M25661 Stiffness of right knee, not elsewhere classified: Secondary | ICD-10-CM | POA: Diagnosis not present

## 2020-02-25 DIAGNOSIS — Z96651 Presence of right artificial knee joint: Secondary | ICD-10-CM | POA: Diagnosis not present

## 2020-02-27 DIAGNOSIS — M25661 Stiffness of right knee, not elsewhere classified: Secondary | ICD-10-CM | POA: Diagnosis not present

## 2020-02-27 DIAGNOSIS — Z96651 Presence of right artificial knee joint: Secondary | ICD-10-CM | POA: Diagnosis not present

## 2020-03-02 DIAGNOSIS — Z96651 Presence of right artificial knee joint: Secondary | ICD-10-CM | POA: Diagnosis not present

## 2020-03-02 DIAGNOSIS — M25661 Stiffness of right knee, not elsewhere classified: Secondary | ICD-10-CM | POA: Diagnosis not present

## 2020-03-05 DIAGNOSIS — Z471 Aftercare following joint replacement surgery: Secondary | ICD-10-CM | POA: Diagnosis not present

## 2020-03-05 DIAGNOSIS — M25661 Stiffness of right knee, not elsewhere classified: Secondary | ICD-10-CM | POA: Diagnosis not present

## 2020-03-05 DIAGNOSIS — Z96651 Presence of right artificial knee joint: Secondary | ICD-10-CM | POA: Diagnosis not present

## 2020-03-08 DIAGNOSIS — Z96651 Presence of right artificial knee joint: Secondary | ICD-10-CM | POA: Diagnosis not present

## 2020-03-08 DIAGNOSIS — M25661 Stiffness of right knee, not elsewhere classified: Secondary | ICD-10-CM | POA: Diagnosis not present

## 2020-03-09 DIAGNOSIS — H40023 Open angle with borderline findings, high risk, bilateral: Secondary | ICD-10-CM | POA: Diagnosis not present

## 2020-03-09 DIAGNOSIS — H2511 Age-related nuclear cataract, right eye: Secondary | ICD-10-CM | POA: Diagnosis not present

## 2020-03-09 DIAGNOSIS — H25011 Cortical age-related cataract, right eye: Secondary | ICD-10-CM | POA: Diagnosis not present

## 2020-03-09 DIAGNOSIS — H26492 Other secondary cataract, left eye: Secondary | ICD-10-CM | POA: Diagnosis not present

## 2020-03-11 DIAGNOSIS — Z01419 Encounter for gynecological examination (general) (routine) without abnormal findings: Secondary | ICD-10-CM | POA: Diagnosis not present

## 2020-03-11 DIAGNOSIS — Z6837 Body mass index (BMI) 37.0-37.9, adult: Secondary | ICD-10-CM | POA: Diagnosis not present

## 2020-03-12 DIAGNOSIS — Z96651 Presence of right artificial knee joint: Secondary | ICD-10-CM | POA: Diagnosis not present

## 2020-03-12 DIAGNOSIS — M25661 Stiffness of right knee, not elsewhere classified: Secondary | ICD-10-CM | POA: Diagnosis not present

## 2020-03-15 DIAGNOSIS — M25661 Stiffness of right knee, not elsewhere classified: Secondary | ICD-10-CM | POA: Diagnosis not present

## 2020-03-15 DIAGNOSIS — Z96651 Presence of right artificial knee joint: Secondary | ICD-10-CM | POA: Diagnosis not present

## 2020-03-22 DIAGNOSIS — Z96651 Presence of right artificial knee joint: Secondary | ICD-10-CM | POA: Diagnosis not present

## 2020-03-22 DIAGNOSIS — M25661 Stiffness of right knee, not elsewhere classified: Secondary | ICD-10-CM | POA: Diagnosis not present

## 2020-03-30 DIAGNOSIS — Z96651 Presence of right artificial knee joint: Secondary | ICD-10-CM | POA: Diagnosis not present

## 2020-03-30 DIAGNOSIS — M25661 Stiffness of right knee, not elsewhere classified: Secondary | ICD-10-CM | POA: Diagnosis not present

## 2020-04-01 DIAGNOSIS — Z96651 Presence of right artificial knee joint: Secondary | ICD-10-CM | POA: Diagnosis not present

## 2020-04-01 DIAGNOSIS — M25661 Stiffness of right knee, not elsewhere classified: Secondary | ICD-10-CM | POA: Diagnosis not present

## 2020-04-07 DIAGNOSIS — M25661 Stiffness of right knee, not elsewhere classified: Secondary | ICD-10-CM | POA: Diagnosis not present

## 2020-04-07 DIAGNOSIS — Z96651 Presence of right artificial knee joint: Secondary | ICD-10-CM | POA: Diagnosis not present

## 2020-04-09 DIAGNOSIS — M25661 Stiffness of right knee, not elsewhere classified: Secondary | ICD-10-CM | POA: Diagnosis not present

## 2020-04-09 DIAGNOSIS — Z96651 Presence of right artificial knee joint: Secondary | ICD-10-CM | POA: Diagnosis not present

## 2020-04-11 ENCOUNTER — Other Ambulatory Visit: Payer: Self-pay | Admitting: Internal Medicine

## 2020-04-13 DIAGNOSIS — Z96651 Presence of right artificial knee joint: Secondary | ICD-10-CM | POA: Diagnosis not present

## 2020-04-13 DIAGNOSIS — M25661 Stiffness of right knee, not elsewhere classified: Secondary | ICD-10-CM | POA: Diagnosis not present

## 2020-04-15 DIAGNOSIS — Z96651 Presence of right artificial knee joint: Secondary | ICD-10-CM | POA: Diagnosis not present

## 2020-04-15 DIAGNOSIS — M25661 Stiffness of right knee, not elsewhere classified: Secondary | ICD-10-CM | POA: Diagnosis not present

## 2020-04-17 ENCOUNTER — Emergency Department (HOSPITAL_COMMUNITY): Payer: Medicare Other

## 2020-04-17 ENCOUNTER — Emergency Department (HOSPITAL_COMMUNITY)
Admission: EM | Admit: 2020-04-17 | Discharge: 2020-04-17 | Disposition: A | Discharge status: Home / Self Care | Payer: Medicare Other | Attending: Emergency Medicine | Admitting: Emergency Medicine

## 2020-04-17 ENCOUNTER — Encounter (HOSPITAL_COMMUNITY): Payer: Self-pay

## 2020-04-17 ENCOUNTER — Other Ambulatory Visit: Payer: Self-pay

## 2020-04-17 DIAGNOSIS — K219 Gastro-esophageal reflux disease without esophagitis: Secondary | ICD-10-CM | POA: Diagnosis not present

## 2020-04-17 DIAGNOSIS — Z87891 Personal history of nicotine dependence: Secondary | ICD-10-CM | POA: Insufficient documentation

## 2020-04-17 DIAGNOSIS — M5459 Other low back pain: Secondary | ICD-10-CM | POA: Diagnosis not present

## 2020-04-17 DIAGNOSIS — Z853 Personal history of malignant neoplasm of breast: Secondary | ICD-10-CM | POA: Diagnosis not present

## 2020-04-17 DIAGNOSIS — R1030 Lower abdominal pain, unspecified: Secondary | ICD-10-CM | POA: Diagnosis not present

## 2020-04-17 DIAGNOSIS — K7689 Other specified diseases of liver: Secondary | ICD-10-CM | POA: Diagnosis not present

## 2020-04-17 DIAGNOSIS — Z7982 Long term (current) use of aspirin: Secondary | ICD-10-CM | POA: Insufficient documentation

## 2020-04-17 DIAGNOSIS — M545 Low back pain, unspecified: Secondary | ICD-10-CM | POA: Insufficient documentation

## 2020-04-17 DIAGNOSIS — I318 Other specified diseases of pericardium: Secondary | ICD-10-CM | POA: Diagnosis not present

## 2020-04-17 DIAGNOSIS — J45909 Unspecified asthma, uncomplicated: Secondary | ICD-10-CM | POA: Diagnosis not present

## 2020-04-17 DIAGNOSIS — I10 Essential (primary) hypertension: Secondary | ICD-10-CM | POA: Diagnosis not present

## 2020-04-17 DIAGNOSIS — R350 Frequency of micturition: Secondary | ICD-10-CM | POA: Diagnosis not present

## 2020-04-17 DIAGNOSIS — Z79899 Other long term (current) drug therapy: Secondary | ICD-10-CM | POA: Insufficient documentation

## 2020-04-17 DIAGNOSIS — D7389 Other diseases of spleen: Secondary | ICD-10-CM | POA: Diagnosis not present

## 2020-04-17 DIAGNOSIS — Z96651 Presence of right artificial knee joint: Secondary | ICD-10-CM | POA: Diagnosis not present

## 2020-04-17 DIAGNOSIS — Z7951 Long term (current) use of inhaled steroids: Secondary | ICD-10-CM | POA: Diagnosis not present

## 2020-04-17 DIAGNOSIS — K5730 Diverticulosis of large intestine without perforation or abscess without bleeding: Secondary | ICD-10-CM | POA: Diagnosis not present

## 2020-04-17 LAB — BASIC METABOLIC PANEL
Anion gap: 9 (ref 5–15)
BUN: 10 mg/dL (ref 8–23)
CO2: 25 mmol/L (ref 22–32)
Calcium: 9.4 mg/dL (ref 8.9–10.3)
Chloride: 100 mmol/L (ref 98–111)
Creatinine, Ser: 0.54 mg/dL (ref 0.44–1.00)
GFR, Estimated: >60 mL/min (ref >60)
Glucose, Bld: 103 mg/dL — ABNORMAL HIGH (ref 70–99)
Potassium: 3.7 mmol/L (ref 3.5–5.1)
Sodium: 134 mmol/L — ABNORMAL LOW (ref 135–145)

## 2020-04-17 LAB — URINALYSIS, ROUTINE W REFLEX MICROSCOPIC
Bilirubin Urine: NEGATIVE
Glucose, UA: NEGATIVE mg/dL
Hgb urine dipstick: NEGATIVE
Ketones, ur: NEGATIVE mg/dL
Leukocytes,Ua: NEGATIVE
Nitrite: NEGATIVE
Protein, ur: NEGATIVE mg/dL
Specific Gravity, Urine: 1.004 — ABNORMAL LOW (ref 1.005–1.030)
pH: 7 (ref 5.0–8.0)

## 2020-04-17 LAB — CBC
HCT: 39.4 % (ref 36.0–46.0)
Hemoglobin: 12.7 g/dL (ref 12.0–15.0)
MCH: 28.3 pg (ref 26.0–34.0)
MCHC: 32.2 g/dL (ref 30.0–36.0)
MCV: 87.8 fL (ref 80.0–100.0)
Platelets: 221 10*3/uL (ref 150–400)
RBC: 4.49 MIL/uL (ref 3.87–5.11)
RDW: 15.5 % (ref 11.5–15.5)
WBC: 2.9 10*3/uL — ABNORMAL LOW (ref 4.0–10.5)
nRBC: 0 % (ref 0.0–0.2)

## 2020-04-17 MED ORDER — CYCLOBENZAPRINE HCL 10 MG PO TABS: 10.0000 mg | ORAL_TABLET | Freq: Two times a day (BID) | ORAL | 0 refills | Status: DC | PRN

## 2020-04-17 NOTE — ED Provider Notes (Signed)
[facility] EMERGENCY DEPARTMENT Provider Note  CSN: [id number] Arrival date & time: 04/17/20 0532    History Chief Complaint  Patient presents with  . Back Spasm    HPI  Bridget Cortez is a 75 y.o. female presents for evaluation of left lower back pain. She recently had a R knee replacement and is doing physical therapy. The last 3 days she has noticed increased urinary frequency and L lower abdominal pain, particularly after urination. She began drinking lots of water, concerned she may have a UTI and noticed some occasional overflow incontinence but no urinary retention or fecal incontinence. This morning around 3am she got up to her bedside commode to urinate and when she was trying to get up, she had sudden onset of sharp stabbing pain in L lower back felt like a muscle spasm. She was able to get up and get to her husband who gave her some tylenol. She has not had any pain radiating into her buttock and leg. No numbness or weakness in LE. No prior history of back problems.    Past Medical History:  Diagnosis Date  . Abnormal Pap smear 2006   vaginal medicine according to patient  . Allergy   . Arthritis   . Asthma   . 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 Breast cancer ([place]) 1999   Left  . CAP (community acquired pneumonia) 08/06/2014  . Cataract    cataracts lt. eye    cataract removed.  . Chiari malformation   . Diverticulosis   . Dysrhythmia    SVT  . GERD (gastroesophageal reflux disease)   . H/O osteopenia    08/2006  . H/O varicella   . Heart murmur   . Hypertension   . Monilial vaginitis 07/2007  . Multiple allergies   . Ovarian cyst   . Personal history of colonic adenomas 03/13/2012  . Personal history of radiation therapy 1990  . Pneumonia 07/2014  . 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 Yeast infection     Past Surgical History:  Procedure Laterality Date  . ABDOMINAL HYSTERECTOMY    . ABDOMINAL SURGERY    . BREAST EXCISIONAL  BIOPSY Right    benign  . BREAST LUMPECTOMY Left 1990  . BREAST SURGERY     Lumpectomy, left breast  . CATARACT EXTRACTION Left 2018  . COLONOSCOPY    . COLONOSCOPY W/ BIOPSIES    . EYE SURGERY    . HERNIA REPAIR    . KNEE SURGERY     right  . lt. eye aneurysm surgery Left 2017  . MOUTH SURGERY  2019   rt. side gum surgery  Lt. done 3 years ago  . RADIOACTIVE SEED GUIDED EXCISIONAL BREAST BIOPSY Right 07/09/2018   Procedure: RADIOACTIVE SEED GUIDED EXCISIONAL RIGHT  BREAST BIOPSY;  Surgeon: Stark Klein, MD;  Location: [facility];  Service: General;  Laterality: Right;  . TOTAL KNEE ARTHROPLASTY Right 09/09/2019   Procedure: RIGHT TOTAL KNEE ARTHROPLASTY;  Surgeon: Melrose Nakayama, MD;  Location: [facility];  Service: Orthopedics;  Laterality: Right;    Family History  Problem Relation Age of Onset  . Cancer Mother   . Heart failure Other   . Colon cancer Neg Hx   . Esophageal cancer Neg Hx   . Rectal cancer Neg Hx   . Stomach cancer Neg Hx   . Colon polyps Neg Hx     Social History   Tobacco Use  . Smoking status: Former Smoker  Quit date: 01/23/1978    Years since quitting: 42.2  . Smokeless tobacco: Never Used  Vaping Use  . Vaping Use: Never used  Substance Use Topics  . Alcohol use: No  . Drug use: No     Home Medications Prior to Admission medications   Medication Sig Start Date End Date Taking? Authorizing Provider  cyclobenzaprine (FLEXERIL) 10 MG tablet Take 1 tablet (10 mg total) by mouth 2 (two) times daily as needed for muscle spasms. 04/17/20  Yes Truddie Hidden, MD  acetaminophen (TYLENOL) 325 MG tablet Take 2 tablets (650 mg total) by mouth every 6 (six) hours as needed for mild pain, fever or headache (or Fever >/= 101). 08/11/14   Hongalgi, Lenis Dickinson, MD  albuterol (PROVENTIL) (5 MG/ML) 0.5% nebulizer solution Take 0.5 mLs (2.5 mg total) by nebulization every 6 (six) hours as needed for wheezing or shortness of breath. 02/23/18   Curatolo, Adam, DO   amLODipine (NORVASC) 10 MG tablet Take 5 mg by mouth at bedtime.     [provider]  aspirin EC 81 MG tablet Take 81 mg by mouth daily. Swallow whole.    [provider]  Calcium Carbonate-Vitamin D (CALTRATE 600+D PO) Take 1 tablet by mouth daily with lunch.     [provider]  cetirizine (ZYRTEC) 10 MG tablet Take 10 mg by mouth daily.    [provider]  esomeprazole (NEXIUM) 40 MG capsule Take 40 mg by mouth 2 (two) times daily before a meal.     [provider]  fluticasone (FLONASE) 50 MCG/ACT nasal spray Place 1 spray into both nostrils at bedtime.  03/16/12   [provider]  Fluticasone-Salmeterol (ADVAIR) 250-50 MCG/DOSE AEPB Inhale 1 puff into the lungs every 12 (twelve) hours.    [provider]  losartan (COZAAR) 100 MG tablet Take 100 mg by mouth daily.    [provider]  Multiple Vitamin (MULTIVITAMIN) tablet Take 1 tablet by mouth daily with lunch.     [provider]  nebivolol (BYSTOLIC) 5 MG tablet Take 1 tablet by mouth once daily 04/12/20   Deboraha Sprang, MD  potassium chloride SA (KLOR-CON) 20 MEQ tablet Take 60 mEq by mouth daily.    [provider]  rosuvastatin (CRESTOR) 5 MG tablet Take 5 mg by mouth daily.    [provider]  simethicone (MYLICON) 621 MG chewable tablet Chew 125 mg by mouth every 6 (six) hours as needed for flatulence.    [provider]  vitamin C (ASCORBIC ACID) 500 MG tablet Take 500 mg by mouth daily.    [provider]     Allergies    Ephedrine and Cortisone   Review of Systems   Review of Systems A comprehensive review of systems was completed and negative except as noted in HPI.    Physical Exam BP (!) 156/82   Pulse 66   Temp 98.3 F (36.8 C) (Oral)   Resp 17   Ht 5\' 8"  (1.727 m) Comment: Simultaneous filing. User may not have seen previous data.  Wt 106.1 kg Comment: Simultaneous filing. User may not have seen  previous data.  SpO2 100%   BMI 35.58 kg/m   Physical Exam Vitals and nursing note reviewed.  Constitutional:      Appearance: Normal appearance.  HENT:     Head: Normocephalic and atraumatic.     Nose: Nose normal.     Mouth/Throat:     Mouth: Mucous membranes  are moist.  Eyes:     Extraocular Movements: Extraocular movements intact.     Conjunctiva/sclera: Conjunctivae normal.  Cardiovascular:     Rate and Rhythm: Normal rate.  Pulmonary:     Effort: Pulmonary effort is normal.     Breath sounds: Normal breath sounds.  Abdominal:     General: Abdomen is flat.     Palpations: Abdomen is soft. There is no mass.     Tenderness: There is abdominal tenderness (LLQ, mild). There is no guarding or rebound.  Musculoskeletal:        General: Tenderness (mild L lumbar paraspinal muscles, no midline spine tenderness) present. No swelling. Normal range of motion.     Cervical back: Neck supple.  Skin:    General: Skin is warm and dry.  Neurological:     General: No focal deficit present.     Mental Status: She is alert.     Cranial Nerves: No cranial nerve deficit.     Sensory: No sensory deficit.     Motor: No weakness.  Psychiatric:        Mood and Affect: Mood normal.      ED Results / Procedures / Treatments   Labs (all labs ordered are listed, but only abnormal results are displayed) Labs Reviewed  URINALYSIS, ROUTINE W REFLEX MICROSCOPIC - Abnormal; Notable for the following components:      Result Value   Color, Urine COLORLESS (*)    Specific Gravity, Urine 1.004 (*)    All other components within normal limits  BASIC METABOLIC PANEL - Abnormal; Notable for the following components:   Sodium 134 (*)    Glucose, Bld 103 (*)    All other components within normal limits  CBC - Abnormal; Notable for the following components:   WBC 2.9 (*)    All other components within normal limits    EKG None  Radiology CT Renal Stone Study  Result Date:  04/17/2020 CLINICAL DATA:  Left-sided flank pain for 3 days EXAM: CT ABDOMEN AND PELVIS WITHOUT CONTRAST TECHNIQUE: Multidetector CT imaging of the abdomen and pelvis was performed following the standard protocol without IV contrast. COMPARISON:  06/18/2012 FINDINGS: Lower chest: Pericardial cyst at the right cardiophrenic sulcus. Coronary atherosclerosis. Hepatobiliary: Scattered granulomatous calcifications in the liver. Small hepatic low densities without worrisome change from before. Two of these in the inferior left lobe have a contracted appearance since prior suggesting interval cyst collapse.No evidence of biliary obstruction or stone. Pancreas: Unremarkable. Spleen: Granulomatous calcification. Adrenals/Urinary Tract: Negative adrenals. No hydronephrosis or stone. Unremarkable bladder. Stomach/Bowel: No obstruction. No visible bowel inflammation. Innumerable distal colonic diverticula. Fecalized contents within a few small bowel loops without generalized obstructive pattern. Vascular/Lymphatic: No acute vascular abnormality. Atheromatous calcifications of the aorta and branch vessels. No mass or adenopathy. Reproductive:Hysterectomy. High-density at the vaginal cuff is stable and likely postoperative. Other: No ascites or pneumoperitoneum. Musculoskeletal: No acute abnormalities. Advanced lumbar spine degeneration with scoliosis and L4-5 ankylosis. IMPRESSION: 1. No acute finding.  No hydronephrosis or urolithiasis. 2. Extensive distal colonic diverticulosis. 3.  Aortic Atherosclerosis (ICD10-I70.0). Electronically Signed   By: Monte Fantasia M.D.   On: 04/17/2020 06:54    Procedures Procedures  Medications Ordered in the ED Medications - No data to display   MDM Rules/Calculators/A&P MDM Patient with L flank pain and recent dysuria. UA done prior to my evaluation was negative for infection. CT stone study also negative. Patient reports pain has resolved since she took the tylenol but she  is  scared it will come back.   ED Course  I have reviewed the triage vital signs and the nursing notes.  Pertinent labs & imaging results that were available during my care of the patient were reviewed by me and considered in my medical decision making (see chart for details).  Clinical Course as of 04/17/20 0901  Sat Apr 17, 2020  0718 CBC with mild leukopenia. Otherwise normal  [CS]  0820 BMP is normal.  [CS]  6773 Patient remains asymptomatic. Low suspicion for cord compression causing her urinary symptoms as she does not have any other red flags and pain is resolved now. Recommend PCP follow up for consideration of urology referral. For the muscle spasms, will give Rx for Flexeril to take as needed, advised this may make her drowsy and to make sure she has help getting around. RTED for any worsening [CS]    Clinical Course User Index [CS] Truddie Hidden, MD    Final Clinical Impression(s) / ED Diagnoses Final diagnoses:  Urinary frequency  Acute left-sided low back pain without sciatica    Rx / DC Orders ED Discharge Orders         Ordered    cyclobenzaprine (FLEXERIL) 10 MG tablet  2 times daily PRN        04/17/20 0901           Truddie Hidden, MD 04/17/20 [phone]

## 2020-04-17 NOTE — ED Triage Notes (Signed)
Pt arrived via walk in, c/o left sided flank pain x3 days, urinary frequency and worsening bladder control. Denies any painful urination. States when she got up this morning to urinate, started having back spasms when attempted to get up from commode. Took two extra strength tylenol with little relief.

## 2020-04-20 DIAGNOSIS — M25661 Stiffness of right knee, not elsewhere classified: Secondary | ICD-10-CM | POA: Diagnosis not present

## 2020-04-20 DIAGNOSIS — Z96651 Presence of right artificial knee joint: Secondary | ICD-10-CM | POA: Diagnosis not present

## 2020-04-21 DIAGNOSIS — F419 Anxiety disorder, unspecified: Secondary | ICD-10-CM | POA: Diagnosis not present

## 2020-04-21 DIAGNOSIS — M545 Low back pain, unspecified: Secondary | ICD-10-CM | POA: Diagnosis not present

## 2020-04-21 DIAGNOSIS — I10 Essential (primary) hypertension: Secondary | ICD-10-CM | POA: Diagnosis not present

## 2020-04-21 DIAGNOSIS — M1990 Unspecified osteoarthritis, unspecified site: Secondary | ICD-10-CM | POA: Diagnosis not present

## 2020-04-27 DIAGNOSIS — Z96651 Presence of right artificial knee joint: Secondary | ICD-10-CM | POA: Diagnosis not present

## 2020-04-27 DIAGNOSIS — M25661 Stiffness of right knee, not elsewhere classified: Secondary | ICD-10-CM | POA: Diagnosis not present

## 2020-04-29 DIAGNOSIS — M25661 Stiffness of right knee, not elsewhere classified: Secondary | ICD-10-CM | POA: Diagnosis not present

## 2020-04-29 DIAGNOSIS — Z96651 Presence of right artificial knee joint: Secondary | ICD-10-CM | POA: Diagnosis not present

## 2020-05-03 DIAGNOSIS — Z96651 Presence of right artificial knee joint: Secondary | ICD-10-CM | POA: Diagnosis not present

## 2020-05-03 DIAGNOSIS — M25661 Stiffness of right knee, not elsewhere classified: Secondary | ICD-10-CM | POA: Diagnosis not present

## 2020-05-04 DIAGNOSIS — Z96651 Presence of right artificial knee joint: Secondary | ICD-10-CM | POA: Diagnosis not present

## 2020-05-04 DIAGNOSIS — M25661 Stiffness of right knee, not elsewhere classified: Secondary | ICD-10-CM | POA: Diagnosis not present

## 2020-05-10 DIAGNOSIS — Z96651 Presence of right artificial knee joint: Secondary | ICD-10-CM | POA: Diagnosis not present

## 2020-05-10 DIAGNOSIS — M25661 Stiffness of right knee, not elsewhere classified: Secondary | ICD-10-CM | POA: Diagnosis not present

## 2020-05-11 DIAGNOSIS — E782 Mixed hyperlipidemia: Secondary | ICD-10-CM | POA: Diagnosis not present

## 2020-05-14 DIAGNOSIS — Z96651 Presence of right artificial knee joint: Secondary | ICD-10-CM | POA: Diagnosis not present

## 2020-05-14 DIAGNOSIS — M25661 Stiffness of right knee, not elsewhere classified: Secondary | ICD-10-CM | POA: Diagnosis not present

## 2020-05-17 DIAGNOSIS — Z23 Encounter for immunization: Secondary | ICD-10-CM | POA: Diagnosis not present

## 2020-05-18 DIAGNOSIS — J45909 Unspecified asthma, uncomplicated: Secondary | ICD-10-CM | POA: Diagnosis not present

## 2020-05-18 DIAGNOSIS — I519 Heart disease, unspecified: Secondary | ICD-10-CM | POA: Diagnosis not present

## 2020-05-18 DIAGNOSIS — F419 Anxiety disorder, unspecified: Secondary | ICD-10-CM | POA: Diagnosis not present

## 2020-05-18 DIAGNOSIS — M1990 Unspecified osteoarthritis, unspecified site: Secondary | ICD-10-CM | POA: Diagnosis not present

## 2020-05-18 DIAGNOSIS — I4891 Unspecified atrial fibrillation: Secondary | ICD-10-CM | POA: Diagnosis not present

## 2020-05-18 DIAGNOSIS — I10 Essential (primary) hypertension: Secondary | ICD-10-CM | POA: Diagnosis not present

## 2020-05-18 DIAGNOSIS — R82998 Other abnormal findings in urine: Secondary | ICD-10-CM | POA: Diagnosis not present

## 2020-05-18 DIAGNOSIS — Z0000 Encounter for general adult medical examination without abnormal findings: Secondary | ICD-10-CM | POA: Diagnosis not present

## 2020-05-18 DIAGNOSIS — Q078 Other specified congenital malformations of nervous system: Secondary | ICD-10-CM | POA: Diagnosis not present

## 2020-05-18 DIAGNOSIS — E782 Mixed hyperlipidemia: Secondary | ICD-10-CM | POA: Diagnosis not present

## 2020-05-20 DIAGNOSIS — B3781 Candidal esophagitis: Secondary | ICD-10-CM | POA: Diagnosis not present

## 2020-05-20 DIAGNOSIS — Z1152 Encounter for screening for COVID-19: Secondary | ICD-10-CM | POA: Diagnosis not present

## 2020-05-20 DIAGNOSIS — J309 Allergic rhinitis, unspecified: Secondary | ICD-10-CM | POA: Diagnosis not present

## 2020-05-20 DIAGNOSIS — J069 Acute upper respiratory infection, unspecified: Secondary | ICD-10-CM | POA: Diagnosis not present

## 2020-05-20 DIAGNOSIS — U071 COVID-19: Secondary | ICD-10-CM | POA: Diagnosis not present

## 2020-05-20 DIAGNOSIS — J45909 Unspecified asthma, uncomplicated: Secondary | ICD-10-CM | POA: Diagnosis not present

## 2020-06-07 DIAGNOSIS — Z96651 Presence of right artificial knee joint: Secondary | ICD-10-CM | POA: Diagnosis not present

## 2020-06-07 DIAGNOSIS — M25661 Stiffness of right knee, not elsewhere classified: Secondary | ICD-10-CM | POA: Diagnosis not present

## 2020-06-09 ENCOUNTER — Other Ambulatory Visit: Payer: Self-pay

## 2020-06-09 ENCOUNTER — Encounter (INDEPENDENT_AMBULATORY_CARE_PROVIDER_SITE_OTHER): Payer: Medicare Other | Admitting: Ophthalmology

## 2020-06-09 DIAGNOSIS — I10 Essential (primary) hypertension: Secondary | ICD-10-CM

## 2020-06-09 DIAGNOSIS — H26492 Other secondary cataract, left eye: Secondary | ICD-10-CM

## 2020-06-09 DIAGNOSIS — H3509 Other intraretinal microvascular abnormalities: Secondary | ICD-10-CM | POA: Diagnosis not present

## 2020-06-09 DIAGNOSIS — H35033 Hypertensive retinopathy, bilateral: Secondary | ICD-10-CM

## 2020-06-09 DIAGNOSIS — H43813 Vitreous degeneration, bilateral: Secondary | ICD-10-CM | POA: Diagnosis not present

## 2020-06-14 DIAGNOSIS — M25661 Stiffness of right knee, not elsewhere classified: Secondary | ICD-10-CM | POA: Diagnosis not present

## 2020-06-14 DIAGNOSIS — Z96651 Presence of right artificial knee joint: Secondary | ICD-10-CM | POA: Diagnosis not present

## 2020-06-17 NOTE — Progress Notes (Signed)
Patient Care Team: [name], MD as PCP - General (Internal Medicine) Burnell Blanks, MD as PCP - Cardiology (Cardiology)   HPI  Bridget Cortez is a 75 y.o. female Seen in followup for [facility] which had ECG features of VT (concordance//aVR) but initiation and termination sequences suggestive of SVT--GT agreed more likely SVT  Rx with amlodipine>> metoprolol  Continues with occasional palpitations typically short duration 2-3 minutes.  Relieved with sitting down.  Underwent knee replacement 8/21 now with much less pain.  Swelling no and physical therapy is hard.     The patient denies lightheadedness or syncope.     4th COVID Booster taken-Covid + April 2022. Post symptoms are shortness of breath. No exertional chest pain or discomfort. No palpitations.  Seen by her PCP and noted to be tachycardic.  Long standing peripheral edema.  Although she says it is improved  Worsening orthopnea Sleeps on 3-4 high pillows secondary to her coughing at night. No nocturnal dyspnea or orthopnea.      DATE TEST EF   8/16 Echo   55-65 %   7/19 Echo   55-60 %    Date Cr K Hgb  2/20 0.86 4.1 12.3  6/20  0.62 3.8   8/21 0.61 4.0 12.8    3/22 0.54 3.7 12.6   Past Medical History:  Diagnosis Date  . Abnormal Pap smear 2006   vaginal medicine according to patient  . Allergy   . Arthritis   . Asthma   . 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 Breast cancer ([place]) 1999   Left  . CAP (community acquired pneumonia) 08/06/2014  . Cataract    cataracts lt. eye    cataract removed.  . Chiari malformation   . Diverticulosis   . Dysrhythmia    SVT  . GERD (gastroesophageal reflux disease)   . H/O osteopenia    08/2006  . H/O varicella   . Heart murmur   . Hypertension   . Monilial vaginitis 07/2007  . Multiple allergies   . Ovarian cyst   . Personal history of colonic adenomas 03/13/2012  . Personal history of radiation therapy 1990  . Pneumonia 07/2014  . VIN III  (vulvar intraepithelial neoplasia III) 03/2009  . Yeast infection     Past Surgical History:  Procedure Laterality Date  . ABDOMINAL HYSTERECTOMY    . ABDOMINAL SURGERY    . BREAST EXCISIONAL BIOPSY Right    benign  . BREAST LUMPECTOMY Left 1990  . BREAST SURGERY     Lumpectomy, left breast  . CATARACT EXTRACTION Left 2018  . COLONOSCOPY    . COLONOSCOPY W/ BIOPSIES    . EYE SURGERY    . HERNIA REPAIR    . KNEE SURGERY     right  . lt. eye aneurysm surgery Left 2017  . MOUTH SURGERY  2019   rt. side gum surgery  Lt. done 3 years ago  . RADIOACTIVE SEED GUIDED EXCISIONAL BREAST BIOPSY Right 07/09/2018   Procedure: RADIOACTIVE SEED GUIDED EXCISIONAL RIGHT  BREAST BIOPSY;  Surgeon: Stark Izadora Roehr, MD;  Location: [facility];  Service: General;  Laterality: Right;  . TOTAL KNEE ARTHROPLASTY Right 09/09/2019   Procedure: RIGHT TOTAL KNEE ARTHROPLASTY;  Surgeon: Melrose Nakayama, MD;  Location: [facility];  Service: Orthopedics;  Laterality: Right;    Current Meds  Medication Sig  . acetaminophen (TYLENOL) 325 MG tablet Take 2 tablets (650 mg total) by mouth every 6 (six) hours as  needed for mild pain, fever or headache (or Fever >/= 101).  Marland Kitchen albuterol (PROVENTIL) (5 MG/ML) 0.5% nebulizer solution Take 0.5 mLs (2.5 mg total) by nebulization every 6 (six) hours as needed for wheezing or shortness of breath.  Marland Kitchen amLODipine (NORVASC) 10 MG tablet Take 5 mg by mouth at bedtime.   Marland Kitchen aspirin EC 81 MG tablet Take 81 mg by mouth daily. Swallow whole.  . Calcium Carbonate-Vitamin D (CALTRATE 600+D PO) Take 1 tablet by mouth daily with lunch.   . cetirizine (ZYRTEC) 10 MG tablet Take 10 mg by mouth daily.  . cyclobenzaprine (FLEXERIL) 10 MG tablet Take 1 tablet (10 mg total) by mouth 2 (two) times daily as needed for muscle spasms.  Marland Kitchen esomeprazole (NEXIUM) 40 MG capsule Take 40 mg by mouth 2 (two) times daily before a meal.   . fluticasone (FLONASE) 50 MCG/ACT nasal spray Place 1 spray into both nostrils at  bedtime.   . Fluticasone-Salmeterol (ADVAIR) 250-50 MCG/DOSE AEPB Inhale 1 puff into the lungs every 12 (twelve) hours.  . Multiple Vitamin (MULTIVITAMIN) tablet Take 1 tablet by mouth daily with lunch.   . nebivolol (BYSTOLIC) 5 MG tablet Take 1 tablet by mouth once daily  . potassium chloride SA (KLOR-CON) 20 MEQ tablet Take 60 mEq by mouth daily.  . rosuvastatin (CRESTOR) 5 MG tablet Take 5 mg by mouth daily.  . simethicone (MYLICON) 109 MG chewable tablet Chew 125 mg by mouth every 6 (six) hours as needed for flatulence.  Marland Kitchen telmisartan (MICARDIS) 40 MG tablet Take 1 tablet by mouth daily.  . traMADol (ULTRAM) 50 MG tablet Take 1 tablet by mouth as needed for pain.  . traZODone (DESYREL) 50 MG tablet Take 0.5 tablets by mouth as needed for sleep.  . vitamin C (ASCORBIC ACID) 500 MG tablet Take 500 mg by mouth daily.  . [DISCONTINUED] losartan (COZAAR) 100 MG tablet Take 100 mg by mouth daily.    Allergies  Allergen Reactions  . Ephedrine Shortness Of Breath  . Cortisone Other (See Comments)    Severe muscle spasms    Review of Systems negative except from HPI and PMH  Physical Exam BP 122/82   Pulse (!) 109   Ht 5\' 8"  (1.727 m)   Wt 240 lb (108.9 kg)   SpO2 99%   BMI 36.49 kg/m  Well developed and nourished in no acute distress HENT normal Neck supple with JVP- >10 Clear Rapid but regular rate and rhythm, no murmurs or gallops Abd-soft with active BS No Clubbing cyanosis, +2-3 pitting edema Skin-warm and dry A & Oriented  Grossly normal sensory and motor function  ECG atrial flutter-2: 1 conduction it is ventricular rate of 109 Interval-/10/33 Axis leftward at -22   Assessment and  Plan  WCT probably SVT  Atrial flutter-2: 1 conduction  HTN  HFpEF acute/chronic Tachy palpitations with worsening shortness of breath and atrial flutter with rapid conduction.  Heart rate control will be difficult; hence, we will proceed towards cardioversion.  We will discontinue  aspirin and begin her on Eliquis 5 mg twice daily.  Have reviewed risks and benefits compared to aspirin.  Discussed the physiology of thromboembolism and that anticoagulation would be lifelong  She is volume overloaded.  She takes high-dose potassium for cramps; appears not to be on a diuretic.  There are some questions about her medications however, we will plan to begin her on Demadex 20 mg daily  Her blood pressure is reasonably controlled on her multiple medications  which include amlodipine, bisoprolol and some unknown medication still to be identified we will continue these.  I,Alexis Bryant,acting as a scribe for Virl Axe, MD.,have documented all relevant documentation on the behalf of Virl Axe, MD,as directed by  Virl Axe, MD while in the presence of Virl Axe, MD.  I, Virl Axe, MD, have reviewed all documentation for this visit. The documentation on 06/19/20 for the exam, diagnosis, procedures, and orders are all accurate and complete.

## 2020-06-18 ENCOUNTER — Encounter: Payer: Self-pay | Admitting: Internal Medicine

## 2020-06-18 ENCOUNTER — Other Ambulatory Visit: Payer: Self-pay

## 2020-06-18 ENCOUNTER — Ambulatory Visit (INDEPENDENT_AMBULATORY_CARE_PROVIDER_SITE_OTHER): Payer: Medicare Other | Admitting: Internal Medicine

## 2020-06-18 VITALS — BP 122/82 | HR 109 | Ht 68.0 in | Wt 240.0 lb

## 2020-06-18 DIAGNOSIS — I5030 Unspecified diastolic (congestive) heart failure: Secondary | ICD-10-CM | POA: Diagnosis not present

## 2020-06-18 DIAGNOSIS — I472 Ventricular tachycardia: Secondary | ICD-10-CM | POA: Diagnosis not present

## 2020-06-18 DIAGNOSIS — I471 Supraventricular tachycardia: Secondary | ICD-10-CM

## 2020-06-18 DIAGNOSIS — R000 Tachycardia, unspecified: Secondary | ICD-10-CM

## 2020-06-18 DIAGNOSIS — I4892 Unspecified atrial flutter: Secondary | ICD-10-CM | POA: Diagnosis not present

## 2020-06-18 DIAGNOSIS — M25661 Stiffness of right knee, not elsewhere classified: Secondary | ICD-10-CM | POA: Diagnosis not present

## 2020-06-18 DIAGNOSIS — Z96651 Presence of right artificial knee joint: Secondary | ICD-10-CM | POA: Diagnosis not present

## 2020-06-18 MED ORDER — TORSEMIDE 20 MG PO TABS: 20.0000 mg | ORAL_TABLET | Freq: Every day | ORAL | 3 refills | Status: DC

## 2020-06-18 MED ORDER — APIXABAN 5 MG PO TABS: 5.0000 mg | ORAL_TABLET | Freq: Two times a day (BID) | ORAL | 3 refills | Status: DC

## 2020-06-18 NOTE — Patient Instructions (Signed)
Medication Instructions:  Your physician has recommended you make the following change in your medication:   Stop Aspirin Begin Eliquis 5mg  - 1 tablet by mouth twice daily Begin Toresmide 20mg  - 1 tablet by mouth daily  *If you need a refill on your cardiac medications before your next appointment, please call your pharmacy*   Lab Work: None ordered.  If you have labs (blood work) drawn today and your tests are completely normal, you will receive your results only by: Marland Kitchen MyChart Message (if you have MyChart) OR . A paper copy in the mail If you have any lab test that is abnormal or we need to change your treatment, we will call you to review the results.   Testing/Procedures: Your physician has recommended that you have a Cardioversion (DCCV). Electrical Cardioversion uses a jolt of electricity to your heart either through paddles or wired patches attached to your chest. This is a controlled, usually prescheduled, procedure. Defibrillation is done under light anesthesia in the hospital, and you usually go home the day of the procedure. This is done to get your heart back into a normal rhythm. You are not awake for the procedure. Please see the instruction sheet given to you today.    Follow-Up: At [facility], you and your health needs are our priority.  As part of our continuing mission to provide you with exceptional heart care, we have created designated Provider Care Teams.  These Care Teams include your primary Cardiologist (physician) and Advanced Practice Providers (APPs -  Physician Assistants and Nurse Practitioners) who all work together to provide you with the care you need, when you need it.  We recommend signing up for the patient portal called "MyChart".  Sign up information is provided on this After Visit Summary.  MyChart is used to connect with patients for Virtual Visits (Telemedicine).  Patients are able to view lab/test results, encounter notes, upcoming appointments,  etc.  Non-urgent messages can be sent to your provider as well.   To learn more about what you can do with MyChart, go to [email or web address].    Your next appointment:   3-4 weeks with AFIB clinic - Someone will call you to schedule this appointment  Follow up with Dr Caryl Comes in 6 months

## 2020-06-22 DIAGNOSIS — Z96651 Presence of right artificial knee joint: Secondary | ICD-10-CM | POA: Diagnosis not present

## 2020-06-22 DIAGNOSIS — M25661 Stiffness of right knee, not elsewhere classified: Secondary | ICD-10-CM | POA: Diagnosis not present

## 2020-06-23 ENCOUNTER — Other Ambulatory Visit: Payer: Self-pay

## 2020-06-23 ENCOUNTER — Encounter (INDEPENDENT_AMBULATORY_CARE_PROVIDER_SITE_OTHER): Payer: Medicare Other | Admitting: Ophthalmology

## 2020-06-23 DIAGNOSIS — H2702 Aphakia, left eye: Secondary | ICD-10-CM

## 2020-06-29 DIAGNOSIS — M25661 Stiffness of right knee, not elsewhere classified: Secondary | ICD-10-CM | POA: Diagnosis not present

## 2020-06-29 DIAGNOSIS — Z96651 Presence of right artificial knee joint: Secondary | ICD-10-CM | POA: Diagnosis not present

## 2020-07-01 DIAGNOSIS — M25661 Stiffness of right knee, not elsewhere classified: Secondary | ICD-10-CM | POA: Diagnosis not present

## 2020-07-01 DIAGNOSIS — Z96651 Presence of right artificial knee joint: Secondary | ICD-10-CM | POA: Diagnosis not present

## 2020-07-05 ENCOUNTER — Other Ambulatory Visit: Payer: Self-pay | Admitting: Internal Medicine

## 2020-07-05 DIAGNOSIS — L304 Erythema intertrigo: Secondary | ICD-10-CM | POA: Diagnosis not present

## 2020-07-05 DIAGNOSIS — L821 Other seborrheic keratosis: Secondary | ICD-10-CM | POA: Diagnosis not present

## 2020-07-06 DIAGNOSIS — G935 Compression of brain: Secondary | ICD-10-CM | POA: Diagnosis not present

## 2020-07-14 ENCOUNTER — Encounter (HOSPITAL_COMMUNITY): Payer: Self-pay | Admitting: Nurse Practitioner

## 2020-07-14 ENCOUNTER — Other Ambulatory Visit: Payer: Self-pay

## 2020-07-14 ENCOUNTER — Ambulatory Visit (HOSPITAL_COMMUNITY)
Admission: RE | Admit: 2020-07-14 | Discharge: 2020-07-14 | Disposition: A | Discharge status: Home / Self Care | Payer: Medicare Other | Source: Ambulatory Visit | Attending: Nurse Practitioner | Admitting: Nurse Practitioner

## 2020-07-14 VITALS — BP 116/76 | HR 103 | Ht 68.0 in | Wt 231.8 lb

## 2020-07-14 DIAGNOSIS — Z7951 Long term (current) use of inhaled steroids: Secondary | ICD-10-CM | POA: Diagnosis not present

## 2020-07-14 DIAGNOSIS — Z87891 Personal history of nicotine dependence: Secondary | ICD-10-CM | POA: Insufficient documentation

## 2020-07-14 DIAGNOSIS — I4892 Unspecified atrial flutter: Secondary | ICD-10-CM | POA: Diagnosis not present

## 2020-07-14 DIAGNOSIS — Z7901 Long term (current) use of anticoagulants: Secondary | ICD-10-CM | POA: Insufficient documentation

## 2020-07-14 DIAGNOSIS — I10 Essential (primary) hypertension: Secondary | ICD-10-CM | POA: Insufficient documentation

## 2020-07-14 DIAGNOSIS — Z79899 Other long term (current) drug therapy: Secondary | ICD-10-CM | POA: Insufficient documentation

## 2020-07-14 DIAGNOSIS — I4891 Unspecified atrial fibrillation: Secondary | ICD-10-CM | POA: Diagnosis not present

## 2020-07-14 LAB — COMPREHENSIVE METABOLIC PANEL
ALT: 21 U/L (ref 0–44)
AST: 19 U/L (ref 15–41)
Albumin: 3.6 g/dL (ref 3.5–5.0)
Alkaline Phosphatase: 70 U/L (ref 38–126)
Anion gap: 8 (ref 5–15)
BUN: 10 mg/dL (ref 8–23)
CO2: 26 mmol/L (ref 22–32)
Calcium: 9 mg/dL (ref 8.9–10.3)
Chloride: 97 mmol/L — ABNORMAL LOW (ref 98–111)
Creatinine, Ser: 0.78 mg/dL (ref 0.44–1.00)
GFR, Estimated: >60 mL/min (ref >60)
Glucose, Bld: 94 mg/dL (ref 70–99)
Potassium: 3.8 mmol/L (ref 3.5–5.1)
Sodium: 131 mmol/L — ABNORMAL LOW (ref 135–145)
Total Bilirubin: 0.9 mg/dL (ref 0.3–1.2)
Total Protein: 6.7 g/dL (ref 6.5–8.1)

## 2020-07-14 LAB — CBC
HCT: 36.5 % (ref 36.0–46.0)
Hemoglobin: 11.9 g/dL — ABNORMAL LOW (ref 12.0–15.0)
MCH: 28 pg (ref 26.0–34.0)
MCHC: 32.6 g/dL (ref 30.0–36.0)
MCV: 85.9 fL (ref 80.0–100.0)
Platelets: 188 10*3/uL (ref 150–400)
RBC: 4.25 MIL/uL (ref 3.87–5.11)
RDW: 15.9 % — ABNORMAL HIGH (ref 11.5–15.5)
WBC: 2.8 10*3/uL — ABNORMAL LOW (ref 4.0–10.5)
nRBC: 0 % (ref 0.0–0.2)

## 2020-07-14 NOTE — Progress Notes (Signed)
Primary Care Physician: [name], MD Referring Physician: Dr. Freddie Breech Bridget Cortez is a 75 y.o. female with a h/o SVT, HTN, HF, that is in the afib clinic f/u from Dr. Olin Pia visit from 06/18/20. At that time she was found to be in new onset atrial flutter and started on DOAC and continued with bisoprolol with reasonable rate control. She was fluid overloaded and started on Demadex 20 mg daily. She is now her to discuss cardioversion as she has been on anticoagulation for an appropriate period of time without any missed doses. Pt is very bright and well spoken. 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 She moved back here  with her husband to be near her daughter and grandchildren. She had her rt knee replaced last year and has been  having on going physical therapy, but has not had the energy to do so being out of rhythm.   Today, she denies symptoms of palpitations, chest pain, shortness of breath, orthopnea, PND, lower extremity edema, dizziness, presyncope, syncope, or neurologic sequela. The patient is tolerating medications without difficulties and is otherwise without complaint today.   Past Medical History:  Diagnosis Date   Abnormal Pap smear 2006   vaginal medicine according to patient   Allergy    Arthritis    Asthma    Atrophy of vagina 06/2005   Breast cancer ([facility] [place]) 1999   Left   CAP (community acquired pneumonia) 08/06/2014   Cataract    cataracts lt. eye    cataract removed.   Chiari malformation    Diverticulosis    Dysrhythmia    SVT   GERD (gastroesophageal reflux disease)    H/O osteopenia    08/2006   H/O varicella    Heart murmur    Hypertension    Monilial vaginitis 07/2007   Multiple allergies    Ovarian cyst    Personal history of colonic adenomas 03/13/2012   Personal history of radiation therapy 1990    Pneumonia 07/2014   VIN III (vulvar intraepithelial neoplasia III) 03/2009   Yeast infection    Past Surgical History:  Procedure Laterality Date   ABDOMINAL HYSTERECTOMY     ABDOMINAL SURGERY     BREAST EXCISIONAL BIOPSY Right    benign   BREAST LUMPECTOMY Left 1990   BREAST SURGERY     Lumpectomy, left breast   CATARACT EXTRACTION Left 2018   COLONOSCOPY     COLONOSCOPY W/ BIOPSIES     EYE SURGERY     HERNIA REPAIR     KNEE SURGERY     right   lt. eye aneurysm surgery Left 2017   MOUTH SURGERY  2019   rt. side gum surgery  Lt. done 3 years ago   RADIOACTIVE SEED GUIDED EXCISIONAL BREAST BIOPSY Right 07/09/2018   Procedure: RADIOACTIVE SEED GUIDED EXCISIONAL RIGHT  BREAST BIOPSY;  Surgeon: Stark Klein, MD;  Location: [facility];  Service: General;  Laterality: Right;   TOTAL KNEE ARTHROPLASTY Right 09/09/2019   Procedure: RIGHT TOTAL KNEE ARTHROPLASTY;  Surgeon: Melrose Nakayama, MD;  Location: [facility];  Service: Orthopedics;  Laterality: Right;    Current Outpatient Medications  Medication Sig Dispense Refill   acetaminophen (TYLENOL) 325 MG tablet Take 2 tablets (650 mg total) by mouth every 6 (six) hours as needed for mild pain, fever or headache (or  Fever >/= 101). (Patient taking differently: Take 650 mg by mouth as needed for mild pain, fever or headache (or Fever >/= 101).)     albuterol (PROVENTIL) (5 MG/ML) 0.5% nebulizer solution Take 0.5 mLs (2.5 mg total) by nebulization every 6 (six) hours as needed for wheezing or shortness of breath. 20 mL 12   amLODipine (NORVASC) 10 MG tablet Take 5 mg by mouth at bedtime.      apixaban (ELIQUIS) 5 MG TABS tablet Take 1 tablet (5 mg total) by mouth 2 (two) times daily. 180 tablet 3   Calcium Carbonate-Vitamin D (CALTRATE 600+D PO) Take 1 tablet by mouth daily with lunch.      cetirizine (ZYRTEC) 10 MG tablet Take 10 mg by mouth daily.     cyclobenzaprine (FLEXERIL) 10 MG tablet Take 1 tablet (10 mg total) by mouth 2 (two) times daily as  needed for muscle spasms. 20 tablet 0   esomeprazole (NEXIUM) 40 MG capsule Take 40 mg by mouth 2 (two) times daily before a meal.      fluticasone (FLONASE) 50 MCG/ACT nasal spray Place 1 spray into both nostrils at bedtime.      Fluticasone-Salmeterol (ADVAIR) 250-50 MCG/DOSE AEPB Inhale 1 puff into the lungs every 12 (twelve) hours.     Multiple Vitamin (MULTIVITAMIN) tablet Take 1 tablet by mouth daily with lunch.      nebivolol (BYSTOLIC) 5 MG tablet Take 1 tablet by mouth once daily 90 tablet 3   potassium chloride SA (KLOR-CON) 20 MEQ tablet Take 60 mEq by mouth daily.     rosuvastatin (CRESTOR) 5 MG tablet Take 5 mg by mouth daily.     simethicone (MYLICON) 161 MG chewable tablet Chew 125 mg by mouth every 6 (six) hours as needed for flatulence.     telmisartan (MICARDIS) 40 MG tablet Take 1 tablet by mouth daily.     torsemide (DEMADEX) 20 MG tablet Take 1 tablet (20 mg total) by mouth daily. 90 tablet 3   traZODone (DESYREL) 50 MG tablet Take 0.5 tablets by mouth as needed for sleep.     triamcinolone cream (KENALOG) 0.1 % SMARTSIG:1 Application Topical 2-3 Times Daily     vitamin C (ASCORBIC ACID) 500 MG tablet Take 500 mg by mouth daily.     traMADol (ULTRAM) 50 MG tablet Take 1 tablet by mouth as needed for pain. (Patient not taking: Reported on 07/14/2020)     No current facility-administered medications for this encounter.    Allergies  Allergen Reactions   Ephedrine Shortness Of Breath   Cortisone Other (See Comments)    Severe muscle spasms    Social History   Socioeconomic History   Marital status: Married    Spouse name: Not on file   Number of children: 1   Years of education: Not on file   Highest education level: Not on file  Occupational History   Occupation: Retired Education officer, museum  Tobacco Use   Smoking status: Former    Pack years: 0.00    Types: Cigarettes    Quit date: 01/23/1978    Years since quitting: 42.5   Smokeless tobacco: Never  Vaping Use    Vaping Use: Never used  Substance and Sexual Activity   Alcohol use: No   Drug use: No   Sexual activity: Yes    Birth control/protection: Surgical  Other Topics Concern   Not on file  Social History Narrative   Not on file   Social Determinants of Health  Financial Resource Strain: Not on file  Food Insecurity: Not on file  Transportation Needs: Not on file  Physical Activity: Not on file  Stress: Not on file  Social Connections: Not on file  Intimate Partner Violence: Not on file    Family History  Problem Relation Age of Onset   Cancer Mother    Heart failure Other    Colon cancer Neg Hx    Esophageal cancer Neg Hx    Rectal cancer Neg Hx    Stomach cancer Neg Hx    Colon polyps Neg Hx     ROS- All systems are reviewed and negative except as per the HPI above  Physical Exam: Vitals:   07/14/20 1038  BP: 116/76  Pulse: (!) 103  Weight: 105.1 kg  Height: 5\' 8"  (1.727 m)   Wt Readings from Last 3 Encounters:  07/14/20 105.1 kg  06/18/20 108.9 kg  04/17/20 106.1 kg    Labs: Lab Results  Component Value Date   NA 131 (L) 07/14/2020   K 3.8 07/14/2020   CL 97 (L) 07/14/2020   CO2 26 07/14/2020   GLUCOSE 94 07/14/2020   BUN 10 07/14/2020   CREATININE 0.78 07/14/2020   CALCIUM 9.0 07/14/2020   MG 2.0 08/03/2017   Lab Results  Component Value Date   INR 1.0 09/03/2019   No results found for: CHOL, HDL, LDLCALC, TRIG   GEN- The patient is well appearing, alert and oriented x 3 today.   Head- normocephalic, atraumatic Eyes-  Sclera clear, conjunctiva pink Ears- hearing intact Oropharynx- clear Neck- supple, no JVP Lymph- no cervical lymphadenopathy Lungs- Clear to ausculation bilaterally, normal work of breathing Heart- irregular rate and rhythm, no murmurs, rubs or gallops, PMI not laterally displaced GI- soft, NT, ND, + BS Extremities- no clubbing, cyanosis, or edema MS- no significant deformity or atrophy Skin- no rash or lesion Psych-  euthymic mood, full affect Neuro- strength and sensation are intact  EKG-afib at 103 bpm, qrs int 100 ms, qtc 406 ms  Echo-Study Conclusions   - Left ventricle: The cavity size was normal. Wall thickness was    increased in a pattern of mild LVH. Systolic function was normal.    The estimated ejection fraction was in the range of 55% to 60%.    Wall motion was normal; there were no regional wall motion    abnormalities. Indeterminate diastolic function.  - Aortic valve: Mildly calcified annulus. Trileaflet; mildly    calcified leaflets.  - Mitral valve: Mildly calcified annulus. There was mild    regurgitation.  - Left atrium: The atrium was at the upper limits of normal in    size.  - Right atrium: The atrium was mildly dilated. Central venous    pressure (est): 3 mm Hg.  - Atrial septum: No defect or patent foramen ovale was identified.  - Tricuspid valve: There was mild regurgitation.  - Pulmonary arteries: PA peak pressure: 23 mm Hg (S).  - Pericardium, extracardiac: A small pericardial effusion was    identified posterior to the heart.    Assessment and Plan:  1. Atrial flutter New onset general education re a flutter Triggers discussed  Will schedule for cardioversion, risk vrs benefit of procedure discussed and she would like to proceed   Continue  bisoprolol  Cbc/bmet today   2. CHA2DS2VASc  score of 5 States no missed doses of eliquis 5 mg bid since starting 5/27 Reminded not to miss any doses of upcoming CV  3. HTN  Stable  4, HF Was started on demadex by Dr. Caryl Comes  Has lost several pounds,pt feels fluid status improved.   F/u here in one week after CV.    Geroge Baseman Arlyn Bumpus, [facility] [address] [place], [place] [address] [phone]

## 2020-07-14 NOTE — Patient Instructions (Signed)
Cardioversion scheduled for Wednesday, July 6th  - Arrive at the [facility] and go to admitting at 1030AM  - Do not eat or drink anything after midnight the night prior to your procedure.  - Take all your morning medication (except diabetic medications) with a sip of water prior to arrival.  - You will not be able to drive home after your procedure.  - Do NOT miss any doses of your blood thinner - if you should miss a dose please notify our office immediately.  - If you feel as if you go back into normal rhythm prior to scheduled cardioversion, please notify our office immediately. If your procedure is canceled in the cardioversion suite you will be charged a cancellation fee.  Patients will be asked to: to mask in public and hand hygiene (no longer quarantine) in the 3 days prior to surgery, to report if any COVID-19-like illness or household contacts to COVID-19 to determine need for testing

## 2020-07-14 NOTE — H&P (View-Only) (Signed)
Primary Care Physician: [name], MD Referring Physician: Dr. Freddie Breech Bridget Cortez is a 75 y.o. female with a h/o SVT, HTN, HF, that is in the afib clinic f/u from Dr. Olin Pia visit from 06/18/20. At that time she was found to be in new onset atrial flutter and started on DOAC and continued with bisoprolol with reasonable rate control. She was fluid overloaded and started on Demadex 20 mg daily. She is now her to discuss cardioversion as she has been on anticoagulation for an appropriate period of time without any missed doses. Pt is very bright and well spoken. 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 She moved back here  with her husband to be near her daughter and grandchildren. She had her rt knee replaced last year and has been  having on going physical therapy, but has not had the energy to do so being out of rhythm.   Today, she denies symptoms of palpitations, chest pain, shortness of breath, orthopnea, PND, lower extremity edema, dizziness, presyncope, syncope, or neurologic sequela. The patient is tolerating medications without difficulties and is otherwise without complaint today.   Past Medical History:  Diagnosis Date   Abnormal Pap smear 2006   vaginal medicine according to patient   Allergy    Arthritis    Asthma    Atrophy of vagina 06/2005   Breast cancer ([facility]) 1999   Left   CAP (community acquired pneumonia) 08/06/2014   Cataract    cataracts lt. eye    cataract removed.   Chiari malformation    Diverticulosis    Dysrhythmia    SVT   GERD (gastroesophageal reflux disease)    H/O osteopenia    08/2006   H/O varicella    Heart murmur    Hypertension    Monilial vaginitis 07/2007   Multiple allergies    Ovarian cyst    Personal history of colonic adenomas 03/13/2012   Personal history of radiation therapy 1990    Pneumonia 07/2014   VIN III (vulvar intraepithelial neoplasia III) 03/2009   Yeast infection    Past Surgical History:  Procedure Laterality Date   ABDOMINAL HYSTERECTOMY     ABDOMINAL SURGERY     BREAST EXCISIONAL BIOPSY Right    benign   BREAST LUMPECTOMY Left 1990   BREAST SURGERY     Lumpectomy, left breast   CATARACT EXTRACTION Left 2018   COLONOSCOPY     COLONOSCOPY W/ BIOPSIES     EYE SURGERY     HERNIA REPAIR     KNEE SURGERY     right   lt. eye aneurysm surgery Left 2017   MOUTH SURGERY  2019   rt. side gum surgery  Lt. done 3 years ago   RADIOACTIVE SEED GUIDED EXCISIONAL BREAST BIOPSY Right 07/09/2018   Procedure: RADIOACTIVE SEED GUIDED EXCISIONAL RIGHT  BREAST BIOPSY;  Surgeon: Stark Klein, MD;  Location: [facility];  Service: General;  Laterality: Right;   TOTAL KNEE ARTHROPLASTY Right 09/09/2019   Procedure: RIGHT TOTAL KNEE ARTHROPLASTY;  Surgeon: Melrose Nakayama, MD;  [facility];  Service: Orthopedics;  Laterality: Right;    Current Outpatient Medications  Medication Sig Dispense Refill   acetaminophen (TYLENOL) 325 MG tablet Take 2 tablets (650 mg total) by mouth every 6 (six) hours as needed for mild pain, fever or headache (or  Fever >/= 101). (Patient taking differently: Take 650 mg by mouth as needed for mild pain, fever or headache (or Fever >/= 101).)     albuterol (PROVENTIL) (5 MG/ML) 0.5% nebulizer solution Take 0.5 mLs (2.5 mg total) by nebulization every 6 (six) hours as needed for wheezing or shortness of breath. 20 mL 12   amLODipine (NORVASC) 10 MG tablet Take 5 mg by mouth at bedtime.      apixaban (ELIQUIS) 5 MG TABS tablet Take 1 tablet (5 mg total) by mouth 2 (two) times daily. 180 tablet 3   Calcium Carbonate-Vitamin D (CALTRATE 600+D PO) Take 1 tablet by mouth daily with lunch.      cetirizine (ZYRTEC) 10 MG tablet Take 10 mg by mouth daily.     cyclobenzaprine (FLEXERIL) 10 MG tablet Take 1 tablet (10 mg total) by mouth 2 (two) times daily as  needed for muscle spasms. 20 tablet 0   esomeprazole (NEXIUM) 40 MG capsule Take 40 mg by mouth 2 (two) times daily before a meal.      fluticasone (FLONASE) 50 MCG/ACT nasal spray Place 1 spray into both nostrils at bedtime.      Fluticasone-Salmeterol (ADVAIR) 250-50 MCG/DOSE AEPB Inhale 1 puff into the lungs every 12 (twelve) hours.     Multiple Vitamin (MULTIVITAMIN) tablet Take 1 tablet by mouth daily with lunch.      nebivolol (BYSTOLIC) 5 MG tablet Take 1 tablet by mouth once daily 90 tablet 3   potassium chloride SA (KLOR-CON) 20 MEQ tablet Take 60 mEq by mouth daily.     rosuvastatin (CRESTOR) 5 MG tablet Take 5 mg by mouth daily.     simethicone (MYLICON) 268 MG chewable tablet Chew 125 mg by mouth every 6 (six) hours as needed for flatulence.     telmisartan (MICARDIS) 40 MG tablet Take 1 tablet by mouth daily.     torsemide (DEMADEX) 20 MG tablet Take 1 tablet (20 mg total) by mouth daily. 90 tablet 3   traZODone (DESYREL) 50 MG tablet Take 0.5 tablets by mouth as needed for sleep.     triamcinolone cream (KENALOG) 0.1 % SMARTSIG:1 Application Topical 2-3 Times Daily     vitamin C (ASCORBIC ACID) 500 MG tablet Take 500 mg by mouth daily.     traMADol (ULTRAM) 50 MG tablet Take 1 tablet by mouth as needed for pain. (Patient not taking: Reported on 07/14/2020)     No current facility-administered medications for this encounter.    Allergies  Allergen Reactions   Ephedrine Shortness Of Breath   Cortisone Other (See Comments)    Severe muscle spasms    Social History   Socioeconomic History   Marital status: Married    Spouse name: Not on file   Number of children: 1   Years of education: Not on file   Highest education level: Not on file  Occupational History   Occupation: Retired Education officer, museum  Tobacco Use   Smoking status: Former    Pack years: 0.00    Types: Cigarettes    Quit date: 01/23/1978    Years since quitting: 42.5   Smokeless tobacco: Never  Vaping Use    Vaping Use: Never used  Substance and Sexual Activity   Alcohol use: No   Drug use: No   Sexual activity: Yes    Birth control/protection: Surgical  Other Topics Concern   Not on file  Social History Narrative   Not on file   Social Determinants of Health  Financial Resource Strain: Not on file  Food Insecurity: Not on file  Transportation Needs: Not on file  Physical Activity: Not on file  Stress: Not on file  Social Connections: Not on file  Intimate Partner Violence: Not on file    Family History  Problem Relation Age of Onset   Cancer Mother    Heart failure Other    Colon cancer Neg Hx    Esophageal cancer Neg Hx    Rectal cancer Neg Hx    Stomach cancer Neg Hx    Colon polyps Neg Hx     ROS- All systems are reviewed and negative except as per the HPI above  Physical Exam: Vitals:   07/14/20 1038  BP: 116/76  Pulse: (!) 103  Weight: 105.1 kg  Height: 5\' 8"  (1.727 m)   Wt Readings from Last 3 Encounters:  07/14/20 105.1 kg  06/18/20 108.9 kg  04/17/20 106.1 kg    Labs: Lab Results  Component Value Date   NA 131 (L) 07/14/2020   K 3.8 07/14/2020   CL 97 (L) 07/14/2020   CO2 26 07/14/2020   GLUCOSE 94 07/14/2020   BUN 10 07/14/2020   CREATININE 0.78 07/14/2020   CALCIUM 9.0 07/14/2020   MG 2.0 08/03/2017   Lab Results  Component Value Date   INR 1.0 09/03/2019   No results found for: CHOL, HDL, LDLCALC, TRIG   GEN- The patient is well appearing, alert and oriented x 3 today.   Head- normocephalic, atraumatic Eyes-  Sclera clear, conjunctiva pink Ears- hearing intact Oropharynx- clear Neck- supple, no JVP Lymph- no cervical lymphadenopathy Lungs- Clear to ausculation bilaterally, normal work of breathing Heart- irregular rate and rhythm, no murmurs, rubs or gallops, PMI not laterally displaced GI- soft, NT, ND, + BS Extremities- no clubbing, cyanosis, or edema MS- no significant deformity or atrophy Skin- no rash or lesion Psych-  euthymic mood, full affect Neuro- strength and sensation are intact  EKG-afib at 103 bpm, qrs int 100 ms, qtc 406 ms  Echo-Study Conclusions   - Left ventricle: The cavity size was normal. Wall thickness was    increased in a pattern of mild LVH. Systolic function was normal.    The estimated ejection fraction was in the range of 55% to 60%.    Wall motion was normal; there were no regional wall motion    abnormalities. Indeterminate diastolic function.  - Aortic valve: Mildly calcified annulus. Trileaflet; mildly    calcified leaflets.  - Mitral valve: Mildly calcified annulus. There was mild    regurgitation.  - Left atrium: The atrium was at the upper limits of normal in    size.  - Right atrium: The atrium was mildly dilated. Central venous    pressure (est): 3 mm Hg.  - Atrial septum: No defect or patent foramen ovale was identified.  - Tricuspid valve: There was mild regurgitation.  - Pulmonary arteries: PA peak pressure: 23 mm Hg (S).  - Pericardium, extracardiac: A small pericardial effusion was    identified posterior to the heart.    Assessment and Plan:  1. Atrial flutter New onset general education re a flutter Triggers discussed  Will schedule for cardioversion, risk vrs benefit of procedure discussed and she would like to proceed   Continue  bisoprolol  Cbc/bmet today   2. CHA2DS2VASc  score of 5 States no missed doses of eliquis 5 mg bid since starting 5/27 Reminded not to miss any doses of upcoming CV  3. HTN  Stable  4, HF Was started on demadex by Dr. Caryl Comes  Has lost several pounds,pt feels fluid status improved.   F/u here in one week after CV.    Geroge Baseman Jaylynne Birkhead, [facility] [address] [place], [place] [address] [phone]

## 2020-07-28 ENCOUNTER — Ambulatory Visit (HOSPITAL_COMMUNITY): Payer: Medicare Other

## 2020-07-28 ENCOUNTER — Other Ambulatory Visit: Payer: Self-pay

## 2020-07-28 ENCOUNTER — Ambulatory Visit (HOSPITAL_COMMUNITY)
Admission: RE | Admit: 2020-07-28 | Discharge: 2020-07-28 | Disposition: A | Discharge status: Home / Self Care | Payer: Medicare Other | Attending: Cardiology | Admitting: Cardiology

## 2020-07-28 ENCOUNTER — Encounter (HOSPITAL_COMMUNITY)
Admission: RE | Disposition: A | Discharge status: Home / Self Care | Payer: Self-pay | Source: Home / Self Care | Attending: Cardiology

## 2020-07-28 ENCOUNTER — Encounter (HOSPITAL_COMMUNITY): Payer: Self-pay | Admitting: Cardiology

## 2020-07-28 DIAGNOSIS — I4891 Unspecified atrial fibrillation: Secondary | ICD-10-CM | POA: Insufficient documentation

## 2020-07-28 DIAGNOSIS — I5030 Unspecified diastolic (congestive) heart failure: Secondary | ICD-10-CM | POA: Diagnosis not present

## 2020-07-28 DIAGNOSIS — I110 Hypertensive heart disease with heart failure: Secondary | ICD-10-CM | POA: Diagnosis not present

## 2020-07-28 DIAGNOSIS — Z79899 Other long term (current) drug therapy: Secondary | ICD-10-CM | POA: Insufficient documentation

## 2020-07-28 DIAGNOSIS — Z8249 Family history of ischemic heart disease and other diseases of the circulatory system: Secondary | ICD-10-CM | POA: Diagnosis not present

## 2020-07-28 DIAGNOSIS — Z87891 Personal history of nicotine dependence: Secondary | ICD-10-CM | POA: Diagnosis not present

## 2020-07-28 DIAGNOSIS — I4819 Other persistent atrial fibrillation: Secondary | ICD-10-CM

## 2020-07-28 DIAGNOSIS — Z809 Family history of malignant neoplasm, unspecified: Secondary | ICD-10-CM | POA: Diagnosis not present

## 2020-07-28 DIAGNOSIS — Z7901 Long term (current) use of anticoagulants: Secondary | ICD-10-CM | POA: Diagnosis not present

## 2020-07-28 DIAGNOSIS — I471 Supraventricular tachycardia: Secondary | ICD-10-CM | POA: Diagnosis not present

## 2020-07-28 DIAGNOSIS — I509 Heart failure, unspecified: Secondary | ICD-10-CM | POA: Diagnosis not present

## 2020-07-28 DIAGNOSIS — I491 Atrial premature depolarization: Secondary | ICD-10-CM | POA: Insufficient documentation

## 2020-07-28 DIAGNOSIS — Z7951 Long term (current) use of inhaled steroids: Secondary | ICD-10-CM | POA: Diagnosis not present

## 2020-07-28 HISTORY — PX: CARDIOVERSION: SHX1299

## 2020-07-28 SURGERY — CARDIOVERSION
Anesthesia: General

## 2020-07-28 MED ORDER — SODIUM CHLORIDE 0.9 % IV SOLN: INTRAVENOUS | Status: DC | PRN

## 2020-07-28 MED ORDER — LIDOCAINE 2% (20 MG/ML) 5 ML SYRINGE
INTRAMUSCULAR | Status: DC | PRN
  Administered 2020-07-28: 60 mg via INTRAVENOUS

## 2020-07-28 MED ORDER — PROPOFOL 10 MG/ML IV BOLUS
INTRAVENOUS | Status: DC | PRN
  Administered 2020-07-28: 50 mg via INTRAVENOUS

## 2020-07-28 NOTE — Anesthesia Postprocedure Evaluation (Signed)
Anesthesia Post Note  Patient: Bridget Cortez  Procedure(s) Performed: CARDIOVERSION     Patient location during evaluation: Endoscopy Anesthesia Type: General Level of consciousness: awake and alert Pain management: pain level controlled Vital Signs Assessment: post-procedure vital signs reviewed and stable Respiratory status: spontaneous breathing, nonlabored ventilation, respiratory function stable and patient connected to nasal cannula oxygen Cardiovascular status: blood pressure returned to baseline and stable Postop Assessment: no apparent nausea or vomiting Anesthetic complications: no   No notable events documented.  Last Vitals:  Vitals:   07/28/20 1130 07/28/20 1140  BP: 117/79 108/70  Pulse: 70 72  Resp: (!) 23 20  Temp:    SpO2: 99% 100%    Last Pain:  Vitals:   07/28/20 1140  TempSrc:   PainSc: 0-No pain                 Geovanie Winnett L Yonna Alwin

## 2020-07-28 NOTE — CV Procedure (Signed)
Procedure:   DCCV  Indication:  Symptomatic atrial fibrillation  Procedure Note:  The patient signed informed consent.  They have had had therapeutic anticoagulation with Eliquis greater than 3 weeks.  Anesthesia was administered by Dr. Lanetta Inch and Dewitt Hoes, CRNA.  Adequate airway was maintained throughout and vital followed per protocol.  They were cardioverted x 1 with 200J of biphasic synchronized energy.  They converted to NSR with PVCs, rate 70s.  There were no apparent complications.  The patient had normal neuro status and respiratory status post procedure with vitals stable as recorded elsewhere.    Follow up:  They will continue on current medical therapy and follow up with cardiology as scheduled.  Oswaldo Milian, MD 07/28/2020 11:15 AM

## 2020-07-28 NOTE — Interval H&P Note (Signed)
History and Physical Interval Note:  07/28/2020 10:57 AM  Bridget Cortez  has presented today for surgery, with the diagnosis of AFIB.  The various methods of treatment have been discussed with the patient and family. After consideration of risks, benefits and other options for treatment, the patient has consented to  Procedure(s): CARDIOVERSION (N/A) as a surgical intervention.  The patient's history has been reviewed, patient examined, no change in status, stable for surgery.  I have reviewed the patient's chart and labs.  Questions were answered to the patient's satisfaction.     Donato Heinz

## 2020-07-28 NOTE — Anesthesia Procedure Notes (Signed)
Date/Time: 07/28/2020 11:05 AM Performed by: Trinna Post., CRNA Pre-anesthesia Checklist: Patient identified, Emergency Drugs available, Suction available, Patient being monitored and Timeout performed Patient Re-evaluated:Patient Re-evaluated prior to induction Oxygen Delivery Method: Ambu bag Preoxygenation: Pre-oxygenation with 100% oxygen Induction Type: IV induction Placement Confirmation: positive ETCO2

## 2020-07-28 NOTE — Transfer of Care (Signed)
Immediate Anesthesia Transfer of Care Note  Patient: Bridget Cortez  Procedure(s) Performed: CARDIOVERSION  Patient Location: PACU and Endoscopy Unit  Anesthesia Type:General  Level of Consciousness: awake and drowsy  Airway & Oxygen Therapy: Patient Spontanous Breathing  Post-op Assessment: Report given to RN and Post -op Vital signs reviewed and stable  Post vital signs: Reviewed and stable  Last Vitals:  Vitals Value Taken Time  BP    Temp    Pulse    Resp    SpO2      Last Pain:  Vitals:   07/28/20 1052  TempSrc: Oral  PainSc: 0-No pain         Complications: No notable events documented.

## 2020-07-28 NOTE — Anesthesia Preprocedure Evaluation (Addendum)
Anesthesia Evaluation  Patient identified by MRN, date of birth, ID band Patient awake    Reviewed: Allergy & Precautions, NPO status , Patient's Chart, lab work & pertinent test results  History of Anesthesia Complications Negative for: history of anesthetic complications  Airway Mallampati: III  TM Distance: >3 FB Neck ROM: Full    Dental no notable dental hx. (+) Teeth Intact, Dental Advisory Given   Pulmonary asthma , former smoker,    Pulmonary exam normal breath sounds clear to auscultation       Cardiovascular hypertension, Pt. on medications and Pt. on home beta blockers Normal cardiovascular exam+ dysrhythmias Atrial Fibrillation and Supra Ventricular Tachycardia  Rhythm:Regular Rate:Normal  TTE 2019 - Left ventricle: The cavity size was normal. Wall thickness was  increased in a pattern of mild LVH. Systolic function was normal.  The estimated ejection fraction was in the range of 55% to 60%.  Wall motion was normal; there were no regional wall motion  abnormalities. Indeterminate diastolic function.  - Aortic valve: Mildly calcified annulus. Trileaflet; mildly  calcified leaflets.  - Mitral valve: Mildly calcified annulus. There was mild  regurgitation.  - Left atrium: The atrium was at the upper limits of normal in  size.  - Right atrium: The atrium was mildly dilated. Central venous  pressure (est): 3 mm Hg.  - Atrial septum: No defect or patent foramen ovale was identified.  - Tricuspid valve: There was mild regurgitation.  - Pulmonary arteries: PA peak pressure: 23 mm Hg (S).  - Pericardium, extracardiac: A small pericardial effusion was  identified posterior to the heart.    Neuro/Psych Chiari malformation    GI/Hepatic Neg liver ROS, GERD  ,  Endo/Other  negative endocrine ROS  Renal/GU negative Renal ROS  negative genitourinary   Musculoskeletal negative musculoskeletal ROS (+)    Abdominal   Peds  Hematology negative hematology ROS (+) Eliquis   Anesthesia Other Findings   Reproductive/Obstetrics                            Anesthesia Physical Anesthesia Plan  ASA: 3  Anesthesia Plan: General   Post-op Pain Management:    Induction: Intravenous  PONV Risk Score and Plan: 3 and Treatment may vary due to age or medical condition and Propofol infusion  Airway Management Planned: Mask  Additional Equipment: None  Intra-op Plan:   Post-operative Plan:   Informed Consent:   Plan Discussed with:   Anesthesia Plan Comments:        Anesthesia Quick Evaluation

## 2020-08-04 ENCOUNTER — Ambulatory Visit (HOSPITAL_COMMUNITY)
Admission: RE | Admit: 2020-08-04 | Discharge: 2020-08-04 | Disposition: A | Discharge status: Home / Self Care | Payer: Medicare Other | Source: Ambulatory Visit | Attending: Nurse Practitioner | Admitting: Nurse Practitioner

## 2020-08-04 ENCOUNTER — Other Ambulatory Visit: Payer: Self-pay

## 2020-08-04 ENCOUNTER — Encounter (HOSPITAL_COMMUNITY): Payer: Self-pay | Admitting: Nurse Practitioner

## 2020-08-04 VITALS — BP 132/72 | HR 79 | Ht 68.0 in | Wt 232.0 lb

## 2020-08-04 DIAGNOSIS — I110 Hypertensive heart disease with heart failure: Secondary | ICD-10-CM | POA: Insufficient documentation

## 2020-08-04 DIAGNOSIS — I4892 Unspecified atrial flutter: Secondary | ICD-10-CM | POA: Insufficient documentation

## 2020-08-04 DIAGNOSIS — Z7901 Long term (current) use of anticoagulants: Secondary | ICD-10-CM | POA: Diagnosis not present

## 2020-08-04 DIAGNOSIS — Z87891 Personal history of nicotine dependence: Secondary | ICD-10-CM | POA: Diagnosis not present

## 2020-08-04 DIAGNOSIS — I509 Heart failure, unspecified: Secondary | ICD-10-CM | POA: Insufficient documentation

## 2020-08-04 DIAGNOSIS — Z8249 Family history of ischemic heart disease and other diseases of the circulatory system: Secondary | ICD-10-CM | POA: Diagnosis not present

## 2020-08-04 DIAGNOSIS — D6869 Other thrombophilia: Secondary | ICD-10-CM | POA: Diagnosis not present

## 2020-08-04 LAB — CBC
HCT: 35.5 % — ABNORMAL LOW (ref 36.0–46.0)
Hemoglobin: 11.7 g/dL — ABNORMAL LOW (ref 12.0–15.0)
MCH: 28.3 pg (ref 26.0–34.0)
MCHC: 33 g/dL (ref 30.0–36.0)
MCV: 86 fL (ref 80.0–100.0)
Platelets: 229 10*3/uL (ref 150–400)
RBC: 4.13 MIL/uL (ref 3.87–5.11)
RDW: 15.4 % (ref 11.5–15.5)
WBC: 4.2 10*3/uL (ref 4.0–10.5)
nRBC: 0 % (ref 0.0–0.2)

## 2020-08-04 LAB — BASIC METABOLIC PANEL
Anion gap: 7 (ref 5–15)
BUN: 9 mg/dL (ref 8–23)
CO2: 25 mmol/L (ref 22–32)
Calcium: 8.9 mg/dL (ref 8.9–10.3)
Chloride: 98 mmol/L (ref 98–111)
Creatinine, Ser: 0.69 mg/dL (ref 0.44–1.00)
GFR, Estimated: >60 mL/min (ref >60)
Glucose, Bld: 87 mg/dL (ref 70–99)
Potassium: 3.9 mmol/L (ref 3.5–5.1)
Sodium: 130 mmol/L — ABNORMAL LOW (ref 135–145)

## 2020-08-04 NOTE — Progress Notes (Signed)
Primary Care Physician: [name], MD Referring Physician: Dr. Freddie Breech Bridget Cortez is a 75 y.o. female with a h/o SVT, HTN, HF, that is in the afib clinic f/u from Dr. Olin Pia visit from 06/18/20. At that time she was found to be in new onset atrial flutter and started on DOAC and continued with bisoprolol with reasonable rate control. She was fluid overloaded and started on Demadex 20 mg daily. She is now her to discuss cardioversion as she has been on anticoagulation for an appropriate period of time without any missed doses. Pt is very bright and well spoken. 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 She moved back here  with her husband to be near her daughter and grandchildren. She had her rt knee replaced last year and has been  having on going physical therapy, but has not had the energy to do so being out of rhythm.   Pt is here for f/u successful cardioversion, 07/28/20. EKG today shows SR. She feels improved. She is c/o of increased cramping in hands and feet. Taking good dose of K+ replacement.     Today, she denies symptoms of palpitations, chest pain, shortness of breath, orthopnea, PND, lower extremity edema, dizziness, presyncope, syncope, or neurologic sequela. The patient is tolerating medications without difficulties and is otherwise without complaint today.   Past Medical History:  Diagnosis Date   Abnormal Pap smear 2006   vaginal medicine according to patient   Allergy    Arthritis    Asthma    Atrophy of vagina 06/2005   Breast cancer ([place]) 1999   Left   CAP (community acquired pneumonia) 08/06/2014   Cataract    cataracts lt. eye    cataract removed.   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 eye aneurysm surgery Left 2017   MOUTH SURGERY  2019   rt. side gum surgery  Lt. done 3 years ago   RADIOACTIVE SEED GUIDED EXCISIONAL BREAST BIOPSY Right 07/09/2018   Procedure: RADIOACTIVE SEED GUIDED EXCISIONAL RIGHT  BREAST BIOPSY;  Surgeon: Stark Klein, MD;  Location: [facility];  Service: General;  Laterality: Right;   TOTAL KNEE ARTHROPLASTY Right 09/09/2019  Procedure: RIGHT TOTAL KNEE ARTHROPLASTY;  Surgeon: Melrose Nakayama, MD;  Location: [facility];  Service: Orthopedics;  Laterality: Right;    Current Outpatient Medications  Medication Sig Dispense Refill   acetaminophen (TYLENOL) 325 MG tablet Take 2 tablets (650 mg total) by mouth every 6 (six) hours as needed for mild pain, fever or headache (or Fever >/= 101).     albuterol (PROVENTIL) (5 MG/ML) 0.5% nebulizer solution Take 0.5 mLs (2.5 mg total) by nebulization every 6 (six) hours as needed for wheezing or shortness of breath. 20 mL 12   amLODipine (NORVASC) 10 MG tablet Take 5 mg by mouth at bedtime.      apixaban (ELIQUIS) 5 MG TABS tablet Take 1 tablet (5 mg total) by mouth 2 (two) times daily. 180 tablet 3   Calcium  Carbonate-Vitamin D (CALTRATE 600+D PO) Take 1 tablet by mouth daily with lunch.      cetirizine (ZYRTEC) 10 MG tablet Take 10 mg by mouth daily.     esomeprazole (NEXIUM) 40 MG capsule Take 40 mg by mouth 2 (two) times daily before a meal.      fluticasone (FLONASE) 50 MCG/ACT nasal spray Place 1 spray into both nostrils at bedtime.      Fluticasone-Salmeterol (ADVAIR) 250-50 MCG/DOSE AEPB Inhale 1 puff into the lungs every 12 (twelve) hours.     Multiple Vitamin (MULTIVITAMIN) tablet Take 1 tablet by mouth daily with lunch.      nebivolol (BYSTOLIC) 5 MG tablet Take 1 tablet by mouth once daily 90 tablet 3   potassium chloride SA (KLOR-CON) 20 MEQ tablet Take 20 mEq by mouth 3 (three) times daily.     rosuvastatin (CRESTOR) 5 MG tablet Take 5 mg by mouth daily.     simethicone (MYLICON) 607 MG chewable tablet Chew 125 mg by mouth every 6 (six) hours as needed for flatulence.     telmisartan (MICARDIS) 40 MG tablet Take 40 mg by mouth daily.     torsemide (DEMADEX) 20 MG tablet Take 1 tablet (20 mg total) by mouth daily. 90 tablet 3   triamcinolone cream (KENALOG) 0.1 % Apply 1 application topically daily as needed (Mold).     vitamin C (ASCORBIC ACID) 500 MG tablet Take 500 mg by mouth daily.     No current facility-administered medications for this encounter.    Allergies  Allergen Reactions   Ephedrine Shortness Of Breath   Cortisone Other (See Comments)    Severe muscle spasms    Social History   Socioeconomic History   Marital status: Married    Spouse name: Not on file   Number of children: 1   Years of education: Not on file   Highest education level: Not on file  Occupational History   Occupation: Retired Education officer, museum  Tobacco Use   Smoking status: Former    Pack years: 0.00    Types: Cigarettes    Quit date: 01/23/1978    Years since quitting: 42.5   Smokeless tobacco: Never  Vaping Use   Vaping Use: Never used  Substance and Sexual Activity   Alcohol use: No    Drug use: No   Sexual activity: Yes    Birth control/protection: Surgical  Other Topics Concern   Not on file  Social History Narrative   Not on file   Social Determinants of Health   Financial Resource Strain: Not on file  Food Insecurity: Not on file  Transportation Needs: Not on file  Physical Activity: Not on  file  Stress: Not on file  Social Connections: Not on file  Intimate Partner Violence: Not on file    Family History  Problem Relation Age of Onset   Cancer Mother    Heart failure Other    Colon cancer Neg Hx    Esophageal cancer Neg Hx    Rectal cancer Neg Hx    Stomach cancer Neg Hx    Colon polyps Neg Hx     ROS- All systems are reviewed and negative except as per the HPI above  Physical Exam: Vitals:   08/04/20 1337  Weight: 105.2 kg  Height: 5\' 8"  (1.727 m)   Wt Readings from Last 3 Encounters:  08/04/20 105.2 kg  07/28/20 103.4 kg  07/14/20 105.1 kg    Labs: Lab Results  Component Value Date   NA 131 (L) 07/14/2020   K 3.8 07/14/2020   CL 97 (L) 07/14/2020   CO2 26 07/14/2020   GLUCOSE 94 07/14/2020   BUN 10 07/14/2020   CREATININE 0.78 07/14/2020   CALCIUM 9.0 07/14/2020   MG 2.0 08/03/2017   Lab Results  Component Value Date   INR 1.0 09/03/2019   No results found for: CHOL, HDL, LDLCALC, TRIG   GEN- The patient is well appearing, alert and oriented x 3 today.   Head- normocephalic, atraumatic Eyes-  Sclera clear, conjunctiva pink Ears- hearing intact Oropharynx- clear Neck- supple, no JVP Lymph- no cervical lymphadenopathy Lungs- Clear to ausculation bilaterally, normal work of breathing Heart-  regular rate and rhythm, no murmurs, rubs or gallops, PMI not laterally displaced GI- soft, NT, ND, + BS Extremities- no clubbing, cyanosis, or edema MS- no significant deformity or atrophy Skin- no rash or lesion Psych- euthymic mood, full affect Neuro- strength and sensation are intact  EKG-afib at 103 bpm, qrs int 100 ms,  qtc 406 ms  Echo-Study Conclusions   - Left ventricle: The cavity size was normal. Wall thickness was    increased in a pattern of mild LVH. Systolic function was normal.    The estimated ejection fraction was in the range of 55% to 60%.    Wall motion was normal; there were no regional wall motion    abnormalities. Indeterminate diastolic function.  - Aortic valve: Mildly calcified annulus. Trileaflet; mildly    calcified leaflets.  - Mitral valve: Mildly calcified annulus. There was mild    regurgitation.  - Left atrium: The atrium was at the upper limits of normal in    size.  - Right atrium: The atrium was mildly dilated. Central venous    pressure (est): 3 mm Hg.  - Atrial septum: No defect or patent foramen ovale was identified.  - Tricuspid valve: There was mild regurgitation.  - Pulmonary arteries: PA peak pressure: 23 mm Hg (S).  - Pericardium, extracardiac: A small pericardial effusion was    identified posterior to the heart.    Assessment and Plan:  1. Atrial flutter  Successful CV and remains in SR  Continue  bisoprolol   2. CHA2DS2VASc  score of 5 States no missed doses of eliquis 5 mg bid since starting 06/18/20 Will check cbc/bmet with new start of anticoagulation and c/o cramping in hands and feet   3. HTN  Stable  4, HF Was started on demadex by Dr. Caryl Comes  Has lost several pounds,pt feels fluid status improved.   F/u with Dr. Caryl Comes in October  Afib clinic as needed    Geroge Baseman. Slaton Reaser, [facility]  [facility] [address] [place], [place] [address] [phone]

## 2020-08-19 ENCOUNTER — Ambulatory Visit (HOSPITAL_COMMUNITY)
Admission: RE | Admit: 2020-08-19 | Discharge: 2020-08-19 | Disposition: A | Discharge status: Home / Self Care | Payer: Medicare Other | Source: Ambulatory Visit | Attending: Physician Assistant | Admitting: Physician Assistant

## 2020-08-19 ENCOUNTER — Other Ambulatory Visit: Payer: Self-pay

## 2020-08-19 DIAGNOSIS — I484 Atypical atrial flutter: Secondary | ICD-10-CM | POA: Diagnosis not present

## 2020-08-19 LAB — CBC
HCT: 35.6 % — ABNORMAL LOW (ref 36.0–46.0)
Hemoglobin: 11.6 g/dL — ABNORMAL LOW (ref 12.0–15.0)
MCH: 28.1 pg (ref 26.0–34.0)
MCHC: 32.6 g/dL (ref 30.0–36.0)
MCV: 86.2 fL (ref 80.0–100.0)
Platelets: 238 10*3/uL (ref 150–400)
RBC: 4.13 MIL/uL (ref 3.87–5.11)
RDW: 15.4 % (ref 11.5–15.5)
WBC: 2.7 10*3/uL — ABNORMAL LOW (ref 4.0–10.5)
nRBC: 0 % (ref 0.0–0.2)

## 2020-08-30 DIAGNOSIS — Z20822 Contact with and (suspected) exposure to covid-19: Secondary | ICD-10-CM | POA: Diagnosis not present

## 2020-08-31 DIAGNOSIS — L304 Erythema intertrigo: Secondary | ICD-10-CM | POA: Diagnosis not present

## 2020-08-31 DIAGNOSIS — L821 Other seborrheic keratosis: Secondary | ICD-10-CM | POA: Diagnosis not present

## 2020-09-03 DIAGNOSIS — Z471 Aftercare following joint replacement surgery: Secondary | ICD-10-CM | POA: Diagnosis not present

## 2020-09-03 DIAGNOSIS — Z96651 Presence of right artificial knee joint: Secondary | ICD-10-CM | POA: Diagnosis not present

## 2020-09-06 ENCOUNTER — Other Ambulatory Visit (HOSPITAL_COMMUNITY): Payer: Self-pay

## 2020-09-06 ENCOUNTER — Telehealth: Payer: Self-pay | Admitting: Internal Medicine

## 2020-09-06 MED ORDER — APIXABAN 5 MG PO TABS: 5.0000 mg | ORAL_TABLET | Freq: Two times a day (BID) | ORAL | 0 refills | Status: DC

## 2020-09-06 NOTE — Telephone Encounter (Signed)
Received a new hem referral from Dr. Dagmar Hait for leukopenia. Bridget Cortez has been cld and scheduled to see Dr. Julien Nordmann on 8/24 at 11:45am w/labs at 11:15am. Pt aware to arrive 15 minutes early.

## 2020-09-14 ENCOUNTER — Other Ambulatory Visit: Payer: Self-pay | Admitting: Medical Oncology

## 2020-09-14 ENCOUNTER — Other Ambulatory Visit: Payer: Self-pay | Admitting: Internal Medicine

## 2020-09-14 DIAGNOSIS — D539 Nutritional anemia, unspecified: Secondary | ICD-10-CM

## 2020-09-15 ENCOUNTER — Inpatient Hospital Stay: Payer: Medicare Other

## 2020-09-15 ENCOUNTER — Inpatient Hospital Stay: Payer: Medicare Other | Attending: Internal Medicine | Admitting: Internal Medicine

## 2020-09-15 ENCOUNTER — Other Ambulatory Visit: Payer: Self-pay

## 2020-09-15 ENCOUNTER — Encounter: Payer: Self-pay | Admitting: Internal Medicine

## 2020-09-15 VITALS — BP 138/65 | HR 80 | Temp 97.7°F | Resp 19 | Ht 68.0 in | Wt 232.8 lb

## 2020-09-15 DIAGNOSIS — I10 Essential (primary) hypertension: Secondary | ICD-10-CM | POA: Diagnosis not present

## 2020-09-15 DIAGNOSIS — D702 Other drug-induced agranulocytosis: Secondary | ICD-10-CM

## 2020-09-15 DIAGNOSIS — D72819 Decreased white blood cell count, unspecified: Secondary | ICD-10-CM | POA: Insufficient documentation

## 2020-09-15 DIAGNOSIS — D539 Nutritional anemia, unspecified: Secondary | ICD-10-CM | POA: Diagnosis not present

## 2020-09-15 DIAGNOSIS — D509 Iron deficiency anemia, unspecified: Secondary | ICD-10-CM | POA: Insufficient documentation

## 2020-09-15 LAB — CMP (CANCER CENTER ONLY)
ALT: 14 U/L (ref 0–44)
AST: 18 U/L (ref 15–41)
Albumin: 3.6 g/dL (ref 3.5–5.0)
Alkaline Phosphatase: 87 U/L (ref 38–126)
Anion gap: 8 (ref 5–15)
BUN: 11 mg/dL (ref 8–23)
CO2: 26 mmol/L (ref 22–32)
Calcium: 9.2 mg/dL (ref 8.9–10.3)
Chloride: 101 mmol/L (ref 98–111)
Creatinine: 0.79 mg/dL (ref 0.44–1.00)
GFR, Estimated: >60 mL/min (ref >60)
Glucose, Bld: 67 mg/dL — ABNORMAL LOW (ref 70–99)
Potassium: 3.6 mmol/L (ref 3.5–5.1)
Sodium: 135 mmol/L (ref 135–145)
Total Bilirubin: 0.5 mg/dL (ref 0.3–1.2)
Total Protein: 6.9 g/dL (ref 6.5–8.1)

## 2020-09-15 LAB — FERRITIN: Ferritin: 110 ng/mL (ref 11–307)

## 2020-09-15 LAB — CBC WITH DIFFERENTIAL (CANCER CENTER ONLY)
Abs Immature Granulocytes: 0.01 10*3/uL (ref 0.00–0.07)
Basophils Absolute: 0 10*3/uL (ref 0.0–0.1)
Basophils Relative: 1 %
Eosinophils Absolute: 0.1 10*3/uL (ref 0.0–0.5)
Eosinophils Relative: 3 %
HCT: 34.5 % — ABNORMAL LOW (ref 36.0–46.0)
Hemoglobin: 11.4 g/dL — ABNORMAL LOW (ref 12.0–15.0)
Immature Granulocytes: 0 %
Lymphocytes Relative: 24 %
Lymphs Abs: 0.8 10*3/uL (ref 0.7–4.0)
MCH: 28.4 pg (ref 26.0–34.0)
MCHC: 33 g/dL (ref 30.0–36.0)
MCV: 86 fL (ref 80.0–100.0)
Monocytes Absolute: 0.4 10*3/uL (ref 0.1–1.0)
Monocytes Relative: 11 %
Neutro Abs: 2 10*3/uL (ref 1.7–7.7)
Neutrophils Relative %: 61 %
Platelet Count: 220 10*3/uL (ref 150–400)
RBC: 4.01 MIL/uL (ref 3.87–5.11)
RDW: 15.6 % — ABNORMAL HIGH (ref 11.5–15.5)
WBC Count: 3.4 10*3/uL — ABNORMAL LOW (ref 4.0–10.5)
nRBC: 0 % (ref 0.0–0.2)

## 2020-09-15 LAB — LACTATE DEHYDROGENASE: LDH: 133 U/L (ref 98–192)

## 2020-09-15 LAB — IRON AND TIBC
Iron: 56 ug/dL (ref 41–142)
Saturation Ratios: 22 % (ref 21–57)
TIBC: 259 ug/dL (ref 236–444)
UIBC: 203 ug/dL (ref 120–384)

## 2020-09-15 LAB — FOLATE: Folate: 36 ng/mL (ref >5.9)

## 2020-09-15 LAB — VITAMIN B12: Vitamin B-12: 627 pg/mL (ref 180–914)

## 2020-09-15 NOTE — Progress Notes (Signed)
[facility] Telephone:([phone]   [phone]  CONSULT NOTE  REFERRING PHYSICIAN: Dr. Berneta Sages  REASON FOR CONSULTATION:  75 years old white female with leukocytopenia.  HPI Bridget Cortez is a 75 y.o. female with past medical history significant for multiple medical problems including history of asthma, left breast cancer in 1999 status post lumpectomy with radiotherapy as well as treatment with tamoxifen followed by [facility] for 10 years.  The patient also has a history of GERD, hypertension, heart murmur, SVT and diverticulosis.  The patient mentioned that she had cardioversion on July 28, 2020 by Dr. Caryl Comes and she started treatment with Eliquis.  Her initial CBC on August 04, 2020 showed normal white blood count but the patient had hemoglobin of 11.7 and hematocrit 35.5 and normal platelets count.  Repeat CBC 2 weeks later showed decline in her total white blood count to 2.7 with persistent anemia and hemoglobin of 11.6 and hematocrit 35.6%.  The patient was advised to reach out to her primary care physician for evaluation and he referred her to [place] regarding this abnormality.  Reviewing her records I noticed that the patient had similar result with a total white blood count of 2.9 on April 17, 2020 and 2.8 on July 14, 2020.  She also has similar results at Dr. Danna Hefty office dating back to 2020.  Her total white blood count got worse after the patient had COVID infection in April 2022. When seen today she is feeling fine except for arthralgia especially on the right knee after her surgery in August 2021.  She also has mild fatigue.  She has no dizzy spells.  She denied having any chest pain, shortness of breath, cough or hemoptysis.  She denied having any bleeding, bruises or ecchymosis.  She has no nausea, vomiting, diarrhea or constipation.  She has no headache or visual changes. Family history significant for mother with uterine cancer and she was adopted and  does not know her father. The patient is married and has no biological children but she has adopted children.  She was accompanied today by her husband Bridget Cortez.  The patient used to work as a Radio producer.  She has a history for smoking but quit in 1973 and she has no history of alcohol or drug abuse.  HPI  Past Medical History:  Diagnosis Date   Abnormal Pap smear 2006   vaginal medicine according to patient   Allergy    Arthritis    Asthma    Atrophy of vagina 06/2005   Breast cancer ([place]) 1999   Left   CAP (community acquired pneumonia) 08/06/2014   Cataract    cataracts lt. eye    cataract removed.   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 eye aneurysm surgery Left 2017   MOUTH SURGERY  2019   rt. side gum surgery  Lt. done 3 years ago   RADIOACTIVE SEED GUIDED EXCISIONAL BREAST BIOPSY Right 07/09/2018   Procedure: RADIOACTIVE SEED GUIDED EXCISIONAL RIGHT  BREAST BIOPSY;  Surgeon: Stark Klein, MD;  Location: [facility];  Service: General;  Laterality: Right;    TOTAL KNEE ARTHROPLASTY Right 09/09/2019   Procedure: RIGHT TOTAL KNEE ARTHROPLASTY;  Surgeon: Melrose Nakayama, MD;  Location: [facility];  Service: Orthopedics;  Laterality: Right;    Family History  Problem Relation Age of Onset   Cancer Mother    Heart failure Other    Colon cancer Neg Hx    Esophageal cancer Neg Hx    Rectal cancer Neg Hx    Stomach cancer Neg Hx    Colon polyps Neg Hx     Social History Social History   Tobacco Use   Smoking status: Former    Types: Cigarettes    Quit date: 01/23/1978    Years since quitting: 42.6   Smokeless tobacco: Never  Vaping Use   Vaping Use: Never used  Substance Use Topics   Alcohol use: No   Drug use: No    Allergies  Allergen Reactions   Ephedrine Shortness Of Breath   Cortisone Other (See Comments)    Severe muscle spasms    Current Outpatient Medications  Medication Sig Dispense Refill   acetaminophen (TYLENOL) 325 MG tablet Take 2 tablets (650 mg total) by mouth every 6 (six) hours as needed for mild pain, fever or headache (or Fever >/= 101).     albuterol (PROVENTIL) (5 MG/ML) 0.5% nebulizer solution Take 0.5 mLs (2.5 mg total) by nebulization every 6 (six) hours as needed for wheezing or shortness of breath. 20 mL 12   amLODipine (NORVASC) 10 MG tablet Take 5 mg by mouth at bedtime.      apixaban (ELIQUIS) 5 MG TABS tablet Take 1 tablet (5 mg total) by mouth 2 (two) times daily. 14 tablet 0   Calcium Carbonate-Vitamin D (CALTRATE 600+D PO) Take 1 tablet by mouth daily with lunch.      cetirizine (ZYRTEC) 10 MG tablet Take 10 mg by mouth daily.     esomeprazole (NEXIUM) 40 MG capsule Take 40 mg by mouth 2 (two) times daily before a meal.      fluticasone (FLONASE) 50 MCG/ACT nasal spray Place 1 spray into both nostrils at bedtime.      Fluticasone-Salmeterol (ADVAIR) 250-50 MCG/DOSE AEPB Inhale 1 puff into the lungs every 12 (twelve) hours.     Multiple Vitamin (MULTIVITAMIN) tablet Take 1 tablet by mouth daily with  lunch.      nebivolol (BYSTOLIC) 5 MG tablet Take 1 tablet by mouth once daily 90 tablet 3   potassium chloride SA (KLOR-CON) 20 MEQ tablet Take 20 mEq by mouth 3 (three) times daily.     rosuvastatin (CRESTOR) 5 MG tablet Take 5 mg by mouth daily.     simethicone (MYLICON) 756 MG chewable tablet Chew 125 mg  by mouth every 6 (six) hours as needed for flatulence.     telmisartan (MICARDIS) 40 MG tablet Take 40 mg by mouth daily.     torsemide (DEMADEX) 20 MG tablet Take 1 tablet (20 mg total) by mouth daily. 90 tablet 3   traZODone (DESYREL) 50 MG tablet Take 25 mg by mouth at bedtime as needed for sleep.     triamcinolone cream (KENALOG) 0.1 % Apply 1 application topically daily as needed (Mold).     vitamin C (ASCORBIC ACID) 500 MG tablet Take 500 mg by mouth daily.     No current facility-administered medications for this visit.    Review of Systems  Constitutional: positive for fatigue Eyes: negative Ears, nose, mouth, throat, and face: negative Respiratory: negative Cardiovascular: negative Gastrointestinal: negative Genitourinary:negative Integument/breast: negative Hematologic/lymphatic: negative Musculoskeletal:positive for arthralgias Neurological: negative Behavioral/Psych: negative Endocrine: negative Allergic/Immunologic: negative  Physical Exam  TTS:VXBLT, healthy, no distress, well nourished, and well developed SKIN: skin color, texture, turgor are normal, no rashes or significant lesions HEAD: Normocephalic, No masses, lesions, tenderness or abnormalities EYES: normal, PERRLA, Conjunctiva are pink and non-injected EARS: External ears normal, Canals clear OROPHARYNX:no exudate, no erythema, and lips, buccal mucosa, and tongue normal  NECK: supple, no adenopathy, no JVD LYMPH:  no palpable lymphadenopathy, no hepatosplenomegaly BREAST:not examined LUNGS: clear to auscultation , and palpation HEART: regular rate & rhythm, no murmurs, and no  gallops ABDOMEN:abdomen soft, non-tender, obese, normal bowel sounds, and no masses or organomegaly BACK: No CVA tenderness, Range of motion is normal EXTREMITIES:no joint deformities, effusion, or inflammation, no edema  NEURO: alert & oriented x 3 with fluent speech, no focal motor/sensory deficits  PERFORMANCE STATUS: ECOG 1  LABORATORY DATA: Lab Results  Component Value Date   WBC 3.4 (L) 09/15/2020   HGB 11.4 (L) 09/15/2020   HCT 34.5 (L) 09/15/2020   MCV 86.0 09/15/2020   PLT 220 09/15/2020      Chemistry      Component Value Date/Time   NA 135 09/15/2020 1121   K 3.6 09/15/2020 1121   CL 101 09/15/2020 1121   CO2 26 09/15/2020 1121   BUN 11 09/15/2020 1121   CREATININE 0.79 09/15/2020 1121      Component Value Date/Time   CALCIUM 9.2 09/15/2020 1121   ALKPHOS 87 09/15/2020 1121   AST 18 09/15/2020 1121   ALT 14 09/15/2020 1121   BILITOT 0.5 09/15/2020 1121       RADIOGRAPHIC STUDIES: No results found.  ASSESSMENT: This is a very pleasant 75 years old African-American female presented for evaluation of leukocytopenia and mild anemia.  Her leukocytopenia is likely drug-induced from her current treatment rosuvastatin.  She also has mild anemia of chronic disease.     PLAN: I had a lengthy discussion with the patient and her husband today about her current condition and further investigation to identify the etiology of her anemia and leukocytopenia.  I ordered several studies today including repeat CBC which showed total white blood count of 3.4 with normal absolute neutrophil count of 2000.  The patient has mild anemia.  Her iron study and ferritin as well as vitamin B12 level and serum folate were normal. I think her anemia is anemia of chronic disease with mild iron deficiency and her leukocytopenia is drug-induced.  I do not see an urgent need to proceed with any additional studies including a bone marrow biopsy and aspirate at this point. I will arrange for the  patient to come back for  follow-up visit in around 3 months for evaluation of her condition and repeat blood work. If she continues to have significant cytopenias, I may consider her for the biopsy at that time. The patient will continue with her current medication for now as well as a routine follow-up visit by her primary care physician. She was advised to call immediately if she has any concerning symptoms in the interval.  The patient voices understanding of current disease status and treatment options and is in agreement with the current care plan.  All questions were answered. The patient knows to call the clinic with any problems, questions or concerns. We can certainly see the patient much sooner if necessary.  Thank you so much for allowing me to participate in the care of Bridget Cortez. I will continue to follow up the patient with you and assist in her care.  The total time spent in the appointment was 60 minutes.  Disclaimer: This note was dictated with voice recognition software. Similar sounding words can inadvertently be transcribed and may not be corrected upon review.   Eilleen Kempf September 15, 2020, 12:11 PM

## 2020-10-07 DIAGNOSIS — H40023 Open angle with borderline findings, high risk, bilateral: Secondary | ICD-10-CM | POA: Diagnosis not present

## 2020-10-07 DIAGNOSIS — H04123 Dry eye syndrome of bilateral lacrimal glands: Secondary | ICD-10-CM | POA: Diagnosis not present

## 2020-10-19 ENCOUNTER — Other Ambulatory Visit: Payer: Self-pay | Admitting: Obstetrics and Gynecology

## 2020-10-19 DIAGNOSIS — Z1231 Encounter for screening mammogram for malignant neoplasm of breast: Secondary | ICD-10-CM

## 2020-10-28 DIAGNOSIS — I4891 Unspecified atrial fibrillation: Secondary | ICD-10-CM | POA: Diagnosis not present

## 2020-10-28 DIAGNOSIS — F419 Anxiety disorder, unspecified: Secondary | ICD-10-CM | POA: Diagnosis not present

## 2020-10-28 DIAGNOSIS — Q078 Other specified congenital malformations of nervous system: Secondary | ICD-10-CM | POA: Diagnosis not present

## 2020-10-28 DIAGNOSIS — I10 Essential (primary) hypertension: Secondary | ICD-10-CM | POA: Diagnosis not present

## 2020-10-28 DIAGNOSIS — J45909 Unspecified asthma, uncomplicated: Secondary | ICD-10-CM | POA: Diagnosis not present

## 2020-10-28 DIAGNOSIS — H34219 Partial retinal artery occlusion, unspecified eye: Secondary | ICD-10-CM | POA: Diagnosis not present

## 2020-10-28 DIAGNOSIS — E782 Mixed hyperlipidemia: Secondary | ICD-10-CM | POA: Diagnosis not present

## 2020-10-28 DIAGNOSIS — I519 Heart disease, unspecified: Secondary | ICD-10-CM | POA: Diagnosis not present

## 2020-10-28 DIAGNOSIS — M25512 Pain in left shoulder: Secondary | ICD-10-CM | POA: Diagnosis not present

## 2020-10-28 DIAGNOSIS — M1990 Unspecified osteoarthritis, unspecified site: Secondary | ICD-10-CM | POA: Diagnosis not present

## 2020-10-28 DIAGNOSIS — D72819 Decreased white blood cell count, unspecified: Secondary | ICD-10-CM | POA: Diagnosis not present

## 2020-10-29 DIAGNOSIS — M19012 Primary osteoarthritis, left shoulder: Secondary | ICD-10-CM | POA: Diagnosis not present

## 2020-11-01 ENCOUNTER — Telehealth (HOSPITAL_COMMUNITY): Payer: Self-pay | Admitting: *Deleted

## 2020-11-01 MED ORDER — TORSEMIDE 20 MG PO TABS: 20.0000 mg | ORAL_TABLET | ORAL | 3 refills | Status: DC

## 2020-11-01 NOTE — Telephone Encounter (Signed)
Patient called in stating she wanted to know if she could reduce the amount of toresmide she is taking. Discussed with Adline Peals PA since weight has been stable since cardioversion can try every other day with daily weights to monitor for increase. Pt has follow up with Dr. Caryl Comes 10/31. She will call if issues prior to this.

## 2020-11-16 ENCOUNTER — Telehealth: Payer: Self-pay | Admitting: Internal Medicine

## 2020-11-16 NOTE — Telephone Encounter (Signed)
Spoke with pt who states she is not having any lightheadedness or dizziness.  She is now taking Torsemide every other day as prescribed by Tilda Franco and is doing better with the increase urination.  Pt's weight is stable.  Pt states she is concerned about having to take a blood thinner.  She reports she stays cold all of the time.  She would like to see if she might be a candidate for Watchman.  Pt advised will make Dr Caryl Comes aware and she can discuss with him at her 11/22/2020 appointment.  Pt verbalizes understanding and agrees with current plan.

## 2020-11-16 NOTE — Telephone Encounter (Signed)
Patient called and mentioned how she has an uncontrolled bladder where she has to urinate a lot and also experiencing lightheaded and dizziness

## 2020-11-18 DIAGNOSIS — M19012 Primary osteoarthritis, left shoulder: Secondary | ICD-10-CM | POA: Diagnosis not present

## 2020-11-20 DIAGNOSIS — Z23 Encounter for immunization: Secondary | ICD-10-CM | POA: Diagnosis not present

## 2020-11-22 ENCOUNTER — Encounter: Payer: Self-pay | Admitting: Internal Medicine

## 2020-11-22 ENCOUNTER — Ambulatory Visit (INDEPENDENT_AMBULATORY_CARE_PROVIDER_SITE_OTHER): Payer: Medicare Other | Admitting: Internal Medicine

## 2020-11-22 ENCOUNTER — Other Ambulatory Visit: Payer: Self-pay

## 2020-11-22 ENCOUNTER — Telehealth: Payer: Self-pay | Admitting: Internal Medicine

## 2020-11-22 VITALS — BP 122/72 | HR 93 | Ht 68.0 in | Wt 226.0 lb

## 2020-11-22 DIAGNOSIS — I471 Supraventricular tachycardia, unspecified: Secondary | ICD-10-CM

## 2020-11-22 MED ORDER — RIVAROXABAN 20 MG PO TABS: 20.0000 mg | ORAL_TABLET | Freq: Every day | ORAL | 11 refills | Status: DC

## 2020-11-22 NOTE — Patient Instructions (Addendum)
Medication Instructions:  Your physician has recommended you make the following change in your medication:   ** Begin taking Xarelto 20mg  - 1 tablet by mouth daily at supper. *If you need a refill on your cardiac medications before your next appointment, please call your pharmacy*   Lab Work: None ordered.  If you have labs (blood work) drawn today and your tests are completely normal, you will receive your results only by: [place] (if you have MyChart) OR A paper copy in the mail If you have any lab test that is abnormal or we need to change your treatment, we will call you to review the results.   Testing/Procedures: None ordered.    Follow-Up: At [facility], you and your health needs are our priority.  As part of our continuing mission to provide you with exceptional heart care, we have created designated Provider Care Teams.  These Care Teams include your primary Cardiologist (physician) and Advanced Practice Providers (APPs -  Physician Assistants and Nurse Practitioners) who all work together to provide you with the care you need, when you need it.  We recommend signing up for the patient portal called "MyChart".  Sign up information is provided on this After Visit Summary.  MyChart is used to connect with patients for Virtual Visits (Telemedicine).  Patients are able to view lab/test results, encounter notes, upcoming appointments, etc.  Non-urgent messages can be sent to your provider as well.   To learn more about what you can do with MyChart, go to [email or web address].    Your next appointment:   Please call in 1 month to let us know how you are tolerating the Xarelto and if the cold intolerance has improved. [phone] ask for Dr Olin Pia nurse.

## 2020-11-22 NOTE — Telephone Encounter (Signed)
Pt c/o medication issue:  1. Name of Medication: rivaroxaban (XARELTO) 20 MG TABS tablet  2. How are you currently taking this medication (dosage and times per day)? As directed  3. Are you having a reaction (difficulty breathing--STAT)? yes  4. What is your medication issue? Patient states that since taking this medication she has been tired, she wants to know if this is normal. She also states that she had a list of questions she needed to ask but forgot to at her appt today.

## 2020-11-22 NOTE — Progress Notes (Signed)
Patient Care Team: [name], MD as PCP - General (Internal Medicine) Burnell Blanks, MD as PCP - Cardiology (Cardiology)   HPI  Bridget Cortez is a 75 y.o. female Seen in followup for [facility] which had ECG features of VT (concordance//aVR) but initiation and termination sequences suggestive of SVT--GT agreed more likely SVT  Rx with amlodipine>> metoprolol      4th COVID Booster taken-Covid + April 2022. Post symptoms are shortness of breath. No exertional chest pain or discomfort. No palpitations.  Seen by her PCP and noted to be tachycardic.       Today, she is not doing well. Her osteoarthritis is inflamed with cysts and causing pain in her L shoulder. She has been been experiencing cold flashes which she attributes to Eliquis. She reports she feels the chills the most in her hands and feet. At night, her cold flashes may cause her to lose sleep.   She is not currently taking the torsemide because of urinary frequency.  Occasionally, she experiences LE swelling.    The patient denies chest pain nocturnal dyspnea, orthopnea.  There have been no palpitations, lightheadedness, or syncope.  Complains of cold flashes, LE edema, and some dyspnea on exertion   DATE TEST EF  8/16 Echo   55-65 %  7/19 Echo   55-60 %   Date Cr K Hgb  2/20 0.86 4.1 12.3  6/20  0.62 3.8   8/21 0.61 4.0 12.8    3/22 0.54 3.7 12.6  8/22 0.79 3.6 11.4   Anemia w/u ferritin 110, Fe Sat 22%; eval by Heme-Onc    Past Medical History:  Diagnosis Date   Abnormal Pap smear 2006   vaginal medicine according to patient   Allergy    Arthritis    Asthma    Atrophy of vagina 06/2005   Breast cancer ([place]) 1999   Left   CAP (community acquired pneumonia) 08/06/2014   Cataract    cataracts lt. eye    cataract removed.   Chiari malformation    Diverticulosis    Dysrhythmia    SVT   GERD (gastroesophageal reflux disease)    H/O osteopenia    08/2006   H/O varicella     Heart murmur    Hypertension    Monilial vaginitis 07/2007   Multiple allergies    Ovarian cyst    Personal history of colonic adenomas 03/13/2012   Personal history of radiation therapy 1990   Pneumonia 07/2014   VIN III (vulvar intraepithelial neoplasia III) 03/2009   Yeast infection     Past Surgical History:  Procedure Laterality Date   ABDOMINAL HYSTERECTOMY     ABDOMINAL SURGERY     BREAST EXCISIONAL BIOPSY Right    benign   BREAST LUMPECTOMY Left 1990   BREAST SURGERY     Lumpectomy, left breast   CARDIOVERSION N/A 07/28/2020   Procedure: CARDIOVERSION;  Surgeon: Donato Heinz, MD;  Location: [place];  Service: Cardiovascular;  Laterality: N/A;   CATARACT EXTRACTION Left 2018   COLONOSCOPY     COLONOSCOPY W/ BIOPSIES     EYE SURGERY     HERNIA REPAIR     KNEE SURGERY     right   lt. eye aneurysm surgery Left 2017   MOUTH SURGERY  2019   rt. side gum surgery  Lt. done 3 years ago   [place] EXCISIONAL BREAST BIOPSY Right 07/09/2018   Procedure: RADIOACTIVE SEED GUIDED EXCISIONAL  RIGHT  BREAST BIOPSY;  Surgeon: Stark Jalaysha Skilton, MD;  Location: [place];  Service: General;  Laterality: Right;   TOTAL KNEE ARTHROPLASTY Right 09/09/2019   Procedure: RIGHT TOTAL KNEE ARTHROPLASTY;  Surgeon: Melrose Nakayama, MD;  Location: [facility];  Service: Orthopedics;  Laterality: Right;    Current Meds  Medication Sig   acetaminophen (TYLENOL) 325 MG tablet Take 2 tablets (650 mg total) by mouth every 6 (six) hours as needed for mild pain, fever or headache (or Fever >/= 101).   albuterol (PROVENTIL) (5 MG/ML) 0.5% nebulizer solution Take 0.5 mLs (2.5 mg total) by nebulization every 6 (six) hours as needed for wheezing or shortness of breath.   amLODipine (NORVASC) 10 MG tablet Take 5 mg by mouth at bedtime.    apixaban (ELIQUIS) 5 MG TABS tablet Take 1 tablet (5 mg total) by mouth 2 (two) times daily.   Calcium Carbonate-Vitamin D (CALTRATE 600+D PO) Take 1 tablet by  mouth daily with lunch.    cetirizine (ZYRTEC) 10 MG tablet Take 10 mg by mouth daily.   esomeprazole (NEXIUM) 40 MG capsule Take 40 mg by mouth 2 (two) times daily before a meal.    fluticasone (FLONASE) 50 MCG/ACT nasal spray Place 1 spray into both nostrils at bedtime.    Fluticasone-Salmeterol (ADVAIR) 250-50 MCG/DOSE AEPB Inhale 1 puff into the lungs every 12 (twelve) hours.   Multiple Vitamin (MULTIVITAMIN) tablet Take 1 tablet by mouth daily with lunch.    nebivolol (BYSTOLIC) 5 MG tablet Take 1 tablet by mouth once daily   potassium chloride SA (KLOR-CON) 20 MEQ tablet Take 20 mEq by mouth 3 (three) times daily.   rosuvastatin (CRESTOR) 5 MG tablet Take 5 mg by mouth daily.   simethicone (MYLICON) 144 MG chewable tablet Chew 125 mg by mouth every 6 (six) hours as needed for flatulence.   telmisartan (MICARDIS) 40 MG tablet Take 40 mg by mouth daily.   torsemide (DEMADEX) 20 MG tablet Take 1 tablet (20 mg total) by mouth every other day.   traMADol (ULTRAM) 50 MG tablet Take 50 mg by mouth as needed.   traZODone (DESYREL) 50 MG tablet Take 25 mg by mouth at bedtime as needed for sleep.   triamcinolone cream (KENALOG) 0.1 % Apply 1 application topically daily as needed ([place]).   vitamin C (ASCORBIC ACID) 500 MG tablet Take 500 mg by mouth daily.    Allergies  Allergen Reactions   Ephedrine Shortness Of Breath   Cortisone Other (See Comments)    Severe muscle spasms    Review of Systems negative except from HPI and PMH BP 122/72   Pulse 93   Ht 5\' 8"  (1.727 m)   Wt 226 lb (102.5 kg)   SpO2 96%   BMI 34.36 kg/m   Well developed and nourished in no acute distress HENT normal Neck supple with JVP-  flat  Clear Regular rate and rhythm, no murmurs or gallops Abd-soft with active BS No Clubbing cyanosis edema flat feet Skin-warm and dry A & Oriented  Grossly normal sensory and motor function  ECG sinus  @ 93 17/09/35   Assessment and  Plan  WCT probably SVT  Atrial  flutter-2: 1 conduction  HTN  HFpEF chronic  Anxiety  Cold intolerance  Dyspnea   Pt heart failure status is stable. Continue torsemide 20 mg will decrease to 2 times a week.  Electrolytes are stable.   No interval atrial fibrillation.  Anticoagulation is with Eliquis; she is having significant  cold intolerance.  We will try her on Xarelto and or dabigatran.  We also broached the topic of watchman as an alternative to anticoagulation.  She is concerned about the possibility of a cardiac procedure.  Functional status is much improved with sinus rhythm with loss of edema and less dyspnea.  Blood pressure is reasonably controlled.  We will continue her on amlodipine 5  Bystolic 5 Micardis 40       I,Mykaella Javier,acting as a scribe for Virl Axe, MD.,have documented all relevant documentation on the behalf of Virl Axe, MD,as directed by  Virl Axe, MD while in the presence of Virl Axe, MD.  I, Virl Axe, MD, have reviewed all documentation for this visit. The documentation on 11/22/20 for the exam, diagnosis, procedures, and orders are all accurate and complete.

## 2020-11-22 NOTE — Progress Notes (Deleted)
Patient Care Team: [name], MD as PCP - General (Internal Medicine) Burnell Blanks, MD as PCP - Cardiology (Cardiology)   HPI  Bridget Cortez is a 75 y.o. female Seen in followup for [facility] which had ECG features of VT (concordance//aVR) but initiation and termination sequences suggestive of SVT--GT agreed more likely SVT  Rx with amlodipine>> metoprolol  Continues with occasional palpitations typically short duration 2-3 minutes.  Relieved with sitting down.  Underwent knee replacement 8/21 now with much less pain.  Swelling no and physical therapy is hard.     The patient denies lightheadedness or syncope.     4th COVID Booster taken-Covid + April 2022. Post symptoms are shortness of breath. No exertional chest pain or discomfort. No palpitations.  Seen by her PCP and noted to be tachycardic.  Long standing peripheral edema.  Although she says it is improved  Worsening orthopnea Sleeps on 3-4 high pillows secondary to her coughing at night. No nocturnal dyspnea or orthopnea.    The patient denies chest pain,, nocturnal dyspnea***, orthopnea*** or peripheral edema***.  There have been no palpitations***, lightheadedness*** or syncope***.  Complains of ***.    DATE TEST EF   8/16 Echo   55-65 %   7/19 Echo   55-60 %    Date Cr K Hgb  2/20 0.86 4.1 12.3  6/20  0.62 3.8   8/21 0.61 4.0 12.8    3/22 0.54 3.7 12.6  8/22 0.79 3.6 11.4   Anemia w/u ferritin 110, Fe Sat 22%; eval by Heme-Onc    Past Medical History:  Diagnosis Date   Abnormal Pap smear 2006   vaginal medicine according to patient   Allergy    Arthritis    Asthma    Atrophy of vagina 06/2005   Breast cancer ([place]) 1999   Left   CAP (community acquired pneumonia) 08/06/2014   Cataract    cataracts lt. eye    cataract removed.   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 eye aneurysm surgery Left 2017   MOUTH SURGERY  2019   rt. side gum surgery  Lt. done 3 years ago   [place] EXCISIONAL BREAST BIOPSY Right 07/09/2018   Procedure: RADIOACTIVE SEED GUIDED EXCISIONAL RIGHT  BREAST BIOPSY;  Surgeon: Stark Adriano Bischof,  MD;  Location: [place];  Service: General;  Laterality: Right;   TOTAL KNEE ARTHROPLASTY Right 09/09/2019   Procedure: RIGHT TOTAL KNEE ARTHROPLASTY;  Surgeon: Mel[place] Nakayama, MD;  Location: [facility];  Service: Orthopedics;  Laterality: Right;    Current Meds  Medication Sig   acetaminophen (TYLENOL) 325 MG tablet Take 2 tablets (650 mg total) by mouth every 6 (six) hours as needed for mild pain, fever or headache (or Fever >/= 101).   albuterol (PROVENTIL) (5 MG/ML) 0.5% nebulizer solution Take 0.5 mLs (2.5 mg total) by nebulization every 6 (six) hours as needed for wheezing or shortness of breath.   amLODipine (NORVASC) 10 MG tablet Take 5 mg by mouth at bedtime.    apixaban (ELIQUIS) 5 MG TABS tablet Take 1 tablet (5 mg total) by mouth 2 (two) times daily.   Calcium Carbonate-Vitamin D (CALTRATE 600+D PO) Take  1 tablet by mouth daily with lunch.    cetirizine (ZYRTEC) 10 MG tablet Take 10 mg by mouth daily.   esomeprazole (NEXIUM) 40 MG capsule Take 40 mg by mouth 2 (two) times daily before a meal.    fluticasone (FLONASE) 50 MCG/ACT nasal spray Place 1 spray into both nostrils at bedtime.    Fluticasone-Salmeterol (ADVAIR) 250-50 MCG/DOSE AEPB Inhale 1 puff into the lungs every 12 (twelve) hours.   Multiple Vitamin (MULTIVITAMIN) tablet Take 1 tablet by mouth daily with lunch.    nebivolol (BYSTOLIC) 5 MG tablet Take 1 tablet by mouth once daily   potassium chloride SA (KLOR-CON) 20 MEQ tablet Take 20 mEq by mouth 3 (three) times daily.   rosuvastatin (CRESTOR) 5 MG tablet Take 5 mg by mouth daily.   simethicone (MYLICON) 106 MG chewable tablet Chew 125 mg by mouth every 6 (six) hours as needed for flatulence.   telmisartan (MICARDIS) 40 MG tablet Take 40 mg by mouth daily.   torsemide (DEMADEX) 20 MG tablet Take 1 tablet (20 mg total) by mouth every other day.   traMADol (ULTRAM) 50 MG tablet Take 50 mg by mouth as needed.   traZODone (DESYREL) 50 MG tablet Take 25 mg by mouth at bedtime as needed for sleep.   triamcinolone cream (KENALOG) 0.1 % Apply 1 application topically daily as needed ([place]).   vitamin C (ASCORBIC ACID) 500 MG tablet Take 500 mg by mouth daily.    Allergies  Allergen Reactions   Ephedrine Shortness Of Breath   Cortisone Other (See Comments)    Severe muscle spasms    Review of Systems negative except from HPI and PMH BP 122/72   Pulse 93   Ht 5\' 8"  (1.727 m)   Wt 226 lb (102.5 kg)   SpO2 96%   BMI 34.36 kg/m   Well developed and nourished in no acute distress HENT normal Neck supple with JVP-  flat *** Clear Regular rate and rhythm, no murmurs or gallops Abd-soft with active BS No Clubbing cyanosis edema Skin-warm and dry A & Oriented  Grossly normal sensory and motor function  ECG ***    Assessment and  Plan  WCT probably SVT  Atrial flutter-2: 1  conduction  HTN  HFpEF acute/chronic   Tachy palpitations with worsening shortness of breath and atrial flutter with rapid conduction.  Heart rate control will be difficult; hence, we will proceed towards cardioversion.  We will discontinue aspirin and begin her on Eliquis 5 mg twice daily.  Have reviewed risks and benefits compared to aspirin.  Discussed the physiology of thromboembolism and  that anticoagulation would be lifelong  She is volume overloaded.  She takes high-dose potassium for cramps; appears not to be on a diuretic.  There are some questions about her medications however, we will plan to begin her on Demadex 20 mg daily  Her blood pressure is reasonably controlled on her multiple medications which include amlodipine, bisoprolol and some unknown medication still to be identified we will continue these.

## 2020-11-23 DIAGNOSIS — M19012 Primary osteoarthritis, left shoulder: Secondary | ICD-10-CM | POA: Diagnosis not present

## 2020-11-23 NOTE — Telephone Encounter (Signed)
Spoke with pt who is asking if she may take Tylenol with Xarelto.  Pt advised she may take Tylenol but not NSAIDS with Xarelto.  Pt states she will call RN at the end of the month to discuss how she is feeling and if she has had improvement in her fatigue and cold intolerance.  Pt thanked Therapist, sports for the callback.

## 2020-11-24 ENCOUNTER — Ambulatory Visit
Admission: RE | Admit: 2020-11-24 | Discharge: 2020-11-24 | Disposition: A | Discharge status: Home / Self Care | Payer: Medicare Other | Source: Ambulatory Visit | Attending: Obstetrics and Gynecology | Admitting: Obstetrics and Gynecology

## 2020-11-24 ENCOUNTER — Other Ambulatory Visit: Payer: Self-pay

## 2020-11-24 DIAGNOSIS — Z1231 Encounter for screening mammogram for malignant neoplasm of breast: Secondary | ICD-10-CM

## 2020-11-25 DIAGNOSIS — M19012 Primary osteoarthritis, left shoulder: Secondary | ICD-10-CM | POA: Diagnosis not present

## 2020-11-29 DIAGNOSIS — M19012 Primary osteoarthritis, left shoulder: Secondary | ICD-10-CM | POA: Diagnosis not present

## 2020-11-30 DIAGNOSIS — Z23 Encounter for immunization: Secondary | ICD-10-CM | POA: Diagnosis not present

## 2020-12-02 DIAGNOSIS — M19012 Primary osteoarthritis, left shoulder: Secondary | ICD-10-CM | POA: Diagnosis not present

## 2020-12-07 DIAGNOSIS — M19012 Primary osteoarthritis, left shoulder: Secondary | ICD-10-CM | POA: Diagnosis not present

## 2020-12-08 DIAGNOSIS — I4891 Unspecified atrial fibrillation: Secondary | ICD-10-CM | POA: Diagnosis not present

## 2020-12-08 DIAGNOSIS — I519 Heart disease, unspecified: Secondary | ICD-10-CM | POA: Diagnosis not present

## 2020-12-08 DIAGNOSIS — M1990 Unspecified osteoarthritis, unspecified site: Secondary | ICD-10-CM | POA: Diagnosis not present

## 2020-12-08 DIAGNOSIS — M25512 Pain in left shoulder: Secondary | ICD-10-CM | POA: Diagnosis not present

## 2020-12-08 DIAGNOSIS — R269 Unspecified abnormalities of gait and mobility: Secondary | ICD-10-CM | POA: Diagnosis not present

## 2020-12-10 DIAGNOSIS — M19012 Primary osteoarthritis, left shoulder: Secondary | ICD-10-CM | POA: Diagnosis not present

## 2020-12-14 ENCOUNTER — Telehealth: Payer: Self-pay | Admitting: Internal Medicine

## 2020-12-14 DIAGNOSIS — M19012 Primary osteoarthritis, left shoulder: Secondary | ICD-10-CM | POA: Diagnosis not present

## 2020-12-14 DIAGNOSIS — I4892 Unspecified atrial flutter: Secondary | ICD-10-CM

## 2020-12-14 MED ORDER — RIVAROXABAN 20 MG PO TABS: 20.0000 mg | ORAL_TABLET | Freq: Every day | ORAL | 1 refills | Status: DC

## 2020-12-14 NOTE — Telephone Encounter (Addendum)
Xarelto 20mg  refill request received per previous message. Patient is 75 years old, weight-102.5kg, Crea-0.79 on 09/15/2020, CrCl-99.81ml/min, Diagnosis-Aflutter, and last seen by Dr. Caryl Comes on 11/22/2020. Dose is appropriate based on dosing criteria. Will send in refill to requested pharmacy.    Sent in a 90 day supply then reread the message and pt only wants a 30 day supply; called the pharmacy and she stated they can set it for 30 day supply with refills; she will change and did not need another prescription. Advised pt really only wants 30 day supply and not 90 days& she confirmed she wouild change.

## 2020-12-14 NOTE — Telephone Encounter (Signed)
*  STAT* If patient is at the pharmacy, call can be transferred to refill team.   1. Which medications need to be refilled? (please list name of each medication and dose if known)  rivaroxaban (XARELTO) 20 MG TABS tablet  2. Which pharmacy/location (including street and city if local pharmacy) is medication to be sent to?  [place], [place].  3. Do they need a 30 day or 90 day supply? 90   Patient would also like a call back from Dr. Olin Pia nurse, Rosann Auerbach

## 2020-12-14 NOTE — Telephone Encounter (Signed)
Spoke with pt who reports she is doing well on Xarelto and will need a refill as she has opened her las bottle of samples.  She is requesting a 30 day supply until she is certain she will tolerate the medication without additional problems. Will forward to our anticoagulation clinic for refill.  Pt verbalizes understanding and agrees with current plan.

## 2020-12-15 ENCOUNTER — Encounter: Payer: Self-pay | Admitting: Internal Medicine

## 2020-12-15 ENCOUNTER — Other Ambulatory Visit: Payer: Self-pay

## 2020-12-15 ENCOUNTER — Inpatient Hospital Stay: Payer: Medicare Other | Attending: Internal Medicine

## 2020-12-15 ENCOUNTER — Inpatient Hospital Stay (HOSPITAL_BASED_OUTPATIENT_CLINIC_OR_DEPARTMENT_OTHER): Payer: Medicare Other | Admitting: Internal Medicine

## 2020-12-15 VITALS — BP 138/79 | HR 80 | Temp 98.7°F | Resp 17 | Ht 68.0 in | Wt 227.6 lb

## 2020-12-15 DIAGNOSIS — D539 Nutritional anemia, unspecified: Secondary | ICD-10-CM

## 2020-12-15 DIAGNOSIS — D72819 Decreased white blood cell count, unspecified: Secondary | ICD-10-CM

## 2020-12-15 DIAGNOSIS — Z923 Personal history of irradiation: Secondary | ICD-10-CM | POA: Diagnosis not present

## 2020-12-15 DIAGNOSIS — D509 Iron deficiency anemia, unspecified: Secondary | ICD-10-CM | POA: Insufficient documentation

## 2020-12-15 DIAGNOSIS — I10 Essential (primary) hypertension: Secondary | ICD-10-CM | POA: Insufficient documentation

## 2020-12-15 DIAGNOSIS — R5383 Other fatigue: Secondary | ICD-10-CM | POA: Insufficient documentation

## 2020-12-15 DIAGNOSIS — Z79899 Other long term (current) drug therapy: Secondary | ICD-10-CM | POA: Insufficient documentation

## 2020-12-15 DIAGNOSIS — Z7901 Long term (current) use of anticoagulants: Secondary | ICD-10-CM | POA: Insufficient documentation

## 2020-12-15 DIAGNOSIS — K219 Gastro-esophageal reflux disease without esophagitis: Secondary | ICD-10-CM | POA: Diagnosis not present

## 2020-12-15 DIAGNOSIS — M8580 Other specified disorders of bone density and structure, unspecified site: Secondary | ICD-10-CM | POA: Insufficient documentation

## 2020-12-15 LAB — CBC WITH DIFFERENTIAL (CANCER CENTER ONLY)
Abs Immature Granulocytes: 0.02 10*3/uL (ref 0.00–0.07)
Basophils Absolute: 0 10*3/uL (ref 0.0–0.1)
Basophils Relative: 1 %
Eosinophils Absolute: 0.1 10*3/uL (ref 0.0–0.5)
Eosinophils Relative: 2 %
HCT: 30.9 % — ABNORMAL LOW (ref 36.0–46.0)
Hemoglobin: 9.8 g/dL — ABNORMAL LOW (ref 12.0–15.0)
Immature Granulocytes: 1 %
Lymphocytes Relative: 34 %
Lymphs Abs: 0.9 10*3/uL (ref 0.7–4.0)
MCH: 26.6 pg (ref 26.0–34.0)
MCHC: 31.7 g/dL (ref 30.0–36.0)
MCV: 84 fL (ref 80.0–100.0)
Monocytes Absolute: 0.4 10*3/uL (ref 0.1–1.0)
Monocytes Relative: 14 %
Neutro Abs: 1.3 10*3/uL — ABNORMAL LOW (ref 1.7–7.7)
Neutrophils Relative %: 48 %
Platelet Count: 225 10*3/uL (ref 150–400)
RBC: 3.68 MIL/uL — ABNORMAL LOW (ref 3.87–5.11)
RDW: 16.5 % — ABNORMAL HIGH (ref 11.5–15.5)
WBC Count: 2.6 10*3/uL — ABNORMAL LOW (ref 4.0–10.5)
nRBC: 0 % (ref 0.0–0.2)

## 2020-12-15 LAB — IRON AND TIBC
Iron: 44 ug/dL (ref 41–142)
Saturation Ratios: 19 % — ABNORMAL LOW (ref 21–57)
TIBC: 228 ug/dL — ABNORMAL LOW (ref 236–444)
UIBC: 184 ug/dL (ref 120–384)

## 2020-12-15 LAB — FERRITIN: Ferritin: 174 ng/mL (ref 11–307)

## 2020-12-15 NOTE — Progress Notes (Signed)
[facility] [phone]   [phone]  OFFICE PROGRESS NOTE  [name], MD [place] [address]  DIAGNOSIS:  Leukocytopenia and mild anemia.  Her leukocytopenia is likely drug-induced from her current treatment rosuvastatin.  She also has mild anemia of chronic disease.    PRIOR THERAPY:None  CURRENT THERAPY: None  INTERVAL HISTORY: Bridget Cortez 75 y.o. female returns to the clinic today for follow-up visit accompanied by her husband.  The patient is feeling fine today with no concerning complaints except for mild fatigue.  Her cardiologist changed her anticoagulation from Eliquis to Xarelto.  She is doing a little bit better on Xarelto.  She denied having any chest pain, shortness of breath, cough or hemoptysis.  She denied having any fever or chills.  She has no nausea, vomiting, diarrhea or constipation.  She is here today for evaluation with repeat CBC, iron study and ferritin.  MEDICAL HISTORY: Past Medical History:  Diagnosis Date   Abnormal Pap smear 2006   vaginal medicine according to patient   Allergy    Arthritis    Asthma    Atrophy of vagina 06/2005   Breast cancer ([place]) 1999   Left   CAP (community acquired pneumonia) 08/06/2014   Cataract    cataracts lt. eye    cataract removed.   Chiari malformation    Diverticulosis    Dysrhythmia    SVT   GERD (gastroesophageal reflux disease)    H/O osteopenia    08/2006   H/O varicella    Heart murmur    Hypertension    Monilial vaginitis 07/2007   Multiple allergies    Ovarian cyst    Personal history of colonic adenomas 03/13/2012   Personal history of radiation therapy 1990   Pneumonia 07/2014   VIN III (vulvar intraepithelial neoplasia III) 03/2009   Yeast infection     ALLERGIES:  is allergic to ephedrine and cortisone.  MEDICATIONS:  Current Outpatient Medications  Medication Sig Dispense Refill   acetaminophen (TYLENOL) 325 MG  tablet Take 2 tablets (650 mg total) by mouth every 6 (six) hours as needed for mild pain, fever or headache (or Fever >/= 101).     albuterol (PROVENTIL) (5 MG/ML) 0.5% nebulizer solution Take 0.5 mLs (2.5 mg total) by nebulization every 6 (six) hours as needed for wheezing or shortness of breath. 20 mL 12   amLODipine (NORVASC) 10 MG tablet Take 5 mg by mouth at bedtime.      Calcium Carbonate-Vitamin D (CALTRATE 600+D PO) Take 1 tablet by mouth daily with lunch.      cetirizine (ZYRTEC) 10 MG tablet Take 10 mg by mouth daily.     esomeprazole (NEXIUM) 40 MG capsule Take 40 mg by mouth 2 (two) times daily before a meal.      fluticasone (FLONASE) 50 MCG/ACT nasal spray Place 1 spray into both nostrils at bedtime.      Fluticasone-Salmeterol (ADVAIR) 250-50 MCG/DOSE AEPB Inhale 1 puff into the lungs every 12 (twelve) hours.     Multiple Vitamin (MULTIVITAMIN) tablet Take 1 tablet by mouth daily with lunch.      nebivolol (BYSTOLIC) 5 MG tablet Take 1 tablet by mouth once daily 90 tablet 3   potassium chloride SA (KLOR-CON) 20 MEQ tablet Take 20 mEq by mouth 3 (three) times daily.     rivaroxaban (XARELTO) 20 MG TABS tablet Take 1 tablet (20 mg total) by mouth daily with supper.  90 tablet 1   rosuvastatin (CRESTOR) 5 MG tablet Take 5 mg by mouth daily.     simethicone (MYLICON) 315 MG chewable tablet Chew 125 mg by mouth every 6 (six) hours as needed for flatulence.     telmisartan (MICARDIS) 40 MG tablet Take 40 mg by mouth daily.     torsemide (DEMADEX) 20 MG tablet Take 1 tablet (20 mg total) by mouth every other day. 90 tablet 3   traMADol (ULTRAM) 50 MG tablet Take 50 mg by mouth as needed.     traZODone (DESYREL) 50 MG tablet Take 25 mg by mouth at bedtime as needed for sleep.     triamcinolone cream (KENALOG) 0.1 % Apply 1 application topically daily as needed ([place]).     vitamin C (ASCORBIC ACID) 500 MG tablet Take 500 mg by mouth daily.     No current facility-administered medications  for this visit.    SURGICAL HISTORY:  Past Surgical History:  Procedure Laterality Date   ABDOMINAL HYSTERECTOMY     ABDOMINAL SURGERY     BREAST EXCISIONAL BIOPSY Right    benign   BREAST LUMPECTOMY Left 1990   BREAST SURGERY     Lumpectomy, left breast   CARDIOVERSION N/A 07/28/2020   Procedure: CARDIOVERSION;  Surgeon: Donato Heinz, MD;  Location: [facility];  Service: Cardiovascular;  Laterality: N/A;   CATARACT EXTRACTION Left 2018   COLONOSCOPY     COLONOSCOPY W/ BIOPSIES     EYE SURGERY     HERNIA REPAIR     KNEE SURGERY     right   lt. eye aneurysm surgery Left 2017   MOUTH SURGERY  2019   rt. side gum surgery  Lt. done 3 years ago   RADIOACTIVE SEED GUIDED EXCISIONAL BREAST BIOPSY Right 07/09/2018   Procedure: RADIOACTIVE SEED GUIDED EXCISIONAL RIGHT  BREAST BIOPSY;  Surgeon: Stark Klein, MD;  Location: [facility];  Service: General;  Laterality: Right;   TOTAL KNEE ARTHROPLASTY Right 09/09/2019   Procedure: RIGHT TOTAL KNEE ARTHROPLASTY;  Surgeon: Melrose Nakayama, MD;  Location: [facility];  Service: Orthopedics;  Laterality: Right;    REVIEW OF SYSTEMS:  A comprehensive review of systems was negative except for: Constitutional: positive for fatigue   PHYSICAL EXAMINATION: General appearance: alert, cooperative, fatigued, and no distress Head: Normocephalic, without obvious abnormality, atraumatic Neck: no adenopathy, no JVD, supple, symmetrical, trachea midline, and thyroid not enlarged, symmetric, no tenderness/mass/nodules Lymph nodes: Cervical, supraclavicular, and axillary nodes normal. Resp: clear to auscultation bilaterally Back: symmetric, no curvature. ROM normal. No CVA tenderness. Cardio: regular rate and rhythm, S1, S2 normal, no murmur, click, rub or gallop GI: soft, non-tender; bowel sounds normal; no masses,  no organomegaly Extremities: extremities normal, atraumatic, no cyanosis or edema  ECOG PERFORMANCE STATUS: 1 - Symptomatic but completely  ambulatory  Blood pressure 138/79, pulse 80, temperature 98.7 F (37.1 C), temperature source Oral, resp. rate 17, height _0  (1.727 m), weight 227 lb 9.6 oz (103.2 kg), SpO2 100 %.  LABORATORY DATA: Lab Results  Component Value Date   WBC 2.6 (L) 12/15/2020   HGB 9.8 (L) 12/15/2020   HCT 30.9 (L) 12/15/2020   MCV 84.0 12/15/2020   PLT 225 12/15/2020      Chemistry      Component Value Date/Time   NA 135 09/15/2020 1121   K 3.6 09/15/2020 1121   CL 101 09/15/2020 1121   CO2 26 09/15/2020 1121   BUN 11 09/15/2020 1121   CREATININE  0.79 09/15/2020 1121      Component Value Date/Time   CALCIUM 9.2 09/15/2020 1121   ALKPHOS 87 09/15/2020 1121   AST 18 09/15/2020 1121   ALT 14 09/15/2020 1121   BILITOT 0.5 09/15/2020 1121       RADIOGRAPHIC STUDIES: No results found.  ASSESSMENT AND PLAN: This is a very pleasant 75 years old African-American female with persistent leukocytopenia and anemia of unclear etiology.  It was initially thought to be drug-induced but there is no improvement in her condition. I recommended for the patient to proceed with a bone marrow biopsy and aspirate for further evaluation of her condition and to rule out any underlying bone marrow disorder. I will see her back for follow-up visit in 1 months for evaluation and discussion of the biopsy results and further recommendation regarding her condition. The patient was advised to call immediately if she has any other concerning symptoms in the interval. The patient voices understanding of current disease status and treatment options and is in agreement with the current care plan.  All questions were answered. The patient knows to call the clinic with any problems, questions or concerns. We can certainly see the patient much sooner if necessary. The total time spent in the appointment was 20 minutes.  Disclaimer: This note was dictated with voice recognition software. Similar sounding words can  inadvertently be transcribed and may not be corrected upon review.

## 2020-12-21 ENCOUNTER — Other Ambulatory Visit: Payer: Self-pay | Admitting: Internal Medicine

## 2020-12-21 ENCOUNTER — Telehealth: Payer: Self-pay | Admitting: Medical Oncology

## 2020-12-21 DIAGNOSIS — M19012 Primary osteoarthritis, left shoulder: Secondary | ICD-10-CM | POA: Diagnosis not present

## 2020-12-21 DIAGNOSIS — D539 Nutritional anemia, unspecified: Secondary | ICD-10-CM

## 2020-12-21 NOTE — Telephone Encounter (Signed)
Pt presented to scheduling asking about her next appt.   Per Dr. Worthy Flank note 12/15/2020 pt needs to come back in a month.

## 2020-12-23 DIAGNOSIS — M19012 Primary osteoarthritis, left shoulder: Secondary | ICD-10-CM | POA: Diagnosis not present

## 2020-12-28 DIAGNOSIS — M19012 Primary osteoarthritis, left shoulder: Secondary | ICD-10-CM | POA: Diagnosis not present

## 2020-12-30 DIAGNOSIS — M19012 Primary osteoarthritis, left shoulder: Secondary | ICD-10-CM | POA: Diagnosis not present

## 2020-12-31 ENCOUNTER — Ambulatory Visit
Admission: RE | Admit: 2020-12-31 | Discharge: 2020-12-31 | Disposition: A | Discharge status: Home / Self Care | Payer: Medicare Other | Source: Ambulatory Visit | Attending: Obstetrics and Gynecology | Admitting: Obstetrics and Gynecology

## 2020-12-31 DIAGNOSIS — Z1231 Encounter for screening mammogram for malignant neoplasm of breast: Secondary | ICD-10-CM | POA: Diagnosis not present

## 2021-01-03 DIAGNOSIS — M19012 Primary osteoarthritis, left shoulder: Secondary | ICD-10-CM | POA: Diagnosis not present

## 2021-01-05 DIAGNOSIS — M19012 Primary osteoarthritis, left shoulder: Secondary | ICD-10-CM | POA: Diagnosis not present

## 2021-01-11 ENCOUNTER — Other Ambulatory Visit: Payer: Medicare Other

## 2021-01-11 ENCOUNTER — Ambulatory Visit: Payer: Medicare Other | Admitting: Internal Medicine

## 2021-01-12 DIAGNOSIS — M19012 Primary osteoarthritis, left shoulder: Secondary | ICD-10-CM | POA: Diagnosis not present

## 2021-01-14 ENCOUNTER — Other Ambulatory Visit: Payer: Self-pay | Admitting: Radiology

## 2021-01-16 ENCOUNTER — Other Ambulatory Visit: Payer: Self-pay | Admitting: Student

## 2021-01-16 NOTE — H&P (Signed)
Chief Complaint: Patient was seen in consultation today for bone marrow biopsy at the request of [facility]  Referring Physician(s): Mohamed,Mohamed  Supervising Physician: Markus Daft  Patient Status: [facility] - Out-pt  History of Present Illness: Bridget Cortez is a 75 y.o. female with PMHs of HTN, asthma left breast CA in 1999 status post lumpectomy, chemo and radiation therapy, and persistent leukocytosis and anemia.  Patient has been followed by Dr. Julien Nordmann from hem/onc and underwent serologic workup which has not revealed clear etiology of the persistent leukocytosis or anemia. A bone marrow biopsy for further evaluation was recommended to the patient and after thorough discussion and shared decision making, patient decided to proceed with the bone marrow biopsy.   IR was requested for image guided BMBx. Patient presents to [facility] IR today for the procedure.   Patient laying in bed, not in acute distress.  States that she is nervous.  Denise headache, fever, chills, shortness of breath, cough, chest pain, abdominal pain, nausea ,vomiting, and bleeding.  Past Medical History:  Diagnosis Date   Abnormal Pap smear 2006   vaginal medicine according to patient   Allergy    Arthritis    Asthma    Atrophy of vagina 06/2005   Breast cancer ([place]) 1999   Left   CAP (community acquired pneumonia) 08/06/2014   Cataract    cataracts lt. eye    cataract removed.   Chiari malformation    Diverticulosis    Dysrhythmia    SVT   GERD (gastroesophageal reflux disease)    H/O osteopenia    08/2006   H/O varicella    Heart murmur    Hypertension    Monilial vaginitis 07/2007   Multiple allergies    Ovarian cyst    Personal history of colonic adenomas 03/13/2012   Personal history of radiation therapy 1990   Pneumonia 07/2014   VIN III (vulvar intraepithelial neoplasia III) 03/2009   Yeast infection     Past Surgical History:  Procedure Laterality Date   ABDOMINAL  HYSTERECTOMY     ABDOMINAL SURGERY     BREAST EXCISIONAL BIOPSY Right    benign   BREAST LUMPECTOMY Left 1990   BREAST SURGERY     Lumpectomy, left breast   CARDIOVERSION N/A 07/28/2020   Procedure: CARDIOVERSION;  Surgeon: Donato Heinz, MD;  Location: [facility];  Service: Cardiovascular;  Laterality: N/A;   CATARACT EXTRACTION Left 2018   COLONOSCOPY     COLONOSCOPY W/ BIOPSIES     EYE SURGERY     HERNIA REPAIR     KNEE SURGERY     right   lt. eye aneurysm surgery Left 2017   MOUTH SURGERY  2019   rt. side gum surgery  Lt. done 3 years ago   RADIOACTIVE SEED GUIDED EXCISIONAL BREAST BIOPSY Right 07/09/2018   Procedure: RADIOACTIVE SEED GUIDED EXCISIONAL RIGHT  BREAST BIOPSY;  Surgeon: Stark Klein, MD;  Location: [facility];  Service: General;  Laterality: Right;   TOTAL KNEE ARTHROPLASTY Right 09/09/2019   Procedure: RIGHT TOTAL KNEE ARTHROPLASTY;  Surgeon: Melrose Nakayama, MD;  Location: [facility];  Service: Orthopedics;  Laterality: Right;    Allergies: Ephedrine and Cortisone  Medications: Prior to Admission medications   Medication Sig Start Date End Date Taking? Authorizing Provider  acetaminophen (TYLENOL) 325 MG tablet Take 2 tablets (650 mg total) by mouth every 6 (six) hours as needed for mild pain, fever or headache (or Fever >/= 101). 08/11/14   Hongalgi, [employer]  D, MD  albuterol (PROVENTIL) (5 MG/ML) 0.5% nebulizer solution Take 0.5 mLs (2.5 mg total) by nebulization every 6 (six) hours as needed for wheezing or shortness of breath. 02/23/18   Curatolo, Adam, DO  amLODipine (NORVASC) 10 MG tablet Take 5 mg by mouth at bedtime.     [provider]  Calcium Carbonate-Vitamin D (CALTRATE 600+D PO) Take 1 tablet by mouth daily with lunch.     [provider]  cetirizine (ZYRTEC) 10 MG tablet Take 10 mg by mouth daily.    [provider]  esomeprazole (NEXIUM) 40 MG capsule Take 40 mg by mouth 2 (two) times daily before a meal.     [provider]  fluticasone (FLONASE) 50 MCG/ACT nasal spray Place 1 spray into both nostrils at bedtime.  03/16/12   [provider]  Fluticasone-Salmeterol (ADVAIR) 250-50 MCG/DOSE AEPB Inhale 1 puff into the lungs every 12 (twelve) hours.    [provider]  Multiple Vitamin (MULTIVITAMIN) tablet Take 1 tablet by mouth daily with lunch.     [provider]  nebivolol (BYSTOLIC) 5 MG tablet Take 1 tablet by mouth once daily 07/05/20   Burnell Blanks, MD  potassium chloride SA (KLOR-CON) 20 MEQ tablet Take 20 mEq by mouth 3 (three) times daily.    [provider]  rivaroxaban (XARELTO) 20 MG TABS tablet Take 1 tablet (20 mg total) by mouth daily with supper. 12/14/20   Deboraha Sprang, MD  rosuvastatin (CRESTOR) 5 MG tablet Take 5 mg by mouth daily.    [provider]  simethicone (MYLICON) 127 MG chewable tablet Chew 125 mg by mouth every 6 (six) hours as needed for flatulence.    [provider]  telmisartan (MICARDIS) 40 MG tablet Take 40 mg by mouth daily.    [provider]  torsemide (DEMADEX) 20 MG tablet Take 1 tablet (20 mg total) by mouth every other day. 11/01/20   Fenton, Clint R, PA  traMADol (ULTRAM) 50 MG tablet Take 50 mg by mouth as needed. 11/05/20   [provider]  traZODone (DESYREL) 50 MG tablet Take 25 mg by mouth at bedtime as needed for sleep.    [provider]  triamcinolone cream (KENALOG) 0.1 % Apply 1 application topically daily as needed ([place]). 07/05/20   [provider]  vitamin C (ASCORBIC ACID) 500 MG tablet Take 500 mg by mouth daily.    [provider]     Family History  Problem Relation Age of Onset   Cancer Mother    Heart failure Other    Colon cancer Neg Hx    Esophageal cancer Neg Hx    Rectal cancer Neg Hx    Stomach cancer Neg Hx    Colon polyps Neg Hx     Social History   Socioeconomic History   Marital status: Married    Spouse name:  Not on file   Number of children: 1   Years of education: Not on file   Highest education level: Not on file  Occupational History   Occupation: Retired Education officer, museum  Tobacco Use   Smoking status: Former    Types: Cigarettes    Quit date: 01/23/1978    Years since quitting: 43.0   Smokeless tobacco: Never  Vaping Use   Vaping Use: Never used  Substance and Sexual Activity   Alcohol use: No   Drug use: No   Sexual activity: Yes    Birth control/protection: Surgical  Other Topics Concern   Not on file  Social History Narrative   Not on file   Social Determinants of Health   Financial Resource Strain: Not on file  Food Insecurity: Not on file  Transportation Needs: Not on file  Physical Activity: Not on file  Stress: Not on file  Social Connections: Not on file     Review of Systems: A 12 point ROS discussed and pertinent positives are indicated in the HPI above.  All other systems are negative.  Vital Signs: BP 138/66    Pulse 88    Temp 98.5 F (36.9 C) (Oral)    Resp 17    Ht 5' 8.5" (1.74 m)    Wt 228 lb (103.4 kg)    SpO2 100%    BMI 34.16 kg/m   Physical Exam Vitals and nursing note reviewed.  Constitutional:      General: Patient is not in acute distress.    Appearance: Normal appearance. Patient is not ill-appearing.  HENT:     Head: Normocephalic and atraumatic.     Mouth/Throat:     Mouth: Mucous membranes are moist.     Pharynx: Oropharynx is clear.  Cardiovascular:     Rate and Rhythm: Normal rate and regular rhythm.     Pulses: Normal pulses.     Heart sounds: Normal heart sounds.  Pulmonary:     Effort: Pulmonary effort is normal.     Breath sounds: Normal breath sounds.  Abdominal:     General: Abdomen is flat. Bowel sounds are normal.     Palpations: Abdomen is soft.  Musculoskeletal:     Cervical back: Neck supple.  Skin:    General: Skin is warm and dry.     Coloration: Skin is not jaundiced or pale.  Neurological:     Mental Status:  Patient is alert and oriented to person, place, and time.  Psychiatric:        Mood and Affect: Mood nervous        Behavior: Behavior normal.        Judgment: Judgment normal.     MD Evaluation Airway: WNL Heart: WNL Abdomen: WNL Chest/ Lungs: WNL ASA  Classification: 2 Mallampati/Airway Score: Two  Imaging: MM 3D SCREEN BREAST BILATERAL  Result Date: 12/31/2020 CLINICAL DATA:  Screening. EXAM: DIGITAL SCREENING BILATERAL MAMMOGRAM WITH TOMOSYNTHESIS AND CAD TECHNIQUE: Bilateral screening digital craniocaudal and mediolateral oblique mammograms were obtained. Bilateral screening digital breast tomosynthesis was performed. The images were evaluated with computer-aided detection. COMPARISON:  Previous exam(s). ACR Breast Density Category b: There are scattered areas of fibroglandular density. FINDINGS: There are no findings suspicious for malignancy. IMPRESSION: No mammographic evidence of malignancy. A result letter of this screening mammogram will be mailed directly to the patient. RECOMMENDATION: Screening mammogram in one year. (Code:SM-B-01Y) BI-RADS CATEGORY  1: Negative. Electronically Signed   By: Fidela Salisbury M.D.   On: 12/31/2020 15:03    Labs:  CBC: Recent Labs    08/19/20 1416 09/15/20 1105 12/15/20 1254 01/18/21 0752  WBC 2.7* 3.4* 2.6* 2.7*  HGB 11.6* 11.4* 9.8* 11.1*  HCT 35.6* 34.5* 30.9* 35.2*  PLT 238 220 225 233    COAGS: No results for input(s): INR, APTT in the last 8760 hours.  BMP: Recent Labs    04/17/20 0700 07/14/20 1226 08/04/20 1428 09/15/20 1121  NA 134* 131* 130* 135  K 3.7 3.8 3.9 3.6  CL 100 97* 98 101  CO2 _0 GLUCOSE  103* 94 87 67*  BUN _0 CALCIUM 9.4 9.0 8.9 9.2  CREATININE 0.54 0.78 0.69 0.79  GFRNONAA >60 >60 >60 >60    LIVER FUNCTION TESTS: Recent Labs    07/14/20 1226 09/15/20 1121  BILITOT 0.9 0.5  AST 19 18  ALT 21 14  ALKPHOS 70 87  PROT 6.7 6.9  ALBUMIN 3.6 3.6    TUMOR  MARKERS: No results for input(s): AFPTM, CEA, CA199, CHROMGRNA in the last 8760 hours.  Assessment and Plan: 75 y.o. female with persistent leukocytopenia and anemia of unclear etiology who is in need for BMBx for further evaluation and management.   Patient presents to [facility] IR today for the procedure.  NPO since [place] VSS CBC w/ diff stable  On Xarelto 20 mg - no need for D/C   Risks and benefits of bone marrow biopsy and aspiration was discussed with the patient and/or patient's family including, but not limited to bleeding, infection, damage to adjacent structures or low yield requiring additional tests.  All of the questions were answered and there is agreement to proceed.  Consent signed and in chart.  Thank you for this interesting consult.  I greatly enjoyed meeting Bridget Cortez and look forward to participating in their care.  A copy of this report was sent to the requesting provider on this date.  Electronically Signed: Tera Mater, [name] 01/18/2021, 8:46 AM   I spent a total of  30 Minutes   in face to face in clinical consultation, greater than 50% of which was counseling/coordinating care for BMBx  This chart was dictated using voice recognition software.  Despite best efforts to proofread,  errors can occur which can change the documentation meaning.

## 2021-01-18 ENCOUNTER — Encounter (HOSPITAL_COMMUNITY): Payer: Self-pay

## 2021-01-18 ENCOUNTER — Ambulatory Visit (HOSPITAL_COMMUNITY)
Admission: RE | Admit: 2021-01-18 | Discharge: 2021-01-18 | Disposition: A | Discharge status: Home / Self Care | Payer: Medicare Other | Source: Ambulatory Visit | Attending: Internal Medicine | Admitting: Internal Medicine

## 2021-01-18 ENCOUNTER — Other Ambulatory Visit: Payer: Self-pay

## 2021-01-18 DIAGNOSIS — J45909 Unspecified asthma, uncomplicated: Secondary | ICD-10-CM | POA: Diagnosis not present

## 2021-01-18 DIAGNOSIS — D72822 Plasmacytosis: Secondary | ICD-10-CM | POA: Diagnosis not present

## 2021-01-18 DIAGNOSIS — D539 Nutritional anemia, unspecified: Secondary | ICD-10-CM | POA: Insufficient documentation

## 2021-01-18 DIAGNOSIS — I10 Essential (primary) hypertension: Secondary | ICD-10-CM | POA: Diagnosis not present

## 2021-01-18 DIAGNOSIS — D72819 Decreased white blood cell count, unspecified: Secondary | ICD-10-CM | POA: Insufficient documentation

## 2021-01-18 DIAGNOSIS — Z87891 Personal history of nicotine dependence: Secondary | ICD-10-CM | POA: Diagnosis not present

## 2021-01-18 DIAGNOSIS — Z853 Personal history of malignant neoplasm of breast: Secondary | ICD-10-CM | POA: Insufficient documentation

## 2021-01-18 DIAGNOSIS — D75822 Immune-mediated heparin-induced thrombocytopenia: Secondary | ICD-10-CM | POA: Diagnosis not present

## 2021-01-18 DIAGNOSIS — D649 Anemia, unspecified: Secondary | ICD-10-CM | POA: Diagnosis not present

## 2021-01-18 LAB — CBC WITH DIFFERENTIAL/PLATELET
Abs Immature Granulocytes: 0.01 10*3/uL (ref 0.00–0.07)
Basophils Absolute: 0 10*3/uL (ref 0.0–0.1)
Basophils Relative: 1 %
Eosinophils Absolute: 0.1 10*3/uL (ref 0.0–0.5)
Eosinophils Relative: 3 %
HCT: 35.2 % — ABNORMAL LOW (ref 36.0–46.0)
Hemoglobin: 11.1 g/dL — ABNORMAL LOW (ref 12.0–15.0)
Immature Granulocytes: 0 %
Lymphocytes Relative: 44 %
Lymphs Abs: 1.2 10*3/uL (ref 0.7–4.0)
MCH: 26.8 pg (ref 26.0–34.0)
MCHC: 31.5 g/dL (ref 30.0–36.0)
MCV: 85 fL (ref 80.0–100.0)
Monocytes Absolute: 0.3 10*3/uL (ref 0.1–1.0)
Monocytes Relative: 13 %
Neutro Abs: 1 10*3/uL — ABNORMAL LOW (ref 1.7–7.7)
Neutrophils Relative %: 39 %
Platelets: 233 10*3/uL (ref 150–400)
RBC: 4.14 MIL/uL (ref 3.87–5.11)
RDW: 17.8 % — ABNORMAL HIGH (ref 11.5–15.5)
WBC: 2.7 10*3/uL — ABNORMAL LOW (ref 4.0–10.5)
nRBC: 0 % (ref 0.0–0.2)

## 2021-01-18 MED ORDER — NALOXONE HCL 0.4 MG/ML IJ SOLN
INTRAMUSCULAR | Status: AC
  Filled 2021-01-18: qty 1

## 2021-01-18 MED ORDER — FENTANYL CITRATE (PF) 100 MCG/2ML IJ SOLN
INTRAMUSCULAR | Status: AC | PRN
  Administered 2021-01-18: 50 ug via INTRAVENOUS

## 2021-01-18 MED ORDER — MIDAZOLAM HCL 2 MG/2ML IJ SOLN
INTRAMUSCULAR | Status: AC | PRN
  Administered 2021-01-18: .5 mg via INTRAVENOUS

## 2021-01-18 MED ORDER — LIDOCAINE HCL (PF) 1 % IJ SOLN
INTRAMUSCULAR | Status: AC | PRN
  Administered 2021-01-18: 10 mL via INTRADERMAL

## 2021-01-18 MED ORDER — FENTANYL CITRATE (PF) 100 MCG/2ML IJ SOLN
INTRAMUSCULAR | Status: DC | PRN
  Administered 2021-01-18: 25 ug via INTRAVENOUS

## 2021-01-18 MED ORDER — FENTANYL CITRATE (PF) 100 MCG/2ML IJ SOLN
INTRAMUSCULAR | Status: AC
  Filled 2021-01-18: qty 4

## 2021-01-18 MED ORDER — FLUMAZENIL 0.5 MG/5ML IV SOLN
INTRAVENOUS | Status: AC
  Filled 2021-01-18: qty 5

## 2021-01-18 MED ORDER — SODIUM CHLORIDE 0.9 % IV SOLN: INTRAVENOUS | Status: DC

## 2021-01-18 MED ORDER — MIDAZOLAM HCL 2 MG/2ML IJ SOLN
INTRAMUSCULAR | Status: AC
  Filled 2021-01-18: qty 2

## 2021-01-18 NOTE — Procedures (Addendum)
Interventional Radiology Procedure:   Indications: Leukopenia and anemia  Procedure: CT guided bone marrow biopsy  Findings: 2 aspirates and 2 cores from right ilium  Complications: None     EBL: Minimal, less than 10 ml  Plan: Discharge to home in one hour.   Tajanay Hurley R. Anselm Pancoast, MD  Pager: [phone]

## 2021-01-18 NOTE — Discharge Instructions (Signed)
Please call Interventional Radiology clinic [phone] with any questions or concerns. ° °You may remove your dressing and shower tomorrow. ° ° °Bone Marrow Aspiration and Bone Marrow Biopsy, Adult, Care After °This sheet gives you information about how to care for yourself after your procedure. Your health care provider may also give you more specific instructions. If you have problems or questions, contact your health care provider. °What can I expect after the procedure? °After the procedure, it is common to have: °Mild pain and tenderness. °Swelling. °Bruising. °Follow these instructions at home: °Puncture site care °Follow instructions from your health care provider about how to take care of the puncture site. Make sure you: °Wash your hands with soap and water before and after you change your bandage (dressing). If soap and water are not available, use hand sanitizer. °Change your dressing as told by your health care provider. °Check your puncture site every day for signs of infection. Check for: °More redness, swelling, or pain. °Fluid or blood. °Warmth. °Pus or a bad smell.   °Activity °Return to your normal activities as told by your health care provider. Ask your health care provider what activities are safe for you. °Do not lift anything that is heavier than 10 lb (4.5 kg), or the limit that you are told, until your health care provider says that it is safe. °Do not drive for 24 hours if you were given a sedative during your procedure. °General instructions °Take over-the-counter and prescription medicines only as told by your health care provider. °Do not take baths, swim, or use a hot tub until your health care provider approves. Ask your health care provider if you may take showers. You may only be allowed to take sponge baths. °If directed, put ice on the affected area. To do this: °Put ice in a plastic bag. °Place a towel between your skin and the bag. °Leave the ice on for 20 minutes, 2-3 times a  day. °Keep all follow-up visits as told by your health care provider. This is important.   °Contact a health care provider if: °Your pain is not controlled with medicine. °You have a fever. °You have more redness, swelling, or pain around the puncture site. °You have fluid or blood coming from the puncture site. °Your puncture site feels warm to the touch. °You have pus or a bad smell coming from the puncture site. °Summary °After the procedure, it is common to have mild pain, tenderness, swelling, and bruising. °Follow instructions from your health care provider about how to take care of the puncture site and what activities are safe for you. °Take over-the-counter and prescription medicines only as told by your health care provider. °Contact a health care provider if you have any signs of infection, such as fluid or blood coming from the puncture site. °This information is not intended to replace advice given to you by your health care provider. Make sure you discuss any questions you have with your health care provider. °Document Revised: 05/28/2018 Document Reviewed: 05/28/2018 °Elsevier Patient Education © 2021 [employer] ° ° °Moderate Conscious Sedation, Adult, Care After °This sheet gives you information about how to care for yourself after your procedure. Your health care provider may also give you more specific instructions. If you have problems or questions, contact your health care provider. °What can I expect after the procedure? °After the procedure, it is common to have: °Sleepiness for several hours. °Impaired judgment for several hours. °Difficulty with balance. °Vomiting if you eat too   soon. °Follow these instructions at home: °For the time period you were told by your health care provider: °Rest. °Do not participate in activities where you could fall or become injured. °Do not drive or use machinery. °Do not drink alcohol. °Do not take sleeping pills or medicines that cause drowsiness. °Do not  make important decisions or sign legal documents. °Do not take care of children on your own.  °  °  °Eating and drinking °Follow the diet recommended by your health care provider. °Drink enough fluid to keep your urine pale yellow. °If you vomit: °Drink water, juice, or soup when you can drink without vomiting. °Make sure you have little or no nausea before eating solid foods.   °General instructions °Take over-the-counter and prescription medicines only as told by your health care provider. °Have a responsible adult stay with you for the time you are told. It is important to have someone help care for you until you are awake and alert. °Do not smoke. °Keep all follow-up visits as told by your health care provider. This is important. °Contact a health care provider if: °You are still sleepy or having trouble with balance after 24 hours. °You feel light-headed. °You keep feeling nauseous or you keep vomiting. °You develop a rash. °You have a fever. °You have redness or swelling around the IV site. °Get help right away if: °You have trouble breathing. °You have new-onset confusion at home. °Summary °After the procedure, it is common to feel sleepy, have impaired judgment, or feel nauseous if you eat too soon. °Rest after you get home. Know the things you should not do after the procedure. °Follow the diet recommended by your health care provider and drink enough fluid to keep your urine pale yellow. °Get help right away if you have trouble breathing or new-onset confusion at home. °This information is not intended to replace advice given to you by your health care provider. Make sure you discuss any questions you have with your health care provider. °Document Revised: 05/09/2019 Document Reviewed: 12/05/2018 °Elsevier Patient Education © 2021 [employer] Inc.  °

## 2021-01-20 LAB — SURGICAL PATHOLOGY

## 2021-01-21 DIAGNOSIS — M19012 Primary osteoarthritis, left shoulder: Secondary | ICD-10-CM | POA: Diagnosis not present

## 2021-01-26 ENCOUNTER — Encounter (HOSPITAL_COMMUNITY): Payer: Self-pay | Admitting: Internal Medicine

## 2021-01-26 DIAGNOSIS — M19012 Primary osteoarthritis, left shoulder: Secondary | ICD-10-CM | POA: Diagnosis not present

## 2021-01-28 DIAGNOSIS — M19012 Primary osteoarthritis, left shoulder: Secondary | ICD-10-CM | POA: Diagnosis not present

## 2021-01-31 DIAGNOSIS — M19012 Primary osteoarthritis, left shoulder: Secondary | ICD-10-CM | POA: Diagnosis not present

## 2021-02-01 ENCOUNTER — Encounter: Payer: Self-pay | Admitting: Internal Medicine

## 2021-02-01 ENCOUNTER — Inpatient Hospital Stay: Payer: Medicare Other | Attending: Internal Medicine

## 2021-02-01 ENCOUNTER — Inpatient Hospital Stay (HOSPITAL_BASED_OUTPATIENT_CLINIC_OR_DEPARTMENT_OTHER): Payer: Medicare Other | Admitting: Internal Medicine

## 2021-02-01 ENCOUNTER — Other Ambulatory Visit: Payer: Self-pay

## 2021-02-01 VITALS — BP 151/82 | HR 96 | Temp 97.9°F | Resp 18 | Wt 233.8 lb

## 2021-02-01 DIAGNOSIS — K219 Gastro-esophageal reflux disease without esophagitis: Secondary | ICD-10-CM | POA: Diagnosis not present

## 2021-02-01 DIAGNOSIS — D539 Nutritional anemia, unspecified: Secondary | ICD-10-CM

## 2021-02-01 DIAGNOSIS — Z79899 Other long term (current) drug therapy: Secondary | ICD-10-CM | POA: Insufficient documentation

## 2021-02-01 DIAGNOSIS — M8580 Other specified disorders of bone density and structure, unspecified site: Secondary | ICD-10-CM | POA: Diagnosis not present

## 2021-02-01 DIAGNOSIS — D72819 Decreased white blood cell count, unspecified: Secondary | ICD-10-CM | POA: Insufficient documentation

## 2021-02-01 DIAGNOSIS — I10 Essential (primary) hypertension: Secondary | ICD-10-CM | POA: Diagnosis not present

## 2021-02-01 DIAGNOSIS — R5383 Other fatigue: Secondary | ICD-10-CM | POA: Diagnosis not present

## 2021-02-01 DIAGNOSIS — Z7901 Long term (current) use of anticoagulants: Secondary | ICD-10-CM | POA: Insufficient documentation

## 2021-02-01 DIAGNOSIS — D509 Iron deficiency anemia, unspecified: Secondary | ICD-10-CM | POA: Diagnosis not present

## 2021-02-01 LAB — IRON AND IRON BINDING CAPACITY (CC-WL,HP ONLY)
Iron: 64 ug/dL (ref 28–170)
Saturation Ratios: 20 % (ref 10.4–31.8)
TIBC: 315 ug/dL (ref 250–450)
UIBC: 251 ug/dL (ref 148–442)

## 2021-02-01 LAB — CBC WITH DIFFERENTIAL (CANCER CENTER ONLY)
Abs Immature Granulocytes: 0 10*3/uL (ref 0.00–0.07)
Basophils Absolute: 0 10*3/uL (ref 0.0–0.1)
Basophils Relative: 1 %
Eosinophils Absolute: 0.1 10*3/uL (ref 0.0–0.5)
Eosinophils Relative: 4 %
HCT: 34 % — ABNORMAL LOW (ref 36.0–46.0)
Hemoglobin: 10.8 g/dL — ABNORMAL LOW (ref 12.0–15.0)
Immature Granulocytes: 0 %
Lymphocytes Relative: 31 %
Lymphs Abs: 1 10*3/uL (ref 0.7–4.0)
MCH: 26.8 pg (ref 26.0–34.0)
MCHC: 31.8 g/dL (ref 30.0–36.0)
MCV: 84.4 fL (ref 80.0–100.0)
Monocytes Absolute: 0.3 10*3/uL (ref 0.1–1.0)
Monocytes Relative: 10 %
Neutro Abs: 1.7 10*3/uL (ref 1.7–7.7)
Neutrophils Relative %: 54 %
Platelet Count: 221 10*3/uL (ref 150–400)
RBC: 4.03 MIL/uL (ref 3.87–5.11)
RDW: 17.7 % — ABNORMAL HIGH (ref 11.5–15.5)
WBC Count: 3.1 10*3/uL — ABNORMAL LOW (ref 4.0–10.5)
nRBC: 0 % (ref 0.0–0.2)

## 2021-02-01 LAB — RETICULOCYTES
Immature Retic Fract: 15.4 % (ref 2.3–15.9)
RBC.: 4.01 MIL/uL (ref 3.87–5.11)
Retic Count, Absolute: 47.7 10*3/uL (ref 19.0–186.0)
Retic Ct Pct: 1.2 % (ref 0.4–3.1)

## 2021-02-01 LAB — FERRITIN: Ferritin: 56 ng/mL (ref 11–307)

## 2021-02-01 NOTE — Progress Notes (Signed)
[facility] [phone]   Fax:([phone]  OFFICE PROGRESS NOTE  [name], MD [place] [address]  DIAGNOSIS:  Leukocytopenia and mild anemia.  Her leukocytopenia is likely drug-induced from her current treatment rosuvastatin.  She also has mild anemia of chronic disease.    PRIOR THERAPY:None  CURRENT THERAPY: None  INTERVAL HISTORY: Bridget Cortez 76 y.o. female returns to the clinic today for follow-up visit accompanied by her husband.  The patient is feeling fine today with no concerning complaints except for mild fatigue.  She denied having any current chest pain, shortness of breath, cough or hemoptysis.  She denied having any fever or chills.  She has no nausea, vomiting, diarrhea or constipation.  She has no headache or visual changes.  She has no recent weight loss or night sweats.  She underwent a bone marrow biopsy and aspirate recently and she is here for evaluation and discussion of her biopsy results and recommendation regarding her condition.   MEDICAL HISTORY: Past Medical History:  Diagnosis Date   Abnormal Pap smear 2006   vaginal medicine according to patient   Allergy    Arthritis    Asthma    Atrophy of vagina 06/2005   Breast cancer ([facility]) 1999   Left   CAP (community acquired pneumonia) 08/06/2014   Cataract    cataracts lt. eye    cataract removed.   Chiari malformation    Diverticulosis    Dysrhythmia    SVT   GERD (gastroesophageal reflux disease)    H/O osteopenia    08/2006   H/O varicella    Heart murmur    Hypertension    Monilial vaginitis 07/2007   Multiple allergies    Ovarian cyst    Personal history of colonic adenomas 03/13/2012   Personal history of radiation therapy 1990   Pneumonia 07/2014   VIN III (vulvar intraepithelial neoplasia III) 03/2009   Yeast infection     ALLERGIES:  is allergic to ephedrine and cortisone.  MEDICATIONS:  Current Outpatient  Medications  Medication Sig Dispense Refill   acetaminophen (TYLENOL) 325 MG tablet Take 2 tablets (650 mg total) by mouth every 6 (six) hours as needed for mild pain, fever or headache (or Fever >/= 101).     albuterol (PROVENTIL) (5 MG/ML) 0.5% nebulizer solution Take 0.5 mLs (2.5 mg total) by nebulization every 6 (six) hours as needed for wheezing or shortness of breath. 20 mL 12   amLODipine (NORVASC) 10 MG tablet Take 5 mg by mouth at bedtime.      Calcium Carbonate-Vitamin D (CALTRATE 600+D PO) Take 1 tablet by mouth daily with lunch.      cetirizine (ZYRTEC) 10 MG tablet Take 10 mg by mouth daily.     esomeprazole (NEXIUM) 40 MG capsule Take 40 mg by mouth 2 (two) times daily before a meal.      fluticasone (FLONASE) 50 MCG/ACT nasal spray Place 1 spray into both nostrils at bedtime.      Fluticasone-Salmeterol (ADVAIR) 250-50 MCG/DOSE AEPB Inhale 1 puff into the lungs every 12 (twelve) hours.     Multiple Vitamin (MULTIVITAMIN) tablet Take 1 tablet by mouth daily with lunch.      nebivolol (BYSTOLIC) 5 MG tablet Take 1 tablet by mouth once daily 90 tablet 3   potassium chloride SA (KLOR-CON) 20 MEQ tablet Take 20 mEq by mouth 3 (three) times daily.     rivaroxaban (XARELTO) 20 MG  TABS tablet Take 1 tablet (20 mg total) by mouth daily with supper. 90 tablet 1   rosuvastatin (CRESTOR) 5 MG tablet Take 5 mg by mouth daily.     simethicone (MYLICON) 009 MG chewable tablet Chew 125 mg by mouth every 6 (six) hours as needed for flatulence.     telmisartan (MICARDIS) 40 MG tablet Take 40 mg by mouth daily.     torsemide (DEMADEX) 20 MG tablet Take 1 tablet (20 mg total) by mouth every other day. 90 tablet 3   traMADol (ULTRAM) 50 MG tablet Take 50 mg by mouth as needed.     traZODone (DESYREL) 50 MG tablet Take 25 mg by mouth at bedtime as needed for sleep.     triamcinolone cream (KENALOG) 0.1 % Apply 1 application topically daily as needed ([place]).     vitamin C (ASCORBIC ACID) 500 MG tablet  Take 500 mg by mouth daily.     No current facility-administered medications for this visit.    SURGICAL HISTORY:  Past Surgical History:  Procedure Laterality Date   ABDOMINAL HYSTERECTOMY     ABDOMINAL SURGERY     BREAST EXCISIONAL BIOPSY Right    benign   BREAST LUMPECTOMY Left 1990   BREAST SURGERY     Lumpectomy, left breast   CARDIOVERSION N/A 07/28/2020   Procedure: CARDIOVERSION;  Surgeon: Donato Heinz, MD;  Location: [facility];  Service: Cardiovascular;  Laterality: N/A;   CATARACT EXTRACTION Left 2018   COLONOSCOPY     COLONOSCOPY W/ BIOPSIES     EYE SURGERY     HERNIA REPAIR     KNEE SURGERY     right   lt. eye aneurysm surgery Left 2017   MOUTH SURGERY  2019   rt. side gum surgery  Lt. done 3 years ago   RADIOACTIVE SEED GUIDED EXCISIONAL BREAST BIOPSY Right 07/09/2018   Procedure: RADIOACTIVE SEED GUIDED EXCISIONAL RIGHT  BREAST BIOPSY;  Surgeon: Stark Klein, MD;  Location: [facility];  Service: General;  Laterality: Right;   TOTAL KNEE ARTHROPLASTY Right 09/09/2019   Procedure: RIGHT TOTAL KNEE ARTHROPLASTY;  Surgeon: Melrose Nakayama, MD;  Location: [facility];  Service: Orthopedics;  Laterality: Right;    REVIEW OF SYSTEMS:  Constitutional: positive for fatigue Eyes: negative Ears, nose, mouth, throat, and face: negative Respiratory: negative Cardiovascular: negative Gastrointestinal: negative Genitourinary:negative Integument/breast: negative Hematologic/lymphatic: negative Musculoskeletal:positive for arthralgias Neurological: negative Behavioral/Psych: negative Endocrine: negative Allergic/Immunologic: negative   PHYSICAL EXAMINATION: General appearance: alert, cooperative, fatigued, and no distress Head: Normocephalic, without obvious abnormality, atraumatic Neck: no adenopathy, no JVD, supple, symmetrical, trachea midline, and thyroid not enlarged, symmetric, no tenderness/mass/nodules Lymph nodes: Cervical, supraclavicular, and axillary nodes  normal. Resp: clear to auscultation bilaterally Back: symmetric, no curvature. ROM normal. No CVA tenderness. Cardio: regular rate and rhythm, S1, S2 normal, no murmur, click, rub or gallop GI: soft, non-tender; bowel sounds normal; no masses,  no organomegaly Extremities: extremities normal, atraumatic, no cyanosis or edema Neurologic: Alert and oriented X 3, normal strength and tone. Normal symmetric reflexes. Normal coordination and gait  ECOG PERFORMANCE STATUS: 1 - Symptomatic but completely ambulatory  Blood pressure (!) 151/82, pulse 96, temperature 97.9 F (36.6 C), temperature source Oral, resp. rate 18, weight 233 lb 12.8 oz (106.1 kg), SpO2 100 %.  LABORATORY DATA: Lab Results  Component Value Date   WBC 3.1 (L) 02/01/2021   HGB 10.8 (L) 02/01/2021   HCT 34.0 (L) 02/01/2021   MCV 84.4 02/01/2021   PLT 221  02/01/2021      Chemistry      Component Value Date/Time   NA 135 09/15/2020 1121   K 3.6 09/15/2020 1121   CL 101 09/15/2020 1121   CO2 26 09/15/2020 1121   BUN 11 09/15/2020 1121   CREATININE 0.79 09/15/2020 1121      Component Value Date/Time   CALCIUM 9.2 09/15/2020 1121   ALKPHOS 87 09/15/2020 1121   AST 18 09/15/2020 1121   ALT 14 09/15/2020 1121   BILITOT 0.5 09/15/2020 1121       RADIOGRAPHIC STUDIES: CT Biopsy  Result Date: 02-05-2021 INDICATION: 76 year old with anemia and leukopenia. EXAM: CT GUIDED BONE MARROW ASPIRATES AND BIOPSY Physician: Stephan Minister. Henn, MD MEDICATIONS: Moderate sedation ANESTHESIA/SEDATION: Moderate (conscious) sedation was employed during this procedure. A total of Versed 1.64m and fentanyl 100 mcg was administered intravenously at the order of the provider performing the procedure. Total intra-service moderate sedation time: 14 minutes. Patient's level of consciousness and vital signs were monitored continuously by radiology nurse throughout the procedure under the supervision of the provider performing the procedure.  COMPLICATIONS: None immediate. PROCEDURE: The procedure was explained to the patient. The risks and benefits of the procedure were discussed and the patient's questions were addressed. Informed consent was obtained from the patient. The patient was placed prone on CT table. Images of the pelvis were obtained. The right side of back was prepped and draped in sterile fashion. The skin and right posterior ilium were anesthetized with 1% lidocaine. 11 gauge bone needle was directed into the right ilium with CT guidance. Two aspirates and two core biopsies were obtained. Bandage placed over the puncture site. IMPRESSION: CT guided bone marrow aspiration and core biopsy. Electronically Signed   By: AMarkus DaftM.D.   On: 101-14-2312:56   CT BONE MARROW BIOPSY & ASPIRATION  Result Date: 101-14-23INDICATION: 76year old with anemia and leukopenia. EXAM: CT GUIDED BONE MARROW ASPIRATES AND BIOPSY Physician: AStephan Minister Henn, MD MEDICATIONS: Moderate sedation ANESTHESIA/SEDATION: Moderate (conscious) sedation was employed during this procedure. A total of Versed 1.560mand fentanyl 100 mcg was administered intravenously at the order of the provider performing the procedure. Total intra-service moderate sedation time: 14 minutes. Patient's level of consciousness and vital signs were monitored continuously by radiology nurse throughout the procedure under the supervision of the provider performing the procedure. COMPLICATIONS: None immediate. PROCEDURE: The procedure was explained to the patient. The risks and benefits of the procedure were discussed and the patient's questions were addressed. Informed consent was obtained from the patient. The patient was placed prone on CT table. Images of the pelvis were obtained. The right side of back was prepped and draped in sterile fashion. The skin and right posterior ilium were anesthetized with 1% lidocaine. 11 gauge bone needle was directed into the right ilium with CT guidance.  Two aspirates and two core biopsies were obtained. Bandage placed over the puncture site. IMPRESSION: CT guided bone marrow aspiration and core biopsy. Electronically Signed   By: AdMarkus Daft.D.   On: 1201/14/20232:56    ASSESSMENT AND PLAN: This is a very pleasant 89ears old African-American female with persistent leukocytopenia and anemia of unclear etiology.  It was initially thought to be drug-induced but there is no improvement in her condition. The patient underwent a bone marrow biopsy and aspirate.  I personally discussed the result with the patient and her husband.  Her biopsy showed no concerning bone marrow abnormality except for a slight increase  of plasma cells but representing 4%. I had a lengthy discussion with the patient about her condition.  I recommended for her to continue on observation with repeat CBC, iron study and ferritin in addition to serum protein electrophoresis with immunofixation in 3 months. For the low normal serum iron, I recommended for her to increase her iron rich diet and also to consider over-the-counter oral iron tablets few days weekly. The patient has a lot of questions and I answered them completely to her satisfaction. She was advised to call immediately if she has any other concerning symptoms in the interval. The patient voices understanding of current disease status and treatment options and is in agreement with the current care plan.  All questions were answered. The patient knows to call the clinic with any problems, questions or concerns. We can certainly see the patient much sooner if necessary. The total time spent in the appointment was 30 minutes.  Disclaimer: This note was dictated with voice recognition software. Similar sounding words can inadvertently be transcribed and may not be corrected upon review.

## 2021-02-03 DIAGNOSIS — M19012 Primary osteoarthritis, left shoulder: Secondary | ICD-10-CM | POA: Diagnosis not present

## 2021-02-08 DIAGNOSIS — M19012 Primary osteoarthritis, left shoulder: Secondary | ICD-10-CM | POA: Diagnosis not present

## 2021-02-10 DIAGNOSIS — M19012 Primary osteoarthritis, left shoulder: Secondary | ICD-10-CM | POA: Diagnosis not present

## 2021-02-15 DIAGNOSIS — M19012 Primary osteoarthritis, left shoulder: Secondary | ICD-10-CM | POA: Diagnosis not present

## 2021-02-17 DIAGNOSIS — M19012 Primary osteoarthritis, left shoulder: Secondary | ICD-10-CM | POA: Diagnosis not present

## 2021-02-22 DIAGNOSIS — M19012 Primary osteoarthritis, left shoulder: Secondary | ICD-10-CM | POA: Diagnosis not present

## 2021-02-23 DIAGNOSIS — Z6841 Body Mass Index (BMI) 40.0 and over, adult: Secondary | ICD-10-CM | POA: Diagnosis not present

## 2021-02-23 DIAGNOSIS — M19012 Primary osteoarthritis, left shoulder: Secondary | ICD-10-CM | POA: Diagnosis not present

## 2021-02-24 DIAGNOSIS — M19012 Primary osteoarthritis, left shoulder: Secondary | ICD-10-CM | POA: Diagnosis not present

## 2021-03-01 DIAGNOSIS — M19012 Primary osteoarthritis, left shoulder: Secondary | ICD-10-CM | POA: Diagnosis not present

## 2021-03-02 NOTE — Progress Notes (Signed)
Rule out myelodysplastic syndrome.

## 2021-03-04 DIAGNOSIS — M19012 Primary osteoarthritis, left shoulder: Secondary | ICD-10-CM | POA: Diagnosis not present

## 2021-03-07 DIAGNOSIS — M19012 Primary osteoarthritis, left shoulder: Secondary | ICD-10-CM | POA: Diagnosis not present

## 2021-03-10 DIAGNOSIS — M19012 Primary osteoarthritis, left shoulder: Secondary | ICD-10-CM | POA: Diagnosis not present

## 2021-03-14 ENCOUNTER — Telehealth: Payer: Self-pay | Admitting: Internal Medicine

## 2021-03-14 NOTE — Telephone Encounter (Signed)
Patient called and said the she is having her teeth cleaned and wondered that since she is taking Xarelto, should she stop taking it

## 2021-03-14 NOTE — Telephone Encounter (Signed)
Spoke with pt and advised Xarelto is not usually stopped for a teeth cleaning but if dentist has any concerns the office can submit for a pre-op clearance and if any additional dental work is going to be completed.  Pt verbalizes understanding and agrees with current plan.

## 2021-03-15 DIAGNOSIS — Z01419 Encounter for gynecological examination (general) (routine) without abnormal findings: Secondary | ICD-10-CM | POA: Diagnosis not present

## 2021-03-15 DIAGNOSIS — L304 Erythema intertrigo: Secondary | ICD-10-CM | POA: Diagnosis not present

## 2021-03-15 DIAGNOSIS — L821 Other seborrheic keratosis: Secondary | ICD-10-CM | POA: Diagnosis not present

## 2021-03-15 DIAGNOSIS — L820 Inflamed seborrheic keratosis: Secondary | ICD-10-CM | POA: Diagnosis not present

## 2021-03-15 DIAGNOSIS — M19012 Primary osteoarthritis, left shoulder: Secondary | ICD-10-CM | POA: Diagnosis not present

## 2021-03-17 DIAGNOSIS — M19012 Primary osteoarthritis, left shoulder: Secondary | ICD-10-CM | POA: Diagnosis not present

## 2021-03-22 DIAGNOSIS — M19012 Primary osteoarthritis, left shoulder: Secondary | ICD-10-CM | POA: Diagnosis not present

## 2021-03-25 DIAGNOSIS — M19012 Primary osteoarthritis, left shoulder: Secondary | ICD-10-CM | POA: Diagnosis not present

## 2021-03-28 DIAGNOSIS — M19012 Primary osteoarthritis, left shoulder: Secondary | ICD-10-CM | POA: Diagnosis not present

## 2021-03-28 DIAGNOSIS — Z6841 Body Mass Index (BMI) 40.0 and over, adult: Secondary | ICD-10-CM | POA: Diagnosis not present

## 2021-03-29 DIAGNOSIS — M19012 Primary osteoarthritis, left shoulder: Secondary | ICD-10-CM | POA: Diagnosis not present

## 2021-04-05 DIAGNOSIS — M19012 Primary osteoarthritis, left shoulder: Secondary | ICD-10-CM | POA: Diagnosis not present

## 2021-04-07 DIAGNOSIS — H35033 Hypertensive retinopathy, bilateral: Secondary | ICD-10-CM | POA: Diagnosis not present

## 2021-04-07 DIAGNOSIS — H25811 Combined forms of age-related cataract, right eye: Secondary | ICD-10-CM | POA: Diagnosis not present

## 2021-04-07 DIAGNOSIS — H04123 Dry eye syndrome of bilateral lacrimal glands: Secondary | ICD-10-CM | POA: Diagnosis not present

## 2021-04-07 DIAGNOSIS — H40023 Open angle with borderline findings, high risk, bilateral: Secondary | ICD-10-CM | POA: Diagnosis not present

## 2021-04-08 DIAGNOSIS — M19012 Primary osteoarthritis, left shoulder: Secondary | ICD-10-CM | POA: Diagnosis not present

## 2021-04-12 ENCOUNTER — Telehealth: Payer: Self-pay | Admitting: Internal Medicine

## 2021-04-12 DIAGNOSIS — M19012 Primary osteoarthritis, left shoulder: Secondary | ICD-10-CM | POA: Diagnosis not present

## 2021-04-12 NOTE — Telephone Encounter (Signed)
Pt c/o medication issue: ? ?1. Name of Medication: torsemide (DEMADEX) 20 MG tablet ? ?2. How are you currently taking this medication (dosage and times per day)? Take 1 tablet (20 mg total) by mouth every other day. ? ?3. Are you having a reaction (difficulty breathing--STAT)? no ? ?4. What is your medication issue? Causing her cramps in her hands and she also peeing a lot. Please advise.  ? ?Patient states that its very important to call her back today. ? ?

## 2021-04-13 NOTE — Telephone Encounter (Signed)
Attempted phone call to pt.  Per Epic ok to leave voicemail on home phone.  Pt advised per Tommye Standard, [name] of to try reducing Torsemide '20mg'$  - 1 tablet by mouth twice per week to see if this helps in reducing muscle cramps.  May contact RN at [phone] for further questions or concerns. ?

## 2021-04-13 NOTE — Telephone Encounter (Signed)
Spoke with pt who reports she has started having cramps in her hands and occasional leg cramping on the days she takes Torsemide '20mg'$  - she has been taking every other day as instructed.  She continues to have brisk urination taking the Torsemide.  She denies current edema, CP or SOB.  Pt advised Dr Caryl Comes is out of the office this week but will forward to Tommye Standard, [name] for review and recommendation.  Pt verbalizes understanding and agrees with current plan. ? ?

## 2021-04-14 DIAGNOSIS — M19012 Primary osteoarthritis, left shoulder: Secondary | ICD-10-CM | POA: Diagnosis not present

## 2021-04-19 DIAGNOSIS — J45909 Unspecified asthma, uncomplicated: Secondary | ICD-10-CM | POA: Diagnosis not present

## 2021-04-19 DIAGNOSIS — R051 Acute cough: Secondary | ICD-10-CM | POA: Diagnosis not present

## 2021-04-19 DIAGNOSIS — R5383 Other fatigue: Secondary | ICD-10-CM | POA: Diagnosis not present

## 2021-04-19 DIAGNOSIS — Z1152 Encounter for screening for COVID-19: Secondary | ICD-10-CM | POA: Diagnosis not present

## 2021-04-19 DIAGNOSIS — R0981 Nasal congestion: Secondary | ICD-10-CM | POA: Diagnosis not present

## 2021-04-19 DIAGNOSIS — M19012 Primary osteoarthritis, left shoulder: Secondary | ICD-10-CM | POA: Diagnosis not present

## 2021-04-19 DIAGNOSIS — J309 Allergic rhinitis, unspecified: Secondary | ICD-10-CM | POA: Diagnosis not present

## 2021-04-21 DIAGNOSIS — M19012 Primary osteoarthritis, left shoulder: Secondary | ICD-10-CM | POA: Diagnosis not present

## 2021-04-25 DIAGNOSIS — M19012 Primary osteoarthritis, left shoulder: Secondary | ICD-10-CM | POA: Diagnosis not present

## 2021-05-03 ENCOUNTER — Other Ambulatory Visit: Payer: Self-pay

## 2021-05-03 ENCOUNTER — Inpatient Hospital Stay: Payer: Medicare Other

## 2021-05-03 ENCOUNTER — Inpatient Hospital Stay: Payer: Medicare Other | Attending: Internal Medicine | Admitting: Internal Medicine

## 2021-05-03 VITALS — BP 136/69 | HR 73 | Temp 97.9°F | Resp 18 | Ht 68.5 in | Wt 235.2 lb

## 2021-05-03 DIAGNOSIS — D509 Iron deficiency anemia, unspecified: Secondary | ICD-10-CM | POA: Diagnosis not present

## 2021-05-03 DIAGNOSIS — D72829 Elevated white blood cell count, unspecified: Secondary | ICD-10-CM | POA: Diagnosis not present

## 2021-05-03 DIAGNOSIS — M8580 Other specified disorders of bone density and structure, unspecified site: Secondary | ICD-10-CM | POA: Diagnosis not present

## 2021-05-03 DIAGNOSIS — D539 Nutritional anemia, unspecified: Secondary | ICD-10-CM

## 2021-05-03 DIAGNOSIS — R5383 Other fatigue: Secondary | ICD-10-CM | POA: Diagnosis not present

## 2021-05-03 DIAGNOSIS — I10 Essential (primary) hypertension: Secondary | ICD-10-CM | POA: Insufficient documentation

## 2021-05-03 DIAGNOSIS — K219 Gastro-esophageal reflux disease without esophagitis: Secondary | ICD-10-CM | POA: Insufficient documentation

## 2021-05-03 DIAGNOSIS — Z7901 Long term (current) use of anticoagulants: Secondary | ICD-10-CM | POA: Diagnosis not present

## 2021-05-03 DIAGNOSIS — J45909 Unspecified asthma, uncomplicated: Secondary | ICD-10-CM | POA: Insufficient documentation

## 2021-05-03 DIAGNOSIS — Z79899 Other long term (current) drug therapy: Secondary | ICD-10-CM | POA: Insufficient documentation

## 2021-05-03 LAB — IRON AND IRON BINDING CAPACITY (CC-WL,HP ONLY)
Iron: 67 ug/dL (ref 28–170)
Saturation Ratios: 19 % (ref 10.4–31.8)
TIBC: 346 ug/dL (ref 250–450)
UIBC: 279 ug/dL (ref 148–442)

## 2021-05-03 LAB — CBC WITH DIFFERENTIAL (CANCER CENTER ONLY)
Abs Immature Granulocytes: 0.01 10*3/uL (ref 0.00–0.07)
Basophils Absolute: 0 10*3/uL (ref 0.0–0.1)
Basophils Relative: 1 %
Eosinophils Absolute: 0.1 10*3/uL (ref 0.0–0.5)
Eosinophils Relative: 2 %
HCT: 35.8 % — ABNORMAL LOW (ref 36.0–46.0)
Hemoglobin: 11.5 g/dL — ABNORMAL LOW (ref 12.0–15.0)
Immature Granulocytes: 0 %
Lymphocytes Relative: 25 %
Lymphs Abs: 1 10*3/uL (ref 0.7–4.0)
MCH: 26.7 pg (ref 26.0–34.0)
MCHC: 32.1 g/dL (ref 30.0–36.0)
MCV: 83.3 fL (ref 80.0–100.0)
Monocytes Absolute: 0.5 10*3/uL (ref 0.1–1.0)
Monocytes Relative: 13 %
Neutro Abs: 2.3 10*3/uL (ref 1.7–7.7)
Neutrophils Relative %: 59 %
Platelet Count: 219 10*3/uL (ref 150–400)
RBC: 4.3 MIL/uL (ref 3.87–5.11)
RDW: 16.2 % — ABNORMAL HIGH (ref 11.5–15.5)
WBC Count: 3.9 10*3/uL — ABNORMAL LOW (ref 4.0–10.5)
nRBC: 0 % (ref 0.0–0.2)

## 2021-05-03 LAB — FERRITIN: Ferritin: 34 ng/mL (ref 11–307)

## 2021-05-03 NOTE — Progress Notes (Signed)
?    [facility] ?[phone]   [phone] ? ?OFFICE PROGRESS NOTE ? ?[name], MD ?[address] ?[place] [address] ? ?DIAGNOSIS:  Leukocytopenia and mild anemia.  Her leukocytopenia is likely drug-induced from her current treatment rosuvastatin.  She also has mild anemia of chronic disease.   ? ?PRIOR THERAPY:None ? ?CURRENT THERAPY: None ? ?INTERVAL HISTORY: ?Bridget Cortez 76 y.o. female returns to the clinic today for follow-up visit accompanied by her husband.  The patient is feeling fine today with no concerning complaints except for very mild fatigue.  She denied having any current chest pain, shortness of breath except with exertion with no cough or hemoptysis.  She has no nausea, vomiting, diarrhea or constipation.  She has no headache or visual changes.  She is here today for evaluation and repeat blood work. ?  ?MEDICAL HISTORY: ?Past Medical History:  ?Diagnosis Date  ? Abnormal Pap smear 2006  ? vaginal medicine according to patient  ? Allergy   ? Arthritis   ? Asthma   ? 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 Breast cancer ([place]) 1999  ? Left  ? CAP (community acquired pneumonia) 08/06/2014  ? Cataract   ? cataracts lt. eye    cataract removed.  ? Chiari malformation   ? Diverticulosis   ? Dysrhythmia   ? SVT  ? GERD (gastroesophageal reflux disease)   ? H/O osteopenia   ? 08/2006  ? H/O varicella   ? Heart murmur   ? Hypertension   ? Monilial vaginitis 07/2007  ? Multiple allergies   ? Ovarian cyst   ? Personal history of colonic adenomas 03/13/2012  ? Personal history of radiation therapy 1990  ? Pneumonia 07/2014  ? VIN III (vulvar intraepithelial neoplasia III) 03/2009  ? Yeast infection   ? ? ?ALLERGIES:  is allergic to ephedrine and cortisone. ? ?MEDICATIONS:  ?Current Outpatient Medications  ?Medication Sig Dispense Refill  ? acetaminophen (TYLENOL) 325 MG tablet Take 2 tablets (650 mg total) by mouth every 6 (six) hours as needed for mild pain, fever or  headache (or Fever >/= 101).    ? albuterol (PROVENTIL) (5 MG/ML) 0.5% nebulizer solution Take 0.5 mLs (2.5 mg total) by nebulization every 6 (six) hours as needed for wheezing or shortness of breath. 20 mL 12  ? amLODipine (NORVASC) 10 MG tablet Take 5 mg by mouth at bedtime.     ? Calcium Carbonate-Vitamin D (CALTRATE 600+D PO) Take 1 tablet by mouth daily with lunch.     ? cetirizine (ZYRTEC) 10 MG tablet Take 10 mg by mouth daily.    ? esomeprazole (NEXIUM) 40 MG capsule Take 40 mg by mouth 2 (two) times daily before a meal.     ? fluticasone (FLONASE) 50 MCG/ACT nasal spray Place 1 spray into both nostrils at bedtime.     ? Fluticasone-Salmeterol (ADVAIR) 250-50 MCG/DOSE AEPB Inhale 1 puff into the lungs every 12 (twelve) hours.    ? Multiple Vitamin (MULTIVITAMIN) tablet Take 1 tablet by mouth daily with lunch.     ? nebivolol (BYSTOLIC) 5 MG tablet Take 1 tablet by mouth once daily 90 tablet 3  ? potassium chloride SA (KLOR-CON) 20 MEQ tablet Take 20 mEq by mouth 3 (three) times daily.    ? rivaroxaban (XARELTO) 20 MG TABS tablet Take 1 tablet (20 mg total) by mouth daily with supper. 90 tablet 1  ? rosuvastatin (CRESTOR) 5 MG tablet Take 5 mg by mouth  daily.    ? simethicone (MYLICON) 885 MG chewable tablet Chew 125 mg by mouth every 6 (six) hours as needed for flatulence.    ? telmisartan (MICARDIS) 40 MG tablet Take 40 mg by mouth daily.    ? torsemide (DEMADEX) 20 MG tablet Take 1 tablet (20 mg total) by mouth every other day. 90 tablet 3  ? traMADol (ULTRAM) 50 MG tablet Take 50 mg by mouth as needed.    ? traZODone (DESYREL) 50 MG tablet Take 25 mg by mouth at bedtime as needed for sleep.    ? triamcinolone cream (KENALOG) 0.1 % Apply 1 application topically daily as needed ([place]).    ? vitamin C (ASCORBIC ACID) 500 MG tablet Take 500 mg by mouth daily.    ? ?No current facility-administered medications for this visit.  ? ? ?SURGICAL HISTORY:  ?Past Surgical History:  ?Procedure Laterality Date  ?  ABDOMINAL HYSTERECTOMY    ? ABDOMINAL SURGERY    ? BREAST EXCISIONAL BIOPSY Right   ? benign  ? BREAST LUMPECTOMY Left 1990  ? BREAST SURGERY    ? Lumpectomy, left breast  ? CARDIOVERSION N/A 07/28/2020  ? Procedure: CARDIOVERSION;  Surgeon: Donato Heinz, MD;  Location: [facility];  Service: Cardiovascular;  Laterality: N/A;  ? CATARACT EXTRACTION Left 2018  ? COLONOSCOPY    ? COLONOSCOPY W/ BIOPSIES    ? EYE SURGERY    ? HERNIA REPAIR    ? KNEE SURGERY    ? right  ? lt. eye aneurysm surgery Left 2017  ? MOUTH SURGERY  2019  ? rt. side gum surgery  Lt. done 3 years ago  ? RADIOACTIVE SEED GUIDED EXCISIONAL BREAST BIOPSY Right 07/09/2018  ? Procedure: RADIOACTIVE SEED GUIDED EXCISIONAL RIGHT  BREAST BIOPSY;  Surgeon: Stark Klein, MD;  Location: [facility];  Service: General;  Laterality: Right;  ? TOTAL KNEE ARTHROPLASTY Right 09/09/2019  ? Procedure: RIGHT TOTAL KNEE ARTHROPLASTY;  Surgeon: Melrose Nakayama, MD;  Location: [facility];  Service: Orthopedics;  Laterality: Right;  ? ? ?REVIEW OF SYSTEMS:  A comprehensive review of systems was negative except for: Constitutional: positive for fatigue  ? ?PHYSICAL EXAMINATION: General appearance: alert, cooperative, fatigued, and no distress ?Head: Normocephalic, without obvious abnormality, atraumatic ?Neck: no adenopathy, no JVD, supple, symmetrical, trachea midline, and thyroid not enlarged, symmetric, no tenderness/mass/nodules ?Lymph nodes: Cervical, supraclavicular, and axillary nodes normal. ?Resp: clear to auscultation bilaterally ?Back: symmetric, no curvature. ROM normal. No CVA tenderness. ?Cardio: regular rate and rhythm, S1, S2 normal, no murmur, click, rub or gallop ?GI: soft, non-tender; bowel sounds normal; no masses,  no organomegaly ?Extremities: extremities normal, atraumatic, no cyanosis or edema ? ?ECOG PERFORMANCE STATUS: 1 - Symptomatic but completely ambulatory ? ?Blood pressure 136/69, pulse 73, temperature 97.9 ?F (36.6 ?C), temperature source  Tympanic, resp. rate 18, height 5' 8.5" (1.74 m), weight 235 lb 3.2 oz (106.7 kg), SpO2 100 %. ? ?LABORATORY DATA: ?Lab Results  ?Component Value Date  ? WBC 3.9 (L) 05/03/2021  ? HGB 11.5 (L) 05/03/2021  ? HCT 35.8 (L) 05/03/2021  ? MCV 83.3 05/03/2021  ? PLT 219 05/03/2021  ? ? ?  Chemistry   ?   ?Component Value Date/Time  ? NA 135 09/15/2020 1121  ? K 3.6 09/15/2020 1121  ? CL 101 09/15/2020 1121  ? CO2 26 09/15/2020 1121  ? BUN 11 09/15/2020 1121  ? CREATININE 0.79 09/15/2020 1121  ?    ?Component Value Date/Time  ? CALCIUM 9.2  09/15/2020 1121  ? ALKPHOS 87 09/15/2020 1121  ? AST 18 09/15/2020 1121  ? ALT 14 09/15/2020 1121  ? BILITOT 0.5 09/15/2020 1121  ?  ? ? ? ?RADIOGRAPHIC STUDIES: ?No results found. ? ?ASSESSMENT AND PLAN: This is a very pleasant 76 years old African-American female with persistent leukocytopenia and anemia of unclear etiology.  It was initially thought to be drug-induced but there is no improvement in her condition. ?The patient underwent a bone marrow biopsy and aspirate.  I personally discussed the result with the patient and her husband.  Her biopsy showed no concerning bone marrow abnormality except for a slight increase of plasma cells but representing 4%. ?The patient is currently on observation but she is taking multivitamins as well as vitamin C and Caltrate. ?Repeat CBC today showed improvement of her total white blood count up to 3.9.  Her hemoglobin is also better at 11.5 and hematocrit 35.8% with normal platelet count of 219,000.  Iron study and ferritin are normal. ?I recommended for the patient to continue on observation for now. ?I will see her back for follow-up visit in 6 months for evaluation and repeat blood work. ?She was advised to call immediately if she has any other concerning symptoms in the interval. ?The patient voices understanding of current disease status and treatment options and is in agreement with the current care plan. ? ?All questions were answered.  The patient knows to call the clinic with any problems, questions or concerns. We can certainly see the patient much sooner if necessary. ? ? ?Disclaimer: This note was dictated with voice recognition softw

## 2021-05-05 DIAGNOSIS — M19012 Primary osteoarthritis, left shoulder: Secondary | ICD-10-CM | POA: Diagnosis not present

## 2021-05-06 LAB — PROTEIN ELECTROPHORESIS, SERUM, WITH REFLEX
A/G Ratio: 1.2 (ref 0.7–1.7)
Albumin ELP: 3.5 g/dL (ref 2.9–4.4)
Alpha-1-Globulin: 0.2 g/dL (ref 0.0–0.4)
Alpha-2-Globulin: 0.7 g/dL (ref 0.4–1.0)
Beta Globulin: 1 g/dL (ref 0.7–1.3)
Gamma Globulin: 1.1 g/dL (ref 0.4–1.8)
Globulin, Total: 3 g/dL (ref 2.2–3.9)
M-Spike, %: 0.3 g/dL — ABNORMAL HIGH
SPEP Interpretation: 0
Total Protein ELP: 6.5 g/dL (ref 6.0–8.5)

## 2021-05-06 LAB — IMMUNOFIXATION REFLEX, SERUM
IgA: 332 mg/dL (ref 64–422)
IgG (Immunoglobin G), Serum: 1259 mg/dL (ref 586–1602)
IgM (Immunoglobulin M), Srm: 77 mg/dL (ref 26–217)

## 2021-05-10 DIAGNOSIS — M19012 Primary osteoarthritis, left shoulder: Secondary | ICD-10-CM | POA: Diagnosis not present

## 2021-05-16 DIAGNOSIS — M19012 Primary osteoarthritis, left shoulder: Secondary | ICD-10-CM | POA: Diagnosis not present

## 2021-05-24 DIAGNOSIS — M19012 Primary osteoarthritis, left shoulder: Secondary | ICD-10-CM | POA: Diagnosis not present

## 2021-05-25 DIAGNOSIS — I10 Essential (primary) hypertension: Secondary | ICD-10-CM | POA: Diagnosis not present

## 2021-05-25 DIAGNOSIS — E785 Hyperlipidemia, unspecified: Secondary | ICD-10-CM | POA: Diagnosis not present

## 2021-05-26 ENCOUNTER — Encounter: Payer: Self-pay | Admitting: Internal Medicine

## 2021-05-26 ENCOUNTER — Ambulatory Visit (INDEPENDENT_AMBULATORY_CARE_PROVIDER_SITE_OTHER): Payer: Medicare Other | Admitting: Internal Medicine

## 2021-05-26 VITALS — BP 140/80 | HR 88 | Ht 68.5 in | Wt 239.0 lb

## 2021-05-26 DIAGNOSIS — I5030 Unspecified diastolic (congestive) heart failure: Secondary | ICD-10-CM | POA: Diagnosis not present

## 2021-05-26 DIAGNOSIS — I471 Supraventricular tachycardia: Secondary | ICD-10-CM | POA: Diagnosis not present

## 2021-05-26 DIAGNOSIS — R000 Tachycardia, unspecified: Secondary | ICD-10-CM | POA: Diagnosis not present

## 2021-05-26 DIAGNOSIS — I4892 Unspecified atrial flutter: Secondary | ICD-10-CM | POA: Diagnosis not present

## 2021-05-26 MED ORDER — TORSEMIDE 20 MG PO TABS: ORAL_TABLET | ORAL | 3 refills | Status: DC

## 2021-05-26 NOTE — Patient Instructions (Addendum)
Medication Instructions:  ?- Your physician has recommended you make the following change in your medication:  ? ?1) CHANGE Torsemide 20 mg: ?- take 1 tablet (20 mg) by mouth 4 days a week ? ?*If you need a refill on your cardiac medications before your next appointment, please call your pharmacy* ? ? ?Lab Work: ?- none ordered ? ?If you have labs (blood work) drawn today and your tests are completely normal, you will receive your results only by: ?MyChart Message (if you have MyChart) OR ?A paper copy in the mail ?If you have any lab test that is abnormal or we need to change your treatment, we will call you to review the results. ? ? ?Testing/Procedures: ?- none ordered ? ? ?Follow-Up: ?At [facility] you and your health needs are our priority.  As part of our continuing mission to provide you with exceptional heart care, we have created designated Provider Care Teams.  These Care Teams include your primary Cardiologist (physician) and Advanced Practice Providers (APPs -  Physician Assistants and Nurse Practitioners) who all work together to provide you with the care you need, when you need it. ? ?We recommend signing up for the patient portal called "MyChart".  Sign up information is provided on this After Visit Summary.  MyChart is used to connect with patients for Virtual Visits (Telemedicine).  Patients are able to view lab/test results, encounter notes, upcoming appointments, etc.  Non-urgent messages can be sent to your provider as well.   ?To learn more about what you can do with MyChart, go to [email or web address].   ? ?Your next appointment:   ?6 month(s) ? ?The format for your next appointment:   ?In Person ? ?Provider:   ?Virl Axe, MD  [facility] office) ? ? ?Other Instructions ? ?Your dental office may fax any requests to [phone] ? ?Important Information About [place] ? ? ? ? ? ? ?

## 2021-05-26 NOTE — Progress Notes (Signed)
? ? ? ? ?Patient Care Team: ?[name], MD as PCP - General (Internal Medicine) ?Burnell Blanks, MD as PCP - Cardiology (Cardiology) ? ? ?HPI ? ?Bridget Cortez is a 76 y.o. female ?Seen in followup for [facility] which had ECG features of VT (concordance//aVR) but initiation and termination sequences suggestive of SVT--GT agreed more likely SVT; subsequently, 5/22, identified with atrial flutter >> atrial fib and started on anticoagulation. DCCV 7/22 ? ?Rx with amlodipine>> metoprolol ?  ?   ?4th COVID Booster taken-Covid + April 2022. Post symptoms are shortness of breath.    ? ?Struggling with cold intolerance, changed anticoagulation Apixaban  >> Rivaroxaban much less cold issue, no bleeding  ? ?The patient denies chest pain.  There have been no palpitations, lightheadedness or syncope.  Complains of chronic stable dyspnea.  Some peripheral edema.  Finds the diuretics have been a challenge, she was taking them regardless of her activities and she was unable to control her bladder when she was out.  Soiling self.  Better on 2 days a week.  Typically has an effect that lasts about 6 hours.  ? ?Has dental cleaning and bridgework pending ? ?DATE TEST EF  ?8/16 Echo   55-65 %  ?7/19 Echo   55-60 %  ? ?Date Cr K Hgb  ?2/20 0.86 4.1 12.3  ?6/20  0.62 3.8   ?8/21 0.61 4.0 12.8    ?3/22 0.54 3.7 12.6  ?8/22 0.79 3.6 11.4  ? ?Anemia w/u ferritin 110, Fe Sat 22%; eval by Heme-Onc  ?  ? ?Past Medical History:  ?Diagnosis Date  ? Abnormal Pap smear 2006  ? vaginal medicine according to patient  ? Allergy   ? Arthritis   ? Asthma   ? 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 Breast cancer ([place]) 1999  ? Left  ? CAP (community acquired pneumonia) 08/06/2014  ? Cataract   ? cataracts lt. eye    cataract removed.  ? Chiari malformation   ? Diverticulosis   ? Dysrhythmia   ? SVT  ? GERD (gastroesophageal reflux disease)   ? H/O osteopenia   ? 08/2006  ? H/O varicella   ? Heart murmur   ? Hypertension   ? Monilial vaginitis  07/2007  ? Multiple allergies   ? Ovarian cyst   ? Personal history of colonic adenomas 03/13/2012  ? Personal history of radiation therapy 1990  ? Pneumonia 07/2014  ? VIN III (vulvar intraepithelial neoplasia III) 03/2009  ? Yeast infection   ? ? ?Past Surgical History:  ?Procedure Laterality Date  ? ABDOMINAL HYSTERECTOMY    ? ABDOMINAL SURGERY    ? BREAST EXCISIONAL BIOPSY Right   ? benign  ? BREAST LUMPECTOMY Left 1990  ? BREAST SURGERY    ? Lumpectomy, left breast  ? CARDIOVERSION N/A 07/28/2020  ? Procedure: CARDIOVERSION;  Surgeon: Donato Heinz, MD;  Location: [facility];  Service: Cardiovascular;  Laterality: N/A;  ? CATARACT EXTRACTION Left 2018  ? COLONOSCOPY    ? COLONOSCOPY W/ BIOPSIES    ? EYE SURGERY    ? HERNIA REPAIR    ? KNEE SURGERY    ? right  ? lt. eye aneurysm surgery Left 2017  ? MOUTH SURGERY  2019  ? rt. side gum surgery  Lt. done 3 years ago  ? RADIOACTIVE SEED GUIDED EXCISIONAL BREAST BIOPSY Right 07/09/2018  ? Procedure: RADIOACTIVE SEED GUIDED EXCISIONAL RIGHT  BREAST BIOPSY;  Surgeon: Stark Candance Bohlman, MD;  Location: [facility];  Service: General;  Laterality: Right;  ? TOTAL KNEE ARTHROPLASTY Right 09/09/2019  ? Procedure: RIGHT TOTAL KNEE ARTHROPLASTY;  Surgeon: Melrose Nakayama, MD;  Location[facility];  Service: Orthopedics;  Laterality: Right;  ? ? ?Current Meds  ?Medication Sig  ? acetaminophen (TYLENOL) 325 MG tablet Take 2 tablets (650 mg total) by mouth every 6 (six) hours as needed for mild pain, fever or headache (or Fever >/= 101).  ? albuterol (PROVENTIL) (5 MG/ML) 0.5% nebulizer solution Take 0.5 mLs (2.5 mg total) by nebulization every 6 (six) hours as needed for wheezing or shortness of breath. (Patient taking differently: Take 2.5 mg by nebulization daily as needed for wheezing or shortness of breath.)  ? amLODipine (NORVASC) 10 MG tablet Take 5 mg by mouth at bedtime.   ? Calcium Carbonate-Vitamin D (CALTRATE 600+D PO) Take 1 tablet by mouth daily with lunch.   ? cetirizine  (ZYRTEC) 10 MG tablet Take 10 mg by mouth daily.  ? esomeprazole (NEXIUM) 40 MG capsule Take 40 mg by mouth 2 (two) times daily before a meal.   ? fluticasone (FLONASE) 50 MCG/ACT nasal spray Place 1 spray into both nostrils at bedtime.   ? Fluticasone-Salmeterol (ADVAIR) 250-50 MCG/DOSE AEPB Inhale 1 puff into the lungs every 12 (twelve) hours.  ? Multiple Vitamin (MULTIVITAMIN) tablet Take 1 tablet by mouth daily with lunch.   ? nebivolol (BYSTOLIC) 5 MG tablet Take 1 tablet by mouth once daily  ? potassium chloride SA (KLOR-CON) 20 MEQ tablet Take 20 mEq by mouth 3 (three) times daily.  ? rivaroxaban (XARELTO) 20 MG TABS tablet Take 1 tablet (20 mg total) by mouth daily with supper.  ? rosuvastatin (CRESTOR) 5 MG tablet Take 5 mg by mouth daily.  ? telmisartan (MICARDIS) 40 MG tablet Take 40 mg by mouth daily.  ? torsemide (DEMADEX) 20 MG tablet Take 1 tablet (20 mg total) by mouth every other day.  ? vitamin C (ASCORBIC ACID) 500 MG tablet Take 500 mg by mouth daily.  ? ? ?Allergies  ?Allergen Reactions  ? Ephedrine Shortness Of Breath  ? Cortisone Other (See Comments)  ?  Severe muscle spasms  ? ? ?Review of Systems negative except from HPI and PMH ?BP 140/80 (BP Location: Left Arm, Patient Position: Sitting, Cuff Size: Normal)   Pulse 88   Ht 5' 8.5" (1.74 m)   Wt 239 lb (108.4 kg)   SpO2 98%   BMI 35.81 kg/m?   ?Well developed and well nourished in no acute distress ?HENT normal ?Neck supple   ?Clear ?Device pocket well healed; without hematoma or erythema.  There is no tethering  ?Regular rate and rhythm, no  gallop No murmur ?Abd-soft with active BS ?No Clubbing cyanosis 1+ edema ?Skin-warm and dry ?A & Oriented  Grossly normal sensory and motor function ? ?ECG sinus at 88 ?18/10/39 ?Poor R wave progression ?Voltage criteria for LVH ? ?Assessment and  Plan ? ?WCT probably SVT ? ?Atrial flutter-Atrial fib Dx 5/22  ? ?HTN ? ?HFpEF chronic ? ?Anxiety ? ?Cold intolerance improved ? ?  ?She remains somewhat  volume overloaded, I think we went too far in reducing her torsemide frequency; we will increase it from 20 mg twice a week to 20 mg 4 times a week. ? ?Blood pressure is reasonably controlled.  We will continue her on amlodipine 5, Bystolic 5 and Micardis 40. ? ?No interval atrial fibrillation of which she is aware.  Continue on Xarelto 20 eGFR by CKD-EPI was 65,  so dose is appropriate.  At the time of her dental procedure, would anticipate holding Xarelto for 2 doses ? ? ?  ? ?

## 2021-05-30 DIAGNOSIS — J45909 Unspecified asthma, uncomplicated: Secondary | ICD-10-CM | POA: Diagnosis not present

## 2021-05-30 DIAGNOSIS — J209 Acute bronchitis, unspecified: Secondary | ICD-10-CM | POA: Diagnosis not present

## 2021-05-30 DIAGNOSIS — J309 Allergic rhinitis, unspecified: Secondary | ICD-10-CM | POA: Diagnosis not present

## 2021-05-30 DIAGNOSIS — Z1152 Encounter for screening for COVID-19: Secondary | ICD-10-CM | POA: Diagnosis not present

## 2021-05-30 DIAGNOSIS — R051 Acute cough: Secondary | ICD-10-CM | POA: Diagnosis not present

## 2021-06-02 DIAGNOSIS — H34219 Partial retinal artery occlusion, unspecified eye: Secondary | ICD-10-CM | POA: Diagnosis not present

## 2021-06-02 DIAGNOSIS — I4891 Unspecified atrial fibrillation: Secondary | ICD-10-CM | POA: Diagnosis not present

## 2021-06-02 DIAGNOSIS — I10 Essential (primary) hypertension: Secondary | ICD-10-CM | POA: Diagnosis not present

## 2021-06-02 DIAGNOSIS — M25512 Pain in left shoulder: Secondary | ICD-10-CM | POA: Diagnosis not present

## 2021-06-02 DIAGNOSIS — J209 Acute bronchitis, unspecified: Secondary | ICD-10-CM | POA: Diagnosis not present

## 2021-06-02 DIAGNOSIS — F419 Anxiety disorder, unspecified: Secondary | ICD-10-CM | POA: Diagnosis not present

## 2021-06-02 DIAGNOSIS — Z0000 Encounter for general adult medical examination without abnormal findings: Secondary | ICD-10-CM | POA: Diagnosis not present

## 2021-06-02 DIAGNOSIS — D72819 Decreased white blood cell count, unspecified: Secondary | ICD-10-CM | POA: Diagnosis not present

## 2021-06-02 DIAGNOSIS — J45909 Unspecified asthma, uncomplicated: Secondary | ICD-10-CM | POA: Diagnosis not present

## 2021-06-02 DIAGNOSIS — R82998 Other abnormal findings in urine: Secondary | ICD-10-CM | POA: Diagnosis not present

## 2021-06-02 DIAGNOSIS — M1990 Unspecified osteoarthritis, unspecified site: Secondary | ICD-10-CM | POA: Diagnosis not present

## 2021-06-02 DIAGNOSIS — E782 Mixed hyperlipidemia: Secondary | ICD-10-CM | POA: Diagnosis not present

## 2021-06-02 DIAGNOSIS — Q078 Other specified congenital malformations of nervous system: Secondary | ICD-10-CM | POA: Diagnosis not present

## 2021-06-07 DIAGNOSIS — M19012 Primary osteoarthritis, left shoulder: Secondary | ICD-10-CM | POA: Diagnosis not present

## 2021-06-13 DIAGNOSIS — M19012 Primary osteoarthritis, left shoulder: Secondary | ICD-10-CM | POA: Diagnosis not present

## 2021-06-14 ENCOUNTER — Ambulatory Visit: Payer: Medicare Other | Admitting: Internal Medicine

## 2021-06-18 ENCOUNTER — Other Ambulatory Visit: Payer: Self-pay | Admitting: Internal Medicine

## 2021-06-18 DIAGNOSIS — I4892 Unspecified atrial flutter: Secondary | ICD-10-CM

## 2021-06-21 DIAGNOSIS — M19012 Primary osteoarthritis, left shoulder: Secondary | ICD-10-CM | POA: Diagnosis not present

## 2021-06-21 NOTE — Telephone Encounter (Signed)
Age 76, weight 108kg, SCr 0.79 on 08/2020, CrCl > 100 Last OV 05/2021, afib indication

## 2021-06-24 ENCOUNTER — Encounter (INDEPENDENT_AMBULATORY_CARE_PROVIDER_SITE_OTHER): Payer: Medicare Other | Admitting: Ophthalmology

## 2021-06-24 DIAGNOSIS — H3509 Other intraretinal microvascular abnormalities: Secondary | ICD-10-CM

## 2021-06-24 DIAGNOSIS — H35033 Hypertensive retinopathy, bilateral: Secondary | ICD-10-CM | POA: Diagnosis not present

## 2021-06-24 DIAGNOSIS — H43813 Vitreous degeneration, bilateral: Secondary | ICD-10-CM | POA: Diagnosis not present

## 2021-06-24 DIAGNOSIS — I10 Essential (primary) hypertension: Secondary | ICD-10-CM | POA: Diagnosis not present

## 2021-06-30 ENCOUNTER — Other Ambulatory Visit: Payer: Self-pay | Admitting: Cardiovascular Disease

## 2021-07-01 NOTE — Telephone Encounter (Signed)
Please advise if OK to refill under Dr. Caryl Comes. Thank you!

## 2021-07-05 ENCOUNTER — Other Ambulatory Visit: Payer: Self-pay

## 2021-07-05 MED ORDER — NEBIVOLOL HCL 5 MG PO TABS: 5.0000 mg | ORAL_TABLET | Freq: Every day | ORAL | 1 refills | Status: DC

## 2021-07-05 NOTE — Telephone Encounter (Signed)
Please advise if OK to refill under Dr. Caryl Comes. Thank you!

## 2021-07-05 NOTE — Telephone Encounter (Signed)
This is a [place] pt 

## 2021-08-10 DIAGNOSIS — M1712 Unilateral primary osteoarthritis, left knee: Secondary | ICD-10-CM | POA: Diagnosis not present

## 2021-08-23 DIAGNOSIS — Z23 Encounter for immunization: Secondary | ICD-10-CM | POA: Diagnosis not present

## 2021-09-19 ENCOUNTER — Other Ambulatory Visit: Payer: Self-pay | Admitting: Internal Medicine

## 2021-09-29 ENCOUNTER — Ambulatory Visit: Payer: Medicare Other | Attending: Orthopaedic Surgery

## 2021-09-29 DIAGNOSIS — G8929 Other chronic pain: Secondary | ICD-10-CM | POA: Diagnosis not present

## 2021-09-29 DIAGNOSIS — R279 Unspecified lack of coordination: Secondary | ICD-10-CM | POA: Diagnosis not present

## 2021-09-29 DIAGNOSIS — M25562 Pain in left knee: Secondary | ICD-10-CM | POA: Diagnosis not present

## 2021-09-29 DIAGNOSIS — M6281 Muscle weakness (generalized): Secondary | ICD-10-CM | POA: Diagnosis not present

## 2021-09-29 DIAGNOSIS — R2689 Other abnormalities of gait and mobility: Secondary | ICD-10-CM | POA: Diagnosis not present

## 2021-09-29 NOTE — Therapy (Addendum)
OUTPATIENT PHYSICAL THERAPY LOWER EXTREMITY EVALUATION   Patient Name: Bridget Cortez MRN: [id number] DOB:18-Dec-1945, 76 y.o., female Today's Date: 09/29/2021    Past Medical History:  Diagnosis Date   Abnormal Pap smear 2006   vaginal medicine according to patient   Allergy    Arthritis    Asthma    Atrophy of vagina 06/2005   Breast cancer ([place]) 1999   Left   CAP (community acquired pneumonia) 08/06/2014   Cataract    cataracts lt. eye    cataract removed.   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 eye aneurysm surgery Left 2017   MOUTH SURGERY  2019   rt. side gum surgery  Lt. done 3 years ago   RADIOACTIVE SEED GUIDED EXCISIONAL BREAST BIOPSY Right 07/09/2018   Procedure: RADIOACTIVE SEED GUIDED EXCISIONAL RIGHT  BREAST BIOPSY;  Surgeon: Stark Klein, MD;  Location: [facility];  Service: General;  Laterality: Right;   TOTAL KNEE ARTHROPLASTY Right 09/09/2019   Procedure: RIGHT TOTAL KNEE ARTHROPLASTY;  Surgeon: Melrose Nakayama, MD;  Location: [facility];   Service: Orthopedics;  Laterality: Right;   Patient Active Problem List   Diagnosis Date Noted   Leukocytopenia 09/15/2020   Deficiency anemia 09/15/2020   Persistent atrial fibrillation (HCC)    Primary osteoarthritis of right knee 09/09/2019   (HFpEF) heart failure with preserved ejection fraction ([place]) 04/13/2019   SVT (supraventricular tachycardia) ([place]) 08/04/2017   Wide-complex tachycardia 08/03/2017   Asthma 08/06/2014   Anxiety about health 08/06/2014   Essential hypertension 08/06/2014   Personal history of colonic adenomas 03/20/2012   Chiari malformation    Heart murmur    H/O varicella    Yeast infection    Cancer ([place])    H/O osteopenia    Abnormal Pap smear    Vulvar intraepithelial neoplasia III (VIN III) 04/12/2009   Atrophy of vagina 06/23/2005    PCP: Steva Ready Avva  REFERRING PROVIDER: Melrose Nakayama  REFERRING DIAG: L knee pain sp R TKA in 2021  THERAPY DIAG:  No diagnosis found.  Rationale for Evaluation and Treatment Rehabilitation  ONSET DATE: 09/13/21  SUBJECTIVE:   SUBJECTIVE STATEMENT: 3 weeks I got a steroid shot in my left knee and I fell the day after picking up a package from the front door and coming back inside. I feel like  if I had a taller walking stick I could walk better.   PERTINENT HISTORY: R TKA, heart failure, edema in ankles, HTN, Chiari malformation   PAIN:  Are you having pain? Yes: NPRS scale: 7/10 Pain location: L knee Pain description: dull, achy  Aggravating factors: standing and walking for too long  Relieving factors: rubbing it with alcohol, Voltaren   PRECAUTIONS: Fall  WEIGHT BEARING RESTRICTIONS No  FALLS:  Has patient fallen in last 6 months? Yes. Number of falls 1  LIVING ENVIRONMENT: Lives with: lives with their spouse Lives in: House/apartment Stairs: Yes: External: 3 steps; on right going up Has following equipment at home: Single point cane and Walker - 2 wheeled  OCCUPATION:  retired  PLOF: Independent  PATIENT GOALS to be able to stand and fix my posture    OBJECTIVE:   PATIENT SURVEYS:  FOTO 38/100  COGNITION:  Overall cognitive status: Within functional limits for tasks assessed     SENSATION: WFL   POSTURE: rounded shoulders, forward head, increased thoracic kyphosis, and flexed trunk    LOWER EXTREMITY ROM:  WFL    LOWER EXTREMITY MMT:  MMT Right eval Left eval  Hip flexion 3+ 3+  Hip extension    Hip abduction 4- 4-  Hip adduction 4- 4-  Hip internal rotation    Hip external rotation    Knee flexion 3+ 3+  Knee extension 3+ 3+  Ankle dorsiflexion    Ankle plantarflexion    Ankle inversion    Ankle eversion     (Blank rows = not tested)   FUNCTIONAL TESTS:  5 times sit to stand: unable to complete Timed up and go (TUG): 45.53s  GAIT: Distance walked: in clinic distances Assistive device utilized: Single point cane Level of assistance: Modified independence Comments: slowed gait, unsteady, decreased step length, decreased arm swing    TODAY'S TREATMENT: Eval and POC   PATIENT EDUCATION:  Education details: POC Person educated: Patient Education method: Explanation Education comprehension: verbalized understanding   HOME EXERCISE PROGRAM: TBD  ASSESSMENT:  CLINICAL IMPRESSION: Patient is a 76 y.o. female who was seen today for physical therapy evaluation and treatment for L knee pain. She had a R TKA in 2021 an has been having pain with her L knee now. She does not want to get another surgery. Patient has a fear of falling, ambulates with a SPC and is very unsteady and unsafe. She deviates off track when walking. Patient wants to be able to do aquatic therapy and was told if she came here at [facility] we could make a referral to get her in faster. Patient has decreased endurance and mobility and will benefit from skilled PT intervention to address impairments.   OBJECTIVE IMPAIRMENTS Abnormal gait, decreased  balance, decreased mobility, difficulty walking, decreased ROM, decreased strength, decreased safety awareness, impaired UE functional use, improper body mechanics, obesity, and pain.   ACTIVITY LIMITATIONS carrying, lifting, bending, sitting, standing, squatting, stairs, transfers, and locomotion level  PARTICIPATION LIMITATIONS: meal prep, cleaning, laundry, shopping, and community activity  PERSONAL FACTORS Age, Behavior pattern, Time since onset of injury/illness/exacerbation, and 1-2 comorbidities: obesity, Chiari malformation, heart murmur   are also affecting patient's functional outcome.   REHAB POTENTIAL: Fair    CLINICAL DECISION MAKING: Evolving/moderate complexity  EVALUATION COMPLEXITY: Low   GOALS: Goals reviewed with patient? Yes  SHORT TERM GOALS: Target date: 11/03/21  Patient will be independent with initial HEP. Goal status: INITIAL   LONG TERM GOALS: Target date: 12/08/21  Patient will be independent with advanced/ongoing HEP to improve outcomes and carryover.  Goal status: INITIAL  2.  Patient will report at least 75% improvement in L knee pain to improve QOL. Baseline: 7/10 Goal status: INITIAL  3.  Patient will demonstrate improved functional LE strength as demonstrated by 4/5 in BLE. Baseline: 3+/5 Goal status: INITIAL  5.  Patient will be able to ambulate 300' with LRAD and normal gait pattern without increased pain to access community.  Baseline: walking w/SPC Goal status: INITIAL  6. Patient will be able to ascend/descend stairs with 1 HR and reciprocal step pattern safely to access home and community.  Goal status: INITIAL  7.  Patient will report 20 on FOTO (patient reported outcome measure) to demonstrate improved functional ability. Baseline: 38 Goal status: INITIAL   PLAN: PT FREQUENCY: 1-2x/week  PT DURATION: 10 weeks  PLANNED INTERVENTIONS: Therapeutic exercises, Therapeutic activity, Neuromuscular re-education, Balance training,  Gait training, Patient/Family education, Self Care, Joint mobilization, Stair training, Aquatic Therapy, Electrical stimulation, Cryotherapy, Moist heat, Taping, Vasopneumatic device, Traction, Ionotophoresis '4mg'$ /ml Dexamethasone, and Manual therapy  PLAN FOR NEXT SESSION: low level LE strengthening, gait training, Nustep, balance    Andris Baumann, PT 09/29/2021, 9:20 AM

## 2021-10-04 ENCOUNTER — Ambulatory Visit: Payer: Medicare Other | Admitting: Physical Therapy

## 2021-10-04 ENCOUNTER — Encounter: Payer: Self-pay | Admitting: Physical Therapy

## 2021-10-04 DIAGNOSIS — M25562 Pain in left knee: Secondary | ICD-10-CM | POA: Diagnosis not present

## 2021-10-04 DIAGNOSIS — R279 Unspecified lack of coordination: Secondary | ICD-10-CM | POA: Diagnosis not present

## 2021-10-04 DIAGNOSIS — R2689 Other abnormalities of gait and mobility: Secondary | ICD-10-CM

## 2021-10-04 DIAGNOSIS — G8929 Other chronic pain: Secondary | ICD-10-CM | POA: Diagnosis not present

## 2021-10-04 DIAGNOSIS — M6281 Muscle weakness (generalized): Secondary | ICD-10-CM | POA: Diagnosis not present

## 2021-10-04 NOTE — Therapy (Signed)
OUTPATIENT PHYSICAL THERAPY LOWER EXTREMITY Treatment   Patient Name: Bridget Cortez MRN: [id number] DOB:11-30-1945, 76 y.o., female Today's Date: 10/04/2021   PT End of Session - 10/04/21 1546     Visit Number 2    Date for PT Re-Evaluation 12/22/21    Authorization Type Medicare    PT Start Time 1533    PT Stop Time 1614    PT Time Calculation (min) 41 min    Activity Tolerance Patient tolerated treatment well    Behavior During Therapy [facility] for tasks assessed/performed             Past Medical History:  Diagnosis Date   Abnormal Pap smear 2006   vaginal medicine according to patient   Allergy    Arthritis    Asthma    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 eye    cataract removed.   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 eye aneurysm surgery Left 2017   MOUTH SURGERY  2019   rt. side gum surgery  Lt. done 3 years ago   RADIOACTIVE SEED  GUIDED EXCISIONAL BREAST BIOPSY Right 07/09/2018   Procedure: RADIOACTIVE SEED GUIDED EXCISIONAL RIGHT  BREAST BIOPSY;  Surgeon: Stark Klein, MD;  Location: [place];  Service: General;  Laterality: Right;   TOTAL KNEE ARTHROPLASTY Right 09/09/2019   Procedure: RIGHT TOTAL KNEE ARTHROPLASTY;  Surgeon: Melrose Nakayama, MD;  Location: [facility];  Service: Orthopedics;  Laterality: Right;   Patient Active Problem List   Diagnosis Date Noted   Leukocytopenia 09/15/2020   Deficiency anemia 09/15/2020   Persistent atrial fibrillation (HCC)    Primary osteoarthritis of right knee 09/09/2019   (HFpEF) heart failure with preserved ejection fraction () 04/13/2019   SVT (supraventricular tachycardia) ([place]) 08/04/2017   Wide-complex tachycardia 08/03/2017   Asthma 08/06/2014   Anxiety about health 08/06/2014   Essential hypertension 08/06/2014   Personal history of colonic adenomas 03/20/2012   Chiari malformation    Heart murmur    H/O varicella    Yeast infection    Cancer ([place])    H/O osteopenia    Abnormal Pap smear    Vulvar intraepithelial neoplasia III (VIN III) 04/12/2009   Atrophy  of vagina 06/23/2005    PCP: [name]  REFERRING PROVIDER: Melrose Nakayama  REFERRING DIAG: L knee pain sp R TKA in 2021  THERAPY DIAG:  Chronic pain of left knee  Other abnormalities of gait and mobility  Muscle weakness (generalized)  Unspecified lack of coordination  Rationale for Evaluation and Treatment Rehabilitation  ONSET DATE: 09/13/21  SUBJECTIVE:   SUBJECTIVE STATEMENT: Patient reports that she is really struggling with walking, I fatigue with any walking, really struggle PERTINENT HISTORY: R TKA, heart failure, edema in ankles, HTN, Chiari malformation   PAIN:  Are you having pain? Yes: NPRS scale: 7/10 Pain location: L knee Pain description: dull, achy  Aggravating factors: standing and walking for too long  Relieving factors: rubbing it with alcohol, Voltaren    PRECAUTIONS: Fall  WEIGHT BEARING RESTRICTIONS No  FALLS:  Has patient fallen in last 6 months? Yes. Number of falls 1  LIVING ENVIRONMENT: Lives with: lives with their spouse Lives in: House/apartment Stairs: Yes: External: 3 steps; on right going up Has following equipment at home: Single point cane and Walker - 2 wheeled  OCCUPATION: retired  PLOF: Independent  PATIENT GOALS to be able to stand and fix my posture    OBJECTIVE:   PATIENT SURVEYS:  FOTO 38/100  COGNITION:  Overall cognitive status: Within functional limits for tasks assessed     SENSATION: WFL   POSTURE: rounded shoulders, forward head, increased thoracic kyphosis, and flexed trunk    LOWER EXTREMITY ROM:  WFL    LOWER EXTREMITY MMT:  MMT Right eval Left eval  Hip flexion 3+ 3+  Hip extension    Hip abduction 4- 4-  Hip adduction 4- 4-  Hip internal rotation    Hip external rotation    Knee flexion 3+ 3+  Knee extension 3+ 3+  Ankle dorsiflexion    Ankle plantarflexion    Ankle inversion    Ankle eversion     (Blank rows = not tested)   FUNCTIONAL TESTS:  5 times sit to stand: unable to complete Timed up and go (TUG): 45.53s  GAIT: Distance walked: in clinic distances Assistive device utilized: Single point cane Level of assistance: Modified independence Comments: slowed gait, unsteady, decreased step length, decreased arm swing    TODAY'S TREATMENT: 10/04/21 Nustep level 3 x 6 minutes LE's only Red tband HS curls LAQ's 2x10 Ball b/n knees squeeze Red tband clamshells in sitting Seated toe rasises and heel raises Education on the pool program    PATIENT EDUCATION:  Education details: POC Person educated: Patient Education method: Explanation Education comprehension: verbalized understanding   HOME EXERCISE PROGRAM: TBD  ASSESSMENT:  CLINICAL IMPRESSION: I spoke wth the patient about the pool, explained the distance that she would have to walk and that  she would need to be on time, she wants to try this however reports taht she needs to get a bathing suit.  She does not think she can go there every visit due to the distance from her home.  We agreed to 1x/week and for her to attend 2 pool sessions and then 1 back on land with [place].  I am working with the pool therapist and the patient will need to bring in her calendar to schedule this.  OBJECTIVE IMPAIRMENTS Abnormal gait, decreased balance, decreased mobility, difficulty walking, decreased ROM, decreased strength, decreased safety awareness, impaired UE functional use, improper body mechanics, obesity, and pain.   ACTIVITY LIMITATIONS carrying, lifting, bending, sitting, standing, squatting, stairs, transfers, and  locomotion level  PARTICIPATION LIMITATIONS: meal prep, cleaning, laundry, shopping, and community activity  PERSONAL FACTORS Age, Behavior pattern, Time since onset of injury/illness/exacerbation, and 1-2 comorbidities: obesity, Chiari malformation, heart murmur   are also affecting patient's functional outcome.   REHAB POTENTIAL: Fair    CLINICAL DECISION MAKING: Evolving/moderate complexity  EVALUATION COMPLEXITY: Low   GOALS: Goals reviewed with patient? Yes  SHORT TERM GOALS: Target date: 11/03/21  Patient will be independent with initial HEP. Goal status: INITIAL   LONG TERM GOALS: Target date: 12/08/21  Patient will be independent with advanced/ongoing HEP to improve outcomes and carryover.  Goal status: INITIAL  2.  Patient will report at least 75% improvement in L knee pain to improve QOL. Baseline: 7/10 Goal status: INITIAL  3.  Patient will demonstrate improved functional LE strength as demonstrated by 4/5 in BLE. Baseline: 3+/5 Goal status: INITIAL  5.  Patient will be able to ambulate 300' with LRAD and normal gait pattern without increased pain to access community.  Baseline: walking w/SPC Goal status: INITIAL  6. Patient will be able to  ascend/descend stairs with 1 HR and reciprocal step pattern safely to access home and community.  Goal status: INITIAL  7.  Patient will report 23 on FOTO (patient reported outcome measure) to demonstrate improved functional ability. Baseline: 38 Goal status: INITIAL   PLAN: PT FREQUENCY: 1-2x/week  PT DURATION: 10 weeks  PLANNED INTERVENTIONS: Therapeutic exercises, Therapeutic activity, Neuromuscular re-education, Balance training, Gait training, Patient/Family education, Self Care, Joint mobilization, Stair training, Electrical stimulation, Cryotherapy, Moist heat, Taping, Vasopneumatic device, Traction, Ionotophoresis '4mg'$ /ml Dexamethasone, and Manual therapy  PLAN FOR NEXT SESSION: low level LE strengthening, gait training, Nustep, balance , continue to follow up with scheduling the pool   Sumner Boast, PT 10/04/2021, 3:47 PM

## 2021-10-10 ENCOUNTER — Ambulatory Visit: Payer: Medicare Other | Admitting: Physical Therapy

## 2021-10-10 ENCOUNTER — Encounter: Payer: Self-pay | Admitting: Physical Therapy

## 2021-10-10 DIAGNOSIS — R279 Unspecified lack of coordination: Secondary | ICD-10-CM | POA: Diagnosis not present

## 2021-10-10 DIAGNOSIS — M6281 Muscle weakness (generalized): Secondary | ICD-10-CM

## 2021-10-10 DIAGNOSIS — R2689 Other abnormalities of gait and mobility: Secondary | ICD-10-CM

## 2021-10-10 DIAGNOSIS — G8929 Other chronic pain: Secondary | ICD-10-CM

## 2021-10-10 DIAGNOSIS — M25562 Pain in left knee: Secondary | ICD-10-CM | POA: Diagnosis not present

## 2021-10-10 NOTE — Therapy (Signed)
OUTPATIENT PHYSICAL THERAPY LOWER EXTREMITY Treatment   Patient Name: Bridget Cortez MRN: [id number] DOB:[birth year], 76 y.o., female Today's Date: 10/10/2021   PT End of Session - 10/10/21 1152     Visit Number 3    Date for PT Re-Evaluation 12/22/21    PT Start Time 1152    PT Stop Time 1230    PT Time Calculation (min) 38 min    Activity Tolerance Patient tolerated treatment well    Behavior During Therapy [facility] for tasks assessed/performed             Past Medical History:  Diagnosis Date   Abnormal Pap smear 2006   vaginal medicine according to patient   Allergy    Arthritis    Asthma    Atrophy of vagina 06/2005   Breast cancer ([place]) 1999   Left   CAP (community acquired pneumonia) 08/06/2014   Cataract    cataracts lt. eye    cataract removed.   Chiari malformation    Diverticulosis    Dysrhythmia    SVT   GERD (gastroesophageal reflux disease)    H/O osteopenia    08/2006   H/O varicella    Heart murmur    Hypertension    Monilial vaginitis 07/2007   Multiple allergies    Ovarian cyst    Personal history of colonic adenomas 03/13/2012   Personal history of radiation therapy 1990   Pneumonia 07/2014   VIN III (vulvar intraepithelial neoplasia III) 03/2009   Yeast infection    Past Surgical History:  Procedure Laterality Date   ABDOMINAL HYSTERECTOMY     ABDOMINAL SURGERY     BREAST EXCISIONAL BIOPSY Right    benign   BREAST LUMPECTOMY Left 1990   BREAST SURGERY     Lumpectomy, left breast   CARDIOVERSION N/A 07/28/2020   Procedure: CARDIOVERSION;  Surgeon: Donato Heinz, MD;  Location: [place];  Service: Cardiovascular;  Laterality: N/A;   CATARACT EXTRACTION Left 2018   COLONOSCOPY     COLONOSCOPY W/ BIOPSIES     EYE SURGERY     HERNIA REPAIR     KNEE SURGERY     right   lt. eye aneurysm surgery Left 2017   MOUTH SURGERY  2019   rt. side gum surgery  Lt. done 3 years ago   RADIOACTIVE SEED GUIDED EXCISIONAL BREAST BIOPSY  Right 07/09/2018   Procedure: RADIOACTIVE SEED GUIDED EXCISIONAL RIGHT  BREAST BIOPSY;  Surgeon: Stark Klein, MD;  Location: [place];  Service: General;  Laterality: Right;   TOTAL KNEE ARTHROPLASTY Right 09/09/2019   Procedure: RIGHT TOTAL KNEE ARTHROPLASTY;  Surgeon: Melrose Nakayama, MD;  Location: [facility];  Service: Orthopedics;  Laterality: Right;   Patient Active Problem List   Diagnosis Date Noted   Leukocytopenia 09/15/2020   Deficiency anemia 09/15/2020   Persistent atrial fibrillation (HCC)    Primary osteoarthritis of right knee 09/09/2019   (HFpEF) heart failure with preserved ejection fraction ([place]) 04/13/2019   SVT (supraventricular tachycardia) ([place]) 08/04/2017   Wide-complex tachycardia 08/03/2017   Asthma 08/06/2014   Anxiety about health 08/06/2014   Essential hypertension 08/06/2014   Personal history of colonic adenomas 03/20/2012   Chiari malformation    Heart murmur    H/O varicella    Yeast infection    Cancer ([place])    H/O osteopenia    Abnormal Pap smear    Vulvar intraepithelial neoplasia III (VIN III) 04/12/2009   Atrophy of vagina 06/23/2005  PCP: [name]  REFERRING PROVIDER: Melrose Nakayama  REFERRING DIAG: L knee pain sp R TKA in 2021  THERAPY DIAG:  Chronic pain of left knee  Other abnormalities of gait and mobility  Muscle weakness (generalized)  Rationale for Evaluation and Treatment Rehabilitation  ONSET DATE: 09/13/21  SUBJECTIVE:   SUBJECTIVE STATEMENT: "Usually everyday I fell good, unless I start walking and I feel a soreness"  PERTINENT HISTORY: R TKA, heart failure, edema in ankles, HTN, Chiari malformation   PAIN:  Are you having pain? Yes: NPRS scale: 7/10 Pain location: L knee Pain description: dull, achy  Aggravating factors: standing and walking for too long  Relieving factors: rubbing it with alcohol, Voltaren   PRECAUTIONS: Fall  WEIGHT BEARING RESTRICTIONS No  FALLS:  Has patient fallen in last 6  months? Yes. Number of falls 1  LIVING ENVIRONMENT: Lives with: lives with their spouse Lives in: House/apartment Stairs: Yes: External: 3 steps; on right going up Has following equipment at home: Single point cane and Walker - 2 wheeled  OCCUPATION: retired  PLOF: Independent  PATIENT GOALS to be able to stand and fix my posture    OBJECTIVE:   PATIENT SURVEYS:  FOTO 38/100  COGNITION:  Overall cognitive status: Within functional limits for tasks assessed     SENSATION: WFL   POSTURE: rounded shoulders, forward head, increased thoracic kyphosis, and flexed trunk    LOWER EXTREMITY ROM:  WFL    LOWER EXTREMITY MMT:  MMT Right eval Left eval  Hip flexion 3+ 3+  Hip extension    Hip abduction 4- 4-  Hip adduction 4- 4-  Hip internal rotation    Hip external rotation    Knee flexion 3+ 3+  Knee extension 3+ 3+  Ankle dorsiflexion    Ankle plantarflexion    Ankle inversion    Ankle eversion     (Blank rows = not tested)   FUNCTIONAL TESTS:  5 times sit to stand: unable to complete Timed up and go (TUG): 45.53s  GAIT: Distance walked: in clinic distances Assistive device utilized: Single point cane Level of assistance: Modified independence Comments: slowed gait, unsteady, decreased step length, decreased arm swing    TODAY'S TREATMENT: 09/1821 NuStep L2 x 6 min LE only Red tband HS curls 2x10 LAQ 2lb 2x10  Seated March 2lb 2x10 Ball b/n knees squeeze Seated toe rasises and heel raises   10/04/21 Nustep level 3 x 6 minutes LE's only Red tband HS curls LAQ's 2x10 Ball b/n knees squeeze Red tband clamshells in sitting Seated toe rasises and heel raises Education on the pool program    PATIENT EDUCATION:  Education details: POC Person educated: Patient Education method: Explanation Education comprehension: verbalized understanding   HOME EXERCISE PROGRAM: TBD  ASSESSMENT:  CLINICAL IMPRESSION: Pt enters ~ 7 min late feeling  well. Continues with LE strengthening interventions. Cues needed a times to redirect pt to the interventions due to being hyper verbal. Cue to complete full ROM and to hold contraction with curls and LAQ.  OBJECTIVE IMPAIRMENTS Abnormal gait, decreased balance, decreased mobility, difficulty walking, decreased ROM, decreased strength, decreased safety awareness, impaired UE functional use, improper body mechanics, obesity, and pain.   ACTIVITY LIMITATIONS carrying, lifting, bending, sitting, standing, squatting, stairs, transfers, and locomotion level  PARTICIPATION LIMITATIONS: meal prep, cleaning, laundry, shopping, and community activity  PERSONAL FACTORS Age, Behavior pattern, Time since onset of injury/illness/exacerbation, and 1-2 comorbidities: obesity, Chiari malformation, heart murmur   are also affecting patient's functional  outcome.   REHAB POTENTIAL: Fair    CLINICAL DECISION MAKING: Evolving/moderate complexity  EVALUATION COMPLEXITY: Low   GOALS: Goals reviewed with patient? Yes  SHORT TERM GOALS: Target date: 11/03/21  Patient will be independent with initial HEP. Goal status: INITIAL   LONG TERM GOALS: Target date: 12/08/21  Patient will be independent with advanced/ongoing HEP to improve outcomes and carryover.  Goal status: INITIAL  2.  Patient will report at least 75% improvement in L knee pain to improve QOL. Baseline: 7/10 Goal status: INITIAL  3.  Patient will demonstrate improved functional LE strength as demonstrated by 4/5 in BLE. Baseline: 3+/5 Goal status: INITIAL  5.  Patient will be able to ambulate 300' with LRAD and normal gait pattern without increased pain to access community.  Baseline: walking w/SPC Goal status: INITIAL  6. Patient will be able to ascend/descend stairs with 1 HR and reciprocal step pattern safely to access home and community.  Goal status: INITIAL  7.  Patient will report 19 on FOTO (patient reported outcome measure) to  demonstrate improved functional ability. Baseline: 38 Goal status: INITIAL   PLAN: PT FREQUENCY: 1-2x/week  PT DURATION: 10 weeks  PLANNED INTERVENTIONS: Therapeutic exercises, Therapeutic activity, Neuromuscular re-education, Balance training, Gait training, Patient/Family education, Self Care, Joint mobilization, Stair training, Electrical stimulation, Cryotherapy, Moist heat, Taping, Vasopneumatic device, Traction, Ionotophoresis '4mg'$ /ml Dexamethasone, and Manual therapy  PLAN FOR NEXT SESSION: low level LE strengthening, gait training, Nustep, balance , continue to follow up with scheduling the pool   Scot Jun, PTA 10/10/2021, 11:52 AM

## 2021-10-11 DIAGNOSIS — L820 Inflamed seborrheic keratosis: Secondary | ICD-10-CM | POA: Diagnosis not present

## 2021-10-11 DIAGNOSIS — D485 Neoplasm of uncertain behavior of skin: Secondary | ICD-10-CM | POA: Diagnosis not present

## 2021-10-12 DIAGNOSIS — G935 Compression of brain: Secondary | ICD-10-CM | POA: Diagnosis not present

## 2021-10-14 ENCOUNTER — Encounter: Payer: Self-pay | Admitting: Physical Therapy

## 2021-10-14 ENCOUNTER — Ambulatory Visit: Payer: Medicare Other | Admitting: Physical Therapy

## 2021-10-14 DIAGNOSIS — M25562 Pain in left knee: Secondary | ICD-10-CM | POA: Diagnosis not present

## 2021-10-14 DIAGNOSIS — G8929 Other chronic pain: Secondary | ICD-10-CM

## 2021-10-14 DIAGNOSIS — R279 Unspecified lack of coordination: Secondary | ICD-10-CM

## 2021-10-14 DIAGNOSIS — R2689 Other abnormalities of gait and mobility: Secondary | ICD-10-CM | POA: Diagnosis not present

## 2021-10-14 DIAGNOSIS — M6281 Muscle weakness (generalized): Secondary | ICD-10-CM | POA: Diagnosis not present

## 2021-10-14 NOTE — Therapy (Signed)
OUTPATIENT PHYSICAL THERAPY LOWER EXTREMITY Treatment   Patient Name: Bridget Cortez MRN: [id number] DOB:Aug 28, 1945, 76 y.o., female Today's Date: 10/14/2021   PT End of Session - 10/14/21 1112     Visit Number 4    Date for PT Re-Evaluation 12/22/21    PT Start Time 1105    PT Stop Time 1143    PT Time Calculation (min) 38 min    Activity Tolerance Patient tolerated treatment well    Behavior During Therapy [facility] for tasks assessed/performed              Past Medical History:  Diagnosis Date   Abnormal Pap smear 2006   vaginal medicine according to patient   Allergy    Arthritis    Asthma    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 eye    cataract removed.   Chiari malformation    Diverticulosis    Dysrhythmia    SVT   GERD (gastroesophageal reflux disease)    H/O osteopenia    08/2006   H/O varicella    Heart murmur    Hypertension    Monilial vaginitis 07/2007   Multiple allergies    Ovarian cyst    Personal history of colonic adenomas 03/13/2012   Personal history of radiation therapy 1990   Pneumonia 07/2014   VIN III (vulvar intraepithelial neoplasia III) 03/2009   Yeast infection    Past Surgical History:  Procedure Laterality Date   ABDOMINAL HYSTERECTOMY     ABDOMINAL SURGERY     BREAST EXCISIONAL BIOPSY Right    benign   BREAST LUMPECTOMY Left 1990   BREAST SURGERY     Lumpectomy, left breast   CARDIOVERSION N/A 07/28/2020   Procedure: CARDIOVERSION;  Surgeon: Donato Heinz, MD;  Location: [place];  Service: Cardiovascular;  Laterality: N/A;   CATARACT EXTRACTION Left 2018   COLONOSCOPY     COLONOSCOPY W/ BIOPSIES     EYE SURGERY     HERNIA REPAIR     KNEE SURGERY     right   lt. eye aneurysm surgery Left 2017   MOUTH SURGERY  2019   rt. side gum surgery  Lt. done 3 years ago   RADIOACTIVE SEED GUIDED EXCISIONAL BREAST  BIOPSY Right 07/09/2018   Procedure: RADIOACTIVE SEED GUIDED EXCISIONAL RIGHT  BREAST BIOPSY;  Surgeon: Stark Klein, MD;  Location: [place];  Service: General;  Laterality: Right;   TOTAL KNEE ARTHROPLASTY Right 09/09/2019   Procedure: RIGHT TOTAL KNEE ARTHROPLASTY;  Surgeon: Melrose Nakayama, MD;  [facility];  Service: Orthopedics;  Laterality: Right;   Patient Active Problem List   Diagnosis Date Noted   Leukocytopenia 09/15/2020   Deficiency anemia 09/15/2020   Persistent atrial fibrillation (HCC)    Primary osteoarthritis of right knee 09/09/2019   (HFpEF) heart failure with preserved ejection fraction ([place]) 04/13/2019   SVT (supraventricular tachycardia) ([place]) 08/04/2017   Wide-complex tachycardia 08/03/2017   Asthma 08/06/2014   Anxiety about health 08/06/2014   Essential hypertension 08/06/2014   Personal history of colonic adenomas 03/20/2012   Chiari malformation    Heart murmur    H/O varicella    Yeast infection    Cancer ([place])    H/O osteopenia    Abnormal Pap smear    Vulvar intraepithelial neoplasia III (VIN III) 04/12/2009   Atrophy of vagina 06/23/2005  PCP: [name]  REFERRING PROVIDER: Melrose Nakayama  REFERRING DIAG: L knee pain sp R TKA in 2021  THERAPY DIAG:  Chronic pain of left knee  Other abnormalities of gait and mobility  Muscle weakness (generalized)  Unspecified lack of coordination  Rationale for Evaluation and Treatment Rehabilitation  ONSET DATE: 09/13/21  SUBJECTIVE:   SUBJECTIVE STATEMENT: Patient reports that her L knee was sore after her last treatment.  PERTINENT HISTORY: R TKA, heart failure, edema in ankles, HTN, Chiari malformation   PAIN:  Are you having pain? Yes: NPRS scale: 7/10 Pain location: L knee Pain description: dull, achy  Aggravating factors: standing and walking for too long  Relieving factors: rubbing it with alcohol, Voltaren   PRECAUTIONS: Fall  WEIGHT BEARING RESTRICTIONS No  FALLS:   Has patient fallen in last 6 months? Yes. Number of falls 1  LIVING ENVIRONMENT: Lives with: lives with their spouse Lives in: House/apartment Stairs: Yes: External: 3 steps; on right going up Has following equipment at home: Single point cane and Walker - 2 wheeled  OCCUPATION: retired  PLOF: Independent  PATIENT GOALS to be able to stand and fix my posture    OBJECTIVE:   PATIENT SURVEYS:  FOTO 38/100  COGNITION:  Overall cognitive status: Within functional limits for tasks assessed     SENSATION: WFL   POSTURE: rounded shoulders, forward head, increased thoracic kyphosis, and flexed trunk    LOWER EXTREMITY ROM:  WFL    LOWER EXTREMITY MMT:  MMT Right eval Left eval  Hip flexion 3+ 3+  Hip extension    Hip abduction 4- 4-  Hip adduction 4- 4-  Hip internal rotation    Hip external rotation    Knee flexion 3+ 3+  Knee extension 3+ 3+  Ankle dorsiflexion    Ankle plantarflexion    Ankle inversion    Ankle eversion     (Blank rows = not tested)   FUNCTIONAL TESTS:  5 times sit to stand: unable to complete Timed up and go (TUG): 45.53s  GAIT: Distance walked: in clinic distances Assistive device utilized: Single point cane Level of assistance: Modified independence Comments: slowed gait, unsteady, decreased step length, decreased arm swing    TODAY'S TREATMENT: 10/14/21 NuStep L3 x 6 minutes Unable to tolerate Supine Seated long kicks, no weight due to sore knee after last treatment 10 reps each Seated lateral trunk flexion, touching elbow to  mat on each side 10 reps each Lat lean to R lift LLE up to the side to the top of mat x 5, repeat with RLE Sit to stand from elevated mat with BUE on the back of chair turned backwards, 5 reps Practiced sit to stand from regular chair with armrests, emphasizing forward lean-6  reps Standing chest press and sB shoulder flex holding cane in BUE 5 reps each-reported L knee felt slightly  unstable.   09/1821 NuStep L2 x 6 min LE only Red tband HS curls 2x10 LAQ 2lb 2x10  Seated March 2lb 2x10 Ball b/n knees squeeze Seated toe rasises and heel raises   10/04/21 Nustep level 3 x 6 minutes LE's only Red tband HS curls LAQ's 2x10 Ball b/n knees squeeze Red tband clamshells in sitting Seated toe rasises and heel raises Education on the pool program    PATIENT EDUCATION:  Education details: POC Person educated: Patient Education method: Explanation Education comprehension: verbalized understanding   HOME EXERCISE PROGRAM: TBD  ASSESSMENT:  CLINICAL IMPRESSION: Patient reports that her L knee  was sore after her last treatment. Treatment focused on strengthening of BLE as well as trunk to improve stability and balance. Educated her to forward weight shifts to facilitate sit to stand with much improved performance.  OBJECTIVE IMPAIRMENTS Abnormal gait, decreased balance, decreased mobility, difficulty walking, decreased ROM, decreased strength, decreased safety awareness, impaired UE functional use, improper body mechanics, obesity, and pain.   ACTIVITY LIMITATIONS carrying, lifting, bending, sitting, standing, squatting, stairs, transfers, and locomotion level  PARTICIPATION LIMITATIONS: meal prep, cleaning, laundry, shopping, and community activity  PERSONAL FACTORS Age, Behavior pattern, Time since onset of injury/illness/exacerbation, and 1-2 comorbidities: obesity, Chiari malformation, heart murmur   are also affecting patient's functional outcome.   REHAB POTENTIAL: Fair    CLINICAL DECISION MAKING: Evolving/moderate complexity  EVALUATION COMPLEXITY: Low   GOALS: Goals reviewed with patient? Yes  SHORT TERM GOALS: Target date: 11/03/21  Patient will be independent with initial HEP. Goal status: INITIAL   LONG TERM GOALS: Target date: 12/08/21  Patient will be independent with advanced/ongoing HEP to improve outcomes and carryover.  Goal  status: INITIAL  2.  Patient will report at least 75% improvement in L knee pain to improve QOL. Baseline: 7/10 Goal status: ongoing  3.  Patient will demonstrate improved functional LE strength as demonstrated by 4/5 in BLE. Baseline: 3+/5 Goal status: ongoing  5.  Patient will be able to ambulate 300' with LRAD and normal gait pattern without increased pain to access community.  Baseline: walking w/SPC Goal status: INITIAL  6. Patient will be able to ascend/descend stairs with 1 HR and reciprocal step pattern safely to access home and community.  Goal status: INITIAL  7.  Patient will report 59 on FOTO (patient reported outcome measure) to demonstrate improved functional ability. Baseline: 38 Goal status: INITIAL   PLAN: PT FREQUENCY: 1-2x/week  PT DURATION: 10 weeks  PLANNED INTERVENTIONS: Therapeutic exercises, Therapeutic activity, Neuromuscular re-education, Balance training, Gait training, Patient/Family education, Self Care, Joint mobilization, Stair training, Electrical stimulation, Cryotherapy, Moist heat, Taping, Vasopneumatic device, Traction, Ionotophoresis '4mg'$ /ml Dexamethasone, and Manual therapy  PLAN FOR NEXT SESSION: low level LE strengthening, gait training, Nustep, balance ,   Marcelina Morel, DPT 10/14/2021, 11:41 AM

## 2021-10-18 ENCOUNTER — Ambulatory Visit: Payer: Medicare Other | Admitting: Physical Therapy

## 2021-10-20 ENCOUNTER — Encounter: Payer: Self-pay | Admitting: Physical Therapy

## 2021-10-20 ENCOUNTER — Ambulatory Visit: Payer: Medicare Other | Admitting: Physical Therapy

## 2021-10-20 DIAGNOSIS — G8929 Other chronic pain: Secondary | ICD-10-CM | POA: Diagnosis not present

## 2021-10-20 DIAGNOSIS — M25562 Pain in left knee: Secondary | ICD-10-CM | POA: Diagnosis not present

## 2021-10-20 DIAGNOSIS — R279 Unspecified lack of coordination: Secondary | ICD-10-CM | POA: Diagnosis not present

## 2021-10-20 DIAGNOSIS — R2689 Other abnormalities of gait and mobility: Secondary | ICD-10-CM

## 2021-10-20 DIAGNOSIS — M6281 Muscle weakness (generalized): Secondary | ICD-10-CM

## 2021-10-20 NOTE — Therapy (Signed)
OUTPATIENT PHYSICAL THERAPY LOWER EXTREMITY Treatment   Patient Name: Bridget Cortez MRN: [id number] DOB:17-Sep-1945, 76 y.o., female Today's Date: 10/20/2021   PT End of Session - 10/20/21 1152     Visit Number 5    Date for PT Re-Evaluation 12/22/21    PT Start Time 1145    PT Stop Time 1230    PT Time Calculation (min) 45 min    Activity Tolerance Patient tolerated treatment well    Behavior During Therapy [facility] for tasks assessed/performed              Past Medical History:  Diagnosis Date   Abnormal Pap smear 2006   vaginal medicine according to patient   Allergy    Arthritis    Asthma    Atrophy of vagina 06/2005   Breast cancer ([place]) 1999   Left   CAP (community acquired pneumonia) 08/06/2014   Cataract    cataracts lt. eye    cataract removed.   Chiari malformation    Diverticulosis    Dysrhythmia    SVT   GERD (gastroesophageal reflux disease)    H/O osteopenia    08/2006   H/O varicella    Heart murmur    Hypertension    Monilial vaginitis 07/2007   Multiple allergies    Ovarian cyst    Personal history of colonic adenomas 03/13/2012   Personal history of radiation therapy 1990   Pneumonia 07/2014   VIN III (vulvar intraepithelial neoplasia III) 03/2009   Yeast infection    Past Surgical History:  Procedure Laterality Date   ABDOMINAL HYSTERECTOMY     ABDOMINAL SURGERY     BREAST EXCISIONAL BIOPSY Right    benign   BREAST LUMPECTOMY Left 1990   BREAST SURGERY     Lumpectomy, left breast   CARDIOVERSION N/A 07/28/2020   Procedure: CARDIOVERSION;  Surgeon: Donato Heinz, MD;  Location: [place];  Service: Cardiovascular;  Laterality: N/A;   CATARACT EXTRACTION Left 2018   COLONOSCOPY     COLONOSCOPY W/ BIOPSIES     EYE SURGERY     HERNIA REPAIR     KNEE SURGERY     right   lt. eye aneurysm surgery Left 2017   MOUTH SURGERY  2019   rt. side gum surgery  Lt. done 3 years ago   RADIOACTIVE SEED GUIDED EXCISIONAL BREAST  BIOPSY Right 07/09/2018   Procedure: RADIOACTIVE SEED GUIDED EXCISIONAL RIGHT  BREAST BIOPSY;  Surgeon: Stark Klein, MD;  Location: [place];  Service: General;  Laterality: Right;   TOTAL KNEE ARTHROPLASTY Right 09/09/2019   Procedure: RIGHT TOTAL KNEE ARTHROPLASTY;  Surgeon: Melrose Nakayama, MD;  Location: [facility];  Service: Orthopedics;  Laterality: Right;   Patient Active Problem List   Diagnosis Date Noted   Leukocytopenia 09/15/2020   Deficiency anemia 09/15/2020   Persistent atrial fibrillation (HCC)    Primary osteoarthritis of right knee 09/09/2019   (HFpEF) heart failure with preserved ejection fraction ([place]) 04/13/2019   SVT (supraventricular tachycardia) ([place]) 08/04/2017   Wide-complex tachycardia 08/03/2017   Asthma 08/06/2014   Anxiety about health 08/06/2014   Essential hypertension 08/06/2014   Personal history of colonic adenomas 03/20/2012   Chiari malformation    Heart murmur    H/O varicella    Yeast infection    Cancer ([place])    H/O osteopenia    Abnormal Pap smear    Vulvar intraepithelial neoplasia III (VIN III) 04/12/2009   Atrophy of vagina 06/23/2005  PCP: [name]  REFERRING PROVIDER: Melrose Nakayama  REFERRING DIAG: L knee pain sp R TKA in 2021  THERAPY DIAG:  Chronic pain of left knee  Other abnormalities of gait and mobility  Muscle weakness (generalized)  Rationale for Evaluation and Treatment Rehabilitation  ONSET DATE: 09/13/21  SUBJECTIVE:   SUBJECTIVE STATEMENT: "Good, I feel good"  PERTINENT HISTORY: R TKA, heart failure, edema in ankles, HTN, Chiari malformation   PAIN:  Are you having pain? Yes: NPRS scale: 0/10 Pain location: L knee Pain description: dull, achy  Aggravating factors: standing and walking for too long  Relieving factors: rubbing it with alcohol, Voltaren   PRECAUTIONS: Fall  WEIGHT BEARING RESTRICTIONS No  FALLS:  Has patient fallen in last 6 months? Yes. Number of falls 1  LIVING  ENVIRONMENT: Lives with: lives with their spouse Lives in: House/apartment Stairs: Yes: External: 3 steps; on right going up Has following equipment at home: Single point cane and Walker - 2 wheeled  OCCUPATION: retired  PLOF: Independent  PATIENT GOALS to be able to stand and fix my posture    OBJECTIVE:   PATIENT SURVEYS:  FOTO 38/100  COGNITION:  Overall cognitive status: Within functional limits for tasks assessed     SENSATION: WFL   POSTURE: rounded shoulders, forward head, increased thoracic kyphosis, and flexed trunk    LOWER EXTREMITY ROM:  WFL    LOWER EXTREMITY MMT:  MMT Right eval Left eval  Hip flexion 3+ 3+  Hip extension    Hip abduction 4- 4-  Hip adduction 4- 4-  Hip internal rotation    Hip external rotation    Knee flexion 3+ 3+  Knee extension 3+ 3+  Ankle dorsiflexion    Ankle plantarflexion    Ankle inversion    Ankle eversion     (Blank rows = not tested)   FUNCTIONAL TESTS:  5 times sit to stand: unable to complete Timed up and go (TUG): 45.53s  GAIT: Distance walked: in clinic distances Assistive device utilized: Single point cane Level of assistance: Modified independence Comments: slowed gait, unsteady, decreased step length, decreased arm swing    TODAY'S TREATMENT: 10/20/21 NuStep L3 x 6 minutes Seated long kicks, no weight 2x10 reps each Sit to stand from elevated mat 2x5 reps Hamstring curls red x10 Standing chest press and shoulder flex holding cane in BUE 5 reps each-reported L knee felt slightly unstable. Knee add ball squeeze   10/14/21 NuStep L3 x 6 minutes Unable to tolerate Supine Seated long kicks, no weight due to sore knee after last treatment 10 reps each Seated lateral trunk flexion, touching elbow to  mat on each side 10 reps each Lat lean to R lift LLE up to the side to the top of mat x 5, repeat with RLE Sit to stand from elevated mat with BUE on the back of chair turned backwards, 5  reps Practiced sit to stand from regular chair with armrests, emphasizing forward lean-6  reps Standing chest press and sB shoulder flex holding cane in BUE 5 reps each-reported L knee felt slightly unstable.   09/1821 NuStep L2 x 6 min LE only Red tband HS curls 2x10 LAQ 2lb 2x10  Seated March 2lb 2x10 Ball b/n knees squeeze Seated toe rasises and heel raises   10/04/21 Nustep level 3 x 6 minutes LE's only Red tband HS curls LAQ's 2x10 Ball b/n knees squeeze Red tband clamshells in sitting Seated toe rasises and heel raises Education on the pool  program    PATIENT EDUCATION:  Education details: POC Person educated: Patient Education method: Explanation Education comprehension: verbalized understanding   HOME EXERCISE PROGRAM: TBD  ASSESSMENT:  CLINICAL IMPRESSION: Treatment focused on strengthening of BLE as well as trunk to improve stability and balance. Little to no weight used to aid in the prevention of any soreness. Pt stated she did not want to be sore the next day. Educated her to forward weight shifts to facilitate sit to stand with much improved performance.  OBJECTIVE IMPAIRMENTS Abnormal gait, decreased balance, decreased mobility, difficulty walking, decreased ROM, decreased strength, decreased safety awareness, impaired UE functional use, improper body mechanics, obesity, and pain.   ACTIVITY LIMITATIONS carrying, lifting, bending, sitting, standing, squatting, stairs, transfers, and locomotion level  PARTICIPATION LIMITATIONS: meal prep, cleaning, laundry, shopping, and community activity  PERSONAL FACTORS Age, Behavior pattern, Time since onset of injury/illness/exacerbation, and 1-2 comorbidities: obesity, Chiari malformation, heart murmur   are also affecting patient's functional outcome.   REHAB POTENTIAL: Fair    CLINICAL DECISION MAKING: Evolving/moderate complexity  EVALUATION COMPLEXITY: Low   GOALS: Goals reviewed with patient?  Yes  SHORT TERM GOALS: Target date: 11/03/21  Patient will be independent with initial HEP. Goal status: INITIAL   LONG TERM GOALS: Target date: 12/08/21  Patient will be independent with advanced/ongoing HEP to improve outcomes and carryover.  Goal status: INITIAL  2.  Patient will report at least 75% improvement in L knee pain to improve QOL. Baseline: 7/10 Goal status: ongoing  3.  Patient will demonstrate improved functional LE strength as demonstrated by 4/5 in BLE. Baseline: 3+/5 Goal status: ongoing  5.  Patient will be able to ambulate 300' with LRAD and normal gait pattern without increased pain to access community.  Baseline: walking w/SPC Goal status: INITIAL  6. Patient will be able to ascend/descend stairs with 1 HR and reciprocal step pattern safely to access home and community.  Goal status: INITIAL  7.  Patient will report 76 on FOTO (patient reported outcome measure) to demonstrate improved functional ability. Baseline: 38 Goal status: INITIAL   PLAN: PT FREQUENCY: 1-2x/week  PT DURATION: 10 weeks  PLANNED INTERVENTIONS: Therapeutic exercises, Therapeutic activity, Neuromuscular re-education, Balance training, Gait training, Patient/Family education, Self Care, Joint mobilization, Stair training, Electrical stimulation, Cryotherapy, Moist heat, Taping, Vasopneumatic device, Traction, Ionotophoresis '4mg'$ /ml Dexamethasone, and Manual therapy  PLAN FOR NEXT SESSION: low level LE strengthening, gait training, Nustep, balance ,   Marcelina Morel, DPT 10/20/2021, 11:53 AM

## 2021-10-20 NOTE — Addendum Note (Signed)
Addended by: Sumner Boast on: 10/20/2021 08:41 AM   Modules accepted: Orders

## 2021-10-24 ENCOUNTER — Ambulatory Visit: Payer: Medicare Other | Attending: Orthopaedic Surgery | Admitting: Physical Therapy

## 2021-10-24 ENCOUNTER — Encounter: Payer: Self-pay | Admitting: Physical Therapy

## 2021-10-24 ENCOUNTER — Ambulatory Visit: Payer: Medicare Other | Admitting: Physical Therapy

## 2021-10-24 DIAGNOSIS — M25562 Pain in left knee: Secondary | ICD-10-CM | POA: Insufficient documentation

## 2021-10-24 DIAGNOSIS — Z96651 Presence of right artificial knee joint: Secondary | ICD-10-CM | POA: Diagnosis not present

## 2021-10-24 DIAGNOSIS — M1712 Unilateral primary osteoarthritis, left knee: Secondary | ICD-10-CM | POA: Diagnosis not present

## 2021-10-24 DIAGNOSIS — R279 Unspecified lack of coordination: Secondary | ICD-10-CM | POA: Insufficient documentation

## 2021-10-24 DIAGNOSIS — M6281 Muscle weakness (generalized): Secondary | ICD-10-CM | POA: Insufficient documentation

## 2021-10-24 DIAGNOSIS — R2689 Other abnormalities of gait and mobility: Secondary | ICD-10-CM | POA: Diagnosis not present

## 2021-10-24 DIAGNOSIS — G8929 Other chronic pain: Secondary | ICD-10-CM | POA: Diagnosis not present

## 2021-10-24 NOTE — Therapy (Signed)
OUTPATIENT PHYSICAL THERAPY LOWER EXTREMITY TREATMENT NOTE   Patient Name: Bridget Cortez MRN: [id number] DOB:Jan 23, 1946, 76 y.o., female Today's Date: 10/24/2021   PT End of Session - 10/24/21 1858     Visit Number 6    Date for PT Re-Evaluation 12/22/21    Authorization Type Medicare    PT Start Time 1230    PT Stop Time 1315    PT Time Calculation (min) 45 min    Equipment Utilized During Treatment Other (comment)   large pool noodle and large single bar bell   Activity Tolerance Patient tolerated treatment well;Other (comment)   initially limited by fear of the water   Behavior During Therapy [facility] for tasks assessed/performed              Past Medical History:  Diagnosis Date   Abnormal Pap smear 2006   vaginal medicine according to patient   Allergy    Arthritis    Asthma    Atrophy of vagina 06/2005   Breast cancer ([place]) 1999   Left   CAP (community acquired pneumonia) 08/06/2014   Cataract    cataracts lt. eye    cataract removed.   Chiari malformation    Diverticulosis    Dysrhythmia    SVT   GERD (gastroesophageal reflux disease)    H/O osteopenia    08/2006   H/O varicella    Heart murmur    Hypertension    Monilial vaginitis 07/2007   Multiple allergies    Ovarian cyst    Personal history of colonic adenomas 03/13/2012   Personal history of radiation therapy 1990   Pneumonia 07/2014   VIN III (vulvar intraepithelial neoplasia III) 03/2009   Yeast infection    Past Surgical History:  Procedure Laterality Date   ABDOMINAL HYSTERECTOMY     ABDOMINAL SURGERY     BREAST EXCISIONAL BIOPSY Right    benign   BREAST LUMPECTOMY Left 1990   BREAST SURGERY     Lumpectomy, left breast   CARDIOVERSION N/A 07/28/2020   Procedure: CARDIOVERSION;  Surgeon: Donato Heinz, MD;  Location: [place];  Service: Cardiovascular;  Laterality: N/A;   CATARACT EXTRACTION Left 2018   COLONOSCOPY     COLONOSCOPY W/ BIOPSIES     EYE SURGERY     HERNIA  REPAIR     KNEE SURGERY     right   lt. eye aneurysm surgery Left 2017   MOUTH SURGERY  2019   rt. side gum surgery  Lt. done 3 years ago   RADIOACTIVE SEED GUIDED EXCISIONAL BREAST BIOPSY Right 07/09/2018   Procedure: RADIOACTIVE SEED GUIDED EXCISIONAL RIGHT  BREAST BIOPSY;  Surgeon: Stark Klein, MD;  Location: [place];  Service: General;  Laterality: Right;   TOTAL KNEE ARTHROPLASTY Right 09/09/2019   Procedure: RIGHT TOTAL KNEE ARTHROPLASTY;  Surgeon: Melrose Nakayama, MD;  Location: [facility];  Service: Orthopedics;  Laterality: Right;   Patient Active Problem List   Diagnosis Date Noted   Leukocytopenia 09/15/2020   Deficiency anemia 09/15/2020   Persistent atrial fibrillation (HCC)    Primary osteoarthritis of right knee 09/09/2019   (HFpEF) heart failure with preserved ejection fraction ([place]) 04/13/2019   SVT (supraventricular tachycardia) 08/04/2017   Wide-complex tachycardia 08/03/2017   Asthma 08/06/2014   Anxiety about health 08/06/2014   Essential hypertension 08/06/2014   Personal history of colonic adenomas 03/20/2012   Chiari malformation    Heart murmur    H/O varicella    Yeast infection  Cancer ([place])    H/O osteopenia    Abnormal Pap smear    Vulvar intraepithelial neoplasia III (VIN III) 04/12/2009   Atrophy of vagina 06/23/2005    PCP: [name]  REFERRING PROVIDER: Melrose Nakayama  REFERRING DIAG: L knee pain sp R TKA in 2021  THERAPY DIAG:  Other abnormalities of gait and mobility  Muscle weakness (generalized)  Chronic pain of left knee  Rationale for Evaluation and Treatment Rehabilitation  ONSET DATE: 09/13/21  SUBJECTIVE:   SUBJECTIVE STATEMENT:  Pt reports she fell last night in her home - states she was closing the blinds around 6:30 and thinks her shoe got caught on the carpet; pt reports she hurt her Rt knee (the one with the TKR); went to Dr. Rhona Raider this morning to get it checked out and he said her Rt knee was fine  PERTINENT  HISTORY: R TKA, heart failure, edema in ankles, HTN, Chiari malformation   PAIN:  Are you having pain? Yes: NPRS scale: 3/10 Pain location: Rt knee Pain description: dull, achy  Aggravating factors: standing and walking for too long  :  fall sustained on evening of 10-23-21 caused the pain in Rt knee  Relieving factors: rubbing it with alcohol, Voltaren   PRECAUTIONS: Fall  WEIGHT BEARING RESTRICTIONS No  FALLS:  Has patient fallen in last 6 months? Yes. Number of falls 1  LIVING ENVIRONMENT: Lives with: lives with their spouse Lives in: House/apartment Stairs: Yes: External: 3 steps; on right going up Has following equipment at home: Single point cane and Walker - 2 wheeled  OCCUPATION: retired  PLOF: Independent  PATIENT GOALS to be able to stand and fix my posture    OBJECTIVE:    10-24-21 -  Aquatic therapy at [facility] - pool temp 90 degrees; pt using SPC  for assistance with amb. But used facility's wheelchair to access pool area from front desk (approx. 150') due to prolonged distance  Patient seen for aquatic therapy today.  Treatment took place in water 3.6-4.0 feet deep depending upon activity.  Pt entered and exited  the pool via step negotiation with use of bilateral hand rails using step by step sequence with CGA.  Pt very fearful of water upon entering the pool;  required use of noodle for UE support with mod assist for stabilization and for increased confidence and security; pt just stood in 3.8' water depth to get acclimated to pool/water current producing mild perturbations to balance  Pt performed water walking 18' x 8 reps across pool with use of noodle with mod assist initially, progressing to min assist as pt felt more confident and less fearful as session progressed  Pt performed standing bil. LE strengthening exercises with bil. UE support on pool edge - standing hip flexion with knee extended 10 reps; standing hip abduction 10 reps each leg and marching  10 reps each leg  Small range mini squats 10 reps - pt held onto edge of pool for bil. UE support   Sidestepping with UE support on large bar bell with mod assist for stabilization and for security to reduce anxiety with aquatic therapy session - 18' x 4 reps  Pt requires buoyancy of water for support for reduced fall risk and also for joint offloading to decrease stress and strain on knees with weight bearing; buoyancy resisted exercises provide resistance as well as the viscosity of water providing resistance for strengthening; buoyancy assisted exercises provide increased active assisted ROM.  Water current provides perturbations for  improved static & dynamic standing balance and for improved trunk/core stabilization   PATIENT EDUCATION:  Education details: benefits of aquatic therapy Person educated: Patient Education method: Explanation Education comprehension: verbalized understanding   HOME EXERCISE PROGRAM: TBD  ASSESSMENT:  CLINICAL IMPRESSION: Aquatic therapy session focused initially on allowing pt to acclimate to aquatic environment due to increased fear and anxiety in being in the water.  Pt improved in confidence with reduced nervousness and anxiousness after approx. first half of session.  Pt used noodle and the single bar bell for UE support with stabilization provided by PT for stability.  Pt improved performance as session progressed with reduced fear of water.  Pt reported feeling good at end of session.  Cont with POC.  OBJECTIVE IMPAIRMENTS Abnormal gait, decreased balance, decreased mobility, difficulty walking, decreased ROM, decreased strength, decreased safety awareness, impaired UE functional use, improper body mechanics, obesity, and pain.   ACTIVITY LIMITATIONS carrying, lifting, bending, sitting, standing, squatting, stairs, transfers, and locomotion level  PARTICIPATION LIMITATIONS: meal prep, cleaning, laundry, shopping, and community activity  PERSONAL  FACTORS Age, Behavior pattern, Time since onset of injury/illness/exacerbation, and 1-2 comorbidities: obesity, Chiari malformation, heart murmur   are also affecting patient's functional outcome.   REHAB POTENTIAL: Fair    CLINICAL DECISION MAKING: Evolving/moderate complexity  EVALUATION COMPLEXITY: Low   GOALS: Goals reviewed with patient? Yes  SHORT TERM GOALS: Target date: 11/03/21  Patient will be independent with initial HEP. Goal status: INITIAL   LONG TERM GOALS: Target date: 12/08/21  Patient will be independent with advanced/ongoing HEP to improve outcomes and carryover.  Goal status: INITIAL  2.  Patient will report at least 75% improvement in L knee pain to improve QOL. Baseline: 7/10 Goal status: ongoing  3.  Patient will demonstrate improved functional LE strength as demonstrated by 4/5 in BLE. Baseline: 3+/5 Goal status: ongoing  5.  Patient will be able to ambulate 300' with LRAD and normal gait pattern without increased pain to access community.  Baseline: walking w/SPC Goal status: INITIAL  6. Patient will be able to ascend/descend stairs with 1 HR and reciprocal step pattern safely to access home and community.  Goal status: INITIAL  7.  Patient will report 45 on FOTO (patient reported outcome measure) to demonstrate improved functional ability. Baseline: 38 Goal status: INITIAL   PLAN: PT FREQUENCY: 1-2x/week  PT DURATION: 10 weeks  PLANNED INTERVENTIONS: Therapeutic exercises, Therapeutic activity, Neuromuscular re-education, Balance training, Gait training, Patient/Family education, Self Care, Joint mobilization, Stair training, Electrical stimulation, Cryotherapy, Moist heat, Taping, Vasopneumatic device, Traction, Ionotophoresis '4mg'$ /ml Dexamethasone, and Manual therapy  PLAN FOR NEXT SESSION: low level LE strengthening, gait training, Nustep, balance     Guido Sander, PT 10/24/2021, 7:51 PM  [place] [address], [place], [place] [address] [phone]

## 2021-10-26 NOTE — Therapy (Signed)
OUTPATIENT PHYSICAL THERAPY LOWER EXTREMITY Treatment   Patient Name: Bridget Cortez MRN: [id number] DOB:06/04/1945, 76 y.o., female Today's Date: 10/27/2021   PT End of Session - 10/27/21 1633     Visit Number 7    Date for PT Re-Evaluation 12/22/21    Authorization Type Medicare    PT Start Time 1630    PT Stop Time 9024    PT Time Calculation (min) 45 min    Equipment Utilized During Treatment Other (comment)   large pool noodle and large single bar bell   Activity Tolerance Patient tolerated treatment well;Other (comment)   initially limited by fear of the water   Behavior During Therapy [facility] for tasks assessed/performed               Past Medical History:  Diagnosis Date   Abnormal Pap smear 2006   vaginal medicine according to patient   Allergy    Arthritis    Asthma    Atrophy of vagina 06/2005   Breast cancer ([place]) 1999   Left   CAP (community acquired pneumonia) 08/06/2014   Cataract    cataracts lt. eye    cataract removed.   Chiari malformation    Diverticulosis    Dysrhythmia    SVT   GERD (gastroesophageal reflux disease)    H/O osteopenia    08/2006   H/O varicella    Heart murmur    Hypertension    Monilial vaginitis 07/2007   Multiple allergies    Ovarian cyst    Personal history of colonic adenomas 03/13/2012   Personal history of radiation therapy 1990   Pneumonia 07/2014   VIN III (vulvar intraepithelial neoplasia III) 03/2009   Yeast infection    Past Surgical History:  Procedure Laterality Date   ABDOMINAL HYSTERECTOMY     ABDOMINAL SURGERY     BREAST EXCISIONAL BIOPSY Right    benign   BREAST LUMPECTOMY Left 1990   BREAST SURGERY     Lumpectomy, left breast   CARDIOVERSION N/A 07/28/2020   Procedure: CARDIOVERSION;  Surgeon: Donato Heinz, MD;  Location: [place];  Service: Cardiovascular;  Laterality: N/A;   CATARACT EXTRACTION Left 2018   COLONOSCOPY     COLONOSCOPY W/ BIOPSIES     EYE SURGERY     HERNIA  REPAIR     KNEE SURGERY     right   lt. eye aneurysm surgery Left 2017   MOUTH SURGERY  2019   rt. side gum surgery  Lt. done 3 years ago   RADIOACTIVE SEED GUIDED EXCISIONAL BREAST BIOPSY Right 07/09/2018   Procedure: RADIOACTIVE SEED GUIDED EXCISIONAL RIGHT  BREAST BIOPSY;  Surgeon: Stark Klein, MD;  Location: [place];  Service: General;  Laterality: Right;   TOTAL KNEE ARTHROPLASTY Right 09/09/2019   Procedure: RIGHT TOTAL KNEE ARTHROPLASTY;  Surgeon: Melrose Nakayama, MD;  Location: [facility];  Service: Orthopedics;  Laterality: Right;   Patient Active Problem List   Diagnosis Date Noted   Leukocytopenia 09/15/2020   Deficiency anemia 09/15/2020   Persistent atrial fibrillation (HCC)    Primary osteoarthritis of right knee 09/09/2019   (HFpEF) heart failure with preserved ejection fraction ([place]) 04/13/2019   SVT (supraventricular tachycardia) 08/04/2017   Wide-complex tachycardia 08/03/2017   Asthma 08/06/2014   Anxiety about health 08/06/2014   Essential hypertension 08/06/2014   Personal history of colonic adenomas 03/20/2012   Chiari malformation    Heart murmur    H/O varicella    Yeast infection  Cancer ([place])    H/O osteopenia    Abnormal Pap smear    Vulvar intraepithelial neoplasia III (VIN III) 04/12/2009   Atrophy of vagina 06/23/2005    PCP: [name]  REFERRING PROVIDER: Melrose Nakayama  REFERRING DIAG: L knee pain sp R TKA in 2021  THERAPY DIAG:  Other abnormalities of gait and mobility  Muscle weakness (generalized)  Chronic pain of left knee  Unspecified lack of coordination  Rationale for Evaluation and Treatment Rehabilitation  ONSET DATE: 09/13/21  SUBJECTIVE:   SUBJECTIVE STATEMENT: "I had an accident on Sunday, I fell onto my knee trying to close the drapes. I went to the doctor on Monday and they did X-rays and said nothing was wrong with it."  PERTINENT HISTORY: R TKA, heart failure, edema in ankles, HTN, Chiari malformation    PAIN:  Are you having pain? Yes: NPRS scale: 0/10 Pain location: L knee Pain description: dull, achy  Aggravating factors: standing and walking for too long  Relieving factors: rubbing it with alcohol, Voltaren   PRECAUTIONS: Fall  WEIGHT BEARING RESTRICTIONS No  FALLS:  Has patient fallen in last 6 months? Yes. Number of falls 1  LIVING ENVIRONMENT: Lives with: lives with their spouse Lives in: House/apartment Stairs: Yes: External: 3 steps; on right going up Has following equipment at home: Single point cane and Walker - 2 wheeled  OCCUPATION: retired  PLOF: Independent  PATIENT GOALS to be able to stand and fix my posture    OBJECTIVE:   PATIENT SURVEYS:  FOTO 38/100  COGNITION:  Overall cognitive status: Within functional limits for tasks assessed     SENSATION: WFL   POSTURE: rounded shoulders, forward head, increased thoracic kyphosis, and flexed trunk    LOWER EXTREMITY ROM:  WFL    LOWER EXTREMITY MMT:  MMT Right eval Left eval  Hip flexion 3+ 3+  Hip extension    Hip abduction 4- 4-  Hip adduction 4- 4-  Hip internal rotation    Hip external rotation    Knee flexion 3+ 3+  Knee extension 3+ 3+  Ankle dorsiflexion    Ankle plantarflexion    Ankle inversion    Ankle eversion     (Blank rows = not tested)   FUNCTIONAL TESTS:  5 times sit to stand: unable to complete Timed up and go (TUG): 45.53s  GAIT: Distance walked: in clinic distances Assistive device utilized: Single point cane Level of assistance: Modified independence Comments: slowed gait, unsteady, decreased step length, decreased arm swing    TODAY'S TREATMENT: 10/27/21 NuStep L4 x61mns  LAQ 1.5# 2x10  HS curls redTB 2x10  Seated clamshells redTB 2x10  Heel taps 4" holding on to rail 20 reps   10/20/21 NuStep L3 x 6 minutes Seated long kicks, no weight 2x10 reps each Sit to stand from elevated mat 2x5 reps Hamstring curls red x10 Standing chest press and  shoulder flex holding cane in BUE 5 reps each-reported L knee felt slightly unstable. Knee add ball squeeze   10/14/21 NuStep L3 x 6 minutes Unable to tolerate Supine Seated long kicks, no weight due to sore knee after last treatment 10 reps each Seated lateral trunk flexion, touching elbow to  mat on each side 10 reps each Lat lean to R lift LLE up to the side to the top of mat x 5, repeat with RLE Sit to stand from elevated mat with BUE on the back of chair turned backwards, 5 reps Practiced sit to stand from  regular chair with armrests, emphasizing forward lean-6  reps Standing chest press and sB shoulder flex holding cane in BUE 5 reps each-reported L knee felt slightly unstable.   09/1821 NuStep L2 x 6 min LE only Red tband HS curls 2x10 LAQ 2lb 2x10  Seated March 2lb 2x10 Ball b/n knees squeeze Seated toe rasises and heel raises   10/04/21 Nustep level 3 x 6 minutes LE's only Red tband HS curls LAQ's 2x10 Ball b/n knees squeeze Red tband clamshells in sitting Seated toe rasises and heel raises Education on the pool program    PATIENT EDUCATION:  Education details: POC Person educated: Patient Education method: Explanation Education comprehension: verbalized understanding   HOME EXERCISE PROGRAM: TBD  ASSESSMENT:  CLINICAL IMPRESSION: Patient experienced a fall this week, did not hit her head and saw her doctor for a follow up. She has some R ankle swelling today. We worked on LE strengthening today.  Patient would like to work on transfers by getting on her knees and learning how to get back up if she falls.   OBJECTIVE IMPAIRMENTS Abnormal gait, decreased balance, decreased mobility, difficulty walking, decreased ROM, decreased strength, decreased safety awareness, impaired UE functional use, improper body mechanics, obesity, and pain.   ACTIVITY LIMITATIONS carrying, lifting, bending, sitting, standing, squatting, stairs, transfers, and locomotion  level  PARTICIPATION LIMITATIONS: meal prep, cleaning, laundry, shopping, and community activity  PERSONAL FACTORS Age, Behavior pattern, Time since onset of injury/illness/exacerbation, and 1-2 comorbidities: obesity, Chiari malformation, heart murmur   are also affecting patient's functional outcome.   REHAB POTENTIAL: Fair    CLINICAL DECISION MAKING: Evolving/moderate complexity  EVALUATION COMPLEXITY: Low   GOALS: Goals reviewed with patient? Yes  SHORT TERM GOALS: Target date: 11/03/21  Patient will be independent with initial HEP. Goal status: INITIAL   LONG TERM GOALS: Target date: 12/08/21  Patient will be independent with advanced/ongoing HEP to improve outcomes and carryover.  Goal status: INITIAL  2.  Patient will report at least 75% improvement in L knee pain to improve QOL. Baseline: 7/10 Goal status: ongoing  3.  Patient will demonstrate improved functional LE strength as demonstrated by 4/5 in BLE. Baseline: 3+/5 Goal status: ongoing  5.  Patient will be able to ambulate 300' with LRAD and normal gait pattern without increased pain to access community.  Baseline: walking w/SPC Goal status: INITIAL  6. Patient will be able to ascend/descend stairs with 1 HR and reciprocal step pattern safely to access home and community.  Goal status: INITIAL  7.  Patient will report 69 on FOTO (patient reported outcome measure) to demonstrate improved functional ability. Baseline: 38 Goal status: INITIAL   PLAN: PT FREQUENCY: 1-2x/week  PT DURATION: 10 weeks  PLANNED INTERVENTIONS: Therapeutic exercises, Therapeutic activity, Neuromuscular re-education, Balance training, Gait training, Patient/Family education, Self Care, Joint mobilization, Stair training, Electrical stimulation, Cryotherapy, Moist heat, Taping, Vasopneumatic device, Traction, Ionotophoresis '4mg'$ /ml Dexamethasone, and Manual therapy  PLAN FOR NEXT SESSION: black mat fall   Marcelina Morel,  DPT 10/27/2021, 5:17 PM

## 2021-10-27 ENCOUNTER — Ambulatory Visit: Payer: Medicare Other

## 2021-10-27 DIAGNOSIS — M6281 Muscle weakness (generalized): Secondary | ICD-10-CM | POA: Diagnosis not present

## 2021-10-27 DIAGNOSIS — R2689 Other abnormalities of gait and mobility: Secondary | ICD-10-CM

## 2021-10-27 DIAGNOSIS — G8929 Other chronic pain: Secondary | ICD-10-CM | POA: Diagnosis not present

## 2021-10-27 DIAGNOSIS — R279 Unspecified lack of coordination: Secondary | ICD-10-CM | POA: Diagnosis not present

## 2021-10-27 DIAGNOSIS — M25562 Pain in left knee: Secondary | ICD-10-CM | POA: Diagnosis not present

## 2021-10-31 ENCOUNTER — Ambulatory Visit: Payer: Medicare Other | Admitting: Physical Therapy

## 2021-11-01 ENCOUNTER — Inpatient Hospital Stay: Payer: Medicare Other | Attending: Hematology and Oncology

## 2021-11-01 ENCOUNTER — Other Ambulatory Visit: Payer: Self-pay

## 2021-11-01 ENCOUNTER — Inpatient Hospital Stay (HOSPITAL_BASED_OUTPATIENT_CLINIC_OR_DEPARTMENT_OTHER): Payer: Medicare Other | Admitting: Internal Medicine

## 2021-11-01 VITALS — BP 127/76 | HR 87 | Temp 98.4°F | Resp 16 | Wt 251.1 lb

## 2021-11-01 DIAGNOSIS — D72819 Decreased white blood cell count, unspecified: Secondary | ICD-10-CM | POA: Insufficient documentation

## 2021-11-01 DIAGNOSIS — Z79899 Other long term (current) drug therapy: Secondary | ICD-10-CM | POA: Insufficient documentation

## 2021-11-01 DIAGNOSIS — Z7901 Long term (current) use of anticoagulants: Secondary | ICD-10-CM | POA: Insufficient documentation

## 2021-11-01 DIAGNOSIS — D539 Nutritional anemia, unspecified: Secondary | ICD-10-CM | POA: Diagnosis not present

## 2021-11-01 DIAGNOSIS — Z7951 Long term (current) use of inhaled steroids: Secondary | ICD-10-CM | POA: Diagnosis not present

## 2021-11-01 DIAGNOSIS — G935 Compression of brain: Secondary | ICD-10-CM | POA: Diagnosis not present

## 2021-11-01 DIAGNOSIS — I10 Essential (primary) hypertension: Secondary | ICD-10-CM | POA: Insufficient documentation

## 2021-11-01 DIAGNOSIS — D649 Anemia, unspecified: Secondary | ICD-10-CM | POA: Diagnosis not present

## 2021-11-01 DIAGNOSIS — R011 Cardiac murmur, unspecified: Secondary | ICD-10-CM | POA: Diagnosis not present

## 2021-11-01 DIAGNOSIS — K219 Gastro-esophageal reflux disease without esophagitis: Secondary | ICD-10-CM | POA: Insufficient documentation

## 2021-11-01 LAB — CBC WITH DIFFERENTIAL (CANCER CENTER ONLY)
Abs Immature Granulocytes: 0.01 10*3/uL (ref 0.00–0.07)
Basophils Absolute: 0 10*3/uL (ref 0.0–0.1)
Basophils Relative: 1 %
Eosinophils Absolute: 0.1 10*3/uL (ref 0.0–0.5)
Eosinophils Relative: 4 %
HCT: 35.5 % — ABNORMAL LOW (ref 36.0–46.0)
Hemoglobin: 11.6 g/dL — ABNORMAL LOW (ref 12.0–15.0)
Immature Granulocytes: 0 %
Lymphocytes Relative: 32 %
Lymphs Abs: 1.2 10*3/uL (ref 0.7–4.0)
MCH: 27.8 pg (ref 26.0–34.0)
MCHC: 32.7 g/dL (ref 30.0–36.0)
MCV: 85.1 fL (ref 80.0–100.0)
Monocytes Absolute: 0.4 10*3/uL (ref 0.1–1.0)
Monocytes Relative: 10 %
Neutro Abs: 2 10*3/uL (ref 1.7–7.7)
Neutrophils Relative %: 53 %
Platelet Count: 244 10*3/uL (ref 150–400)
RBC: 4.17 MIL/uL (ref 3.87–5.11)
RDW: 16.3 % — ABNORMAL HIGH (ref 11.5–15.5)
WBC Count: 3.6 10*3/uL — ABNORMAL LOW (ref 4.0–10.5)
nRBC: 0 % (ref 0.0–0.2)

## 2021-11-01 LAB — IRON AND IRON BINDING CAPACITY (CC-WL,HP ONLY)
Iron: 58 ug/dL (ref 28–170)
Saturation Ratios: 16 % (ref 10.4–31.8)
TIBC: 365 ug/dL (ref 250–450)
UIBC: 307 ug/dL (ref 148–442)

## 2021-11-01 LAB — FERRITIN: Ferritin: 17 ng/mL (ref 11–307)

## 2021-11-01 NOTE — Progress Notes (Signed)
[place] [phone]   [phone]  OFFICE PROGRESS NOTE  Bridget Solian, MD [place] [address]  DIAGNOSIS:  Leukocytopenia and mild anemia.  Her leukocytopenia is likely drug-induced from her current treatment rosuvastatin.  She also has mild anemia of chronic disease.    PRIOR THERAPY:None  CURRENT THERAPY: None  INTERVAL HISTORY: Bridget Cortez 76 y.o. female returns to the clinic today for 6 months follow-up visit accompanied by her husband.  The patient is feeling fine today with no concerning complaints.  She had a fall few weeks ago and hurt her right knee but no fracture or injuries.  She denied having any current chest pain but has shortness of breath with exertion with no cough or hemoptysis.  She has no nausea, vomiting, diarrhea or constipation.  She has no headache or visual changes.  She has no weight loss or night sweats.  She is here today for evaluation and repeat blood work.   MEDICAL HISTORY: Past Medical History:  Diagnosis Date   Abnormal Pap smear 2006   vaginal medicine according to patient   Allergy    Arthritis    Asthma    Atrophy of vagina 06/2005   Breast cancer ([place] [facility]) 1999   Left   CAP (community acquired pneumonia) 08/06/2014   Cataract    cataracts lt. eye    cataract removed.   Chiari malformation    Diverticulosis    Dysrhythmia    SVT   GERD (gastroesophageal reflux disease)    H/O osteopenia    08/2006   H/O varicella    Heart murmur    Hypertension    Monilial vaginitis 07/2007   Multiple allergies    Ovarian cyst    Personal history of colonic adenomas 03/13/2012   Personal history of radiation therapy 1990   Pneumonia 07/2014   VIN III (vulvar intraepithelial neoplasia III) 03/2009   Yeast infection     ALLERGIES:  is allergic to ephedrine and cortisone.  MEDICATIONS:  Current Outpatient Medications  Medication Sig Dispense Refill   acetaminophen (TYLENOL)  325 MG tablet Take 2 tablets (650 mg total) by mouth every 6 (six) hours as needed for mild pain, fever or headache (or Fever >/= 101).     albuterol (PROVENTIL) (5 MG/ML) 0.5% nebulizer solution Take 0.5 mLs (2.5 mg total) by nebulization every 6 (six) hours as needed for wheezing or shortness of breath. (Patient taking differently: Take 2.5 mg by nebulization daily as needed for wheezing or shortness of breath.) 20 mL 12   amLODipine (NORVASC) 10 MG tablet Take 5 mg by mouth at bedtime.      Calcium Carbonate-Vitamin D (CALTRATE 600+D PO) Take 1 tablet by mouth daily with lunch.      cetirizine (ZYRTEC) 10 MG tablet Take 10 mg by mouth daily.     esomeprazole (NEXIUM) 40 MG capsule Take 40 mg by mouth 2 (two) times daily before a meal.      fluticasone (FLONASE) 50 MCG/ACT nasal spray Place 1 spray into both nostrils at bedtime.      Fluticasone-Salmeterol (ADVAIR) 250-50 MCG/DOSE AEPB Inhale 1 puff into the lungs every 12 (twelve) hours.     Multiple Vitamin (MULTIVITAMIN) tablet Take 1 tablet by mouth daily with lunch.      nebivolol (BYSTOLIC) 5 MG tablet Take 1 tablet (5 mg total) by mouth daily. 90 tablet 1   potassium chloride SA (KLOR-CON) 20 MEQ tablet Take  20 mEq by mouth 3 (three) times daily.     rivaroxaban (XARELTO) 20 MG TABS tablet TAKE 1 TABLET BY MOUTH ONCE DAILY WITH SUPPER 90 tablet 1   rosuvastatin (CRESTOR) 5 MG tablet Take 5 mg by mouth daily.     simethicone (MYLICON) 944 MG chewable tablet Chew 125 mg by mouth every 6 (six) hours as needed for flatulence. (Patient not taking: Reported on 05/26/2021)     telmisartan (MICARDIS) 40 MG tablet Take 40 mg by mouth daily.     torsemide (DEMADEX) 20 MG tablet Take 1 tablet by mouth once daily 90 tablet 0   traMADol (ULTRAM) 50 MG tablet Take 50 mg by mouth as needed. (Patient not taking: Reported on 05/26/2021)     traZODone (DESYREL) 50 MG tablet Take 25 mg by mouth at bedtime as needed for sleep. (Patient not taking: Reported on  05/26/2021)     triamcinolone cream (KENALOG) 0.1 % Apply 1 application topically daily as needed ([place]). (Patient not taking: Reported on 05/26/2021)     vitamin C (ASCORBIC ACID) 500 MG tablet Take 500 mg by mouth daily.     No current facility-administered medications for this visit.    SURGICAL HISTORY:  Past Surgical History:  Procedure Laterality Date   ABDOMINAL HYSTERECTOMY     ABDOMINAL SURGERY     BREAST EXCISIONAL BIOPSY Right    benign   BREAST LUMPECTOMY Left 1990   BREAST SURGERY     Lumpectomy, left breast   CARDIOVERSION N/A 07/28/2020   Procedure: CARDIOVERSION;  Surgeon: Donato Heinz, MD;  Location: [place];  Service: Cardiovascular;  Laterality: N/A;   CATARACT EXTRACTION Left 2018   COLONOSCOPY     COLONOSCOPY W/ BIOPSIES     EYE SURGERY     HERNIA REPAIR     KNEE SURGERY     right   lt. eye aneurysm surgery Left 2017   MOUTH SURGERY  2019   rt. side gum surgery  Lt. done 3 years ago   RADIOACTIVE SEED GUIDED EXCISIONAL BREAST BIOPSY Right 07/09/2018   Procedure: RADIOACTIVE SEED GUIDED EXCISIONAL RIGHT  BREAST BIOPSY;  Surgeon: Stark Klein, MD;  Location: [place];  Service: General;  Laterality: Right;   TOTAL KNEE ARTHROPLASTY Right 09/09/2019   Procedure: RIGHT TOTAL KNEE ARTHROPLASTY;  Surgeon: Melrose Nakayama, MD;  Location: [facility];  Service: Orthopedics;  Laterality: Right;    REVIEW OF SYSTEMS:  A comprehensive review of systems was negative except for: Constitutional: positive for fatigue   PHYSICAL EXAMINATION: General appearance: alert, cooperative, fatigued, and no distress Head: Normocephalic, without obvious abnormality, atraumatic Neck: no adenopathy, no JVD, supple, symmetrical, trachea midline, and thyroid not enlarged, symmetric, no tenderness/mass/nodules Lymph nodes: Cervical, supraclavicular, and axillary nodes normal. Resp: clear to auscultation bilaterally Back: symmetric, no curvature. ROM normal. No CVA tenderness. Cardio:  regular rate and rhythm, S1, S2 normal, no murmur, click, rub or gallop GI: soft, non-tender; bowel sounds normal; no masses,  no organomegaly Extremities: extremities normal, atraumatic, no cyanosis or edema  ECOG PERFORMANCE STATUS: 1 - Symptomatic but completely ambulatory  Blood pressure 127/76, pulse 87, temperature 98.4 F (36.9 C), temperature source Oral, resp. rate 16, weight 251 lb 1.6 oz (113.9 kg), SpO2 100 %.  LABORATORY DATA: Lab Results  Component Value Date   WBC 3.6 (L) 11/01/2021   HGB 11.6 (L) 11/01/2021   HCT 35.5 (L) 11/01/2021   MCV 85.1 11/01/2021   PLT 244 11/01/2021  Chemistry      Component Value Date/Time   NA 135 09/15/2020 1121   K 3.6 09/15/2020 1121   CL 101 09/15/2020 1121   CO2 26 09/15/2020 1121   BUN 11 09/15/2020 1121   CREATININE 0.79 09/15/2020 1121      Component Value Date/Time   CALCIUM 9.2 09/15/2020 1121   ALKPHOS 87 09/15/2020 1121   AST 18 09/15/2020 1121   ALT 14 09/15/2020 1121   BILITOT 0.5 09/15/2020 1121       RADIOGRAPHIC STUDIES: No results found.  ASSESSMENT AND PLAN: This is a very pleasant 76 years old African-American female with persistent leukocytopenia and anemia of unclear etiology.  It was initially thought to be drug-induced but there is no improvement in her condition. The patient underwent a bone marrow biopsy and aspirate.  I personally discussed the result with the patient and her husband.  Her biopsy showed no concerning bone marrow abnormality except for a slight increase of plasma cells but representing 4%. She is currently on observation and feeling fine with no concerning complaints except for mild fatigue. Repeat CBC showed stable mild leukocytopenia and anemia. I recommended for the patient to continue on observation with repeat blood work in 6 months. She was advised to call immediately if she has any other concerning symptoms in the interval. The patient voices understanding of current  disease status and treatment options and is in agreement with the current care plan.  All questions were answered. The patient knows to call the clinic with any problems, questions or concerns. We can certainly see the patient much sooner if necessary.   Disclaimer: This note was dictated with voice recognition software. Similar sounding words can inadvertently be transcribed and may not be corrected upon review.

## 2021-11-02 ENCOUNTER — Encounter: Payer: Self-pay | Admitting: Physical Therapy

## 2021-11-02 ENCOUNTER — Ambulatory Visit: Payer: Self-pay | Admitting: Physical Therapy

## 2021-11-02 DIAGNOSIS — M6281 Muscle weakness (generalized): Secondary | ICD-10-CM | POA: Diagnosis not present

## 2021-11-02 DIAGNOSIS — M25562 Pain in left knee: Secondary | ICD-10-CM | POA: Diagnosis not present

## 2021-11-02 DIAGNOSIS — G8929 Other chronic pain: Secondary | ICD-10-CM

## 2021-11-02 DIAGNOSIS — R2689 Other abnormalities of gait and mobility: Secondary | ICD-10-CM

## 2021-11-02 DIAGNOSIS — R279 Unspecified lack of coordination: Secondary | ICD-10-CM | POA: Diagnosis not present

## 2021-11-02 NOTE — Therapy (Signed)
OUTPATIENT PHYSICAL THERAPY LOWER EXTREMITY TREATMENT NOTE   Patient Name: Bridget Cortez MRN: [id number] DOB:[birth year], 76 y.o., female Today's Date: 11/02/2021   PT End of Session - 11/02/21 1958     Visit Number 8    Date for PT Re-Evaluation 12/22/21    Authorization Type Medicare    PT Start Time 1450    PT Stop Time 1535    PT Time Calculation (min) 45 min    Equipment Utilized During Treatment Other (comment)   large pool noodle and large single bar bell   Activity Tolerance Patient tolerated treatment well;Other (comment)   initially limited by fear of the water   Behavior During Therapy [facility] for tasks assessed/performed              Past Medical History:  Diagnosis Date   Abnormal Pap smear 2006   vaginal medicine according to patient   Allergy    Arthritis    Asthma    Atrophy of vagina 06/2005   Breast cancer ([place]) 1999   Left   CAP (community acquired pneumonia) 08/06/2014   Cataract    cataracts lt. eye    cataract removed.   Chiari malformation    Diverticulosis    Dysrhythmia    SVT   GERD (gastroesophageal reflux disease)    H/O osteopenia    08/2006   H/O varicella    Heart murmur    Hypertension    Monilial vaginitis 07/2007   Multiple allergies    Ovarian cyst    Personal history of colonic adenomas 03/13/2012   Personal history of radiation therapy 1990   Pneumonia 07/2014   VIN III (vulvar intraepithelial neoplasia III) 03/2009   Yeast infection    Past Surgical History:  Procedure Laterality Date   ABDOMINAL HYSTERECTOMY     ABDOMINAL SURGERY     BREAST EXCISIONAL BIOPSY Right    benign   BREAST LUMPECTOMY Left 1990   BREAST SURGERY     Lumpectomy, left breast   CARDIOVERSION N/A 07/28/2020   Procedure: CARDIOVERSION;  Surgeon: Donato Heinz, MD;  Location: [place];  Service: Cardiovascular;  Laterality: N/A;   CATARACT EXTRACTION Left 2018   COLONOSCOPY     COLONOSCOPY W/ BIOPSIES     EYE SURGERY      HERNIA REPAIR     KNEE SURGERY     right   lt. eye aneurysm surgery Left 2017   MOUTH SURGERY  2019   rt. side gum surgery  Lt. done 3 years ago   RADIOACTIVE SEED GUIDED EXCISIONAL BREAST BIOPSY Right 07/09/2018   Procedure: RADIOACTIVE SEED GUIDED EXCISIONAL RIGHT  BREAST BIOPSY;  Surgeon: Stark Klein, MD;  Location: [place];  Service: General;  Laterality: Right;   TOTAL KNEE ARTHROPLASTY Right 09/09/2019   Procedure: RIGHT TOTAL KNEE ARTHROPLASTY;  Surgeon: Melrose Nakayama, MD;  Location: [facility];  Service: Orthopedics;  Laterality: Right;   Patient Active Problem List   Diagnosis Date Noted   Leukocytopenia 09/15/2020   Deficiency anemia 09/15/2020   Persistent atrial fibrillation (HCC)    Primary osteoarthritis of right knee 09/09/2019   (HFpEF) heart failure with preserved ejection fraction ([place]) 04/13/2019   SVT (supraventricular tachycardia) 08/04/2017   Wide-complex tachycardia 08/03/2017   Asthma 08/06/2014   Anxiety about health 08/06/2014   Essential hypertension 08/06/2014   Personal history of colonic adenomas 03/20/2012   Chiari malformation    Heart murmur    H/O varicella    Yeast infection  Cancer ([place])    H/O osteopenia    Abnormal Pap smear    Vulvar intraepithelial neoplasia III (VIN III) 04/12/2009   Atrophy of vagina 06/23/2005    PCP: [name]  REFERRING PROVIDER: Melrose Nakayama  REFERRING DIAG: L knee pain sp R TKA in 2021  THERAPY DIAG:  Other abnormalities of gait and mobility  Muscle weakness (generalized)  Chronic pain of left knee  Rationale for Evaluation and Treatment Rehabilitation  ONSET DATE: 09/13/21  SUBJECTIVE:   SUBJECTIVE STATEMENT:  Pt reports she fell last night in her home - states she was closing the blinds around 6:30 and thinks her shoe got caught on the carpet; pt reports she hurt her Rt knee (the one with the TKR); went to Dr. Rhona Raider this morning to get it checked out and he said her Rt knee was  fine  PERTINENT HISTORY: R TKA, heart failure, edema in ankles, HTN, Chiari malformation   PAIN:  Are you having pain? Yes: NPRS scale: 3/10 Pain location: Rt knee Pain description: dull, achy  Aggravating factors: standing and walking for too long  :  fall sustained on evening of 10-23-21 caused the pain in Rt knee  Relieving factors: rubbing it with alcohol, Voltaren   PRECAUTIONS: Fall  WEIGHT BEARING RESTRICTIONS No  FALLS:  Has patient fallen in last 6 months? Yes. Number of falls 1  LIVING ENVIRONMENT: Lives with: lives with their spouse Lives in: House/apartment Stairs: Yes: External: 3 steps; on right going up Has following equipment at home: Single point cane and Walker - 2 wheeled  OCCUPATION: retired  PLOF: Independent  PATIENT GOALS to be able to stand and fix my posture    OBJECTIVE:    11-02-21:  Aquatic therapy at [place] 92 degrees; pt using SPC  for assistance with amb. But used facility's wheelchair to access pool area from front desk (approx. 150') due to prolonged distance  Patient seen for aquatic therapy today.  Treatment took place in water 3.6-4.0 feet deep depending upon activity.  Pt entered and exited  the pool via step negotiation with use of bilateral hand rails using step by step sequence with CGA.  Pt used noodle for UE support with mod assist for stabilization and for increased confidence and security  Trunk stabilization exercise - pt in static standing - moved small bar bells (1 in each hand) - in horizontal abduction/adduction 10 reps and then protraction/retraction 10 reps with min assist for stability by PT  Pt performed water walking 18' x approx. 6  reps across pool with use of single bar bell with mod assist for stabilization by PT  Pt performed standing bil. LE strengthening exercises with bil. UE support on pool edge - standing hip flexion with knee extended 10 reps; standing hip abduction 10 reps each leg and , hip  extension with knee extended 10 reps each leg:  marching 10 reps each leg with bil. UE support on pool edge  Small range mini squats 10 reps x 2 sets - pt held onto edge of pool for bil. UE support   Sidestepping with UE support on large bar bell with mod assist for stabilization and for security to reduce anxiety with aquatic therapy session - 18' x 4 reps Backwards amb. With UE support on bar bell with mod assist for stabilization 18' x 1 rep   Pt requires buoyancy of water for support for reduced fall risk and also for joint offloading to decrease stress  and strain on knees with weight bearing; buoyancy resisted exercises provide resistance as well as the viscosity of water providing resistance for strengthening; buoyancy assisted exercises provide increased active assisted ROM.  Water current provides perturbations for improved static & dynamic standing balance and for improved trunk/core stabilization   PATIENT EDUCATION:  Education details: benefits of aquatic therapy Person educated: Patient Education method: Explanation Education comprehension: verbalized understanding   HOME EXERCISE PROGRAM: TBD  ASSESSMENT:  CLINICAL IMPRESSION: Aquatic therapy session focused on water walking in various directions and LE AROM/strengthening exercises using buoyancy of water for resistance with viscosity of water for strengthening.  Pt was less fearful at start of today's session compared to initial aquatic PT session last week but still required mod assist for stability due to anxiety and fear.  Pt demonstrated increased comfort and less fearfulness as session progressed.  Pt tolerated aquatic exercises well with UE support either on edge of pool or with bar bell stabilized by PT.  Cont with POC.  OBJECTIVE IMPAIRMENTS Abnormal gait, decreased balance, decreased mobility, difficulty walking, decreased ROM, decreased strength, decreased safety awareness, impaired UE functional use, improper body  mechanics, obesity, and pain.   ACTIVITY LIMITATIONS carrying, lifting, bending, sitting, standing, squatting, stairs, transfers, and locomotion level  PARTICIPATION LIMITATIONS: meal prep, cleaning, laundry, shopping, and community activity  PERSONAL FACTORS Age, Behavior pattern, Time since onset of injury/illness/exacerbation, and 1-2 comorbidities: obesity, Chiari malformation, heart murmur   are also affecting patient's functional outcome.   REHAB POTENTIAL: Fair    CLINICAL DECISION MAKING: Evolving/moderate complexity  EVALUATION COMPLEXITY: Low   GOALS: Goals reviewed with patient? Yes  SHORT TERM GOALS: Target date: 11/03/21  Patient will be independent with initial HEP. Goal status: INITIAL   LONG TERM GOALS: Target date: 12/08/21  Patient will be independent with advanced/ongoing HEP to improve outcomes and carryover.  Goal status: INITIAL  2.  Patient will report at least 75% improvement in L knee pain to improve QOL. Baseline: 7/10 Goal status: ongoing  3.  Patient will demonstrate improved functional LE strength as demonstrated by 4/5 in BLE. Baseline: 3+/5 Goal status: ongoing  5.  Patient will be able to ambulate 300' with LRAD and normal gait pattern without increased pain to access community.  Baseline: walking w/SPC Goal status: INITIAL  6. Patient will be able to ascend/descend stairs with 1 HR and reciprocal step pattern safely to access home and community.  Goal status: INITIAL  7.  Patient will report 90 on FOTO (patient reported outcome measure) to demonstrate improved functional ability. Baseline: 38 Goal status: INITIAL   PLAN: PT FREQUENCY: 1-2x/week  PT DURATION: 10 weeks  PLANNED INTERVENTIONS: Therapeutic exercises, Therapeutic activity, Neuromuscular re-education, Balance training, Gait training, Patient/Family education, Self Care, Joint mobilization, Stair training, Electrical stimulation, Cryotherapy, Moist heat, Taping,  Vasopneumatic device, Traction, Ionotophoresis '4mg'$ /ml Dexamethasone, and Manual therapy  PLAN FOR NEXT SESSION: low level LE strengthening, gait training, Nustep, balance     Guido Sander, PT 11/02/2021, 8:02 PM  [facility] [address] [place], [place] [address] [phone]

## 2021-11-03 ENCOUNTER — Ambulatory Visit: Payer: Medicare Other

## 2021-11-07 ENCOUNTER — Ambulatory Visit: Payer: Self-pay | Admitting: Physical Therapy

## 2021-11-07 DIAGNOSIS — R2689 Other abnormalities of gait and mobility: Secondary | ICD-10-CM

## 2021-11-07 DIAGNOSIS — M25562 Pain in left knee: Secondary | ICD-10-CM | POA: Diagnosis not present

## 2021-11-07 DIAGNOSIS — R279 Unspecified lack of coordination: Secondary | ICD-10-CM | POA: Diagnosis not present

## 2021-11-07 DIAGNOSIS — M6281 Muscle weakness (generalized): Secondary | ICD-10-CM

## 2021-11-07 DIAGNOSIS — G8929 Other chronic pain: Secondary | ICD-10-CM | POA: Diagnosis not present

## 2021-11-08 ENCOUNTER — Encounter: Payer: Self-pay | Admitting: Physical Therapy

## 2021-11-08 NOTE — Therapy (Signed)
OUTPATIENT PHYSICAL THERAPY LOWER EXTREMITY TREATMENT NOTE (AQUATIC THERAPY)   Patient Name: Bridget Cortez MRN: [id number] DOB:11-27-1945, 76 y.o., female Today's Date: 11/08/2021   PT End of Session - 11/08/21 1301     Visit Number 9    Date for PT Re-Evaluation 12/22/21    Authorization Type Medicare    PT Start Time 1500   pt 15" late for appt   PT Stop Time 1530    PT Time Calculation (min) 30 min    Equipment Utilized During Treatment Other (comment)   large pool noodle and large single bar bell   Activity Tolerance Patient tolerated treatment well;Other (comment)   initially limited by fear of the water   Behavior During Therapy [facility] for tasks assessed/performed              Past Medical History:  Diagnosis Date   Abnormal Pap smear 2006   vaginal medicine according to patient   Allergy    Arthritis    Asthma    Atrophy of vagina 06/2005   Breast cancer ([place]) 1999   Left   CAP (community acquired pneumonia) 08/06/2014   Cataract    cataracts lt. eye    cataract removed.   Chiari malformation    Diverticulosis    Dysrhythmia    SVT   GERD (gastroesophageal reflux disease)    H/O osteopenia    08/2006   H/O varicella    Heart murmur    Hypertension    Monilial vaginitis 07/2007   Multiple allergies    Ovarian cyst    Personal history of colonic adenomas 03/13/2012   Personal history of radiation therapy 1990   Pneumonia 07/2014   VIN III (vulvar intraepithelial neoplasia III) 03/2009   Yeast infection    Past Surgical History:  Procedure Laterality Date   ABDOMINAL HYSTERECTOMY     ABDOMINAL SURGERY     BREAST EXCISIONAL BIOPSY Right    benign   BREAST LUMPECTOMY Left 1990   BREAST SURGERY     Lumpectomy, left breast   CARDIOVERSION N/A 07/28/2020   Procedure: CARDIOVERSION;  Surgeon: Donato Heinz, MD;  Location: [place];  Service: Cardiovascular;  Laterality: N/A;   CATARACT EXTRACTION Left 2018   COLONOSCOPY     COLONOSCOPY  W/ BIOPSIES     EYE SURGERY     HERNIA REPAIR     KNEE SURGERY     right   lt. eye aneurysm surgery Left 2017   MOUTH SURGERY  2019   rt. side gum surgery  Lt. done 3 years ago   RADIOACTIVE SEED GUIDED EXCISIONAL BREAST BIOPSY Right 07/09/2018   Procedure: RADIOACTIVE SEED GUIDED EXCISIONAL RIGHT  BREAST BIOPSY;  Surgeon: Stark Klein, MD;  Location: [place];  Service: General;  Laterality: Right;   TOTAL KNEE ARTHROPLASTY Right 09/09/2019   Procedure: RIGHT TOTAL KNEE ARTHROPLASTY;  Surgeon: Melrose Nakayama, MD;  [facility];  Service: Orthopedics;  Laterality: Right;   Patient Active Problem List   Diagnosis Date Noted   Leukocytopenia 09/15/2020   Deficiency anemia 09/15/2020   Persistent atrial fibrillation (HCC)    Primary osteoarthritis of right knee 09/09/2019   (HFpEF) heart failure with preserved ejection fraction ([place]) 04/13/2019   SVT (supraventricular tachycardia) 08/04/2017   Wide-complex tachycardia 08/03/2017   Asthma 08/06/2014   Anxiety about health 08/06/2014   Essential hypertension 08/06/2014   Personal history of colonic adenomas 03/20/2012   Chiari malformation    Heart murmur  H/O varicella    Yeast infection    Cancer ([place])    H/O osteopenia    Abnormal Pap smear    Vulvar intraepithelial neoplasia III (VIN III) 04/12/2009   Atrophy of vagina 06/23/2005    PCP: [name]  REFERRING PROVIDER: Melrose Nakayama  REFERRING DIAG: L knee pain sp R TKA in 2021  THERAPY DIAG:  Other abnormalities of gait and mobility  Muscle weakness (generalized)  Chronic pain of left knee  Rationale for Evaluation and Treatment Rehabilitation  ONSET DATE: 09/13/21  SUBJECTIVE:   SUBJECTIVE STATEMENT: Pt reports she is running late because her husband had an appt with his cardiologist this morning, and then they went back home so she could change clothes for her pool appt. ; reports no changes or falls since previous session last Wed.  PERTINENT  HISTORY: R TKA, heart failure, edema in ankles, HTN, Chiari malformation   PAIN:  Are you having pain? Yes: NPRS scale: 3/10 Pain location: Rt knee Pain description: dull, achy  Aggravating factors: standing and walking for too long  :  fall sustained on evening of 10-23-21 caused the pain in Rt knee  Relieving factors: rubbing it with alcohol, Voltaren   PRECAUTIONS: Fall  WEIGHT BEARING RESTRICTIONS No  FALLS:  Has patient fallen in last 6 months? Yes. Number of falls 1  LIVING ENVIRONMENT: Lives with: lives with their spouse Lives in: House/apartment Stairs: Yes: External: 3 steps; on right going up Has following equipment at home: Single point cane and Walker - 2 wheeled  OCCUPATION: retired  PLOF: Independent  PATIENT GOALS to be able to stand and fix my posture    OBJECTIVE:    11-07-21:  Aquatic therapy at [facility] 90 degrees; pt using SPC  for assistance with amb. But used facility's wheelchair to access pool area from front desk (approx. 150') due to prolonged distance  Patient seen for aquatic therapy today.  Treatment took place in water 3.6-4.0 feet deep depending upon activity.  Pt entered and exited  the pool via step negotiation with use of bilateral hand rails using step by step sequence with CGA.  Pt used large single bar bell for UE support with min to mod assist for stabilization and for increased confidence and security  Pt performed water walking 18' x approx. 4 reps across pool with use of single bar bell with mod assist for stabilization by PT  Sidestepping with UE support on large bar bell with mod assist for stabilization - 18' x 4 reps  Pt performed standing bil. LE strengthening exercises with bil. UE support on pool edge - standing hip flexion with knee extended 10 reps; standing hip abduction 10 reps each leg and , hip extension with knee extended 10 reps each leg:  marching 10 reps each leg with bil. UE support on pool  edge  Small range mini squats 10 reps x 2 sets - pt held onto edge of pool for bil. UE support    Pt requires buoyancy of water for support for reduced fall risk and also for joint offloading to decrease stress and strain on knees with weight bearing; buoyancy resisted exercises provide resistance as well as the viscosity of water providing resistance for strengthening; buoyancy assisted exercises provide increased active assisted ROM.  Water current provides perturbations for improved static & dynamic standing balance and for improved trunk/core stabilization   PATIENT EDUCATION:  Education details: benefits of aquatic therapy Person educated: Patient Education method: Explanation  Education comprehension: verbalized understanding   HOME EXERCISE PROGRAM: TBD  ASSESSMENT:  CLINICAL IMPRESSION: Aquatic therapy session focused on water walking in various directions and LE AROM/strengthening exercises using buoyancy of water for resistance with viscosity of water for strengthening.  Pt remains slightly fearful but overall is much less fearful than in previous aquatic PT sessions.   Treatment time in today's aquatic PT session was limited due to pt's later arrival.  Cont with POC.  OBJECTIVE IMPAIRMENTS Abnormal gait, decreased balance, decreased mobility, difficulty walking, decreased ROM, decreased strength, decreased safety awareness, impaired UE functional use, improper body mechanics, obesity, and pain.   ACTIVITY LIMITATIONS carrying, lifting, bending, sitting, standing, squatting, stairs, transfers, and locomotion level  PARTICIPATION LIMITATIONS: meal prep, cleaning, laundry, shopping, and community activity  PERSONAL FACTORS Age, Behavior pattern, Time since onset of injury/illness/exacerbation, and 1-2 comorbidities: obesity, Chiari malformation, heart murmur   are also affecting patient's functional outcome.   REHAB POTENTIAL: Fair    CLINICAL DECISION MAKING:  Evolving/moderate complexity  EVALUATION COMPLEXITY: Low   GOALS: Goals reviewed with patient? Yes  SHORT TERM GOALS: Target date: 11/03/21  Patient will be independent with initial HEP. Goal status: INITIAL   LONG TERM GOALS: Target date: 12/08/21  Patient will be independent with advanced/ongoing HEP to improve outcomes and carryover.  Goal status: INITIAL  2.  Patient will report at least 75% improvement in L knee pain to improve QOL. Baseline: 7/10 Goal status: ongoing  3.  Patient will demonstrate improved functional LE strength as demonstrated by 4/5 in BLE. Baseline: 3+/5 Goal status: ongoing  5.  Patient will be able to ambulate 300' with LRAD and normal gait pattern without increased pain to access community.  Baseline: walking w/SPC Goal status: INITIAL  6. Patient will be able to ascend/descend stairs with 1 HR and reciprocal step pattern safely to access home and community.  Goal status: INITIAL  7.  Patient will report 10 on FOTO (patient reported outcome measure) to demonstrate improved functional ability. Baseline: 38 Goal status: INITIAL   PLAN: PT FREQUENCY: 1-2x/week  PT DURATION: 10 weeks  PLANNED INTERVENTIONS: Therapeutic exercises, Therapeutic activity, Neuromuscular re-education, Balance training, Gait training, Patient/Family education, Self Care, Joint mobilization, Stair training, Electrical stimulation, Cryotherapy, Moist heat, Taping, Vasopneumatic device, Traction, Ionotophoresis '4mg'$ /ml Dexamethasone, and Manual therapy  PLAN FOR NEXT SESSION:   10th visit progress note due next land session:  low level LE strengthening, gait training, Nustep, balance     Guido Sander, PT 11/08/2021, 1:08 PM  [facility] [address], [place], [place] [address] [phone]

## 2021-11-10 ENCOUNTER — Ambulatory Visit: Payer: Medicare Other

## 2021-11-11 ENCOUNTER — Ambulatory Visit: Payer: Medicare Other | Admitting: Physical Therapy

## 2021-11-11 DIAGNOSIS — R2689 Other abnormalities of gait and mobility: Secondary | ICD-10-CM

## 2021-11-11 DIAGNOSIS — R279 Unspecified lack of coordination: Secondary | ICD-10-CM | POA: Diagnosis not present

## 2021-11-11 DIAGNOSIS — G8929 Other chronic pain: Secondary | ICD-10-CM | POA: Diagnosis not present

## 2021-11-11 DIAGNOSIS — M6281 Muscle weakness (generalized): Secondary | ICD-10-CM

## 2021-11-11 DIAGNOSIS — M25562 Pain in left knee: Secondary | ICD-10-CM | POA: Diagnosis not present

## 2021-11-11 NOTE — Therapy (Signed)
OUTPATIENT PHYSICAL THERAPY LOWER EXTREMITY Treatment  Progress Note Reporting Period 9//23 to 11/11/21   See note below for Objective Data and Assessment of Progress/Goals.      Patient Name: Bridget Cortez MRN: [id number] DOB:[birth year], 76 y.o., female Today's Date: 11/11/2021   PT End of Session - 11/11/21 1055     Visit Number 10    Date for PT Re-Evaluation 12/22/21    Authorization Type Medicare    PT Start Time 1055    PT Stop Time 1140    PT Time Calculation (min) 45 min               Past Medical History:  Diagnosis Date   Abnormal Pap smear 2006   vaginal medicine according to patient   Allergy    Arthritis    Asthma    Atrophy of vagina 06/2005   Breast cancer ([place]) 1999   Left   CAP (community acquired pneumonia) 08/06/2014   Cataract    cataracts lt. eye    cataract removed.   Chiari malformation    Diverticulosis    Dysrhythmia    SVT   GERD (gastroesophageal reflux disease)    H/O osteopenia    08/2006   H/O varicella    Heart murmur    Hypertension    Monilial vaginitis 07/2007   Multiple allergies    Ovarian cyst    Personal history of colonic adenomas 03/13/2012   Personal history of radiation therapy 1990   Pneumonia 07/2014   VIN III (vulvar intraepithelial neoplasia III) 03/2009   Yeast infection    Past Surgical History:  Procedure Laterality Date   ABDOMINAL HYSTERECTOMY     ABDOMINAL SURGERY     BREAST EXCISIONAL BIOPSY Right    benign   BREAST LUMPECTOMY Left 1990   BREAST SURGERY     Lumpectomy, left breast   CARDIOVERSION N/A 07/28/2020   Procedure: CARDIOVERSION;  Surgeon: Donato Heinz, MD;  Location: [facility];  Service: Cardiovascular;  Laterality: N/A;   CATARACT EXTRACTION Left 2018   COLONOSCOPY     COLONOSCOPY W/ BIOPSIES     EYE SURGERY     HERNIA REPAIR     KNEE SURGERY     right   lt. eye aneurysm surgery Left 2017   MOUTH SURGERY  2019   rt. side gum surgery  Lt. done 3 years ago    RADIOACTIVE SEED GUIDED EXCISIONAL BREAST BIOPSY Right 07/09/2018   Procedure: RADIOACTIVE SEED GUIDED EXCISIONAL RIGHT  BREAST BIOPSY;  Surgeon: Stark Klein, MD;  Location: [facility];  Service: General;  Laterality: Right;   TOTAL KNEE ARTHROPLASTY Right 09/09/2019   Procedure: RIGHT TOTAL KNEE ARTHROPLASTY;  Surgeon: Melrose Nakayama, MD;  Location: [facility];  Service: Orthopedics;  Laterality: Right;   Patient Active Problem List   Diagnosis Date Noted   Leukocytopenia 09/15/2020   Deficiency anemia 09/15/2020   Persistent atrial fibrillation (HCC)    Primary osteoarthritis of right knee 09/09/2019   (HFpEF) heart failure with preserved ejection fraction ([place]) 04/13/2019   SVT (supraventricular tachycardia) 08/04/2017   Wide-complex tachycardia 08/03/2017   Asthma 08/06/2014   Anxiety about health 08/06/2014   Essential hypertension 08/06/2014   Personal history of colonic adenomas 03/20/2012   Chiari malformation    Heart murmur    H/O varicella    Yeast infection    Cancer ([place])    H/O osteopenia    Abnormal Pap smear    Vulvar intraepithelial neoplasia III (VIN  III) 04/12/2009   Atrophy of vagina 06/23/2005    PCP: [name]  REFERRING PROVIDER: Melrose Nakayama  REFERRING DIAG: L knee pain sp R TKA in 2021  THERAPY DIAG:  Other abnormalities of gait and mobility  Muscle weakness (generalized)  Chronic pain of left knee  Rationale for Evaluation and Treatment Rehabilitation  ONSET DATE: 09/13/21  SUBJECTIVE:   SUBJECTIVE STATEMENT: RT knee funny yesterday and now its the left. Therapy is really helping, esp the water. When I leave here I am sore but when I leave the water I feel great   PERTINENT HISTORY: R TKA, heart failure, edema in ankles, HTN, Chiari malformation   PAIN:  Are you having pain? Yes: NPRS scale: 6-7/10 Pain location: L knee Pain description: dull, achy  Aggravating factors: standing and walking for too long  Relieving factors: rubbing  it with alcohol, Voltaren   PRECAUTIONS: Fall  WEIGHT BEARING RESTRICTIONS No  FALLS:  Has patient fallen in last 6 months? Yes. Number of falls 1  LIVING ENVIRONMENT: Lives with: lives with their spouse Lives in: House/apartment Stairs: Yes: External: 3 steps; on right going up Has following equipment at home: Single point cane and Walker - 2 wheeled  OCCUPATION: retired  PLOF: Independent  PATIENT GOALS to be able to stand and fix my posture    OBJECTIVE:   PATIENT SURVEYS:  FOTO 38/100  COGNITION:  Overall cognitive status: Within functional limits for tasks assessed     SENSATION: WFL   POSTURE: rounded shoulders, forward head, increased thoracic kyphosis, and flexed trunk    LOWER EXTREMITY ROM:  WFL    LOWER EXTREMITY MMT:  MMT Right eval Left eval R/L 11/11/21  Hip flexion 3+ 3+ 4/4  Hip extension     Hip abduction 4- 4- 4/4  Hip adduction 4- 4- 4/4  Hip internal rotation     Hip external rotation     Knee flexion 3+ 3+ 4+/4+  Knee extension 3+ 3+ 4/4- with pain  Ankle dorsiflexion     Ankle plantarflexion     Ankle inversion     Ankle eversion      (Blank rows = not tested)   FUNCTIONAL TESTS:  5 times sit to stand: 11/11/21 32.54 sec from mat without UE Timed up and go (TUG): 45.53s  11/11/21 35.67 with SPC  GAIT: Distance walked: in clinic distances Assistive device utilized: Single point cane Level of assistance: Modified independence Comments: slowed gait, unsteady, decreased step length, decreased arm swing    TODAY'S TREATMENT:  11/11/21 Nustep L 5 50mn LE only Green tband HS curl 2 sets STS with wt ball chest press to encourage upright trunk 2 sets 5 3# LAQ 2 sets 10 3# hip flex and abd seated stepping up onto 6 inch box 10x BIL Green tband clams 10 x BIL , 10 x SL Green tband hip flex 2 sets 10   10/27/21 NuStep L4 x647ms  LAQ 1.5# 2x10  HS curls redTB 2x10  Seated clamshells redTB 2x10  Heel taps 4" holding on  to rail 20 reps   10/20/21 NuStep L3 x 6 minutes Seated long kicks, no weight 2x10 reps each Sit to stand from elevated mat 2x5 reps Hamstring curls red x10 Standing chest press and shoulder flex holding cane in BUE 5 reps each-reported L knee felt slightly unstable. Knee add ball squeeze   10/14/21 NuStep L3 x 6 minutes Unable to tolerate Supine Seated long kicks, no weight due to sore knee  after last treatment 10 reps each Seated lateral trunk flexion, touching elbow to  mat on each side 10 reps each Lat lean to R lift LLE up to the side to the top of mat x 5, repeat with RLE Sit to stand from elevated mat with BUE on the back of chair turned backwards, 5 reps Practiced sit to stand from regular chair with armrests, emphasizing forward lean-6  reps Standing chest press and sB shoulder flex holding cane in BUE 5 reps each-reported L knee felt slightly unstable.   09/1821 NuStep L2 x 6 min LE only Red tband HS curls 2x10 LAQ 2lb 2x10  Seated March 2lb 2x10 Ball b/n knees squeeze Seated toe rasises and heel raises   10/04/21 Nustep level 3 x 6 minutes LE's only Red tband HS curls LAQ's 2x10 Ball b/n knees squeeze Red tband clamshells in sitting Seated toe rasises and heel raises Education on the pool program    PATIENT EDUCATION:  Education details: POC Person educated: Patient Education method: Explanation Education comprehension: verbalized understanding   HOME EXERCISE PROGRAM: TBD  ASSESSMENT:  CLINICAL IMPRESSION: pt arrived with 7/10 pain. States sore after land therapy but feels great after water therapy. Pt amb slowly with SPC ,fwd flexed and antalgic. STS, TUG, MMT and goals assessed see in note for specifics   OBJECTIVE IMPAIRMENTS Abnormal gait, decreased balance, decreased mobility, difficulty walking, decreased ROM, decreased strength, decreased safety awareness, impaired UE functional use, improper body mechanics, obesity, and pain.   ACTIVITY  LIMITATIONS carrying, lifting, bending, sitting, standing, squatting, stairs, transfers, and locomotion level  PARTICIPATION LIMITATIONS: meal prep, cleaning, laundry, shopping, and community activity  PERSONAL FACTORS Age, Behavior pattern, Time since onset of injury/illness/exacerbation, and 1-2 comorbidities: obesity, Chiari malformation, heart murmur   are also affecting patient's functional outcome.   REHAB POTENTIAL: Fair    CLINICAL DECISION MAKING: Evolving/moderate complexity  EVALUATION COMPLEXITY: Low   GOALS: Goals reviewed with patient? Yes  SHORT TERM GOALS: Target date: 11/03/21  Patient will be independent with initial HEP. Goal status: met 11/11/21   LONG TERM GOALS: Target date: 12/08/21  Patient will be independent with advanced/ongoing HEP to improve outcomes and carryover.  Goal status: INITIAL  2.  Patient will report at least 75% improvement in L knee pain to improve QOL. Baseline: 7/10 Goal status: ongoing 11/11/21  3.  Patient will demonstrate improved functional LE strength as demonstrated by 4/5 in BLE. Baseline: 3+/5 Goal status: partially met 11/11/21  5.  Patient will be able to ambulate 300' with LRAD and normal gait pattern without increased pain to access community.  Baseline: walking w/SPC Goal status: on going- fwd flexed with SPC and very antalgic 11/11/21  6. Patient will be able to ascend/descend stairs with 1 HR and reciprocal step pattern safely to access home and community.  Goal status: INITIAL  7.  Patient will report 90 on FOTO (patient reported outcome measure) to demonstrate improved functional ability. Baseline: 38 Goal status: INITIAL   PLAN: PT FREQUENCY: 1-2x/week  PT DURATION: 10 weeks  PLANNED INTERVENTIONS: Therapeutic exercises, Therapeutic activity, Neuromuscular re-education, Balance training, Gait training, Patient/Family education, Self Care, Joint mobilization, Stair training, Electrical stimulation,  Cryotherapy, Moist heat, Taping, Vasopneumatic device, Traction, Ionotophoresis 32m/ml Dexamethasone, and Manual therapy  PLAN FOR NEXT SESSION: progress strength and func. Pt states she is motivated to get stronger , stand up straighter and be more independent. Pt would like to work on floor transfers  AFederal-MogulPTA 11/11/2021, 10:56 AM  [address] [place], [place], [address] Phone: [phone]   Fax:  [phone]  Patient Details  Name: Gyselle Matthew MRN: [id number] Date of Birth: 07-16-1945 Referring Provider:  [name], MD  Encounter Date: 11/11/2021   Laqueta Carina, PTA 11/11/2021, 10:56 AM  [address] [place], [place], [address] Phone: [phone]   Fax:  [phone]

## 2021-11-14 ENCOUNTER — Ambulatory Visit: Payer: Medicare Other

## 2021-11-14 DIAGNOSIS — G8929 Other chronic pain: Secondary | ICD-10-CM

## 2021-11-14 DIAGNOSIS — R279 Unspecified lack of coordination: Secondary | ICD-10-CM | POA: Diagnosis not present

## 2021-11-14 DIAGNOSIS — R2689 Other abnormalities of gait and mobility: Secondary | ICD-10-CM

## 2021-11-14 DIAGNOSIS — M25562 Pain in left knee: Secondary | ICD-10-CM | POA: Diagnosis not present

## 2021-11-14 DIAGNOSIS — M6281 Muscle weakness (generalized): Secondary | ICD-10-CM

## 2021-11-14 NOTE — Therapy (Signed)
OUTPATIENT PHYSICAL THERAPY LOWER EXTREMITY Treatment    Patient Name: Bridget Cortez MRN: [id number] DOB:19-Aug-1945, 76 y.o., female Today's Date: 11/14/2021       Past Medical History:  Diagnosis Date   Abnormal Pap smear 2006   vaginal medicine according to patient   Allergy    Arthritis    Asthma    Atrophy of vagina 06/2005   Breast cancer ([place]) 1999   Left   CAP (community acquired pneumonia) 08/06/2014   Cataract    cataracts lt. eye    cataract removed.   Chiari malformation    Diverticulosis    Dysrhythmia    SVT   GERD (gastroesophageal reflux disease)    H/O osteopenia    08/2006   H/O varicella    Heart murmur    Hypertension    Monilial vaginitis 07/2007   Multiple allergies    Ovarian cyst    Personal history of colonic adenomas 03/13/2012   Personal history of radiation therapy 1990   Pneumonia 07/2014   VIN III (vulvar intraepithelial neoplasia III) 03/2009   Yeast infection    Past Surgical History:  Procedure Laterality Date   ABDOMINAL HYSTERECTOMY     ABDOMINAL SURGERY     BREAST EXCISIONAL BIOPSY Right    benign   BREAST LUMPECTOMY Left 1990   BREAST SURGERY     Lumpectomy, left breast   CARDIOVERSION N/A 07/28/2020   Procedure: CARDIOVERSION;  Surgeon: Donato Heinz, MD;  Location: [facility];  Service: Cardiovascular;  Laterality: N/A;   CATARACT EXTRACTION Left 2018   COLONOSCOPY     COLONOSCOPY W/ BIOPSIES     EYE SURGERY     HERNIA REPAIR     KNEE SURGERY     right   lt. eye aneurysm surgery Left 2017   MOUTH SURGERY  2019   rt. side gum surgery  Lt. done 3 years ago   RADIOACTIVE SEED GUIDED EXCISIONAL BREAST BIOPSY Right 07/09/2018   Procedure: RADIOACTIVE SEED GUIDED EXCISIONAL RIGHT  BREAST BIOPSY;  Surgeon: Stark Klein, MD;  Location: [facility];  Service: General;  Laterality: Right;   TOTAL KNEE ARTHROPLASTY Right 09/09/2019   Procedure: RIGHT TOTAL KNEE ARTHROPLASTY;  Surgeon: Melrose Nakayama, MD;  Location:  [facility];  Service: Orthopedics;  Laterality: Right;   Patient Active Problem List   Diagnosis Date Noted   Leukocytopenia 09/15/2020   Deficiency anemia 09/15/2020   Persistent atrial fibrillation (HCC)    Primary osteoarthritis of right knee 09/09/2019   (HFpEF) heart failure with preserved ejection fraction ([place]) 04/13/2019   SVT (supraventricular tachycardia) 08/04/2017   Wide-complex tachycardia 08/03/2017   Asthma 08/06/2014   Anxiety about health 08/06/2014   Essential hypertension 08/06/2014   Personal history of colonic adenomas 03/20/2012   Chiari malformation    Heart murmur    H/O varicella    Yeast infection    Cancer ([place])    H/O osteopenia    Abnormal Pap smear    Vulvar intraepithelial neoplasia III (VIN III) 04/12/2009   Atrophy of vagina 06/23/2005    PCP: Steva Ready Avva  REFERRING PROVIDER: Melrose Nakayama  REFERRING DIAG: L knee pain sp R TKA in 2021  THERAPY DIAG:  No diagnosis found.  Rationale for Evaluation and Treatment Rehabilitation  ONSET DATE: 09/13/21  SUBJECTIVE:   SUBJECTIVE STATEMENT: I am fine but my R ankle is hurting.    PERTINENT HISTORY: R TKA, heart failure, edema in ankles, HTN, Chiari malformation   PAIN:  Are  you having pain? Yes: NPRS scale: 6-7/10 Pain location: L knee Pain description: dull, achy  Aggravating factors: standing and walking for too long  Relieving factors: rubbing it with alcohol, Voltaren   PRECAUTIONS: Fall  WEIGHT BEARING RESTRICTIONS No  FALLS:  Has patient fallen in last 6 months? Yes. Number of falls 1  LIVING ENVIRONMENT: Lives with: lives with their spouse Lives in: House/apartment Stairs: Yes: External: 3 steps; on right going up Has following equipment at home: Single point cane and Walker - 2 wheeled  OCCUPATION: retired  PLOF: Independent  PATIENT GOALS to be able to stand and fix my posture    OBJECTIVE:   PATIENT SURVEYS:  FOTO 38/100  COGNITION:  Overall cognitive  status: Within functional limits for tasks assessed     SENSATION: WFL   POSTURE: rounded shoulders, forward head, increased thoracic kyphosis, and flexed trunk    LOWER EXTREMITY ROM:  WFL    LOWER EXTREMITY MMT:  MMT Right eval Left eval R/L 11/11/21  Hip flexion 3+ 3+ 4/4  Hip extension     Hip abduction 4- 4- 4/4  Hip adduction 4- 4- 4/4  Hip internal rotation     Hip external rotation     Knee flexion 3+ 3+ 4+/4+  Knee extension 3+ 3+ 4/4- with pain  Ankle dorsiflexion     Ankle plantarflexion     Ankle inversion     Ankle eversion      (Blank rows = not tested)   FUNCTIONAL TESTS:  5 times sit to stand: 11/11/21 32.54 sec from mat without UE Timed up and go (TUG): 45.53s  11/11/21 35.67 with SPC  GAIT: Distance walked: in clinic distances Assistive device utilized: Single point cane Level of assistance: Modified independence Comments: slowed gait, unsteady, decreased step length, decreased arm swing    TODAY'S TREATMENT: 11/14/21 LAQ 3# 2x10 Marching 3# 2x10 Standing hip abd 3# 3x5 Seated clamshells greenTB 2x10 S2S 2x5, second set with red ball push out  Ball squeezes 2x10    11/11/21 Nustep L 5 66mn LE only Green tband HS curl 2 sets STS with wt ball chest press to encourage upright trunk 2 sets 5 3# LAQ 2 sets 10 3# hip flex and abd seated stepping up onto 6 inch box 10x BIL Green tband clams 10 x BIL , 10 x SL Green tband hip flex 2 sets 10   10/27/21 NuStep L4 x659ms  LAQ 1.5# 2x10  HS curls redTB 2x10  Seated clamshells redTB 2x10  Heel taps 4" holding on to rail 20 reps   10/20/21 NuStep L3 x 6 minutes Seated long kicks, no weight 2x10 reps each Sit to stand from elevated mat 2x5 reps Hamstring curls red x10 Standing chest press and shoulder flex holding cane in BUE 5 reps each-reported L knee felt slightly unstable. Knee add ball squeeze   10/14/21 NuStep L3 x 6 minutes Unable to tolerate Supine Seated long kicks, no weight  due to sore knee after last treatment 10 reps each Seated lateral trunk flexion, touching elbow to  mat on each side 10 reps each Lat lean to R lift LLE up to the side to the top of mat x 5, repeat with RLE Sit to stand from elevated mat with BUE on the back of chair turned backwards, 5 reps Practiced sit to stand from regular chair with armrests, emphasizing forward lean-6  reps Standing chest press and sB shoulder flex holding cane in BUE 5 reps each-reported L  knee felt slightly unstable.   09/1821 NuStep L2 x 6 min LE only Red tband HS curls 2x10 LAQ 2lb 2x10  Seated March 2lb 2x10 Ball b/n knees squeeze Seated toe rasises and heel raises   10/04/21 Nustep level 3 x 6 minutes LE's only Red tband HS curls LAQ's 2x10 Ball b/n knees squeeze Red tband clamshells in sitting Seated toe rasises and heel raises Education on the pool program    PATIENT EDUCATION:  Education details: POC Person educated: Patient Education method: Explanation Education comprehension: verbalized understanding   HOME EXERCISE PROGRAM: TBD  ASSESSMENT:  CLINICAL IMPRESSION: Patient arrives with some R ankle pain. We tried to get on the Nustep but patient caught a big cramp in her R hip and was unable to continue. Patient has trouble with standing exercises today, easily fatigues and needs breaks often.    OBJECTIVE IMPAIRMENTS Abnormal gait, decreased balance, decreased mobility, difficulty walking, decreased ROM, decreased strength, decreased safety awareness, impaired UE functional use, improper body mechanics, obesity, and pain.   ACTIVITY LIMITATIONS carrying, lifting, bending, sitting, standing, squatting, stairs, transfers, and locomotion level  PARTICIPATION LIMITATIONS: meal prep, cleaning, laundry, shopping, and community activity  PERSONAL FACTORS Age, Behavior pattern, Time since onset of injury/illness/exacerbation, and 1-2 comorbidities: obesity, Chiari malformation, heart murmur   are also affecting patient's functional outcome.   REHAB POTENTIAL: Fair   CLINICAL DECISION MAKING: Evolving/moderate complexity  EVALUATION COMPLEXITY: Low   GOALS: Goals reviewed with patient? Yes  SHORT TERM GOALS: Target date: 11/03/21  Patient will be independent with initial HEP. Goal status: met 11/11/21   LONG TERM GOALS: Target date: 12/08/21  Patient will be independent with advanced/ongoing HEP to improve outcomes and carryover.  Goal status: INITIAL  2.  Patient will report at least 75% improvement in L knee pain to improve QOL. Baseline: 7/10 Goal status: ongoing 11/11/21  3.  Patient will demonstrate improved functional LE strength as demonstrated by 4/5 in BLE. Baseline: 3+/5 Goal status: partially met 11/11/21  5.  Patient will be able to ambulate 300' with LRAD and normal gait pattern without increased pain to access community.  Baseline: walking w/SPC Goal status: on going- fwd flexed with SPC and very antalgic 11/11/21  6. Patient will be able to ascend/descend stairs with 1 HR and reciprocal step pattern safely to access home and community.  Goal status: INITIAL  7.  Patient will report 44 on FOTO (patient reported outcome measure) to demonstrate improved functional ability. Baseline: 38 Goal status: INITIAL   PLAN: PT FREQUENCY: 1-2x/week  PT DURATION: 10 weeks  PLANNED INTERVENTIONS: Therapeutic exercises, Therapeutic activity, Neuromuscular re-education, Balance training, Gait training, Patient/Family education, Self Care, Joint mobilization, Stair training, Electrical stimulation, Cryotherapy, Moist heat, Taping, Vasopneumatic device, Traction, Ionotophoresis 102m/ml Dexamethasone, and Manual therapy  PLAN FOR NEXT SESSION: progress strength and func. Pt states she is motivated to get stronger , stand up straighter and be more independent. Pt would like to work on floor transfers  AFranki MontePTA 11/14/2021, 9:35 AM [place] [address]Phone: [phone]  Fax:  [phone] Patient Details  Name: FNainika NewlunMRN: [id number] of Birth: [birth year]Referring Provider:  APrince Solian MD  Encounter Date: 11/14/2021   MAndris Baumann PT 11/14/2021, 9:35 AM  [place] [address]Phone: [phone]  Fax:  [phone]

## 2021-11-16 ENCOUNTER — Ambulatory Visit: Payer: Medicare Other | Admitting: Physical Therapy

## 2021-11-16 DIAGNOSIS — G8929 Other chronic pain: Secondary | ICD-10-CM | POA: Diagnosis not present

## 2021-11-16 DIAGNOSIS — R279 Unspecified lack of coordination: Secondary | ICD-10-CM | POA: Diagnosis not present

## 2021-11-16 DIAGNOSIS — M6281 Muscle weakness (generalized): Secondary | ICD-10-CM

## 2021-11-16 DIAGNOSIS — M25562 Pain in left knee: Secondary | ICD-10-CM | POA: Diagnosis not present

## 2021-11-16 DIAGNOSIS — R2689 Other abnormalities of gait and mobility: Secondary | ICD-10-CM | POA: Diagnosis not present

## 2021-11-17 ENCOUNTER — Encounter: Payer: Self-pay | Admitting: Physical Therapy

## 2021-11-17 NOTE — Therapy (Signed)
OUTPATIENT PHYSICAL THERAPY LOWER EXTREMITY TREATMENT NOTE (AQUATIC THERAPY)   Patient Name: Bridget Cortez MRN: [id number] DOB:1945-09-28, 76 y.o., female Today's Date: 11/17/2021   PT End of Session - 11/17/21 1346     Visit Number 12    Date for PT Re-Evaluation 12/22/21    Authorization Type Medicare    PT Start Time 1350    PT Stop Time 1435    PT Time Calculation (min) 45 min    Equipment Utilized During Treatment Other (comment)   large bar bell   Activity Tolerance Patient tolerated treatment well    Behavior During Therapy [facility] for tasks assessed/performed              Past Medical History:  Diagnosis Date   Abnormal Pap smear 2006   vaginal medicine according to patient   Allergy    Arthritis    Asthma    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 eye    cataract removed.   Chiari malformation    Diverticulosis    Dysrhythmia    SVT   GERD (gastroesophageal reflux disease)    H/O osteopenia    08/2006   H/O varicella    Heart murmur    Hypertension    Monilial vaginitis 07/2007   Multiple allergies    Ovarian cyst    Personal history of colonic adenomas 03/13/2012   Personal history of radiation therapy 1990   Pneumonia 07/2014   VIN III (vulvar intraepithelial neoplasia III) 03/2009   Yeast infection    Past Surgical History:  Procedure Laterality Date   ABDOMINAL HYSTERECTOMY     ABDOMINAL SURGERY     BREAST EXCISIONAL BIOPSY Right    benign   BREAST LUMPECTOMY Left 1990   BREAST SURGERY     Lumpectomy, left breast   CARDIOVERSION N/A 07/28/2020   Procedure: CARDIOVERSION;  Surgeon: Donato Heinz, MD;  Location: [place];  Service: Cardiovascular;  Laterality: N/A;   CATARACT EXTRACTION Left 2018   COLONOSCOPY     COLONOSCOPY W/ BIOPSIES     EYE SURGERY     HERNIA REPAIR     KNEE SURGERY     right   lt. eye aneurysm surgery  Left 2017   MOUTH SURGERY  2019   rt. side gum surgery  Lt. done 3 years ago   RADIOACTIVE SEED GUIDED EXCISIONAL BREAST BIOPSY Right 07/09/2018   Procedure: RADIOACTIVE SEED GUIDED EXCISIONAL RIGHT  BREAST BIOPSY;  Surgeon: Stark Klein, MD;  Location: [place];  Service: General;  Laterality: Right;   TOTAL KNEE ARTHROPLASTY Right 09/09/2019   Procedure: RIGHT TOTAL KNEE ARTHROPLASTY;  Surgeon: Melrose Nakayama, MD;  Location: [facility];  Service: Orthopedics;  Laterality: Right;   Patient Active Problem List   Diagnosis Date Noted   Leukocytopenia 09/15/2020   Deficiency anemia 09/15/2020   Persistent atrial fibrillation (HCC)    Primary osteoarthritis of right knee 09/09/2019   (HFpEF) heart failure with preserved ejection fraction ([place]) 04/13/2019   SVT (supraventricular tachycardia) 08/04/2017   Wide-complex tachycardia 08/03/2017   Asthma 08/06/2014   Anxiety about health 08/06/2014   Essential hypertension 08/06/2014   Personal history of colonic adenomas 03/20/2012   Chiari malformation    Heart murmur    H/O varicella    Yeast infection    Cancer ([place])    H/O osteopenia  Abnormal Pap smear    Vulvar intraepithelial neoplasia III (VIN III) 04/12/2009   Atrophy of vagina 06/23/2005    PCP: [name]  REFERRING PROVIDER: Melrose Nakayama  REFERRING DIAG: L knee pain sp R TKA in 2021  THERAPY DIAG:  Other abnormalities of gait and mobility  Muscle weakness (generalized)  Chronic pain of left knee  Rationale for Evaluation and Treatment Rehabilitation  ONSET DATE: 09/13/21  SUBJECTIVE:   SUBJECTIVE STATEMENT: Pt reports she had a severe cramp in her Rt thigh after PT land therapy on Monday this week - states she yelled for her husband to assist her and placed ice on it; pt reports "that hasn't happened since right after surgery"  PERTINENT HISTORY: R TKA, heart failure, edema in ankles, HTN, Chiari malformation   PAIN:  Are you having pain? Yes: NPRS scale:  3/10 Pain location: Rt knee Pain description: dull, achy  Aggravating factors: standing and walking for too long  :  fall sustained on evening of 10-23-21 caused the pain in Rt knee  Relieving factors: rubbing it with alcohol, Voltaren   PRECAUTIONS: Fall  WEIGHT BEARING RESTRICTIONS No  FALLS:  Has patient fallen in last 6 months? Yes. Number of falls 1  LIVING ENVIRONMENT: Lives with: lives with their spouse Lives in: House/apartment Stairs: Yes: External: 3 steps; on right going up Has following equipment at home: Single point cane and Walker - 2 wheeled  OCCUPATION: retired  PLOF: Independent  PATIENT GOALS to be able to stand and fix my posture    OBJECTIVE:    11-16-21:  Aquatic therapy at [place] 90 degrees; pt using SPC  for assistance with amb. But used facility's wheelchair to access pool area with assistance from [facility] staff from front desk (approx. 150') due to prolonged distance  Patient seen for aquatic therapy today.  Treatment took place in water 3.6-4.0 feet deep depending upon activity.  Pt entered and exited  the pool via step negotiation with use of bilateral hand rails using step by step sequence with CGA.  Marching in place with UE support on pool edge 10 reps at start of session for warm up  Pt used large single bar bell for UE support with min assist for stabilization and for increased confidence and security  Pt performed water walking 18' x approx. 6 reps across pool with use of single bar bell with min assist for stabilization by PT  Sidestepping with UE support on large bar bell with mod assist for stabilization - 18' x 4 reps  Backwards amb. With UE support on large bar bell 18' x 2 reps with min to mod assist for stabilization of bar bell  Pt performed standing bil. LE strengthening exercises with bil. UE support on pool edge - standing hip flexion with knee extended 15 reps; standing hip abduction 15 reps each leg and , hip  extension with knee extended 15 reps each leg:  marching 15 reps each leg with bil. UE support on pool edge  Small range mini squats 10 reps at end of session - pt held onto edge of pool for bil. UE support    Pt requires buoyancy of water for support for reduced fall risk and also for joint offloading to decrease stress and strain on knees with weight bearing; buoyancy resisted exercises provide resistance as well as the viscosity of water providing resistance for strengthening; buoyancy assisted exercises provide increased active assisted ROM.  Water current provides perturbations for improved  static & dynamic standing balance and for improved trunk/core stabilization   PATIENT EDUCATION:  Education details: benefits of aquatic therapy Person educated: Patient Education method: Explanation Education comprehension: verbalized understanding   HOME EXERCISE PROGRAM: TBD  ASSESSMENT:  CLINICAL IMPRESSION: Aquatic therapy session focused on water walking in various directions and LE AROM/strengthening exercises using buoyancy of water for resistance with viscosity of water for strengthening.  Pt requiring less assistance for stabilization of bar bell as pt demonstrating increased balance, comfort, and decreased fear/anxiety with aquatic exercise.  Cont with POC.  OBJECTIVE IMPAIRMENTS Abnormal gait, decreased balance, decreased mobility, difficulty walking, decreased ROM, decreased strength, decreased safety awareness, impaired UE functional use, improper body mechanics, obesity, and pain.   ACTIVITY LIMITATIONS carrying, lifting, bending, sitting, standing, squatting, stairs, transfers, and locomotion level  PARTICIPATION LIMITATIONS: meal prep, cleaning, laundry, shopping, and community activity  PERSONAL FACTORS Age, Behavior pattern, Time since onset of injury/illness/exacerbation, and 1-2 comorbidities: obesity, Chiari malformation, heart murmur   are also affecting patient's  functional outcome.   REHAB POTENTIAL: Fair    CLINICAL DECISION MAKING: Evolving/moderate complexity  EVALUATION COMPLEXITY: Low   GOALS:  Goals reviewed with patient? Yes  SHORT TERM GOALS: Target date: 11/03/21  Patient will be independent with initial HEP. Goal status: INITIAL     LONG TERM GOALS: Target date: 12/08/21  Patient will be independent with advanced/ongoing HEP to improve outcomes and carryover.  Goal status: INITIAL  2.  Patient will report at least 75% improvement in L knee pain to improve QOL. Baseline: 7/10 Goal status: ongoing 11/11/21  3.  Patient will demonstrate improved functional LE strength as demonstrated by 4/5 in BLE. Baseline: 3+/5 Goal status: partially met 11/11/21  5.  Patient will be able to ambulate 300' with LRAD and normal gait pattern without increased pain to access community.  Baseline: walking w/SPC Goal status: on going- fwd flexed with SPC and very antalgic 11/11/21  6. Patient will be able to ascend/descend stairs with 1 HR and reciprocal step pattern safely to access home and community.  Goal status: INITIAL  7.  Patient will report 24 on FOTO (patient reported outcome measure) to demonstrate improved functional ability. Baseline: 38 Goal status: INITIAL   PLAN:  PT FREQUENCY: 1-2x/week  PT DURATION: 10 weeks  PLANNED INTERVENTIONS: Therapeutic exercises, Therapeutic activity, Neuromuscular re-education, Balance training, Gait training, Patient/Family education, Self Care, Joint mobilization, Stair training, Electrical stimulation, Cryotherapy, Moist heat, Taping, Vasopneumatic device, Traction, Ionotophoresis 46m/ml Dexamethasone, and Manual therapy  PLAN FOR NEXT SESSION:   10th visit progress note due next land session:  low level LE strengthening, gait training, Nustep, balance     SGuido Sander PT 11/17/2021, 1:50 PM  [id number], [place] [phone]

## 2021-11-21 ENCOUNTER — Encounter: Payer: Self-pay | Admitting: Physical Therapy

## 2021-11-21 ENCOUNTER — Ambulatory Visit: Payer: Medicare Other | Admitting: Physical Therapy

## 2021-11-21 DIAGNOSIS — M6281 Muscle weakness (generalized): Secondary | ICD-10-CM | POA: Diagnosis not present

## 2021-11-21 DIAGNOSIS — R279 Unspecified lack of coordination: Secondary | ICD-10-CM | POA: Diagnosis not present

## 2021-11-21 DIAGNOSIS — G8929 Other chronic pain: Secondary | ICD-10-CM

## 2021-11-21 DIAGNOSIS — R2689 Other abnormalities of gait and mobility: Secondary | ICD-10-CM

## 2021-11-21 DIAGNOSIS — M25562 Pain in left knee: Secondary | ICD-10-CM | POA: Diagnosis not present

## 2021-11-21 NOTE — Therapy (Signed)
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 eye    cataract removed.   Chiari malformation    Diverticulosis    Dysrhythmia    SVT   GERD (gastroesophageal reflux disease)    H/O osteopenia    08/2006   H/O varicella    Heart murmur    Hypertension    Monilial vaginitis 07/2007   Multiple allergies    Ovarian cyst    Personal history of colonic adenomas 03/13/2012   Personal history of radiation therapy 1990   Pneumonia 07/2014   VIN III (vulvar intraepithelial neoplasia III) 03/2009   Yeast infection    Past Surgical History:  Procedure Laterality Date   ABDOMINAL HYSTERECTOMY     ABDOMINAL SURGERY     BREAST EXCISIONAL BIOPSY Right    benign   BREAST LUMPECTOMY Left 1990   BREAST SURGERY     Lumpectomy, left breast   CARDIOVERSION N/A 07/28/2020   Procedure: CARDIOVERSION;  Surgeon: Donato Heinz, MD;  Location: [place];  Service: Cardiovascular;  Laterality: N/A;   CATARACT EXTRACTION Left 2018   COLONOSCOPY     COLONOSCOPY W/ BIOPSIES     EYE SURGERY     HERNIA REPAIR     KNEE SURGERY     right   lt. eye aneurysm surgery Left 2017   MOUTH SURGERY  2019   rt. side gum surgery  Lt. done 3 years ago   RADIOACTIVE SEED GUIDED EXCISIONAL BREAST BIOPSY Right 07/09/2018   Procedure: RADIOACTIVE SEED GUIDED EXCISIONAL RIGHT  BREAST BIOPSY;  Surgeon: Stark Klein, MD;  Location: [place];  Service: General;  Laterality: Right;   TOTAL KNEE ARTHROPLASTY Right 09/09/2019   Procedure: RIGHT TOTAL KNEE ARTHROPLASTY;  Surgeon: Melrose Nakayama, MD;  Location:  [facility];  Service: Orthopedics;  Laterality: Right;   Patient Active Problem List   Diagnosis Date Noted   Leukocytopenia 09/15/2020   Deficiency anemia 09/15/2020   Persistent atrial fibrillation (HCC)    Primary osteoarthritis of right knee 09/09/2019   (HFpEF) heart failure with preserved ejection fraction ([place]) 04/13/2019   SVT (supraventricular tachycardia) 08/04/2017   Wide-complex tachycardia 08/03/2017   Asthma 08/06/2014   Anxiety about health 08/06/2014   Essential hypertension 08/06/2014   Personal history of colonic adenomas 03/20/2012   Chiari malformation    Heart murmur    H/O varicella    Yeast infection    Cancer ([place])    H/O osteopenia    Abnormal Pap smear    Vulvar intraepithelial neoplasia III (VIN III) 04/12/2009   Atrophy of vagina 06/23/2005    PCP: Steva Ready Avva  REFERRING PROVIDER: Melrose Nakayama  REFERRING DIAG: L knee pain sp R TKA in 2021  THERAPY DIAG:  No diagnosis found.  Rationale for Evaluation and Treatment Rehabilitation  ONSET DATE: 09/13/21  SUBJECTIVE:   SUBJECTIVE STATEMENT: Patient reports improved pain today.   PERTINENT HISTORY: R TKA, heart failure, edema in ankles, HTN, Chiari malformation   PAIN:  Are you having pain? Yes: NPRS  scale: 6-7/10 Pain location: L knee Pain description: dull, achy  Aggravating factors: standing and walking for too long  Relieving factors: rubbing it with alcohol, Voltaren   PRECAUTIONS: Fall  WEIGHT BEARING RESTRICTIONS No  FALLS:  Has patient fallen in last 6 months? Yes. Number of falls 1  LIVING ENVIRONMENT: Lives with: lives with their spouse Lives in: House/apartment Stairs: Yes: External: 3 steps; on right going up Has following equipment at home: Single point cane and Walker - 2 wheeled  OCCUPATION: retired  PLOF: Independent  PATIENT GOALS to be able to stand and fix my posture    OBJECTIVE:   PATIENT SURVEYS:  FOTO 38/100  COGNITION:  Overall cognitive  status: Within functional limits for tasks assessed     SENSATION: WFL   POSTURE: rounded shoulders, forward head, increased thoracic kyphosis, and flexed trunk    LOWER EXTREMITY ROM:  WFL    LOWER EXTREMITY MMT:  MMT Right eval Left eval R/L 11/11/21  Hip flexion 3+ 3+ 4/4  Hip extension     Hip abduction 4- 4- 4/4  Hip adduction 4- 4- 4/4  Hip internal rotation     Hip external rotation     Knee flexion 3+ 3+ 4+/4+  Knee extension 3+ 3+ 4/4- with pain  Ankle dorsiflexion     Ankle plantarflexion     Ankle inversion     Ankle eversion      (Blank rows = not tested)   FUNCTIONAL TESTS:  5 times sit to stand: 11/11/21 32.54 sec from mat without UE Timed up and go (TUG): 45.53s  11/11/21 35.67 with SPC  GAIT: Distance walked: in clinic distances Assistive device utilized: Single point cane Level of assistance: Modified independence Comments: slowed gait, unsteady, decreased step length, decreased arm swing    TODAY'S TREATMENT: 11/21/21 NuStep L3 x 6 minutes Seated long kicks, no weight, 2 x 10 each leg Seated step taps on 4" step L,2" step, R 2 x 10 each leg Seated clamshells against G Tband, 2 x 10 reps Vaso to R knee for pain, 34 degrees, low pressure, 10 minutes.  11/14/21 LAQ 3# 2x10 Marching 3# 2x10 Standing hip abd 3# 3x5 Seated clamshells greenTB 2x10 S2S 2x5, second set with red ball push out  Ball squeezes 2x10    11/11/21 Nustep L 5 37mn LE only Green tband HS curl 2 sets STS with wt ball chest press to encourage upright trunk 2 sets 5 3# LAQ 2 sets 10 3# hip flex and abd seated stepping up onto 6 inch box 10x BIL Green tband clams 10 x BIL , 10 x SL Green tband hip flex 2 sets 10   10/27/21 NuStep L4 x678ms  LAQ 1.5# 2x10  HS curls redTB 2x10  Seated clamshells redTB 2x10  Heel taps 4" holding on to rail 20 reps   10/20/21 NuStep L3 x 6 minutes Seated long kicks, no weight 2x10 reps each Sit to stand from elevated mat 2x5  reps Hamstring curls red x10 Standing chest press and shoulder flex holding cane in BUE 5 reps each-reported L knee felt slightly unstable. Knee add ball squeeze   10/14/21 NuStep L3 x 6 minutes Unable to tolerate Supine Seated long kicks, no weight due to sore knee after last treatment 10 reps each Seated lateral trunk flexion, touching elbow to  mat on each side 10 reps each Lat lean to R lift LLE up to the side to the top of mat x 5, repeat with  RLE Sit to stand from elevated mat with BUE on the back of chair turned backwards, 5 reps Practiced sit to stand from regular chair with armrests, emphasizing forward lean-6  reps Standing chest press and sB shoulder flex holding cane in BUE 5 reps each-reported L knee felt slightly unstable.   09/1821 NuStep L2 x 6 min LE only Red tband HS curls 2x10 LAQ 2lb 2x10  Seated March 2lb 2x10 Ball b/n knees squeeze Seated toe rasises and heel raises   10/04/21 Nustep level 3 x 6 minutes LE's only Red tband HS curls LAQ's 2x10 Ball b/n knees squeeze Red tband clamshells in sitting Seated toe rasises and heel raises Education on the pool program    PATIENT EDUCATION:  Education details: POC Person educated: Patient Education method: Explanation Education comprehension: verbalized understanding   HOME EXERCISE PROGRAM: TBD  ASSESSMENT:  CLINICAL IMPRESSION: Patient initially reported improved pain in R knee, but as treatment progressed she reported increased pain again today, so treatment modified to avoid too much strain, and patient received VASO at end of treatment.   OBJECTIVE IMPAIRMENTS Abnormal gait, decreased balance, decreased mobility, difficulty walking, decreased ROM, decreased strength, decreased safety awareness, impaired UE functional use, improper body mechanics, obesity, and pain.   ACTIVITY LIMITATIONS carrying, lifting, bending, sitting, standing, squatting, stairs, transfers, and locomotion  level  PARTICIPATION LIMITATIONS: meal prep, cleaning, laundry, shopping, and community activity  PERSONAL FACTORS Age, Behavior pattern, Time since onset of injury/illness/exacerbation, and 1-2 comorbidities: obesity, Chiari malformation, heart murmur  are also affecting patient's functional outcome.   REHAB POTENTIAL: Fair   CLINICAL DECISION MAKING: Evolving/moderate complexity  EVALUATION COMPLEXITY: Low   GOALS: Goals reviewed with patient? Yes  SHORT TERM GOALS: Target date: 11/03/21  Patient will be independent with initial HEP. Goal status: met 11/11/21   LONG TERM GOALS: Target date: 12/08/21  Patient will be independent with advanced/ongoing HEP to improve outcomes and carryover.  Goal status: INITIAL  2.  Patient will report at least 75% improvement in L knee pain to improve QOL. Baseline: 7/10 Goal status: ongoing 11/11/21  3.  Patient will demonstrate improved functional LE strength as demonstrated by 4/5 in BLE. Baseline: 3+/5 Goal status: partially met 11/11/21  5.  Patient will be able to ambulate 300' with LRAD and normal gait pattern without increased pain to access community.  Baseline: walking w/SPC Goal status: on going- fwd flexed with SPC and very antalgic 11/11/21  6. Patient will be able to ascend/descend stairs with 1 HR and reciprocal step pattern safely to access home and community.  Goal status: INITIAL  7.  Patient will report 49 on FOTO (patient reported outcome measure) to demonstrate improved functional ability. Baseline: 38 Goal status: INITIAL   PLAN: PT FREQUENCY: 1-2x/week  PT DURATION: 10 weeks  PLANNED INTERVENTIONS: Therapeutic exercises, Therapeutic activity, Neuromuscular re-education, Balance training, Gait training, Patient/Family education, Self Care, Joint mobilization, Stair training, Electrical stimulation, Cryotherapy, Moist heat, Taping, Vasopneumatic device, Traction, Ionotophoresis 27m/ml Dexamethasone, and Manual  therapy  PLAN FOR NEXT SESSION: progress strength and func. Pt states she is motivated to get stronger , stand up straighter and be more independent. Pt would like to work on floor transfers  Patient Details  Name: FEriyana SweetenMRN: [id number] of Birth: [birth year]Referring Provider:  APrince Solian MD  Encounter Date: 11/14/2021   SEthel RanaDPT 11/21/21 2:34 PM  11/14/2021, 9:35 AM  [place]  [place] Phone: [phone]   Fax:  [phone]

## 2021-11-23 DIAGNOSIS — R058 Other specified cough: Secondary | ICD-10-CM | POA: Diagnosis not present

## 2021-11-23 DIAGNOSIS — J4521 Mild intermittent asthma with (acute) exacerbation: Secondary | ICD-10-CM | POA: Diagnosis not present

## 2021-11-23 DIAGNOSIS — Z1152 Encounter for screening for COVID-19: Secondary | ICD-10-CM | POA: Diagnosis not present

## 2021-11-24 ENCOUNTER — Other Ambulatory Visit: Payer: Self-pay | Admitting: Internal Medicine

## 2021-11-24 ENCOUNTER — Telehealth: Payer: Self-pay | Admitting: Physical Therapy

## 2021-11-24 DIAGNOSIS — H40023 Open angle with borderline findings, high risk, bilateral: Secondary | ICD-10-CM | POA: Diagnosis not present

## 2021-11-24 DIAGNOSIS — H04123 Dry eye syndrome of bilateral lacrimal glands: Secondary | ICD-10-CM | POA: Diagnosis not present

## 2021-11-24 DIAGNOSIS — Z1231 Encounter for screening mammogram for malignant neoplasm of breast: Secondary | ICD-10-CM

## 2021-11-24 NOTE — Telephone Encounter (Signed)
Would like to continue with aquatic therapy would like extension on referral.

## 2021-11-28 ENCOUNTER — Ambulatory Visit: Payer: Medicare Other | Attending: Orthopaedic Surgery | Admitting: Physical Therapy

## 2021-11-28 DIAGNOSIS — R279 Unspecified lack of coordination: Secondary | ICD-10-CM | POA: Insufficient documentation

## 2021-11-28 DIAGNOSIS — G8929 Other chronic pain: Secondary | ICD-10-CM | POA: Insufficient documentation

## 2021-11-28 DIAGNOSIS — M25562 Pain in left knee: Secondary | ICD-10-CM | POA: Insufficient documentation

## 2021-11-28 DIAGNOSIS — M6281 Muscle weakness (generalized): Secondary | ICD-10-CM | POA: Insufficient documentation

## 2021-11-28 DIAGNOSIS — R2689 Other abnormalities of gait and mobility: Secondary | ICD-10-CM | POA: Diagnosis not present

## 2021-11-28 NOTE — Therapy (Signed)
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 eye    cataract removed.   Chiari malformation    Diverticulosis    Dysrhythmia    SVT   GERD (gastroesophageal reflux disease)    H/O osteopenia    08/2006   H/O varicella    Heart murmur    Hypertension    Monilial vaginitis 07/2007   Multiple allergies    Ovarian cyst    Personal history of colonic adenomas 03/13/2012   Personal history of radiation therapy 1990   Pneumonia 07/2014   VIN III (vulvar intraepithelial neoplasia III) 03/2009   Yeast infection    Past Surgical History:  Procedure Laterality Date   ABDOMINAL HYSTERECTOMY     ABDOMINAL SURGERY     BREAST EXCISIONAL BIOPSY Right    benign   BREAST LUMPECTOMY Left 1990   BREAST SURGERY     Lumpectomy, left breast   CARDIOVERSION N/A 07/28/2020   Procedure: CARDIOVERSION;  Surgeon: Donato Heinz, MD;  Location: [facility];  Service: Cardiovascular;  Laterality: N/A;   CATARACT EXTRACTION Left 2018   COLONOSCOPY     COLONOSCOPY W/ BIOPSIES     EYE SURGERY     HERNIA REPAIR     KNEE SURGERY     right   lt. eye aneurysm surgery Left 2017   MOUTH SURGERY  2019   rt. side gum surgery  Lt. done 3 years ago   RADIOACTIVE SEED GUIDED EXCISIONAL BREAST BIOPSY Right 07/09/2018   Procedure: RADIOACTIVE SEED GUIDED EXCISIONAL RIGHT  BREAST BIOPSY;  Surgeon: Stark Klein, MD;  Location: [facility];  Service: General;  Laterality: Right;   TOTAL KNEE ARTHROPLASTY Right 09/09/2019   Procedure: RIGHT TOTAL KNEE ARTHROPLASTY;  Surgeon: Melrose Nakayama, MD;  Location:  [facility];  Service: Orthopedics;  Laterality: Right;   Patient Active Problem List   Diagnosis Date Noted   Leukocytopenia 09/15/2020   Deficiency anemia 09/15/2020   Persistent atrial fibrillation (HCC)    Primary osteoarthritis of right knee 09/09/2019   (HFpEF) heart failure with preserved ejection fraction ([place]) 04/13/2019   SVT (supraventricular tachycardia) 08/04/2017   Wide-complex tachycardia 08/03/2017   Asthma 08/06/2014   Anxiety about health 08/06/2014   Essential hypertension 08/06/2014   Personal history of colonic adenomas 03/20/2012   Chiari malformation    Heart murmur    H/O varicella    Yeast infection    Cancer ([place])    H/O osteopenia    Abnormal Pap smear    Vulvar intraepithelial neoplasia III (VIN III) 04/12/2009   Atrophy of vagina 06/23/2005    PCP: Steva Ready Avva  REFERRING PROVIDER: Melrose Nakayama  REFERRING DIAG: L knee pain sp R TKA in 2021  THERAPY DIAG:  No diagnosis found.  Rationale for Evaluation and Treatment Rehabilitation  ONSET DATE: 09/13/21  SUBJECTIVE:   SUBJECTIVE STATEMENT: Patient reports improved pain today.   PERTINENT HISTORY: R TKA, heart failure, edema in ankles, HTN, Chiari malformation   PAIN:  Are you having pain? Yes: NPRS  scale: 6-7/10 Pain location: L knee Pain description: dull, achy  Aggravating factors: standing and walking for too long  Relieving factors: rubbing it with alcohol, Voltaren   PRECAUTIONS: Fall  WEIGHT BEARING RESTRICTIONS No  FALLS:  Has patient fallen in last 6 months? Yes. Number of falls 1  LIVING ENVIRONMENT: Lives with: lives with their spouse Lives in: House/apartment Stairs: Yes: External: 3 steps; on right going up Has following equipment at home: Single point cane and Walker - 2 wheeled  OCCUPATION: retired  PLOF: Independent  PATIENT GOALS to be able to stand and fix my posture    OBJECTIVE:   PATIENT SURVEYS:  FOTO 38/100  COGNITION:  Overall cognitive  status: Within functional limits for tasks assessed     SENSATION: WFL   POSTURE: rounded shoulders, forward head, increased thoracic kyphosis, and flexed trunk    LOWER EXTREMITY ROM:  WFL    LOWER EXTREMITY MMT:  MMT Right eval Left eval R/L 11/11/21  Hip flexion 3+ 3+ 4/4  Hip extension     Hip abduction 4- 4- 4/4  Hip adduction 4- 4- 4/4  Hip internal rotation     Hip external rotation     Knee flexion 3+ 3+ 4+/4+  Knee extension 3+ 3+ 4/4- with pain  Ankle dorsiflexion     Ankle plantarflexion     Ankle inversion     Ankle eversion      (Blank rows = not tested)   FUNCTIONAL TESTS:  5 times sit to stand: 11/11/21 32.54 sec from mat without UE Timed up and go (TUG): 45.53s  11/11/21 35.67 with SPC  GAIT: Distance walked: in clinic distances Assistive device utilized: Single point cane Level of assistance: Modified independence Comments: slowed gait, unsteady, decreased step length, decreased arm swing    TODAY'S TREATMENT: 11/28/21 NuStep L4 x 6 minutes Re-assessment, see goals Mini squats x 10 Seated long kicks, flutter kick x 30 sec, heel rais/toe raise, 10 reps each Vaso to L knee x 10 minutes, low pressure, 34 degrees   11/21/21 NuStep L3 x 6 minutes Seated long kicks, no weight, 2 x 10 each leg Seated step taps on 4" step L,2" step, R 2 x 10 each leg Seated clamshells against G Tband, 2 x 10 reps Vaso to R knee for pain, 34 degrees, low pressure, 10 minutes.  11/14/21 LAQ 3# 2x10 Marching 3# 2x10 Standing hip abd 3# 3x5 Seated clamshells greenTB 2x10 S2S 2x5, second set with red ball push out  Ball squeezes 2x10    11/11/21 Nustep L 5 48mn LE only Green tband HS curl 2 sets STS with wt ball chest press to encourage upright trunk 2 sets 5 3# LAQ 2 sets 10 3# hip flex and abd seated stepping up onto 6 inch box 10x BIL Green tband clams 10 x BIL , 10 x SL Green tband hip flex 2 sets 10   10/27/21 NuStep L4 x637ms  LAQ 1.5# 2x10   HS curls redTB 2x10  Seated clamshells redTB 2x10  Heel taps 4" holding on to rail 20 reps   10/20/21 NuStep L3 x 6 minutes Seated long kicks, no weight 2x10 reps each Sit to stand from elevated mat 2x5 reps Hamstring curls red x10 Standing chest press and shoulder flex holding cane in BUE 5 reps each-reported L knee felt slightly unstable. Knee add ball squeeze   10/14/21 NuStep L3 x 6 minutes Unable to tolerate Supine Seated long kicks, no weight due to sore knee  after last treatment 10 reps each Seated lateral trunk flexion, touching elbow to  mat on each side 10 reps each Lat lean to R lift LLE up to the side to the top of mat x 5, repeat with RLE Sit to stand from elevated mat with BUE on the back of chair turned backwards, 5 reps Practiced sit to stand from regular chair with armrests, emphasizing forward lean-6  reps Standing chest press and sB shoulder flex holding cane in BUE 5 reps each-reported L knee felt slightly unstable.   09/1821 NuStep L2 x 6 min LE only Red tband HS curls 2x10 LAQ 2lb 2x10  Seated March 2lb 2x10 Ball b/n knees squeeze Seated toe rasises and heel raises   10/04/21 Nustep level 3 x 6 minutes LE's only Red tband HS curls LAQ's 2x10 Ball b/n knees squeeze Red tband clamshells in sitting Seated toe rasises and heel raises Education on the pool program    PATIENT EDUCATION:  Education details: POC Person educated: Patient Education method: Explanation Education comprehension: verbalized understanding   HOME EXERCISE PROGRAM:  [id number]  ASSESSMENT:  CLINICAL IMPRESSION: Patient reports that the VASO helped her R knee, but today her L knee hurts. Re-assessed status, which has not improved much. Patient feels like the therapy is helping. Therapist recommends following an HEP, with aquatic therapy 2x/week x 4 weeks, replacing her dry land therapy. Will re-assess in 4 weeks. Initiated HEP today, will practice x 1 week, then return for  re-assessment and update to HEP.   OBJECTIVE IMPAIRMENTS Abnormal gait, decreased balance, decreased mobility, difficulty walking, decreased ROM, decreased strength, decreased safety awareness, impaired UE functional use, improper body mechanics, obesity, and pain.   ACTIVITY LIMITATIONS carrying, lifting, bending, sitting, standing, squatting, stairs, transfers, and locomotion level  PARTICIPATION LIMITATIONS: meal prep, cleaning, laundry, shopping, and community activity  PERSONAL FACTORS Age, Behavior pattern, Time since onset of injury/illness/exacerbation, and 1-2 comorbidities: obesity, Chiari malformation, heart murmur  are also affecting patient's functional outcome.   REHAB POTENTIAL: Fair   CLINICAL DECISION MAKING: Evolving/moderate complexity  EVALUATION COMPLEXITY: Low   GOALS: Goals reviewed with patient? Yes  SHORT TERM GOALS: Target date: 11/03/21  Patient will be independent with initial HEP. Goal status: met 11/11/21   LONG TERM GOALS: Target date: 12/08/21  Patient will be independent with advanced/ongoing HEP to improve outcomes and carryover.  Goal status: ongoing  2.  Patient will report at least 75% improvement in L knee pain to improve QOL. Baseline: 7/10, 11/28/21 up to 8/10 Goal status: ongoing   3.  Patient will demonstrate improved functional LE strength as demonstrated by 4/5 in BLE. Baseline: 3+/5, 11/6-3+/5 Goal status: ongoing  5.  Patient will be able to ambulate 300' with LRAD and normal gait pattern without increased pain to access community.  Baseline: walking w/SPC, 11/6- Able to walk in from parking lot, but then fatigued, approximately 100' Goal status: on going- fwd flexed with SPC and very antalgic 11/11/21  6. Patient will be able to ascend/descend stairs with 1 HR and reciprocal step pattern safely to access home and community.  Goal status: Requires handrail  and step to gait pattern  7.  Patient will report 65 on FOTO (patient  reported outcome measure) to demonstrate improved functional ability. Baseline: 38, 11/6-37 Goal status: ongoing   PLAN: PT FREQUENCY: 1-2x/week  PT DURATION: 10 weeks  PLANNED INTERVENTIONS: Therapeutic exercises, Therapeutic activity, Neuromuscular re-education, Balance training, Gait training, Patient/Family education, Self Care, Joint mobilization, Stair training, Dealer  stimulation, Cryotherapy, Moist heat, Taping, Vasopneumatic device, Traction, Ionotophoresis 26m/ml Dexamethasone, and Manual therapy  PLAN FOR NEXT SESSION: Re-assess HEP. What did Dr DLatanya Maudlinsay?  Patient Details  Name: FAilyne PawleyMRN: [id number] of Birth: [birth year]Referring Provider:  APrince Solian MD  Encounter Date: 11/14/2021   SEthel RanaDPT 11/28/21 5:14 PM  11/14/2021, 9:35 AM  [place] [address]Phone: [phone]  Fax:  [phone]

## 2021-11-30 ENCOUNTER — Ambulatory Visit: Payer: Self-pay | Admitting: Physical Therapy

## 2021-12-05 ENCOUNTER — Telehealth: Payer: Self-pay | Admitting: Internal Medicine

## 2021-12-05 ENCOUNTER — Ambulatory Visit: Payer: Medicare Other | Admitting: Physical Therapy

## 2021-12-05 DIAGNOSIS — M6281 Muscle weakness (generalized): Secondary | ICD-10-CM

## 2021-12-05 DIAGNOSIS — G8929 Other chronic pain: Secondary | ICD-10-CM | POA: Diagnosis not present

## 2021-12-05 DIAGNOSIS — R279 Unspecified lack of coordination: Secondary | ICD-10-CM | POA: Diagnosis not present

## 2021-12-05 DIAGNOSIS — M25562 Pain in left knee: Secondary | ICD-10-CM | POA: Diagnosis not present

## 2021-12-05 DIAGNOSIS — R2689 Other abnormalities of gait and mobility: Secondary | ICD-10-CM | POA: Diagnosis not present

## 2021-12-05 DIAGNOSIS — M1712 Unilateral primary osteoarthritis, left knee: Secondary | ICD-10-CM | POA: Diagnosis not present

## 2021-12-05 NOTE — Telephone Encounter (Signed)
Spoke with patient who states she has been experiencing more swelling in legs since knee surgery. She has not been weighing daily. She denies SOB but states she does have asthma. She is able to get her shoes on and states the swelling is worse in the knee area.   She takes torsemide '20mg'$  daily and avoids salty foods.  Advised patient Weigh daily in the morning after using restroom before getting dressed. If weight gain of 2lbs in 24 hours or 5lbs in a week, notify office.  She is going to monitor weight for a few days and call back with results. She is scheduled to see Dr. Caryl Comes 12/30/2021.   Advised her to call back if symptoms worsen, she develops SOB or other symptoms. Patient verbalized understanding.

## 2021-12-05 NOTE — Telephone Encounter (Signed)
Pt c/o swelling: STAT is pt has developed SOB within 24 hours  How much weight have you gained and in what time span?  Patient assumes she has gained weight, but she is not sure how much weight she has gained.  If swelling, where is the swelling located?  Legs   Are you currently taking a fluid pill?  Yes, Torsemide   Are you currently SOB?  No   Do you have a log of your daily weights (if so, list)?   Have you gained 3 pounds in a day or 5 pounds in a week?   Have you traveled recently?  No, but patient states she has been exercising recently due to total knee replacement and she tries to elevate her legs, but she is not doing it every day

## 2021-12-05 NOTE — Therapy (Signed)
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 eye    cataract removed.   Chiari malformation    Diverticulosis    Dysrhythmia    SVT   GERD (gastroesophageal reflux disease)    H/O osteopenia    08/2006   H/O varicella    Heart murmur    Hypertension    Monilial vaginitis 07/2007   Multiple allergies    Ovarian cyst    Personal history of colonic adenomas 03/13/2012   Personal history of radiation therapy 1990   Pneumonia 07/2014   VIN III (vulvar intraepithelial neoplasia III) 03/2009   Yeast infection    Past Surgical History:  Procedure Laterality Date   ABDOMINAL HYSTERECTOMY     ABDOMINAL SURGERY     BREAST EXCISIONAL BIOPSY Right    benign   BREAST LUMPECTOMY Left 1990   BREAST SURGERY     Lumpectomy, left breast   CARDIOVERSION N/A 07/28/2020   Procedure: CARDIOVERSION;  Surgeon: Donato Heinz, MD;  Location: [facility];  Service: Cardiovascular;  Laterality: N/A;   CATARACT EXTRACTION Left 2018   COLONOSCOPY     COLONOSCOPY W/ BIOPSIES     EYE SURGERY     HERNIA REPAIR     KNEE SURGERY     right   lt. eye aneurysm surgery Left 2017   MOUTH SURGERY  2019   rt. side gum surgery  Lt. done 3 years ago   RADIOACTIVE SEED GUIDED EXCISIONAL BREAST BIOPSY Right 07/09/2018   Procedure: RADIOACTIVE SEED GUIDED EXCISIONAL RIGHT  BREAST BIOPSY;  Surgeon: Stark Klein, MD;  Location: [facility];  Service: General;  Laterality: Right;   TOTAL KNEE ARTHROPLASTY Right 09/09/2019   Procedure: RIGHT TOTAL KNEE ARTHROPLASTY;  Surgeon: Melrose Nakayama, MD;  Location:  [facility];  Service: Orthopedics;  Laterality: Right;   Patient Active Problem List   Diagnosis Date Noted   Leukocytopenia 09/15/2020   Deficiency anemia 09/15/2020   Persistent atrial fibrillation (HCC)    Primary osteoarthritis of right knee 09/09/2019   (HFpEF) heart failure with preserved ejection fraction ([place]) 04/13/2019   SVT (supraventricular tachycardia) 08/04/2017   Wide-complex tachycardia 08/03/2017   Asthma 08/06/2014   Anxiety about health 08/06/2014   Essential hypertension 08/06/2014   Personal history of colonic adenomas 03/20/2012   Chiari malformation    Heart murmur    H/O varicella    Yeast infection    Cancer ([place])    H/O osteopenia    Abnormal Pap smear    Vulvar intraepithelial neoplasia III (VIN III) 04/12/2009   Atrophy of vagina 06/23/2005    PCP: Steva Ready Avva  REFERRING PROVIDER: Melrose Nakayama  REFERRING DIAG: L knee pain sp R TKA in 2021  THERAPY DIAG:  No diagnosis found.  Rationale for Evaluation and Treatment Rehabilitation  ONSET DATE: 09/13/21  SUBJECTIVE:   SUBJECTIVE STATEMENT: Patient reports improved pain today. She saw Mitzi Hansen at [facility] office. He recommends the aquatics therapy. Her husband is having surgery on his hand and wrist 11/29 F/B  hip surgery. She is performing the HEP.   PERTINENT HISTORY: R TKA, heart failure, edema in ankles, HTN, Chiari malformation   PAIN:  Are you having pain? Yes: NPRS scale: 6-7/10 Pain location: L knee Pain description: dull, achy  Aggravating factors: standing and walking for too long  Relieving factors: rubbing it with alcohol, Voltaren   PRECAUTIONS: Fall  WEIGHT BEARING RESTRICTIONS No  FALLS:  Has patient fallen in last 6 months? Yes. Number of falls 1  LIVING ENVIRONMENT: Lives with: lives with their spouse Lives in: House/apartment Stairs: Yes: External: 3 steps; on right going up Has following equipment at home: Single point cane and Walker - 2  wheeled  OCCUPATION: retired  PLOF: Independent  PATIENT GOALS to be able to stand and fix my posture    OBJECTIVE:   PATIENT SURVEYS:  FOTO 38/100  COGNITION:  Overall cognitive status: Within functional limits for tasks assessed     SENSATION: WFL   POSTURE: rounded shoulders, forward head, increased thoracic kyphosis, and flexed trunk    LOWER EXTREMITY ROM:  WFL    LOWER EXTREMITY MMT:  MMT Right eval Left eval R/L 11/11/21  Hip flexion 3+ 3+ 4/4  Hip extension     Hip abduction 4- 4- 4/4  Hip adduction 4- 4- 4/4  Hip internal rotation     Hip external rotation     Knee flexion 3+ 3+ 4+/4+  Knee extension 3+ 3+ 4/4- with pain  Ankle dorsiflexion     Ankle plantarflexion     Ankle inversion     Ankle eversion      (Blank rows = not tested)   FUNCTIONAL TESTS:  5 times sit to stand: 11/11/21 32.54 sec from mat without UE Timed up and go (TUG): 45.53s  11/11/21 35.67 with SPC  GAIT: Distance walked: in clinic distances Assistive device utilized: Single point cane Level of assistance: Modified independence Comments: slowed gait, unsteady, decreased step length, decreased arm swing    TODAY'S TREATMENT: 12/05/21 NuStep L5 x 6 minutes Seated flutter kick x 30 seconds. Mini squat x 10 to elevated mat. Side stepping R and L, 4 x 5 steps each way. Ambulation 3 x 50' with cane and MI, improved step through gait. VASO to L knee 10 min, low press, 34 degrees.  11/28/21 NuStep L4 x 6 minutes Re-assessment, see goals Mini squats x 10 Seated long kicks, flutter kick x 30 sec, heel rais/toe raise, 10 reps each Vaso to L knee x 10 minutes, low pressure, 34 degrees   11/21/21 NuStep L3 x 6 minutes Seated long kicks, no weight, 2 x 10 each leg Seated step taps on 4" step L,2" step, R 2 x 10 each leg Seated clamshells against G Tband, 2 x 10 reps Vaso to R knee for pain, 34 degrees, low pressure, 10 minutes.  11/14/21 LAQ 3# 2x10 Marching 3#  2x10 Standing hip abd 3# 3x5 Seated clamshells greenTB 2x10 S2S 2x5, second set with red ball push out  Ball squeezes 2x10    11/11/21 Nustep L 5 52mn LE only Green tband HS curl 2 sets STS with wt ball chest press to encourage upright trunk 2 sets 5 3# LAQ 2 sets 10 3# hip flex and abd seated stepping up onto 6 inch box 10x BIL Green tband clams 10 x BIL , 10 x SL Green tband hip flex 2 sets 10   10/27/21 NuStep L4 x682ms  LAQ 1.5# 2x10  HS curls redTB 2x10  Seated  clamshells redTB 2x10  Heel taps 4" holding on to rail 20 reps   10/20/21 NuStep L3 x 6 minutes Seated long kicks, no weight 2x10 reps each Sit to stand from elevated mat 2x5 reps Hamstring curls red x10 Standing chest press and shoulder flex holding cane in BUE 5 reps each-reported L knee felt slightly unstable. Knee add ball squeeze   10/14/21 NuStep L3 x 6 minutes Unable to tolerate Supine Seated long kicks, no weight due to sore knee after last treatment 10 reps each Seated lateral trunk flexion, touching elbow to  mat on each side 10 reps each Lat lean to R lift LLE up to the side to the top of mat x 5, repeat with RLE Sit to stand from elevated mat with BUE on the back of chair turned backwards, 5 reps Practiced sit to stand from regular chair with armrests, emphasizing forward lean-6  reps Standing chest press and sB shoulder flex holding cane in BUE 5 reps each-reported L knee felt slightly unstable.   09/1821 NuStep L2 x 6 min LE only Red tband HS curls 2x10 LAQ 2lb 2x10  Seated March 2lb 2x10 Ball b/n knees squeeze Seated toe rasises and heel raises   10/04/21 Nustep level 3 x 6 minutes LE's only Red tband HS curls LAQ's 2x10 Ball b/n knees squeeze Red tband clamshells in sitting Seated toe rasises and heel raises Education on the pool program    PATIENT EDUCATION:  Education details: POC Person educated: Patient Education method: Explanation Education comprehension: verbalized  understanding   HOME EXERCISE PROGRAM:  [id number]  ASSESSMENT:  CLINICAL IMPRESSION: UPOC today. Patient saw Mitzi Hansen, at Dr [name] office today. He agreed that aquatic therapy would be good for her. He also recommended contacting heart Dr to possibly increase diuretics as he feels she is retaining too much fluid. So far, aquatics has only been able to schedule her 1 x/week. Her knee pain was much improved today, and she was able to tolerate increased activity/strengthening, so feel she would benefit from continuing dryland therapy 1 x/week if she cannot schedule aquatics for 2 x/week. She is also continuing her HEP.   OBJECTIVE IMPAIRMENTS Abnormal gait, decreased balance, decreased mobility, difficulty walking, decreased ROM, decreased strength, decreased safety awareness, impaired UE functional use, improper body mechanics, obesity, and pain.   ACTIVITY LIMITATIONS carrying, lifting, bending, sitting, standing, squatting, stairs, transfers, and locomotion level  PARTICIPATION LIMITATIONS: meal prep, cleaning, laundry, shopping, and community activity  PERSONAL FACTORS Age, Behavior pattern, Time since onset of injury/illness/exacerbation, and 1-2 comorbidities: obesity, Chiari malformation, heart murmur  are also affecting patient's functional outcome.   REHAB POTENTIAL: Fair   CLINICAL DECISION MAKING: Evolving/moderate complexity  EVALUATION COMPLEXITY: Low   GOALS: Goals reviewed with patient? Yes  SHORT TERM GOALS: Target date: 11/03/21  Patient will be independent with initial HEP. Goal status: met 11/11/21   LONG TERM GOALS: Target date: 01/04/22  Patient will be independent with advanced/ongoing HEP to improve outcomes and carryover.  Goal status: ongoing  2.  Patient will report at least 75% improvement in L knee pain to improve QOL. Baseline: 7/10, 11/28/21 up to 8/10 Goal status: ongoing   3.  Patient will demonstrate improved functional LE strength as  demonstrated by 4/5 in BLE. Baseline: 3+/5, 11/6-3+/5 Goal status: ongoing  5.  Patient will be able to ambulate 300' with LRAD and normal gait pattern without increased pain to access community.  Baseline: walking w/SPC, 11/6- Able to walk in from parking  lot, but then fatigued, approximately 100' Goal status: on going- fwd flexed with SPC and very antalgic 11/11/21  6. Patient will be able to ascend/descend stairs with 1 HR and reciprocal step pattern safely to access home and community.  Goal status: Requires handrail  and step to gait pattern  7.  Patient will report 61 on FOTO (patient reported outcome measure) to demonstrate improved functional ability. Baseline: 38, 11/6-37 Goal status: ongoing   PLAN: PT FREQUENCY: 1-2x/week  PT DURATION: 10 weeks  PLANNED INTERVENTIONS: Therapeutic exercises, Therapeutic activity, Neuromuscular re-education, Balance training, Gait training, Patient/Family education, Self Care, Joint mobilization, Stair training, Electrical stimulation, Cryotherapy, Moist heat, Taping, Vasopneumatic device, Traction, Ionotophoresis 79m/ml Dexamethasone, and Manual therapy, aquatic therapy  PLAN FOR NEXT SESSION: Re-assess HEP. What did Dr DLatanya Maudlinsay?  Patient Details  Name: FElvy MclartyMRN: [id number] of Birth: 707/20/1947Referring Provider:  APrince Solian MD  Encounter Date: 11/14/2021   SEthel RanaDPT 12/05/21 3:40 PM  11/14/2021, 9:35 AM  [place] [address]Phone: [phone]  Fax:  [phone]

## 2021-12-07 ENCOUNTER — Ambulatory Visit: Payer: Medicare Other | Admitting: Physical Therapy

## 2021-12-07 DIAGNOSIS — E782 Mixed hyperlipidemia: Secondary | ICD-10-CM | POA: Diagnosis not present

## 2021-12-07 DIAGNOSIS — M1990 Unspecified osteoarthritis, unspecified site: Secondary | ICD-10-CM | POA: Diagnosis not present

## 2021-12-07 DIAGNOSIS — J45909 Unspecified asthma, uncomplicated: Secondary | ICD-10-CM | POA: Diagnosis not present

## 2021-12-07 DIAGNOSIS — J309 Allergic rhinitis, unspecified: Secondary | ICD-10-CM | POA: Diagnosis not present

## 2021-12-07 DIAGNOSIS — I519 Heart disease, unspecified: Secondary | ICD-10-CM | POA: Diagnosis not present

## 2021-12-07 DIAGNOSIS — Z23 Encounter for immunization: Secondary | ICD-10-CM | POA: Diagnosis not present

## 2021-12-07 DIAGNOSIS — I10 Essential (primary) hypertension: Secondary | ICD-10-CM | POA: Diagnosis not present

## 2021-12-07 DIAGNOSIS — M25512 Pain in left shoulder: Secondary | ICD-10-CM | POA: Diagnosis not present

## 2021-12-07 DIAGNOSIS — D72819 Decreased white blood cell count, unspecified: Secondary | ICD-10-CM | POA: Diagnosis not present

## 2021-12-07 DIAGNOSIS — F419 Anxiety disorder, unspecified: Secondary | ICD-10-CM | POA: Diagnosis not present

## 2021-12-07 DIAGNOSIS — Q078 Other specified congenital malformations of nervous system: Secondary | ICD-10-CM | POA: Diagnosis not present

## 2021-12-07 DIAGNOSIS — I4891 Unspecified atrial fibrillation: Secondary | ICD-10-CM | POA: Diagnosis not present

## 2021-12-07 DIAGNOSIS — J4521 Mild intermittent asthma with (acute) exacerbation: Secondary | ICD-10-CM | POA: Diagnosis not present

## 2021-12-12 ENCOUNTER — Ambulatory Visit: Payer: Medicare Other | Admitting: Physical Therapy

## 2021-12-14 ENCOUNTER — Other Ambulatory Visit: Payer: Self-pay | Admitting: Internal Medicine

## 2021-12-14 DIAGNOSIS — I4892 Unspecified atrial flutter: Secondary | ICD-10-CM

## 2021-12-14 NOTE — Telephone Encounter (Signed)
Patient is calling stating she only has one tablet left. Please advise.

## 2021-12-14 NOTE — Telephone Encounter (Signed)
Xarelto '20mg'$  refill request received. Pt is 76 years old, weight-113.9kg, Crea-0.70 on 05/25/2021 via scanned lab from [facility], last seen by on Dr. Caryl Comes 05/26/2021, Diagnosis-Afib, CrCl-122.94 mL/min; Dose is appropriate based on dosing criteria. Will send in refill to requested pharmacy.

## 2021-12-17 DIAGNOSIS — Z23 Encounter for immunization: Secondary | ICD-10-CM | POA: Diagnosis not present

## 2021-12-19 ENCOUNTER — Ambulatory Visit: Payer: Self-pay | Admitting: Physical Therapy

## 2021-12-19 ENCOUNTER — Encounter: Payer: Self-pay | Admitting: Physical Therapy

## 2021-12-19 DIAGNOSIS — R279 Unspecified lack of coordination: Secondary | ICD-10-CM | POA: Diagnosis not present

## 2021-12-19 DIAGNOSIS — R2689 Other abnormalities of gait and mobility: Secondary | ICD-10-CM | POA: Diagnosis not present

## 2021-12-19 DIAGNOSIS — M25562 Pain in left knee: Secondary | ICD-10-CM | POA: Diagnosis not present

## 2021-12-19 DIAGNOSIS — M6281 Muscle weakness (generalized): Secondary | ICD-10-CM

## 2021-12-19 DIAGNOSIS — G8929 Other chronic pain: Secondary | ICD-10-CM | POA: Diagnosis not present

## 2021-12-19 NOTE — Therapy (Signed)
OUTPATIENT PHYSICAL THERAPY LOWER EXTREMITY TREATMENT NOTE (AQUATIC THERAPY)   Patient Name: Bridget Cortez MRN: [id number] DOB:Jun 12, 1945, 76 y.o., female Today's Date: 12/19/2021   PT End of Session - 12/19/21 1854     Visit Number 16    Date for PT Re-Evaluation 01/04/22    Authorization Type Medicare    PT Start Time 1325   delayed starting due to pt having to change clothes upon arrival at pool   PT Stop Time 1400    PT Time Calculation (min) 35 min    Equipment Utilized During Treatment Other (comment)   single large barbell   Activity Tolerance Patient tolerated treatment well    Behavior During Therapy [facility] for tasks assessed/performed              Past Medical History:  Diagnosis Date   Abnormal Pap smear 2006   vaginal medicine according to patient   Allergy    Arthritis    Asthma    Atrophy of vagina 06/2005   Breast cancer ([place]) 1999   Left   CAP (community acquired pneumonia) 08/06/2014   Cataract    cataracts lt. eye    cataract removed.   Chiari malformation    Diverticulosis    Dysrhythmia    SVT   GERD (gastroesophageal reflux disease)    H/O osteopenia    08/2006   H/O varicella    Heart murmur    Hypertension    Monilial vaginitis 07/2007   Multiple allergies    Ovarian cyst    Personal history of colonic adenomas 03/13/2012   Personal history of radiation therapy 1990   Pneumonia 07/2014   VIN III (vulvar intraepithelial neoplasia III) 03/2009   Yeast infection    Past Surgical History:  Procedure Laterality Date   ABDOMINAL HYSTERECTOMY     ABDOMINAL SURGERY     BREAST EXCISIONAL BIOPSY Right    benign   BREAST LUMPECTOMY Left 1990   BREAST SURGERY     Lumpectomy, left breast   CARDIOVERSION N/A 07/28/2020   Procedure: CARDIOVERSION;  Surgeon: Donato Heinz, MD;  Location: [place];  Service: Cardiovascular;  Laterality: N/A;   CATARACT EXTRACTION Left 2018   COLONOSCOPY     COLONOSCOPY W/ BIOPSIES     EYE  SURGERY     HERNIA REPAIR     KNEE SURGERY     right   lt. eye aneurysm surgery Left 2017   MOUTH SURGERY  2019   rt. side gum surgery  Lt. done 3 years ago   RADIOACTIVE SEED GUIDED EXCISIONAL BREAST BIOPSY Right 07/09/2018   Procedure: RADIOACTIVE SEED GUIDED EXCISIONAL RIGHT  BREAST BIOPSY;  Surgeon: Stark Klein, MD;  Location: [place];  Service: General;  Laterality: Right;   TOTAL KNEE ARTHROPLASTY Right 09/09/2019   Procedure: RIGHT TOTAL KNEE ARTHROPLASTY;  Surgeon: Melrose Nakayama, MD;  Location: [facility];  Service: Orthopedics;  Laterality: Right;   Patient Active Problem List   Diagnosis Date Noted   Leukocytopenia 09/15/2020   Deficiency anemia 09/15/2020   Persistent atrial fibrillation (HCC)    Primary osteoarthritis of right knee 09/09/2019   (HFpEF) heart failure with preserved ejection fraction ([place]) 04/13/2019   SVT (supraventricular tachycardia) 08/04/2017   Wide-complex tachycardia 08/03/2017   Asthma 08/06/2014   Anxiety about health 08/06/2014   Essential hypertension 08/06/2014   Personal history of colonic adenomas 03/20/2012   Chiari malformation    Heart murmur    H/O varicella    Yeast  infection    Cancer ([place])    H/O osteopenia    Abnormal Pap smear    Vulvar intraepithelial neoplasia III (VIN III) 04/12/2009   Atrophy of vagina 06/23/2005    PCP: [name]  REFERRING PROVIDER: Melrose Nakayama  REFERRING DIAG: L knee pain sp R TKA in 2021  THERAPY DIAG:  Chronic pain of left knee  Other abnormalities of gait and mobility  Muscle weakness (generalized)  Rationale for Evaluation and Treatment Rehabilitation  ONSET DATE: 09/13/21  SUBJECTIVE:   SUBJECTIVE STATEMENT: Pt reports she drove herself to the pool today for this aquatic PT appt; says her husband had gone to the [employer] with their grandson and did not get back so she drove herself; pt arrived to pool area at 1:10 but then had to change clothes for pool appt so session was delayed in  starting due to time required for clothing change  PERTINENT HISTORY: R TKA, heart failure, edema in ankles, HTN, Chiari malformation   PAIN:  Are you having pain? Yes: NPRS scale: 3/10 Pain location: Rt knee Pain description: dull, achy  Aggravating factors: standing and walking for too long  :  fall sustained on evening of 10-23-21 caused the pain in Rt knee  Relieving factors: rubbing it with alcohol, Voltaren   PRECAUTIONS: Fall  WEIGHT BEARING RESTRICTIONS No  FALLS:  Has patient fallen in last 6 months? Yes. Number of falls 1  LIVING ENVIRONMENT: Lives with: lives with their spouse Lives in: House/apartment Stairs: Yes: External: 3 steps; on right going up Has following equipment at home: Single point cane and Walker - 2 wheeled  OCCUPATION: retired  PLOF: Independent  PATIENT GOALS to be able to stand and fix my posture    OBJECTIVE:    12-19-21:  Aquatic therapy at [facility] 90 degrees; pt using SPC  for assistance with amb. But used facility's wheelchair to access pool area with assistance from [place] staff from front desk (approx. 150') due to prolonged distance  Patient seen for aquatic therapy today.  Treatment took place in water 3.6-4.0 feet deep depending upon activity.  Pt entered and exited  the pool via step negotiation with use of bilateral hand rails using step by step sequence with CGA.  Marching in place with UE support on pool edge 10 reps at start of session for warm up  Pt used large single bar bell for UE support with min assist for stabilization and for increased confidence and security  Pt performed water walking 18' x approx. 4 reps across pool with use of single bar bell with min assist for stabilization by PT  Sidestepping with UE support on large bar bell with mod assist for stabilization - 18' x 4 reps  Backwards amb. With UE support on large bar bell 18' x 2 reps with min to mod assist for stabilization of bar bell  Pt  performed standing bil. LE strengthening exercises with bil. UE support on pool edge - standing hip flexion with knee extended 10 reps; standing hip abduction 10 reps each leg and , hip extension with knee extended 10 reps each leg:  marching 10 reps each leg with bil. UE support on pool edge   Pt requires buoyancy of water for support for reduced fall risk and also for joint offloading to decrease stress and strain on knees with weight bearing; buoyancy resisted exercises provide resistance as well as the viscosity of water providing resistance for strengthening; buoyancy assisted exercises provide  increased active assisted ROM.  Water current provides perturbations for improved static & dynamic standing balance and for improved trunk/core stabilization   PATIENT EDUCATION:  Education details: benefits of aquatic therapy Person educated: Patient Education method: Explanation Education comprehension: verbalized understanding   HOME EXERCISE PROGRAM: TBD  ASSESSMENT:  CLINICAL IMPRESSION: Aquatic therapy session focused on water walking in various directions and LE AROM/strengthening exercises using buoyancy of water for resistance with viscosity of water for strengthening.  Pt is improving with balance in pool with increased confidence noted, but continues to require min assist from PT for stabilization of bar bell.  Cont with POC.  OBJECTIVE IMPAIRMENTS Abnormal gait, decreased balance, decreased mobility, difficulty walking, decreased ROM, decreased strength, decreased safety awareness, impaired UE functional use, improper body mechanics, obesity, and pain.   ACTIVITY LIMITATIONS carrying, lifting, bending, sitting, standing, squatting, stairs, transfers, and locomotion level  PARTICIPATION LIMITATIONS: meal prep, cleaning, laundry, shopping, and community activity  PERSONAL FACTORS Age, Behavior pattern, Time since onset of injury/illness/exacerbation, and 1-2 comorbidities: obesity,  Chiari malformation, heart murmur   are also affecting patient's functional outcome.   REHAB POTENTIAL: Fair    CLINICAL DECISION MAKING: Evolving/moderate complexity  EVALUATION COMPLEXITY: Low   GOALS:  Goals reviewed with patient? Yes  SHORT TERM GOALS: Target date: 11/03/21  Patient will be independent with initial HEP. Goal status: INITIAL     LONG TERM GOALS: Target date: 01/04/22  Patient will be independent with advanced/ongoing HEP to improve outcomes and carryover.  Goal status: ongoing  2.  Patient will report at least 75% improvement in L knee pain to improve QOL. Baseline: 7/10, 11/28/21 up to 8/10 Goal status: ongoing   3.  Patient will demonstrate improved functional LE strength as demonstrated by 4/5 in BLE. Baseline: 3+/5, 11/6-3+/5 Goal status: ongoing  5.  Patient will be able to ambulate 300' with LRAD and normal gait pattern without increased pain to access community.  Baseline: walking w/SPC, 11/6- Able to walk in from parking lot, but then fatigued, approximately 100' Goal status: on going- fwd flexed with SPC and very antalgic 11/11/21  6. Patient will be able to ascend/descend stairs with 1 HR and reciprocal step pattern safely to access home and community.  Goal status: Requires handrail  and step to gait pattern  7.  Patient will report 37 on FOTO (patient reported outcome measure) to demonstrate improved functional ability. Baseline: 38, 11/6-37 Goal status: ongoing  PLAN:  PT FREQUENCY: 1-2x/week  PT DURATION: 10 weeks  PLANNED INTERVENTIONS: Therapeutic exercises, Therapeutic activity, Neuromuscular re-education, Balance training, Gait training, Patient/Family education, Self Care, Joint mobilization, Stair training, Electrical stimulation, Cryotherapy, Moist heat, Taping, Vasopneumatic device, Traction, Ionotophoresis '4mg'$ /ml Dexamethasone, and Manual therapy  PLAN FOR NEXT SESSION:   low level LE strengthening, gait training, Nustep,  balance     Guido Sander, PT 12/19/2021, 7:01 PM  [facility] [address] [place], [place] [address] [phone]

## 2021-12-20 ENCOUNTER — Encounter: Payer: Self-pay | Admitting: Physical Therapy

## 2021-12-20 ENCOUNTER — Ambulatory Visit: Payer: Medicare Other | Admitting: Physical Therapy

## 2021-12-20 DIAGNOSIS — R279 Unspecified lack of coordination: Secondary | ICD-10-CM | POA: Diagnosis not present

## 2021-12-20 DIAGNOSIS — G8929 Other chronic pain: Secondary | ICD-10-CM | POA: Diagnosis not present

## 2021-12-20 DIAGNOSIS — M6281 Muscle weakness (generalized): Secondary | ICD-10-CM

## 2021-12-20 DIAGNOSIS — R2689 Other abnormalities of gait and mobility: Secondary | ICD-10-CM | POA: Diagnosis not present

## 2021-12-20 DIAGNOSIS — M25562 Pain in left knee: Secondary | ICD-10-CM | POA: Diagnosis not present

## 2021-12-20 NOTE — Therapy (Signed)
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 eye    cataract removed.   Chiari malformation    Diverticulosis    Dysrhythmia    SVT   GERD (gastroesophageal reflux disease)    H/O osteopenia    08/2006   H/O varicella    Heart murmur    Hypertension    Monilial vaginitis 07/2007   Multiple allergies    Ovarian cyst    Personal history of colonic adenomas 03/13/2012   Personal history of radiation therapy 1990   Pneumonia 07/2014   VIN III (vulvar intraepithelial neoplasia III) 03/2009   Yeast infection    Past Surgical History:  Procedure Laterality Date   ABDOMINAL HYSTERECTOMY     ABDOMINAL SURGERY     BREAST EXCISIONAL BIOPSY Right    benign   BREAST LUMPECTOMY Left 1990   BREAST SURGERY     Lumpectomy, left breast   CARDIOVERSION N/A 07/28/2020   Procedure: CARDIOVERSION;  Surgeon: Donato Heinz, MD;  Location: [facility];  Service: Cardiovascular;  Laterality: N/A;   CATARACT EXTRACTION Left 2018   COLONOSCOPY     COLONOSCOPY W/ BIOPSIES     EYE SURGERY     HERNIA REPAIR     KNEE SURGERY     right   lt. eye aneurysm surgery Left 2017   MOUTH SURGERY  2019   rt. side gum surgery  Lt. done 3 years ago   RADIOACTIVE SEED GUIDED EXCISIONAL BREAST BIOPSY Right 07/09/2018   Procedure: RADIOACTIVE SEED GUIDED EXCISIONAL RIGHT  BREAST BIOPSY;  Surgeon: Stark Klein, MD;  Location: [facility];  Service: General;  Laterality: Right;   TOTAL KNEE ARTHROPLASTY Right 09/09/2019   Procedure: RIGHT TOTAL KNEE ARTHROPLASTY;  Surgeon: Melrose Nakayama, MD;  Location:  [facility];  Service: Orthopedics;  Laterality: Right;   Patient Active Problem List   Diagnosis Date Noted   Leukocytopenia 09/15/2020   Deficiency anemia 09/15/2020   Persistent atrial fibrillation (HCC)    Primary osteoarthritis of right knee 09/09/2019   (HFpEF) heart failure with preserved ejection fraction ([place]) 04/13/2019   SVT (supraventricular tachycardia) 08/04/2017   Wide-complex tachycardia 08/03/2017   Asthma 08/06/2014   Anxiety about health 08/06/2014   Essential hypertension 08/06/2014   Personal history of colonic adenomas 03/20/2012   Chiari malformation    Heart murmur    H/O varicella    Yeast infection    Cancer ([place])    H/O osteopenia    Abnormal Pap smear    Vulvar intraepithelial neoplasia III (VIN III) 04/12/2009   Atrophy of vagina 06/23/2005    PCP: Steva Ready Avva  REFERRING PROVIDER: Melrose Nakayama  REFERRING DIAG: L knee pain sp R TKA in 2021  THERAPY DIAG:  No diagnosis found.  Rationale for Evaluation and Treatment Rehabilitation  ONSET DATE: 09/13/21  SUBJECTIVE:   SUBJECTIVE STATEMENT:  "I feel pretty good, Right now coming in I'm not in any pain"   PERTINENT HISTORY: R TKA, heart failure, edema in ankles, HTN, Chiari malformation  PAIN:  Are you having pain? Yes: NPRS scale: 0/10 Pain location: L knee Pain description: dull, achy  Aggravating factors: standing and walking for too long  Relieving factors: rubbing it with alcohol, Voltaren   PRECAUTIONS: Fall  WEIGHT BEARING RESTRICTIONS No  FALLS:  Has patient fallen in last 6 months? Yes. Number of falls 1  LIVING ENVIRONMENT: Lives with: lives with their spouse Lives in: House/apartment Stairs: Yes: External: 3 steps; on right going up Has following equipment at home: Single point cane and Walker - 2 wheeled  OCCUPATION: retired  PLOF: Independent  PATIENT GOALS to be able to stand and fix my posture    OBJECTIVE:   PATIENT SURVEYS:  FOTO  38/100  COGNITION:  Overall cognitive status: Within functional limits for tasks assessed     SENSATION: WFL   POSTURE: rounded shoulders, forward head, increased thoracic kyphosis, and flexed trunk    LOWER EXTREMITY ROM:  WFL    LOWER EXTREMITY MMT:  MMT Right eval Left eval R/L 11/11/21  Hip flexion 3+ 3+ 4/4  Hip extension     Hip abduction 4- 4- 4/4  Hip adduction 4- 4- 4/4  Hip internal rotation     Hip external rotation     Knee flexion 3+ 3+ 4+/4+  Knee extension 3+ 3+ 4/4- with pain  Ankle dorsiflexion     Ankle plantarflexion     Ankle inversion     Ankle eversion      (Blank rows = not tested)   FUNCTIONAL TESTS:  5 times sit to stand: 11/11/21 32.54 sec from mat without UE Timed up and go (TUG): 45.53s  11/11/21 35.67 with SPC  GAIT: Distance walked: in clinic distances Assistive device utilized: Single point cane Level of assistance: Modified independence Comments: slowed gait, unsteady, decreased step length, decreased arm swing    TODAY'S TREATMENT: 12/20/21 NuStep L5 x 6 min  S2S elevated mat 3x5  Side stepping R and L, 3x 5 steps each way. Hamstring curls green 2x10 LAQ 2x10 no weight 4in box taps 2x5 each   12/05/21 NuStep L5 x 6 minutes Seated flutter kick x 30 seconds. Mini squat x 10 to elevated mat. Side stepping R and L, 4 x 5 steps each way. Ambulation 3 x 50' with cane and MI, improved step through gait. VASO to L knee 10 min, low press, 34 degrees.  11/28/21 NuStep L4 x 6 minutes Re-assessment, see goals Mini squats x 10 Seated long kicks, flutter kick x 30 sec, heel rais/toe raise, 10 reps each Vaso to L knee x 10 minutes, low pressure, 34 degrees   11/21/21 NuStep L3 x 6 minutes Seated long kicks, no weight, 2 x 10 each leg Seated step taps on 4" step L,2" step, R 2 x 10 each leg Seated clamshells against G Tband, 2 x 10 reps Vaso to R knee for pain, 34 degrees, low pressure, 10 minutes.  11/14/21 LAQ 3#  2x10 Marching 3# 2x10 Standing hip abd 3# 3x5 Seated clamshells greenTB 2x10 S2S 2x5, second set with red ball push out  Ball squeezes 2x10    11/11/21 Nustep L 5 25mn LE only Green tband HS curl 2 sets STS with wt ball chest press to encourage upright trunk 2 sets 5 3# LAQ 2 sets 10 3# hip flex and abd seated stepping up onto 6 inch box 10x BIL Green tband clams 10 x BIL , 10 x SL Green tband hip flex 2 sets 10  PATIENT EDUCATION:  Education details: POC Person educated: Patient Education method: Explanation Education comprehension: verbalized understanding   HOME EXERCISE PROGRAM:  [id number]  ASSESSMENT:  CLINICAL IMPRESSION: Pt enters ~ 8 minutes late feeling well. Pt stated that she started aquatic therapy yesterday and liked it. Pt did well tolerating more functional interventions such as sit to stand and box taps using SPC. Cue needed to prevent pushing LE against table with sit ot stands. Pt also required cue to complete full ROM with HS curls and extensions.   OBJECTIVE IMPAIRMENTS Abnormal gait, decreased balance, decreased mobility, difficulty walking, decreased ROM, decreased strength, decreased safety awareness, impaired UE functional use, improper body mechanics, obesity, and pain.   ACTIVITY LIMITATIONS carrying, lifting, bending, sitting, standing, squatting, stairs, transfers, and locomotion level  PARTICIPATION LIMITATIONS: meal prep, cleaning, laundry, shopping, and community activity  PERSONAL FACTORS Age, Behavior pattern, Time since onset of injury/illness/exacerbation, and 1-2 comorbidities: obesity, Chiari malformation, heart murmur  are also affecting patient's functional outcome.   REHAB POTENTIAL: Fair   CLINICAL DECISION MAKING: Evolving/moderate complexity  EVALUATION COMPLEXITY: Low   GOALS: Goals reviewed with patient? Yes  SHORT TERM GOALS: Target date: 11/03/21  Patient will be independent with initial HEP. Goal status: met  11/11/21   LONG TERM GOALS: Target date: 01/04/22  Patient will be independent with advanced/ongoing HEP to improve outcomes and carryover.  Goal status: ongoing  2.  Patient will report at least 75% improvement in L knee pain to improve QOL. Baseline: 7/10, 11/28/21 up to 8/10 Goal status: ongoing   3.  Patient will demonstrate improved functional LE strength as demonstrated by 4/5 in BLE. Baseline: 3+/5, 11/6-3+/5 Goal status: ongoing  5.  Patient will be able to ambulate 300' with LRAD and normal gait pattern without increased pain to access community.  Baseline: walking w/SPC, 11/6- Able to walk in from parking lot, but then fatigued, approximately 100' Goal status: on going- fwd flexed with SPC and very antalgic 11/11/21  6. Patient will be able to ascend/descend stairs with 1 HR and reciprocal step pattern safely to access home and community.  Goal status: Requires handrail  and step to gait pattern  7.  Patient will report 62 on FOTO (patient reported outcome measure) to demonstrate improved functional ability. Baseline: 38, 11/6-37 Goal status: ongoing   PLAN: PT FREQUENCY: 1-2x/week  PT DURATION: 10 weeks  PLANNED INTERVENTIONS: Therapeutic exercises, Therapeutic activity, Neuromuscular re-education, Balance training, Gait training, Patient/Family education, Self Care, Joint mobilization, Stair training, Electrical stimulation, Cryotherapy, Moist heat, Taping, Vasopneumatic device, Traction, Ionotophoresis 33m/ml Dexamethasone, and Manual therapy, aquatic therapy  PLAN FOR NEXT SESSION: Re-assess HEP. What did Dr DLatanya Maudlinsay?  RCheri FowlerPTA

## 2021-12-21 ENCOUNTER — Emergency Department (HOSPITAL_COMMUNITY)
Admission: EM | Admit: 2021-12-21 | Discharge: 2021-12-21 | Disposition: A | Discharge status: Home / Self Care | Payer: Medicare Other | Attending: Emergency Medicine | Admitting: Emergency Medicine

## 2021-12-21 ENCOUNTER — Emergency Department (HOSPITAL_COMMUNITY): Payer: Medicare Other

## 2021-12-21 ENCOUNTER — Other Ambulatory Visit: Payer: Self-pay

## 2021-12-21 DIAGNOSIS — I110 Hypertensive heart disease with heart failure: Secondary | ICD-10-CM | POA: Diagnosis not present

## 2021-12-21 DIAGNOSIS — I10 Essential (primary) hypertension: Secondary | ICD-10-CM | POA: Diagnosis not present

## 2021-12-21 DIAGNOSIS — I959 Hypotension, unspecified: Secondary | ICD-10-CM | POA: Diagnosis not present

## 2021-12-21 DIAGNOSIS — Z96651 Presence of right artificial knee joint: Secondary | ICD-10-CM | POA: Diagnosis not present

## 2021-12-21 DIAGNOSIS — M1712 Unilateral primary osteoarthritis, left knee: Secondary | ICD-10-CM | POA: Diagnosis not present

## 2021-12-21 DIAGNOSIS — S93402A Sprain of unspecified ligament of left ankle, initial encounter: Secondary | ICD-10-CM | POA: Insufficient documentation

## 2021-12-21 DIAGNOSIS — W19XXXA Unspecified fall, initial encounter: Secondary | ICD-10-CM | POA: Insufficient documentation

## 2021-12-21 DIAGNOSIS — Z7901 Long term (current) use of anticoagulants: Secondary | ICD-10-CM | POA: Insufficient documentation

## 2021-12-21 DIAGNOSIS — M7989 Other specified soft tissue disorders: Secondary | ICD-10-CM | POA: Diagnosis not present

## 2021-12-21 DIAGNOSIS — M549 Dorsalgia, unspecified: Secondary | ICD-10-CM | POA: Diagnosis not present

## 2021-12-21 DIAGNOSIS — S0990XA Unspecified injury of head, initial encounter: Secondary | ICD-10-CM | POA: Diagnosis not present

## 2021-12-21 DIAGNOSIS — Z79899 Other long term (current) drug therapy: Secondary | ICD-10-CM | POA: Diagnosis not present

## 2021-12-21 DIAGNOSIS — I509 Heart failure, unspecified: Secondary | ICD-10-CM | POA: Diagnosis not present

## 2021-12-21 DIAGNOSIS — Z043 Encounter for examination and observation following other accident: Secondary | ICD-10-CM | POA: Diagnosis not present

## 2021-12-21 DIAGNOSIS — T68XXXA Hypothermia, initial encounter: Secondary | ICD-10-CM | POA: Diagnosis not present

## 2021-12-21 DIAGNOSIS — S99912A Unspecified injury of left ankle, initial encounter: Secondary | ICD-10-CM | POA: Diagnosis present

## 2021-12-21 LAB — URINALYSIS, ROUTINE W REFLEX MICROSCOPIC
Bilirubin Urine: NEGATIVE
Glucose, UA: NEGATIVE mg/dL
Hgb urine dipstick: NEGATIVE
Ketones, ur: NEGATIVE mg/dL
Leukocytes,Ua: NEGATIVE
Nitrite: NEGATIVE
Protein, ur: NEGATIVE mg/dL
Specific Gravity, Urine: 1.01 (ref 1.005–1.030)
pH: 7 (ref 5.0–8.0)

## 2021-12-21 NOTE — Discharge Instructions (Addendum)
Your CT imaging was without any traumatic injury to your brain, your x-ray imaging did not reveal any bony injury or dislocation of your left knee or ankle.  You do have ankle swelling about the left lateral ankle consistent with likely ankle sprain.  We will place you in an Ace wrap and have you follow-up outpatient with your PCP and sports medicine as needed.  Tylenol and Ibuprofen for pain control, weightbearing as tolerated

## 2021-12-21 NOTE — ED Provider Notes (Signed)
[facility] DEPT Provider Note   CSN: [id number] Arrival date & time: 12/21/21  1322     History  Chief Complaint  Patient presents with   Bridget Cortez is a 76 y.o. female.  HPI   76 year old female with medical history significant for osteoarthritis status post total knee replacement of the right knee, Chiari malformation, osteopenia, HTN, SVT, CHF, atrial fibrillation on Xarelto who presents to the emergency department after a mechanical fall.  The patient states that she was going down steps when her left knee "just gave out on me" and she was able to lower herself in a controlled fall down 3 steps.  She landed on her left side it is unsure if she hit her head.  She denies any loss of consciousness.  She is on Xarelto.  She denies any headaches, vision changes, any other injuries or complaints.  She arrived to the emergency department GCS 15, ABC intact.  Home Medications Prior to Admission medications   Medication Sig Start Date End Date Taking? Authorizing Provider  acetaminophen (TYLENOL) 325 MG tablet Take 2 tablets (650 mg total) by mouth every 6 (six) hours as needed for mild pain, fever or headache (or Fever >/= 101). 08/11/14   Hongalgi, Lenis Dickinson, MD  albuterol (PROVENTIL) (5 MG/ML) 0.5% nebulizer solution Take 0.5 mLs (2.5 mg total) by nebulization every 6 (six) hours as needed for wheezing or shortness of breath. Patient taking differently: Take 2.5 mg by nebulization daily as needed for wheezing or shortness of breath. 02/23/18   Curatolo, Adam, DO  amLODipine (NORVASC) 10 MG tablet Take 5 mg by mouth at bedtime.     [provider]  Calcium Carbonate-Vitamin D (CALTRATE 600+D PO) Take 1 tablet by mouth daily with lunch.     [provider]  cetirizine (ZYRTEC) 10 MG tablet Take 10 mg by mouth daily.    [provider]  esomeprazole (NEXIUM) 40 MG capsule Take 40 mg by mouth 2 (two) times daily before  a meal.     [provider]  fluticasone (FLONASE) 50 MCG/ACT nasal spray Place 1 spray into both nostrils at bedtime.  03/16/12   [provider]  Fluticasone-Salmeterol (ADVAIR) 250-50 MCG/DOSE AEPB Inhale 1 puff into the lungs every 12 (twelve) hours.    [provider]  Multiple Vitamin (MULTIVITAMIN) tablet Take 1 tablet by mouth daily with lunch.     [provider]  nebivolol (BYSTOLIC) 5 MG tablet Take 1 tablet (5 mg total) by mouth daily. 07/05/21   Deboraha Sprang, MD  potassium chloride SA (KLOR-CON) 20 MEQ tablet Take 20 mEq by mouth 3 (three) times daily.    [provider]  rivaroxaban (XARELTO) 20 MG TABS tablet TAKE 1 TABLET BY MOUTH ONCE DAILY WITH SUPPER 12/14/21   Deboraha Sprang, MD  rosuvastatin (CRESTOR) 5 MG tablet Take 5 mg by mouth daily.    [provider]  simethicone (MYLICON) 916 MG chewable tablet Chew 125 mg by mouth every 6 (six) hours as needed for flatulence. Patient not taking: Reported on 05/26/2021    [provider]  telmisartan (MICARDIS) 40 MG tablet Take 40 mg by mouth daily.    [provider]  torsemide (DEMADEX) 20 MG tablet Take 1 tablet by mouth once daily 09/20/21   Deboraha Sprang, MD  traMADol (ULTRAM) 50 MG tablet Take 50 mg by mouth as needed. Patient not taking: Reported on 05/26/2021 11/05/20  [provider]  traZODone (DESYREL) 50 MG tablet Take 25 mg by mouth at bedtime as needed for sleep. Patient not taking: Reported on 05/26/2021    [provider]  triamcinolone cream (KENALOG) 0.1 % Apply 1 application topically daily as needed (Mold). Patient not taking: Reported on 05/26/2021 07/05/20   [provider]  vitamin C (ASCORBIC ACID) 500 MG tablet Take 500 mg by mouth daily.    [provider]      Allergies    Ephedrine and Cortisone    Review of Systems   Review of Systems  All other systems reviewed and are negative.   Physical  Exam Updated Vital Signs BP (!) 159/91 (BP Location: Right Arm)   Pulse 91   Temp 97.8 F (36.6 C) (Oral)   Resp 16   SpO2 92%  Physical Exam Vitals and nursing note reviewed.  Constitutional:      General: She is not in acute distress.    Appearance: She is well-developed.     Comments: GCS 15, ABC intact  HENT:     Head: Normocephalic and atraumatic.  Eyes:     Extraocular Movements: Extraocular movements intact.     Conjunctiva/sclera: Conjunctivae normal.     Pupils: Pupils are equal, round, and reactive to light.  Neck:     Comments: No midline tenderness to palpation of the cervical spine.  Range of motion intact Cardiovascular:     Rate and Rhythm: Normal rate and regular rhythm.  Pulmonary:     Effort: Pulmonary effort is normal. No respiratory distress.     Breath sounds: Normal breath sounds.  Chest:     Comments: Clavicles stable nontender to AP compression.  Chest wall stable and nontender to AP and lateral compression. Abdominal:     Palpations: Abdomen is soft.     Tenderness: There is no abdominal tenderness.     Comments: Pelvis stable to lateral compression  Musculoskeletal:     Cervical back: Neck supple.     Comments: No midline tenderness to palpation of the thoracic or lumbar spine.  Extremities atraumatic with intact range of motion with mild tenderness about the lateral aspect of the left ankle, mild tenderness about the proximal tibia on the left, distally neurovascular intact  Skin:    General: Skin is warm and dry.  Neurological:     Mental Status: She is alert.     Comments: Cranial nerves II through XII grossly intact.  Moving all 4 extremities spontaneously.  Sensation grossly intact all 4 extremities     ED Results / Procedures / Treatments   Labs (all labs ordered are listed, but only abnormal results are displayed) Labs Reviewed  URINALYSIS, ROUTINE W REFLEX MICROSCOPIC    EKG EKG Interpretation  Date/Time:  Wednesday December 21 2021 14:41:28 EST Ventricular Rate:  77 PR Interval:  194 QRS Duration: 110 QT Interval:  409 QTC Calculation: 463 R Axis:   -18 Text Interpretation: Sinus arrhythmia Consider left atrial enlargement Probable left ventricular hypertrophy Borderline T abnormalities, anterior leads Confirmed by Regan Lemming ([id number]) on 12/21/2021 2:49:35 PM  Radiology DG Ankle Left Port  Result Date: 12/21/2021 CLINICAL DATA:  Lost balance and bowel. EXAM: PORTABLE LEFT ANKLE - 2 VIEW COMPARISON:  None Available. FINDINGS: The ankle mortise is symmetric and intact. Mild dorsal talonavicular degenerative osteophytosis. No acute fracture is seen. There is diffuse likely systemic soft tissue edema. There is superimposed mild to moderate lateral malleolar soft tissue swelling. IMPRESSION:  Mild-to-moderate lateral malleolar soft tissue swelling. No acute fracture is seen. Electronically Signed   By: Yvonne Kendall M.D.   On: 12/21/2021 14:47   DG Knee Left Port  Result Date: 12/21/2021 CLINICAL DATA:  Fall EXAM: PORTABLE LEFT KNEE - 1-2 VIEW COMPARISON:  None Available. FINDINGS: No evidence of fracture, dislocation, or joint effusion. Severe tricompartmental osteoarthritis of the left knee. Mild prepatellar soft tissue swelling. IMPRESSION: 1. No acute fracture or dislocation of the left knee. 2. Severe tricompartmental osteoarthritis. Electronically Signed   By: Davina Poke D.O.   On: 12/21/2021 14:45   CT HEAD WO CONTRAST  Result Date: 12/21/2021 CLINICAL DATA:  Head trauma, moderate-severe EXAM: CT HEAD WITHOUT CONTRAST TECHNIQUE: Contiguous axial images were obtained from the base of the skull through the vertex without intravenous contrast. RADIATION DOSE REDUCTION: This exam was performed according to the departmental dose-optimization program which includes automated exposure control, adjustment of the mA and/or kV according to patient size and/or use of iterative reconstruction technique. COMPARISON:   01/29/2017 FINDINGS: Brain: No evidence of acute infarction, hemorrhage, hydrocephalus, extra-axial collection or mass lesion/mass effect. Vascular: No hyperdense vessel or unexpected calcification. Skull: Normal. Negative for fracture or focal lesion. Sinuses/Orbits: No acute finding. IMPRESSION: No acute intracranial process. Electronically Signed   By: Sammie Bench M.D.   On: 12/21/2021 14:26    Procedures Procedures    Medications Ordered in ED Medications - No data to display  ED Course/ Medical Decision Making/ A&P                           Medical Decision Making Amount and/or Complexity of Data Reviewed Labs: ordered. Radiology: ordered. ECG/medicine tests: ordered.    76 year old female with medical history significant for osteoarthritis status post total knee replacement of the right knee, Chiari malformation, osteopenia, HTN, SVT, CHF, atrial fibrillation on Xarelto who presents to the emergency department after a mechanical fall.  The patient states that she was going down steps when her left knee "just gave out on me" and she was able to lower herself in a controlled fall down 3 steps.  She landed on her left side it is unsure if she hit her head.  She denies any loss of consciousness.  She is on Xarelto.  She denies any headaches, vision changes, any other injuries or complaints.  She arrived to the emergency department GCS 15, ABC intact.  On arrival, the patient was vitally stable.  ABC intact on primary survey, tenderness palpation of the lateral ankle on the left, mild knee tenderness.  CT imaging the head was performed revealed no acute intracranial abnormality, no evidence of intracranial hemorrhage.  X-ray imaging of the left ankle reveals lateral soft tissue swelling of the ankle consistent with likely ankle sprain.  Considered ligamentous injury.  X-ray of the left knee was performed revealed no acute fracture or dislocation.  An Ace wrap was placed along the  patient's left ankle and she was ambulated in the emergency department successfully. Stable for DC.  DC Instructions: Your CT imaging was without any traumatic injury to your brain, your x-ray imaging did not reveal any bony injury or dislocation of your left knee or ankle.  You do have ankle swelling about the left lateral ankle consistent with likely ankle sprain.  We will place you in an Ace wrap and have you follow-up outpatient with your PCP and sports medicine as needed.  Tylenol and Ibuprofen for pain  control, weightbearing as tolerated.   Final Clinical Impression(s) / ED Diagnoses Final diagnoses:  Fall, initial encounter  Sprain of left ankle, unspecified ligament, initial encounter    Rx / DC Orders ED Discharge Orders          Ordered    AMB referral to sports medicine        12/21/21 1512              Regan Lemming, MD 12/21/21 1515

## 2021-12-21 NOTE — ED Triage Notes (Signed)
Patient states that she lost her balance and fell. Did not lose consciousness and is not sure if she hit her head. She is on Xarelto (blood thinner)

## 2021-12-21 NOTE — ED Notes (Signed)
Patient's ankle wrapped with ace wrap and able to ambulate with assistance.

## 2021-12-26 ENCOUNTER — Ambulatory Visit: Payer: Medicare Other | Admitting: Physical Therapy

## 2021-12-30 ENCOUNTER — Encounter: Payer: Self-pay | Admitting: Internal Medicine

## 2021-12-30 ENCOUNTER — Ambulatory Visit: Payer: Medicare Other | Attending: Internal Medicine | Admitting: Internal Medicine

## 2021-12-30 VITALS — BP 136/84 | HR 81 | Ht 68.5 in | Wt 246.2 lb

## 2021-12-30 DIAGNOSIS — I5032 Chronic diastolic (congestive) heart failure: Secondary | ICD-10-CM | POA: Insufficient documentation

## 2021-12-30 DIAGNOSIS — I4819 Other persistent atrial fibrillation: Secondary | ICD-10-CM | POA: Diagnosis not present

## 2021-12-30 DIAGNOSIS — R000 Tachycardia, unspecified: Secondary | ICD-10-CM | POA: Insufficient documentation

## 2021-12-30 DIAGNOSIS — I471 Supraventricular tachycardia, unspecified: Secondary | ICD-10-CM | POA: Diagnosis not present

## 2021-12-30 DIAGNOSIS — Z79899 Other long term (current) drug therapy: Secondary | ICD-10-CM | POA: Diagnosis not present

## 2021-12-30 DIAGNOSIS — I4892 Unspecified atrial flutter: Secondary | ICD-10-CM | POA: Insufficient documentation

## 2021-12-30 MED ORDER — TORSEMIDE 20 MG PO TABS: 40.0000 mg | ORAL_TABLET | Freq: Every day | ORAL | 1 refills | Status: DC

## 2021-12-30 NOTE — Patient Instructions (Signed)
Medication Instructions:  Your physician has recommended you make the following change in your medication:   Increase your Torsemide '20mg'$  to 2 tablets by mouth daily.  *If you need a refill on your cardiac medications before your next appointment, please call your pharmacy*   Lab Work: BMET and Mg in 2 weeks  If you have labs (blood work) drawn today and your tests are completely normal, you will receive your results only by: [place] (if you have MyChart) OR A paper copy in the mail If you have any lab test that is abnormal or we need to change your treatment, we will call you to review the results.   Testing/Procedures: None ordered.    Follow-Up: At [facility], you and your health needs are our priority.  As part of our continuing mission to provide you with exceptional heart care, we have created designated Provider Care Teams.  These Care Teams include your primary Cardiologist (physician) and Advanced Practice Providers (APPs -  Physician Assistants and Nurse Practitioners) who all work together to provide you with the care you need, when you need it.  We recommend signing up for the patient portal called "MyChart".  Sign up information is provided on this After Visit Summary.  MyChart is used to connect with patients for Virtual Visits (Telemedicine).  Patients are able to view lab/test results, encounter notes, upcoming appointments, etc.  Non-urgent messages can be sent to your provider as well.   To learn more about what you can do with MyChart, go to [email or web address].    Your next appointment:   3 months with Dr Olin Pia [name]  Important Information About Sugar

## 2021-12-30 NOTE — Progress Notes (Signed)
Patient Care Team: [name], MD as PCP - General (Internal Medicine) Burnell Blanks, MD as PCP - Cardiology (Cardiology)   HPI  Bridget Cortez is a 76 y.o. female Seen in followup for [facility] which had ECG features of VT (concordance//aVR) but initiation and termination sequences suggestive of SVT--GT agreed more likely SVT; subsequently, 5/22, identified with atrial flutter >> atrial fib and started on anticoagulation. DCCV 7/22  Rx with amlodipine>> metoprolol      4th COVID Booster taken-Covid + April 2022. Post symptoms are shortness of breath.     Denies dyspnea.  Had some falls and saw orthopedics who was concerned about peripheral edema.  Denies excessive salt intake exuberant fluid intake.  No chest pain.  No palpitations   DATE TEST EF  8/16 Echo   55-65 %  7/19 Echo   55-60 %   Date Cr K Hgb  2/20 0.86 4.1 12.3  6/20  0.62 3.8   8/21 0.61 4.0 12.8    3/22 0.54 3.7 12.6  8/22 0.79 3.6 11.4  10/23   11.6   Anemia w/u ferritin 110, Fe Sat 22%; eval by Heme-Onc     Past Medical History:  Diagnosis Date   Abnormal Pap smear 2006   vaginal medicine according to patient   Allergy    Arthritis    Asthma    Atrophy of vagina 06/2005   Breast cancer ([place]) 1999   Left   CAP (community acquired pneumonia) 08/06/2014   Cataract    cataracts lt. eye    cataract removed.   Chiari malformation    Diverticulosis    Dysrhythmia    SVT   GERD (gastroesophageal reflux disease)    H/O osteopenia    08/2006   H/O varicella    Heart murmur    Hypertension    Monilial vaginitis 07/2007   Multiple allergies    Ovarian cyst    Personal history of colonic adenomas 03/13/2012   Personal history of radiation therapy 1990   Pneumonia 07/2014   VIN III (vulvar intraepithelial neoplasia III) 03/2009   Yeast infection     Past Surgical History:  Procedure Laterality Date   ABDOMINAL HYSTERECTOMY     ABDOMINAL SURGERY     BREAST EXCISIONAL BIOPSY  Right    benign   BREAST LUMPECTOMY Left 1990   BREAST SURGERY     Lumpectomy, left breast   CARDIOVERSION N/A 07/28/2020   Procedure: CARDIOVERSION;  Surgeon: Donato Heinz, MD;  Location: [facility];  Service: Cardiovascular;  Laterality: N/A;   CATARACT EXTRACTION Left 2018   COLONOSCOPY     COLONOSCOPY W/ BIOPSIES     EYE SURGERY     HERNIA REPAIR     KNEE SURGERY     right   lt. eye aneurysm surgery Left 2017   MOUTH SURGERY  2019   rt. side gum surgery  Lt. done 3 years ago   RADIOACTIVE SEED GUIDED EXCISIONAL BREAST BIOPSY Right 07/09/2018   Procedure: RADIOACTIVE SEED GUIDED EXCISIONAL RIGHT  BREAST BIOPSY;  Surgeon: Stark Vonette Grosso, MD;  Location: [facility];  Service: General;  Laterality: Right;   TOTAL KNEE ARTHROPLASTY Right 09/09/2019   Procedure: RIGHT TOTAL KNEE ARTHROPLASTY;  Surgeon: Melrose Nakayama, MD;  Location: [facility];  Service: Orthopedics;  Laterality: Right;    Current Meds  Medication Sig   acetaminophen (TYLENOL) 325 MG tablet Take 2 tablets (650 mg total) by mouth every 6 (six) hours  as needed for mild pain, fever or headache (or Fever >/= 101).   albuterol (PROVENTIL) (5 MG/ML) 0.5% nebulizer solution Take 0.5 mLs (2.5 mg total) by nebulization every 6 (six) hours as needed for wheezing or shortness of breath. (Patient taking differently: Take 2.5 mg by nebulization daily as needed for wheezing or shortness of breath.)   amLODipine (NORVASC) 10 MG tablet Take 5 mg by mouth at bedtime.    Calcium Carbonate-Vitamin D (CALTRATE 600+D PO) Take 1 tablet by mouth daily with lunch.    cetirizine (ZYRTEC) 10 MG tablet Take 10 mg by mouth daily.   esomeprazole (NEXIUM) 40 MG capsule Take 40 mg by mouth 2 (two) times daily before a meal.    fluticasone (FLONASE) 50 MCG/ACT nasal spray Place 1 spray into both nostrils at bedtime.    Fluticasone-Salmeterol (ADVAIR) 250-50 MCG/DOSE AEPB Inhale 1 puff into the lungs every 12 (twelve) hours.   Multiple Vitamin  (MULTIVITAMIN) tablet Take 1 tablet by mouth daily with lunch.    nebivolol (BYSTOLIC) 5 MG tablet Take 1 tablet (5 mg total) by mouth daily.   potassium chloride SA (KLOR-CON) 20 MEQ tablet Take 20 mEq by mouth 3 (three) times daily.   rivaroxaban (XARELTO) 20 MG TABS tablet TAKE 1 TABLET BY MOUTH ONCE DAILY WITH SUPPER   rosuvastatin (CRESTOR) 5 MG tablet Take 5 mg by mouth daily.   simethicone (MYLICON) 010 MG chewable tablet Chew 125 mg by mouth every 6 (six) hours as needed for flatulence.   telmisartan (MICARDIS) 40 MG tablet Take 40 mg by mouth daily.   torsemide (DEMADEX) 20 MG tablet Take 1 tablet by mouth once daily   traMADol (ULTRAM) 50 MG tablet Take 50 mg by mouth as needed.   traZODone (DESYREL) 50 MG tablet Take 25 mg by mouth at bedtime as needed for sleep.   triamcinolone cream (KENALOG) 0.1 % Apply 1 application  topically daily as needed (Mold).   vitamin C (ASCORBIC ACID) 500 MG tablet Take 500 mg by mouth daily.    Allergies  Allergen Reactions   Ephedrine Shortness Of Breath   Cortisone Other (See Comments)    Severe muscle spasms    Review of Systems negative except from HPI and PMH BP 136/84   Pulse 81   Ht 5' 8.5" (1.74 m)   Wt 246 lb 3.2 oz (111.7 kg)   SpO2 98%   BMI 36.89 kg/m   Well developed and Morbidly obese  in no acute distress HENT normal Neck supple with JVP-  flat   Clear Regular rate and rhythm, no murmurs or gallops Abd-soft with active BS No Clubbing cyanosis 2+ edema Skin-warm and dry A & Oriented  Grossly normal sensory and motor function  ECG sinus at 81 Intervals 18/10/39 Borderline LVH  Assessment and  Plan  WCT probably SVT  Atrial flutter-Atrial fib Dx 5/22   HTN  HFpEF chronic  Anxiety  Cold intolerance improved      Blood pressure remains well-controlled.  Continue her on amlodipine Bystolic 5 Micardis 40.  No bleeding, continue Xarelto 20 mg--will need to check a metabolic profile.  Will plan to do that in  2 weeks following up titration of her Demadex from 20--40 daily because of her excessive peripheral edema.  Encouraged her to decrease sodium and fluid intake.  Blood pressure is reasonably controlled.  Continue her on the Micardis and nebivolol and amlodipine.

## 2022-01-02 ENCOUNTER — Telehealth: Payer: Self-pay | Admitting: Internal Medicine

## 2022-01-02 ENCOUNTER — Ambulatory Visit: Payer: Medicare Other | Admitting: Physical Therapy

## 2022-01-02 NOTE — Telephone Encounter (Signed)
Pt c/o medication issue:  1. Name of Medication:  torsemide (DEMADEX) 20 MG tablet  2. How are you currently taking this medication (dosage and times per day)?    3. Are you having a reaction (difficulty breathing--STAT)?   4. What is your medication issue?   Patient states on Saturday she took 2 tablets and it caused severe cramping in the back of her thighs, down in her toes, and up into her fingers. She states the cramping became so bad that she was unable to sleep. Yesterday she didn't take any Torsemide at all due to how terrible she felt the day prior. Patient states that going forward she plans to only take 1 tablet daily.

## 2022-01-03 NOTE — Telephone Encounter (Signed)
Spoke with pt who reports increase leg, arm, feet and hand cramps with the increased Torsemide.  Pt reports she has returned to taking Torsemide '20mg'$  daily and has had no further problems with cramping. Pt advised will forward to Dr Caryl Comes to make him aware.  Pt verbalizes understanding and thanked Therapist, sports for the phone call.

## 2022-01-04 ENCOUNTER — Ambulatory Visit: Payer: Medicare Other | Attending: Orthopaedic Surgery | Admitting: Physical Therapy

## 2022-01-04 DIAGNOSIS — M25562 Pain in left knee: Secondary | ICD-10-CM | POA: Insufficient documentation

## 2022-01-04 DIAGNOSIS — R279 Unspecified lack of coordination: Secondary | ICD-10-CM | POA: Diagnosis not present

## 2022-01-04 DIAGNOSIS — G8929 Other chronic pain: Secondary | ICD-10-CM | POA: Diagnosis not present

## 2022-01-04 DIAGNOSIS — M6281 Muscle weakness (generalized): Secondary | ICD-10-CM | POA: Insufficient documentation

## 2022-01-04 DIAGNOSIS — M1712 Unilateral primary osteoarthritis, left knee: Secondary | ICD-10-CM | POA: Diagnosis not present

## 2022-01-04 DIAGNOSIS — R2689 Other abnormalities of gait and mobility: Secondary | ICD-10-CM | POA: Diagnosis not present

## 2022-01-05 ENCOUNTER — Encounter: Payer: Self-pay | Admitting: Physical Therapy

## 2022-01-05 NOTE — Therapy (Signed)
OUTPATIENT PHYSICAL THERAPY LOWER EXTREMITY TREATMENT NOTE (AQUATIC THERAPY)   Patient Name: Bridget Cortez MRN: [id number] DOB:February 10, 1945, 76 y.o., female Today's Date: 01/05/2022   PT End of Session - 01/05/22 0955     Visit Number 18    Date for PT Re-Evaluation 01/04/22    Authorization Type Medicare    PT Start Time 1415    PT Stop Time 1500    PT Time Calculation (min) 45 min    Equipment Utilized During Treatment Other (comment)   large bar bell   Activity Tolerance Patient tolerated treatment well    Behavior During Therapy [facility] for tasks assessed/performed              Past Medical History:  Diagnosis Date   Abnormal Pap smear 2006   vaginal medicine according to patient   Allergy    Arthritis    Asthma    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 eye    cataract removed.   Chiari malformation    Diverticulosis    Dysrhythmia    SVT   GERD (gastroesophageal reflux disease)    H/O osteopenia    08/2006   H/O varicella    Heart murmur    Hypertension    Monilial vaginitis 07/2007   Multiple allergies    Ovarian cyst    Personal history of colonic adenomas 03/13/2012   Personal history of radiation therapy 1990   Pneumonia 07/2014   VIN III (vulvar intraepithelial neoplasia III) 03/2009   Yeast infection    Past Surgical History:  Procedure Laterality Date   ABDOMINAL HYSTERECTOMY     ABDOMINAL SURGERY     BREAST EXCISIONAL BIOPSY Right    benign   BREAST LUMPECTOMY Left 1990   BREAST SURGERY     Lumpectomy, left breast   CARDIOVERSION N/A 07/28/2020   Procedure: CARDIOVERSION;  Surgeon: Donato Heinz, MD;  Location: [place];  Service: Cardiovascular;  Laterality: N/A;   CATARACT EXTRACTION Left 2018   COLONOSCOPY     COLONOSCOPY W/ BIOPSIES     EYE SURGERY     HERNIA REPAIR     KNEE SURGERY     right   lt. eye aneurysm surgery  Left 2017   MOUTH SURGERY  2019   rt. side gum surgery  Lt. done 3 years ago   RADIOACTIVE SEED GUIDED EXCISIONAL BREAST BIOPSY Right 07/09/2018   Procedure: RADIOACTIVE SEED GUIDED EXCISIONAL RIGHT  BREAST BIOPSY;  Surgeon: Stark Klein, MD;  Location: [place];  Service: General;  Laterality: Right;   TOTAL KNEE ARTHROPLASTY Right 09/09/2019   Procedure: RIGHT TOTAL KNEE ARTHROPLASTY;  Surgeon: Melrose Nakayama, MD;  Location: [facility];  Service: Orthopedics;  Laterality: Right;   Patient Active Problem List   Diagnosis Date Noted   Leukocytopenia 09/15/2020   Deficiency anemia 09/15/2020   Persistent atrial fibrillation (HCC)    Primary osteoarthritis of right knee 09/09/2019   (HFpEF) heart failure with preserved ejection fraction ([place]) 04/13/2019   SVT (supraventricular tachycardia) 08/04/2017   Wide-complex tachycardia 08/03/2017   Asthma 08/06/2014   Anxiety about health 08/06/2014   Essential hypertension 08/06/2014   Personal history of colonic adenomas 03/20/2012   Chiari malformation    Heart murmur    H/O varicella    Yeast infection    Cancer ([place])    H/O osteopenia  Abnormal Pap smear    Vulvar intraepithelial neoplasia III (VIN III) 04/12/2009   Atrophy of vagina 06/23/2005    PCP: [name]  REFERRING PROVIDER: Melrose Nakayama  REFERRING DIAG: L knee pain sp R TKA in 2021  THERAPY DIAG:  Chronic pain of left knee  Other abnormalities of gait and mobility  Muscle weakness (generalized)  Rationale for Evaluation and Treatment Rehabilitation  ONSET DATE: 09/13/21  SUBJECTIVE:   SUBJECTIVE STATEMENT: Pt states she saw Dr. Latanya Maudlin this morning due to recent Rt knee injury - presents with new order for PT with diagnosis of Rt hamstring strain.  Pt reports no pain at start of aquatic PT session due to having taken Tylenol earlier this morning  (Tylenol 650 Arthritis strength)   PERTINENT HISTORY: R TKA, heart failure, edema in ankles, HTN, Chiari  malformation   PAIN:  Are you having pain? Yes: NPRS scale: 0/10 Pain location: Rt knee Pain description: dull, achy  Aggravating factors: standing and walking for too long  :  fall sustained on evening of 10-23-21 caused the pain in Rt knee  Relieving factors: rubbing it with alcohol, Voltaren   PRECAUTIONS: Fall  WEIGHT BEARING RESTRICTIONS No  FALLS:  Has patient fallen in last 6 months? Yes. Number of falls 1  LIVING ENVIRONMENT: Lives with: lives with their spouse Lives in: House/apartment Stairs: Yes: External: 3 steps; on right going up Has following equipment at home: Single point cane and Walker - 2 wheeled  OCCUPATION: retired  PLOF: Independent  PATIENT GOALS to be able to stand and fix my posture    OBJECTIVE:    01-05-22:  Aquatic therapy at [facility] 90 degrees; pt using SPC  for assistance with amb. But used facility's wheelchair to access pool area with assistance from [facility] staff from front desk due to prolonged distance  Patient seen for aquatic therapy today.  Treatment took place in water 3.6-4.0 feet deep depending upon activity.  Pt entered and exited  the pool via step negotiation with use of bilateral hand rails using step by step sequence with CGA.  Pt performed gentle water walking for warm up 18' x 4 reps forwards across pool holding onto large bar bell with min assist from PT;  sideways 4 reps (18' x 4) with UE support on bar bell, and backwards 18' x 2 reps with UE support on bar bell  Pt used large single bar bell for UE support with min assist for stabilization and for increased confidence and security  Sidestepping with UE support on large bar bell with mod assist for stabilization - 18' x 4 reps  Pt performed seated LAQ's RLE (sitting on pool bench) - pt performed active Rt knee extension/flexion 2 sets 10 reps  Pt performed small range squats 10 reps with UE support on large bar bell   Pt requires buoyancy of water for  support for reduced fall risk and also for joint offloading to decrease stress and strain on knees with weight bearing; buoyancy resisted exercises provide resistance as well as the viscosity of water providing resistance for strengthening; buoyancy assisted exercises provide increased active assisted ROM.  Water current provides perturbations for improved static & dynamic standing balance and for improved trunk/core stabilization   PATIENT EDUCATION:  Education details: benefits of aquatic therapy Person educated: Patient Education method: Explanation Education comprehension: verbalized understanding   HOME EXERCISE PROGRAM: TBD  ASSESSMENT:  CLINICAL IMPRESSION: Aquatic therapy session focused on water walking in various directions  and Rt knee flexion for gentle ROM for hamstring strengthening in both open and closed chain position due to new order/diagnosis of Rt hamstring strain.  Pt reported no pain in Rt hamstring during aquatic session - reported RLE felt better at end of session than it did at start of session.  Cont with POC.  OBJECTIVE IMPAIRMENTS Abnormal gait, decreased balance, decreased mobility, difficulty walking, decreased ROM, decreased strength, decreased safety awareness, impaired UE functional use, improper body mechanics, obesity, and pain.   ACTIVITY LIMITATIONS carrying, lifting, bending, sitting, standing, squatting, stairs, transfers, and locomotion level  PARTICIPATION LIMITATIONS: meal prep, cleaning, laundry, shopping, and community activity  PERSONAL FACTORS Age, Behavior pattern, Time since onset of injury/illness/exacerbation, and 1-2 comorbidities: obesity, Chiari malformation, heart murmur   are also affecting patient's functional outcome.   REHAB POTENTIAL: Fair    CLINICAL DECISION MAKING: Evolving/moderate complexity  EVALUATION COMPLEXITY: Low   GOALS:  Goals reviewed with patient? Yes  SHORT TERM GOALS: Target date: 11/03/21  Patient will  be independent with initial HEP. Goal status: INITIAL     LONG TERM GOALS: Target date: 01/04/22  Patient will be independent with advanced/ongoing HEP to improve outcomes and carryover.  Goal status: ongoing  2.  Patient will report at least 75% improvement in L knee pain to improve QOL. Baseline: 7/10, 11/28/21 up to 8/10 Goal status: ongoing   3.  Patient will demonstrate improved functional LE strength as demonstrated by 4/5 in BLE. Baseline: 3+/5, 11/6-3+/5 Goal status: ongoing  5.  Patient will be able to ambulate 300' with LRAD and normal gait pattern without increased pain to access community.  Baseline: walking w/SPC, 11/6- Able to walk in from parking lot, but then fatigued, approximately 100' Goal status: on going- fwd flexed with SPC and very antalgic 11/11/21  6. Patient will be able to ascend/descend stairs with 1 HR and reciprocal step pattern safely to access home and community.  Goal status: Requires handrail  and step to gait pattern  7.  Patient will report 96 on FOTO (patient reported outcome measure) to demonstrate improved functional ability. Baseline: 38, 11/6-37 Goal status: ongoing  PLAN:  PT FREQUENCY: 1-2x/week  PT DURATION: 10 weeks  PLANNED INTERVENTIONS: Therapeutic exercises, Therapeutic activity, Neuromuscular re-education, Balance training, Gait training, Patient/Family education, Self Care, Joint mobilization, Stair training, Electrical stimulation, Cryotherapy, Moist heat, Taping, Vasopneumatic device, Traction, Ionotophoresis '4mg'$ /ml Dexamethasone, and Manual therapy  PLAN FOR NEXT SESSION:   Renew next land session - cont low level LE strengthening, gait training, Nustep, balance     Guido Sander, PT 01/05/2022, 9:57 AM  [facility] [address], [place], [place] [address] [phone]

## 2022-01-09 ENCOUNTER — Encounter: Payer: Self-pay | Admitting: Physical Therapy

## 2022-01-09 ENCOUNTER — Ambulatory Visit: Payer: Medicare Other | Admitting: Physical Therapy

## 2022-01-09 DIAGNOSIS — G8929 Other chronic pain: Secondary | ICD-10-CM

## 2022-01-09 DIAGNOSIS — M25562 Pain in left knee: Secondary | ICD-10-CM | POA: Diagnosis not present

## 2022-01-09 DIAGNOSIS — M6281 Muscle weakness (generalized): Secondary | ICD-10-CM | POA: Diagnosis not present

## 2022-01-09 DIAGNOSIS — R279 Unspecified lack of coordination: Secondary | ICD-10-CM | POA: Diagnosis not present

## 2022-01-09 DIAGNOSIS — R2689 Other abnormalities of gait and mobility: Secondary | ICD-10-CM

## 2022-01-09 NOTE — Therapy (Signed)
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 eye    cataract removed.   Chiari malformation    Diverticulosis    Dysrhythmia    SVT   GERD (gastroesophageal reflux disease)    H/O osteopenia    08/2006   H/O varicella    Heart murmur    Hypertension    Monilial vaginitis 07/2007   Multiple allergies    Ovarian cyst    Personal history of colonic adenomas 03/13/2012   Personal history of radiation therapy 1990   Pneumonia 07/2014   VIN III (vulvar intraepithelial neoplasia III) 03/2009   Yeast infection    Past Surgical History:  Procedure Laterality Date   ABDOMINAL HYSTERECTOMY     ABDOMINAL SURGERY     BREAST EXCISIONAL BIOPSY Right    benign   BREAST LUMPECTOMY Left 1990   BREAST SURGERY     Lumpectomy, left breast   CARDIOVERSION N/A 07/28/2020   Procedure: CARDIOVERSION;  Surgeon: Donato Heinz, MD;  Location: [place];  Service: Cardiovascular;  Laterality: N/A;   CATARACT EXTRACTION Left 2018   COLONOSCOPY     COLONOSCOPY W/ BIOPSIES     EYE SURGERY     HERNIA REPAIR     KNEE SURGERY     right   lt. eye aneurysm surgery Left 2017   MOUTH SURGERY  2019   rt. side gum surgery  Lt. done 3 years ago   RADIOACTIVE SEED GUIDED EXCISIONAL BREAST BIOPSY Right 07/09/2018   Procedure: RADIOACTIVE SEED GUIDED EXCISIONAL RIGHT  BREAST BIOPSY;  Surgeon: Stark Klein, MD;  Location: [place];  Service: General;  Laterality: Right;   TOTAL KNEE ARTHROPLASTY Right 09/09/2019   Procedure: RIGHT TOTAL KNEE ARTHROPLASTY;  Surgeon: Melrose Nakayama, MD;  Location:  [facility];  Service: Orthopedics;  Laterality: Right;   Patient Active Problem List   Diagnosis Date Noted   Leukocytopenia 09/15/2020   Deficiency anemia 09/15/2020   Persistent atrial fibrillation (HCC)    Primary osteoarthritis of right knee 09/09/2019   (HFpEF) heart failure with preserved ejection fraction ([place]) 04/13/2019   SVT (supraventricular tachycardia) 08/04/2017   Wide-complex tachycardia 08/03/2017   Asthma 08/06/2014   Anxiety about health 08/06/2014   Essential hypertension 08/06/2014   Personal history of colonic adenomas 03/20/2012   Chiari malformation    Heart murmur    H/O varicella    Yeast infection    Cancer ([place])    H/O osteopenia    Abnormal Pap smear    Vulvar intraepithelial neoplasia III (VIN III) 04/12/2009   Atrophy of vagina 06/23/2005    PCP: Steva Ready Avva  REFERRING PROVIDER: Melrose Nakayama  REFERRING DIAG: L knee pain sp R TKA in 2021  THERAPY DIAG:  No diagnosis found.  Rationale for Evaluation and Treatment Rehabilitation  ONSET DATE: 09/13/21  SUBJECTIVE:   SUBJECTIVE STATEMENT:  Patient reports she fell while climbing the steps at home. She also strained her hamstring while trying to bring the trash can into the house.   PERTINENT  HISTORY: R TKA, heart failure, edema in ankles, HTN, Chiari malformation   PAIN:  Are you having pain? Yes: NPRS scale: 0/10 Pain location: L knee Pain description: dull, achy  Aggravating factors: standing and walking for too long  Relieving factors: rubbing it with alcohol, Voltaren   PRECAUTIONS: Fall  WEIGHT BEARING RESTRICTIONS No  FALLS:  Has patient fallen in last 6 months? Yes. Number of falls 1  LIVING ENVIRONMENT: Lives with: lives with their spouse Lives in: House/apartment Stairs: Yes: External: 3 steps; on right going up Has following equipment at home: Single point cane and Walker - 2 wheeled  OCCUPATION: retired  PLOF: Independent  PATIENT GOALS to be able to stand and  fix my posture    OBJECTIVE:   PATIENT SURVEYS:  FOTO 38/100  COGNITION:  Overall cognitive status: Within functional limits for tasks assessed     SENSATION: WFL   POSTURE: rounded shoulders, forward head, increased thoracic kyphosis, and flexed trunk    LOWER EXTREMITY ROM:  WFL    LOWER EXTREMITY MMT:  MMT Right eval Left eval R/L 11/11/21  Hip flexion 3+ 3+ 4/4  Hip extension     Hip abduction 4- 4- 4/4  Hip adduction 4- 4- 4/4  Hip internal rotation     Hip external rotation     Knee flexion 3+ 3+ 4+/4+  Knee extension 3+ 3+ 4/4- with pain  Ankle dorsiflexion     Ankle plantarflexion     Ankle inversion     Ankle eversion      (Blank rows = not tested)   FUNCTIONAL TESTS:  5 times sit to stand: 11/11/21 32.54 sec from mat without UE Timed up and go (TUG): 45.53s  11/11/21 35.67 with SPC  GAIT: Distance walked: in clinic distances Assistive device utilized: Single point cane Level of assistance: Modified independence Comments: slowed gait, unsteady, decreased step length, decreased arm swing  TODAY'S TREATMENT: 01/09/22 Functional re-assessment for UPOC Resisted knee flexion against Green Tband, 10 reps each leg. Sit to stand x 10 from elevated mat. Mini squats x 10 reps  12/20/21 NuStep L5 x 6 min  S2S elevated mat 3x5  Side stepping R and L, 3x 5 steps each way. Hamstring curls green 2x10 LAQ 2x10 no weight 4in box taps 2x5 each   12/05/21 NuStep L5 x 6 minutes Seated flutter kick x 30 seconds. Mini squat x 10 to elevated mat. Side stepping R and L, 4 x 5 steps each way. Ambulation 3 x 50' with cane and MI, improved step through gait. VASO to L knee 10 min, low press, 34 degrees.  11/28/21 NuStep L4 x 6 minutes Re-assessment, see goals Mini squats x 10 Seated long kicks, flutter kick x 30 sec, heel rais/toe raise, 10 reps each Vaso to L knee x 10 minutes, low pressure, 34 degrees   11/21/21 NuStep L3 x 6 minutes Seated long  kicks, no weight, 2 x 10 each leg Seated step taps on 4" step L,2" step, R 2 x 10 each leg Seated clamshells against G Tband, 2 x 10 reps Vaso to R knee for pain, 34 degrees, low pressure, 10 minutes.  11/14/21 LAQ 3# 2x10 Marching 3# 2x10 Standing hip abd 3# 3x5 Seated clamshells greenTB 2x10 S2S 2x5, second set with red ball push out  Ball squeezes 2x10    11/11/21 Nustep L 5 76mn LE only Green tband HS curl 2 sets STS with wt ball chest press to encourage upright trunk 2 sets  5 3# LAQ 2 sets 10 3# hip flex and abd seated stepping up onto 6 inch box 10x BIL Green tband clams 10 x BIL , 10 x SL Green tband hip flex 2 sets 10  PATIENT EDUCATION:  Education details: POC Person educated: Patient Education method: Explanation Education comprehension: verbalized understanding   HOME EXERCISE PROGRAM:  [id number]  ASSESSMENT:  CLINICAL IMPRESSION: Patient reports a fall and HS strain with new Dr order for PT services. Re-assessed for [facility]. Patient has progressed in spite of her setbacks and will benefit from continued PT.    OBJECTIVE IMPAIRMENTS Abnormal gait, decreased balance, decreased mobility, difficulty walking, decreased ROM, decreased strength, decreased safety awareness, impaired UE functional use, improper body mechanics, obesity, and pain.   ACTIVITY LIMITATIONS carrying, lifting, bending, sitting, standing, squatting, stairs, transfers, and locomotion level  PARTICIPATION LIMITATIONS: meal prep, cleaning, laundry, shopping, and community activity  PERSONAL FACTORS Age, Behavior pattern, Time since onset of injury/illness/exacerbation, and 1-2 comorbidities: obesity, Chiari malformation, heart murmur  are also affecting patient's functional outcome.   REHAB POTENTIAL: Fair   CLINICAL DECISION MAKING: Evolving/moderate complexity  EVALUATION COMPLEXITY: Low   GOALS: Goals reviewed with patient? Yes  SHORT TERM GOALS: Target date: 11/03/21  Patient will  be independent with initial HEP. Goal status: met 11/11/21   LONG TERM GOALS: Target date: 01/04/22  Patient will be independent with advanced/ongoing HEP to improve outcomes and carryover.  Goal status: ongoing  2.  Patient will report at least 75% improvement in L knee pain to improve QOL. Baseline: 7/10, 11/28/21 up to 8/10, 12/18- 7/10 Goal status: ongoing   3.  Patient will demonstrate improved functional LE strength as demonstrated by 4/5 in BLE. Baseline: 3+/5, 11/6-3+/5, 12/18-(4-)-3+/5 Goal status: ongoing  5.  Patient will be able to ambulate 300' with LRAD and normal gait pattern without increased pain to access community.  Baseline: walking w/SPC, 11/6- Able to walk in from parking lot, but then fatigued, approximately 100', 12/18-100' with cane, fatigued. Goal status: on going-   6. Patient will be able to ascend/descend stairs with 1 HR and reciprocal step pattern safely to access home and community.   Baseline: 12/18-Had a fall attempting to climb steps. Goal status: ongoing  7.  Patient will report 67 on FOTO (patient reported outcome measure) to demonstrate improved functional ability. Baseline: 38, 11/6-37, 12/18-35 Goal status: ongoing   PLAN: PT FREQUENCY: 1-2x/week  PT DURATION: 10 weeks  PLANNED INTERVENTIONS: Therapeutic exercises, Therapeutic activity, Neuromuscular re-education, Balance training, Gait training, Patient/Family education, Self Care, Joint mobilization, Stair training, Electrical stimulation, Cryotherapy, Moist heat, Taping, Vasopneumatic device, Traction, Ionotophoresis 4m/ml Dexamethasone, and Manual therapy, aquatic therapy  PLAN FOR NEXT SESSION: Re-assess HEP. What did Dr DLatanya Maudlinsay?  SEthel RanaDPT 01/09/22 11:49 AM

## 2022-01-10 ENCOUNTER — Telehealth: Payer: Self-pay

## 2022-01-10 NOTE — Telephone Encounter (Signed)
     Patient  visit on 12/21/2021  at [facility] was for fall.  Have you been able to follow up with your primary care physician? Patient followed up with Dr. Lajuana Ripple.  The patient was or was not able to obtain any needed medicine or equipment. No medications prescribed.  Are there diet recommendations that you are having difficulty following? No diet recommendations.  Patient expresses understanding of discharge instructions and education provided has no other needs at this time.    [facility] Resource Care Guide   [email or web address]  ?? [phone]   Website: [email or web address]

## 2022-01-13 ENCOUNTER — Ambulatory Visit: Payer: Medicare Other | Attending: Internal Medicine

## 2022-01-13 DIAGNOSIS — R000 Tachycardia, unspecified: Secondary | ICD-10-CM | POA: Diagnosis not present

## 2022-01-13 DIAGNOSIS — I4892 Unspecified atrial flutter: Secondary | ICD-10-CM

## 2022-01-13 DIAGNOSIS — I471 Supraventricular tachycardia, unspecified: Secondary | ICD-10-CM | POA: Diagnosis not present

## 2022-01-13 DIAGNOSIS — Z79899 Other long term (current) drug therapy: Secondary | ICD-10-CM

## 2022-01-13 DIAGNOSIS — I5032 Chronic diastolic (congestive) heart failure: Secondary | ICD-10-CM | POA: Diagnosis not present

## 2022-01-13 DIAGNOSIS — I4819 Other persistent atrial fibrillation: Secondary | ICD-10-CM | POA: Diagnosis not present

## 2022-01-13 NOTE — Telephone Encounter (Signed)
Attempted phone call to pt and advised pt of Dr Olin Pia recommendation as below.  Pt may call [phone] for further questions or concerns.

## 2022-01-13 NOTE — Telephone Encounter (Signed)
She could try taking it every other day and see if she tolerates the higher dose that way Thanks SK

## 2022-01-14 LAB — BASIC METABOLIC PANEL
BUN/Creatinine Ratio: 12 (ref 12–28)
BUN: 9 mg/dL (ref 8–27)
CO2: 24 mmol/L (ref 20–29)
Calcium: 9.2 mg/dL (ref 8.7–10.3)
Chloride: 102 mmol/L (ref 96–106)
Creatinine, Ser: 0.73 mg/dL (ref 0.57–1.00)
Glucose: 82 mg/dL (ref 70–99)
Potassium: 3.8 mmol/L (ref 3.5–5.2)
Sodium: 142 mmol/L (ref 134–144)
eGFR: 85 mL/min/{1.73_m2} (ref >59)

## 2022-01-14 LAB — MAGNESIUM: Magnesium: 1.7 mg/dL (ref 1.6–2.3)

## 2022-01-18 ENCOUNTER — Ambulatory Visit: Payer: Medicare Other | Admitting: Physical Therapy

## 2022-01-25 ENCOUNTER — Ambulatory Visit
Admission: RE | Admit: 2022-01-25 | Discharge: 2022-01-25 | Disposition: A | Discharge status: Home / Self Care | Payer: Medicare Other | Source: Ambulatory Visit | Attending: Internal Medicine | Admitting: Internal Medicine

## 2022-01-25 DIAGNOSIS — Z1231 Encounter for screening mammogram for malignant neoplasm of breast: Secondary | ICD-10-CM

## 2022-01-26 ENCOUNTER — Encounter: Payer: Self-pay | Admitting: Physical Therapy

## 2022-01-26 ENCOUNTER — Ambulatory Visit: Payer: Medicare Other | Attending: Orthopaedic Surgery | Admitting: Physical Therapy

## 2022-01-26 DIAGNOSIS — M6281 Muscle weakness (generalized): Secondary | ICD-10-CM | POA: Insufficient documentation

## 2022-01-26 DIAGNOSIS — R2689 Other abnormalities of gait and mobility: Secondary | ICD-10-CM | POA: Diagnosis not present

## 2022-01-26 DIAGNOSIS — G8929 Other chronic pain: Secondary | ICD-10-CM | POA: Diagnosis not present

## 2022-01-26 DIAGNOSIS — M25562 Pain in left knee: Secondary | ICD-10-CM | POA: Insufficient documentation

## 2022-01-26 DIAGNOSIS — R279 Unspecified lack of coordination: Secondary | ICD-10-CM | POA: Insufficient documentation

## 2022-01-26 NOTE — Therapy (Signed)
OUTPATIENT PHYSICAL THERAPY LOWER EXTREMITY Treatment    Patient Name: Bridget Cortez MRN: [id number] DOB:22-Apr-1945, 77 y.o., female Today's Date: 11/14/2021    Progress Note Reporting Period 11/14/21 to 01/26/22  See note below for Objective Data and Assessment of Progress/Goals.       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 eye    cataract removed.   Chiari malformation    Diverticulosis    Dysrhythmia    SVT   GERD (gastroesophageal reflux disease)    H/O osteopenia    08/2006   H/O varicella    Heart murmur    Hypertension    Monilial vaginitis 07/2007   Multiple allergies    Ovarian cyst    Personal history of colonic adenomas 03/13/2012   Personal history of radiation therapy 1990   Pneumonia 07/2014   VIN III (vulvar intraepithelial neoplasia III) 03/2009   Yeast infection    Past Surgical History:  Procedure Laterality Date   ABDOMINAL HYSTERECTOMY     ABDOMINAL SURGERY     BREAST EXCISIONAL BIOPSY Right    benign   BREAST LUMPECTOMY Left 1990   BREAST SURGERY     Lumpectomy, left breast   CARDIOVERSION N/A 07/28/2020   Procedure: CARDIOVERSION;  Surgeon: Donato Heinz, MD;  Location: [facility];  Service: Cardiovascular;  Laterality: N/A;   CATARACT EXTRACTION Left 2018   COLONOSCOPY     COLONOSCOPY W/ BIOPSIES     EYE SURGERY     HERNIA REPAIR     KNEE SURGERY     right   lt. eye aneurysm surgery Left 2017   MOUTH SURGERY  2019   rt. side gum surgery  Lt. done 3 years ago   RADIOACTIVE SEED GUIDED EXCISIONAL BREAST BIOPSY Right 07/09/2018   Procedure: RADIOACTIVE SEED GUIDED EXCISIONAL RIGHT  BREAST BIOPSY;  Surgeon: Stark Klein, MD;  Location: [facility];  Service: General;  Laterality: Right;    TOTAL KNEE ARTHROPLASTY Right 09/09/2019   Procedure: RIGHT TOTAL KNEE ARTHROPLASTY;  Surgeon: Melrose Nakayama, MD;  Location: [facility];  Service: Orthopedics;  Laterality: Right;   Patient Active Problem List   Diagnosis Date Noted   Leukocytopenia 09/15/2020   Deficiency anemia 09/15/2020   Persistent atrial fibrillation (HCC)    Primary osteoarthritis of right knee 09/09/2019   (HFpEF) heart failure with preserved ejection fraction ([place]) 04/13/2019   SVT (supraventricular tachycardia) 08/04/2017   Wide-complex tachycardia 08/03/2017   Asthma 08/06/2014   Anxiety about health 08/06/2014   Essential hypertension 08/06/2014   Personal history of colonic adenomas 03/20/2012   Chiari malformation    Heart murmur    H/O varicella    Yeast infection    Cancer ([place])    H/O osteopenia    Abnormal Pap smear    Vulvar intraepithelial neoplasia III (VIN III) 04/12/2009   Atrophy of vagina 06/23/2005    PCP: Steva Ready Avva  REFERRING PROVIDER: Melrose Nakayama  REFERRING DIAG: L knee pain sp R TKA in 2021  THERAPY DIAG:  No diagnosis found.  Rationale for Evaluation and Treatment Rehabilitation  ONSET DATE: 09/13/21  SUBJECTIVE:   SUBJECTIVE STATEMENT:  Patient reports no more falls. L knee  is sore today, R knee is ok. She feels her HEP is still working for her.   PERTINENT HISTORY: R TKA, heart failure, edema in ankles, HTN, Chiari malformation   PAIN:  Are you having pain? Yes: NPRS scale: 0/10 Pain location: L knee Pain description: dull, achy  Aggravating factors: standing and walking for too long  Relieving factors: rubbing it with alcohol, Voltaren   PRECAUTIONS: Fall  WEIGHT BEARING RESTRICTIONS No  FALLS:  Has patient fallen in last 6 months? Yes. Number of falls 1  LIVING ENVIRONMENT: Lives with: lives with their spouse Lives in: House/apartment Stairs: Yes: External: 3 steps; on right going up Has following equipment at home: Single point cane and Walker -  2 wheeled  OCCUPATION: retired  PLOF: Independent  PATIENT GOALS to be able to stand and fix my posture    OBJECTIVE:   PATIENT SURVEYS:  FOTO 38/100  COGNITION:  Overall cognitive status: Within functional limits for tasks assessed     SENSATION: WFL   POSTURE: rounded shoulders, forward head, increased thoracic kyphosis, and flexed trunk    LOWER EXTREMITY ROM:  WFL    LOWER EXTREMITY MMT:  MMT Right eval Left eval R/L 11/11/21  Hip flexion 3+ 3+ 4/4  Hip extension     Hip abduction 4- 4- 4/4  Hip adduction 4- 4- 4/4  Hip internal rotation     Hip external rotation     Knee flexion 3+ 3+ 4+/4+  Knee extension 3+ 3+ 4/4- with pain  Ankle dorsiflexion     Ankle plantarflexion     Ankle inversion     Ankle eversion      (Blank rows = not tested)   FUNCTIONAL TESTS:  5 times sit to stand: 11/11/21 32.54 sec from mat without UE Timed up and go (TUG): 45.53s  11/11/21 35.67 with SPC  GAIT: Distance walked: in clinic distances Assistive device utilized: Single point cane Level of assistance: Modified independence Comments: slowed gait, unsteady, decreased step length, decreased arm swing  TODAY'S TREATMENT: 01/26/22 NuStep L3 x 6 minutes. Sit to stand x 5 on airex pad, from elevated mat, CGA, fearful Standing march with BUE support on RW, VC for upright posture, 10 reps each leg,  Standing hip abd 10 reps each side Standing heel raises/toe raises, 2 x 5 each Knee flexion in stand with BUE support, 2 x 5 each.  01/09/22 Functional re-assessment for UPOC Resisted knee flexion against Green Tband, 10 reps each leg. Sit to stand x 10 from elevated mat. Mini squats x 10 reps  12/20/21 NuStep L5 x 6 min  S2S elevated mat 3x5  Side stepping R and L, 3x 5 steps each way. Hamstring curls green 2x10 LAQ 2x10 no weight 4in box taps 2x5 each   12/05/21 NuStep L5 x 6 minutes Seated flutter kick x 30 seconds. Mini squat x 10 to elevated mat. Side  stepping R and L, 4 x 5 steps each way. Ambulation 3 x 50' with cane and MI, improved step through gait. VASO to L knee 10 min, low press, 34 degrees.  11/28/21 NuStep L4 x 6 minutes Re-assessment, see goals Mini squats x 10 Seated long kicks, flutter kick x 30 sec, heel rais/toe raise, 10 reps each Vaso to L knee x 10 minutes, low pressure, 34 degrees   11/21/21 NuStep L3 x 6 minutes Seated long kicks, no weight, 2 x 10 each leg Seated step taps on 4" step L,2" step, R 2 x 10  each leg Seated clamshells against G Tband, 2 x 10 reps Vaso to R knee for pain, 34 degrees, low pressure, 10 minutes.  11/14/21 LAQ 3# 2x10 Marching 3# 2x10 Standing hip abd 3# 3x5 Seated clamshells greenTB 2x10 S2S 2x5, second set with red ball push out  Ball squeezes 2x10    11/11/21 Nustep L 5 77mn LE only Green tband HS curl 2 sets STS with wt ball chest press to encourage upright trunk 2 sets 5 3# LAQ 2 sets 10 3# hip flex and abd seated stepping up onto 6 inch box 10x BIL Green tband clams 10 x BIL , 10 x SL Green tband hip flex 2 sets 10  PATIENT EDUCATION:  Education details: POC Person educated: Patient Education method: Explanation Education comprehension: verbalized understanding   HOME EXERCISE PROGRAM:  [id number] ASSESSMENT:  CLINICAL IMPRESSION: Patient reports no additional falls. Her L knee is sore today. Functional status re-assessed last visit for [facility] She tolerated increased activity, performing exercises in standing. HEP updated   OBJECTIVE IMPAIRMENTS Abnormal gait, decreased balance, decreased mobility, difficulty walking, decreased ROM, decreased strength, decreased safety awareness, impaired UE functional use, improper body mechanics, obesity, and pain.   ACTIVITY LIMITATIONS carrying, lifting, bending, sitting, standing, squatting, stairs, transfers, and locomotion level  PARTICIPATION LIMITATIONS: meal prep, cleaning, laundry, shopping, and community  activity  PERSONAL FACTORS Age, Behavior pattern, Time since onset of injury/illness/exacerbation, and 1-2 comorbidities: obesity, Chiari malformation, heart murmur  are also affecting patient's functional outcome.   REHAB POTENTIAL: Fair   CLINICAL DECISION MAKING: Evolving/moderate complexity  EVALUATION COMPLEXITY: Low   GOALS: Goals reviewed with patient? Yes  SHORT TERM GOALS: Target date: 11/03/21  Patient will be independent with initial HEP. Goal status: met 11/11/21   LONG TERM GOALS: Target date: 01/04/22  Patient will be independent with advanced/ongoing HEP to improve outcomes and carryover.  Goal status: ongoing  2.  Patient will report at least 75% improvement in L knee pain to improve QOL. Baseline: 7/10, 11/28/21 up to 8/10, 12/18- 7/10 Goal status: ongoing   3.  Patient will demonstrate improved functional LE strength as demonstrated by 4/5 in BLE. Baseline: 3+/5, 11/6-3+/5, 12/18-(4-)-3+/5 Goal status: ongoing  5.  Patient will be able to ambulate 300' with LRAD and normal gait pattern without increased pain to access community.  Baseline: walking w/SPC, 11/6- Able to walk in from parking lot, but then fatigued, approximately 100', 12/18-100' with cane, fatigued. Goal status: on going-   6. Patient will be able to ascend/descend stairs with 1 HR and reciprocal step pattern safely to access home and community.   Baseline: 12/18-Had a fall attempting to climb steps. Goal status: ongoing  7.  Patient will report 527on FOTO (patient reported outcome measure) to demonstrate improved functional ability. Baseline: 38, 11/6-37, 12/18-35 Goal status: ongoing   PLAN: PT FREQUENCY: 1-2x/week  PT DURATION: 10 weeks  PLANNED INTERVENTIONS: Therapeutic exercises, Therapeutic activity, Neuromuscular re-education, Balance training, Gait training, Patient/Family education, Self Care, Joint mobilization, Stair training, Electrical stimulation, Cryotherapy, Moist  heat, Taping, Vasopneumatic device, Traction, Ionotophoresis 493mml Dexamethasone, and Manual therapy, aquatic therapy  PLAN FOR NEXT SESSION: Re-assess HEP. What did Dr DaLatanya Maudlinay?  SuEthel RanaPT 01/26/22 12:27 PM

## 2022-02-02 ENCOUNTER — Ambulatory Visit: Payer: Medicare Other | Admitting: Physical Therapy

## 2022-02-02 ENCOUNTER — Encounter: Payer: Self-pay | Admitting: Physical Therapy

## 2022-02-02 DIAGNOSIS — R279 Unspecified lack of coordination: Secondary | ICD-10-CM

## 2022-02-02 DIAGNOSIS — R2689 Other abnormalities of gait and mobility: Secondary | ICD-10-CM

## 2022-02-02 DIAGNOSIS — G8929 Other chronic pain: Secondary | ICD-10-CM

## 2022-02-02 DIAGNOSIS — M6281 Muscle weakness (generalized): Secondary | ICD-10-CM

## 2022-02-02 DIAGNOSIS — M25562 Pain in left knee: Secondary | ICD-10-CM | POA: Diagnosis not present

## 2022-02-02 NOTE — Therapy (Signed)
OUTPATIENT PHYSICAL THERAPY LOWER EXTREMITY Treatment   PHYSICAL THERAPY DISCHARGE SUMMARY  Visits from Start of Care: 21  Current functional level related to goals / functional outcomes: Goals not met   Remaining deficits: Weakness, pain, limited ROM, limited mobility   Education / Equipment: HEP. Pt encouraged to not get on the floor due to being unsafe to try to stand.   Patient agrees to discharge. Patient goals were not met. Patient is being discharged due to maximized rehab potential.   Patient Name: Bridget Cortez MRN: [id number] DOB:1945-11-20, 77 y.o., female Today's Date: 11/14/2021   Reporting Period 11/14/21 to 01/26/22  See note below for Objective Data and Assessment of Progress/Goals.       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 eye    cataract removed.   Chiari malformation    Diverticulosis    Dysrhythmia    SVT   GERD (gastroesophageal reflux disease)    H/O osteopenia    08/2006   H/O varicella    Heart murmur    Hypertension    Monilial vaginitis 07/2007   Multiple allergies    Ovarian cyst    Personal history of colonic adenomas 03/13/2012   Personal history of radiation therapy 1990   Pneumonia 07/2014   VIN III (vulvar intraepithelial neoplasia III) 03/2009   Yeast infection    Past Surgical History:  Procedure Laterality Date   ABDOMINAL HYSTERECTOMY     ABDOMINAL SURGERY     BREAST EXCISIONAL BIOPSY Right    benign   BREAST LUMPECTOMY Left 1990   BREAST SURGERY     Lumpectomy, left breast   CARDIOVERSION N/A 07/28/2020   Procedure: CARDIOVERSION;  Surgeon: Donato Heinz, MD;  Location: [facility];  Service: Cardiovascular;  Laterality: N/A;   CATARACT EXTRACTION Left 2018    COLONOSCOPY     COLONOSCOPY W/ BIOPSIES     EYE SURGERY     HERNIA REPAIR     KNEE SURGERY     right   lt. eye aneurysm surgery Left 2017   MOUTH SURGERY  2019   rt. side gum surgery  Lt. done 3 years ago   RADIOACTIVE SEED GUIDED EXCISIONAL BREAST BIOPSY Right 07/09/2018   Procedure: RADIOACTIVE SEED GUIDED EXCISIONAL RIGHT  BREAST BIOPSY;  Surgeon: Stark Klein, MD;  Location: [facility];  Service: General;  Laterality: Right;   TOTAL KNEE ARTHROPLASTY Right 09/09/2019   Procedure: RIGHT TOTAL KNEE ARTHROPLASTY;  Surgeon: Melrose Nakayama, MD;  Location: [facility];  Service: Orthopedics;  Laterality: Right;   Patient Active Problem List   Diagnosis Date Noted   Leukocytopenia 09/15/2020   Deficiency anemia 09/15/2020   Persistent atrial fibrillation (HCC)    Primary osteoarthritis of right knee 09/09/2019   (HFpEF) heart failure with preserved ejection fraction ([place]) 04/13/2019   SVT (supraventricular tachycardia) 08/04/2017   Wide-complex tachycardia 08/03/2017   Asthma 08/06/2014   Anxiety about health 08/06/2014   Essential hypertension 08/06/2014   Personal history of colonic adenomas 03/20/2012   Chiari malformation    Heart murmur    H/O varicella    Yeast infection    Cancer ([place])    H/O osteopenia  Abnormal Pap smear    Vulvar intraepithelial neoplasia III (VIN III) 04/12/2009   Atrophy of vagina 06/23/2005    PCP: [name]  REFERRING PROVIDER: Melrose Nakayama  REFERRING DIAG: L knee pain sp R TKA in 2021  THERAPY DIAG:  No diagnosis found.  Rationale for Evaluation and Treatment Rehabilitation  ONSET DATE: 09/13/21  SUBJECTIVE:   SUBJECTIVE STATEMENT:  Patient reports no more falls. L knee is sore today, R knee is ok. She feels her HEP is still working for her.   PERTINENT HISTORY: R TKA, heart failure, edema in ankles, HTN, Chiari malformation   PAIN:  Are you having pain? Yes: NPRS scale: 0/10 Pain location: L knee Pain description: dull, achy   Aggravating factors: standing and walking for too long  Relieving factors: rubbing it with alcohol, Voltaren   PRECAUTIONS: Fall  WEIGHT BEARING RESTRICTIONS No  FALLS:  Has patient fallen in last 6 months? Yes. Number of falls 1  LIVING ENVIRONMENT: Lives with: lives with their spouse Lives in: House/apartment Stairs: Yes: External: 3 steps; on right going up Has following equipment at home: Single point cane and Walker - 2 wheeled  OCCUPATION: retired  PLOF: Independent  PATIENT GOALS to be able to stand and fix my posture    OBJECTIVE:   PATIENT SURVEYS:  FOTO 38/100  COGNITION:  Overall cognitive status: Within functional limits for tasks assessed     SENSATION: WFL   POSTURE: rounded shoulders, forward head, increased thoracic kyphosis, and flexed trunk    LOWER EXTREMITY ROM:  WFL    LOWER EXTREMITY MMT:  MMT Right eval Left eval R/L 11/11/21  Hip flexion 3+ 3+ 4/4  Hip extension     Hip abduction 4- 4- 4/4  Hip adduction 4- 4- 4/4  Hip internal rotation     Hip external rotation     Knee flexion 3+ 3+ 4+/4+  Knee extension 3+ 3+ 4/4- with pain  Ankle dorsiflexion     Ankle plantarflexion     Ankle inversion     Ankle eversion      (Blank rows = not tested)   FUNCTIONAL TESTS:  5 times sit to stand: 11/11/21 32.54 sec from mat without UE Timed up and go (TUG): 45.53s  11/11/21 35.67 with SPC  GAIT: Distance walked: in clinic distances Assistive device utilized: Single point cane Level of assistance: Modified independence Comments: slowed gait, unsteady, decreased step length, decreased arm swing  TODAY'S TREATMENT: 02/02/22 NuStep L4 x 6 minute Reviewed HEP exercises Knee flexion against G tband resistance, 2 x 10 each Knee ext against G tband resistance, 2 x 10 each   01/26/22 NuStep L3 x 6 minutes. Sit to stand x 5 on airex pad, from elevated mat, CGA, fearful Standing march with BUE support on RW, VC for upright posture, 10  reps each leg,  Standing hip abd 10 reps each side Standing heel raises/toe raises, 2 x 5 each Knee flexion in stand with BUE support, 2 x 5 each.  01/09/22 Functional re-assessment for UPOC Resisted knee flexion against Green Tband, 10 reps each leg. Sit to stand x 10 from elevated mat. Mini squats x 10 reps  12/20/21 NuStep L5 x 6 min  S2S elevated mat 3x5  Side stepping R and L, 3x 5 steps each way. Hamstring curls green 2x10 LAQ 2x10 no weight 4in box taps 2x5 each   12/05/21 NuStep L5 x 6 minutes Seated flutter kick x 30 seconds. Mini squat x 10 to  elevated mat. Side stepping R and L, 4 x 5 steps each way. Ambulation 3 x 50' with cane and MI, improved step through gait. VASO to L knee 10 min, low press, 34 degrees.  11/28/21 NuStep L4 x 6 minutes Re-assessment, see goals Mini squats x 10 Seated long kicks, flutter kick x 30 sec, heel rais/toe raise, 10 reps each Vaso to L knee x 10 minutes, low pressure, 34 degrees   11/21/21 NuStep L3 x 6 minutes Seated long kicks, no weight, 2 x 10 each leg Seated step taps on 4" step L,2" step, R 2 x 10 each leg Seated clamshells against G Tband, 2 x 10 reps Vaso to R knee for pain, 34 degrees, low pressure, 10 minutes.  11/14/21 LAQ 3# 2x10 Marching 3# 2x10 Standing hip abd 3# 3x5 Seated clamshells greenTB 2x10 S2S 2x5, second set with red ball push out  Ball squeezes 2x10    11/11/21 Nustep L 5 36mn LE only Green tband HS curl 2 sets STS with wt ball chest press to encourage upright trunk 2 sets 5 3# LAQ 2 sets 10 3# hip flex and abd seated stepping up onto 6 inch box 10x BIL Green tband clams 10 x BIL , 10 x SL Green tband hip flex 2 sets 10  PATIENT EDUCATION:  Education details: POC Person educated: Patient Education method: Explanation Education comprehension: verbalized understanding   HOME EXERCISE PROGRAM:  [id number] ASSESSMENT:  CLINICAL IMPRESSION: Patient did not meet her goals. Her knee  pain fluctuated throughout her stay in therapy, impeding her progress. She has an extensive HEP to perform including seated and standing activities.   OBJECTIVE IMPAIRMENTS Abnormal gait, decreased balance, decreased mobility, difficulty walking, decreased ROM, decreased strength, decreased safety awareness, impaired UE functional use, improper body mechanics, obesity, and pain.   ACTIVITY LIMITATIONS carrying, lifting, bending, sitting, standing, squatting, stairs, transfers, and locomotion level  PARTICIPATION LIMITATIONS: meal prep, cleaning, laundry, shopping, and community activity  PERSONAL FACTORS Age, Behavior pattern, Time since onset of injury/illness/exacerbation, and 1-2 comorbidities: obesity, Chiari malformation, heart murmur  are also affecting patient's functional outcome.   REHAB POTENTIAL: Fair   CLINICAL DECISION MAKING: Evolving/moderate complexity  EVALUATION COMPLEXITY: Low   GOALS: Goals reviewed with patient? Yes  SHORT TERM GOALS: Target date: 11/03/21  Patient will be independent with initial HEP. Goal status: met 11/11/21   LONG TERM GOALS: Target date: 01/04/22  Patient will be independent with advanced/ongoing HEP to improve outcomes and carryover.  Goal status: met  2.  Patient will report at least 75% improvement in L knee pain to improve QOL. Baseline: 7/10, 11/28/21 up to 8/10, 12/18- 7/10 Goal status: 02/02/22- Not met   3.  Patient will demonstrate improved functional LE strength as demonstrated by 4/5 in BLE. Baseline: 3+/5, 11/6-3+/5, 12/18-(4-)-3+/5 Goal status: 02/02/22 not met due to arthritis flaring up repeatedly and limiting her strength  5.  Patient will be able to ambulate 300' with LRAD and normal gait pattern without increased pain to access community.  Baseline: walking w/SPC, 11/6- Able to walk in from parking lot, but then fatigued, approximately 100', 12/18-100' with cane, fatigued. Goal status: not met-02/02/22  6. Patient  will be able to ascend/descend stairs with 1 HR and reciprocal step pattern safely to access home and community.   Baseline: 12/18-Had a fall attempting to climb steps. Goal status: not met 02/02/22  7.  Patient will report 563on FOTO (patient reported outcome measure) to demonstrate improved functional ability.  Baseline: 38, 11/6-37, 12/18-35 Goal status: not met 02/02/22   PLAN: PT FREQUENCY: 1-2x/week  PT DURATION: 10 weeks  PLANNED INTERVENTIONS: Therapeutic exercises, Therapeutic activity, Neuromuscular re-education, Balance training, Gait training, Patient/Family education, Self Care, Joint mobilization, Stair training, Electrical stimulation, Cryotherapy, Moist heat, Taping, Vasopneumatic device, Traction, Ionotophoresis '4mg'$ /ml Dexamethasone, and Manual therapy, aquatic therapy  PLAN FOR NEXT SESSION: Re-assess HEP. What did Dr Latanya Maudlin say?  Ethel Rana DPT 02/02/22 2:00 PM

## 2022-02-09 ENCOUNTER — Ambulatory Visit: Payer: Medicare Other | Admitting: Physical Therapy

## 2022-03-01 DIAGNOSIS — H40023 Open angle with borderline findings, high risk, bilateral: Secondary | ICD-10-CM | POA: Diagnosis not present

## 2022-03-01 DIAGNOSIS — H35033 Hypertensive retinopathy, bilateral: Secondary | ICD-10-CM | POA: Diagnosis not present

## 2022-03-01 DIAGNOSIS — H04123 Dry eye syndrome of bilateral lacrimal glands: Secondary | ICD-10-CM | POA: Diagnosis not present

## 2022-03-01 DIAGNOSIS — H524 Presbyopia: Secondary | ICD-10-CM | POA: Diagnosis not present

## 2022-03-01 DIAGNOSIS — H25811 Combined forms of age-related cataract, right eye: Secondary | ICD-10-CM | POA: Diagnosis not present

## 2022-03-02 ENCOUNTER — Encounter (HOSPITAL_COMMUNITY): Payer: Self-pay | Admitting: *Deleted

## 2022-03-30 ENCOUNTER — Encounter: Payer: Self-pay | Admitting: Student

## 2022-03-30 ENCOUNTER — Ambulatory Visit: Payer: Medicare Other | Attending: Student | Admitting: Student

## 2022-03-30 VITALS — BP 130/78 | HR 62 | Ht 68.5 in | Wt 251.0 lb

## 2022-03-30 DIAGNOSIS — I10 Essential (primary) hypertension: Secondary | ICD-10-CM | POA: Diagnosis not present

## 2022-03-30 DIAGNOSIS — I471 Supraventricular tachycardia, unspecified: Secondary | ICD-10-CM | POA: Diagnosis not present

## 2022-03-30 DIAGNOSIS — I5032 Chronic diastolic (congestive) heart failure: Secondary | ICD-10-CM | POA: Diagnosis not present

## 2022-03-30 DIAGNOSIS — I4892 Unspecified atrial flutter: Secondary | ICD-10-CM | POA: Insufficient documentation

## 2022-03-30 DIAGNOSIS — I4819 Other persistent atrial fibrillation: Secondary | ICD-10-CM | POA: Diagnosis not present

## 2022-03-30 MED ORDER — TORSEMIDE 20 MG PO TABS: 20.0000 mg | ORAL_TABLET | Freq: Every day | ORAL | 1 refills | Status: DC

## 2022-03-30 NOTE — Patient Instructions (Addendum)
Medication Instructions:   TAKE your torsemide one (1) tablet ( 20 mg) daily.   *If you need a refill on your cardiac medications before your next appointment, please call your pharmacy*   Lab Work:  TODAY!! BMET  If you have labs (blood work) drawn today and your tests are completely normal, you will receive your results only by: [place] (if you have MyChart) OR A paper copy in the mail If you have any lab test that is abnormal or we need to change your treatment, we will call you to review the results.   Testing/Procedures:  None ordered.   Follow-Up: At [facility], you and your health needs are our priority.  As part of our continuing mission to provide you with exceptional heart care, we have created designated Provider Care Teams.  These Care Teams include your primary Cardiologist (physician) and Advanced Practice Providers (APPs -  Physician Assistants and Nurse Practitioners) who all work together to provide you with the care you need, when you need it.  We recommend signing up for the patient portal called "MyChart".  Sign up information is provided on this After Visit Summary.  MyChart is used to connect with patients for Virtual Visits (Telemedicine).  Patients are able to view lab/test results, encounter notes, upcoming appointments, etc.  Non-urgent messages can be sent to your provider as well.   To learn more about what you can do with MyChart, go to [email or web address].    Your next appointment:   6 month(s)  Provider:   Virl Axe, MD

## 2022-03-30 NOTE — Progress Notes (Signed)
  Cardiology Office Note:   Date:  03/30/2022  ID:  Bridget Cortez, DOB [birth year], MRN [id number]  Primary Cardiologist: Lauree Chandler, MD Electrophysiologist: Virl Axe, MD   History of Present Illness:   Bridget Cortez is a 77 y.o. female with history of WCT (probable SVT), AFL,PAF, HFpEF, and HTN seen today for routine electrophysiology followup.   Seen by Dr. Caryl Comes 12/2021. Torsemide increased.    She had significant cramping on torsemide and was decreased to every other day.   Since last being seen in our clinic the patient reports doing well. She has been taking torsemide 20 mg daily with good control of her edema. She occasionally misses a day. If she takes more than that she has severe cramping. She is drinking "a lot" of fluid every day to try and offset her cramping, and thought that she was supposed to on torsemide.   Review of systems complete and found to be negative unless listed in HPI.     Studies Reviewed:    EKG is not ordered today. EKG from 12/30/2021 reviewed which showed NSR at 81 bpm  Risk Assessment/Calculations:             Physical Exam:   VS:  BP 130/78   Pulse 62   Ht 5' 8.5" (1.74 m)   Wt 251 lb (113.9 kg)   SpO2 96%   BMI 37.61 kg/m    Wt Readings from Last 3 Encounters:  03/30/22 251 lb (113.9 kg)  12/30/21 246 lb 3.2 oz (111.7 kg)  11/01/21 251 lb 1.6 oz (113.9 kg)     GEN: Well nourished, well developed in no acute distress NECK: No JVD; No carotid bruits CARDIAC: Regular rate and rhythm, no murmurs, rubs, gallops RESPIRATORY:  Clear to auscultation without rales, wheezing or rhonchi  ABDOMEN: Soft, non-tender, non-distended EXTREMITIES:  No edema; No deformity   ASSESSMENT AND PLAN:    WCT -> likely SVT Paroxysmal atrial flutter Paroxysmal atrial fib Regular on exam.  No recent palpitations.  Continue Xarelto for CHA2DS2/VASc of at least 5.  HTN Stable on current regimen   Chronic diastolic  CHF Echo [id number] LVEF 55-60% Volume status stable on exam.  Continue torsemide 20 mg daily. She can take additional as needed, but states she has severe cramps.  Recommend daily weights and salt/fluid restriction. Discussed at length.  BMET today.   Follow up with Dr. Caryl Comes in 6 months  Signed, Shirley Friar, [name]

## 2022-03-31 LAB — BASIC METABOLIC PANEL
BUN/Creatinine Ratio: 16 (ref 12–28)
BUN: 15 mg/dL (ref 8–27)
CO2: 22 mmol/L (ref 20–29)
Calcium: 9 mg/dL (ref 8.7–10.3)
Chloride: 103 mmol/L (ref 96–106)
Creatinine, Ser: 0.94 mg/dL (ref 0.57–1.00)
Glucose: 86 mg/dL (ref 70–99)
Potassium: 4 mmol/L (ref 3.5–5.2)
Sodium: 140 mmol/L (ref 134–144)
eGFR: 63 mL/min/{1.73_m2} (ref >59)

## 2022-04-17 DIAGNOSIS — N6452 Nipple discharge: Secondary | ICD-10-CM | POA: Diagnosis not present

## 2022-04-17 DIAGNOSIS — Z853 Personal history of malignant neoplasm of breast: Secondary | ICD-10-CM | POA: Diagnosis not present

## 2022-04-19 ENCOUNTER — Other Ambulatory Visit: Payer: Self-pay | Admitting: General Surgery

## 2022-04-19 DIAGNOSIS — N6452 Nipple discharge: Secondary | ICD-10-CM

## 2022-04-22 ENCOUNTER — Ambulatory Visit
Admission: RE | Admit: 2022-04-22 | Discharge: 2022-04-22 | Disposition: A | Discharge status: Home / Self Care | Payer: Medicare Other | Source: Ambulatory Visit | Attending: General Surgery | Admitting: General Surgery

## 2022-04-22 DIAGNOSIS — N6452 Nipple discharge: Secondary | ICD-10-CM

## 2022-04-27 ENCOUNTER — Other Ambulatory Visit: Payer: Self-pay

## 2022-04-27 ENCOUNTER — Inpatient Hospital Stay: Payer: Medicare Other | Attending: Internal Medicine

## 2022-04-27 ENCOUNTER — Inpatient Hospital Stay (HOSPITAL_BASED_OUTPATIENT_CLINIC_OR_DEPARTMENT_OTHER): Payer: Medicare Other | Admitting: Internal Medicine

## 2022-04-27 VITALS — BP 144/68 | HR 78 | Temp 98.3°F | Resp 17 | Wt 254.7 lb

## 2022-04-27 DIAGNOSIS — Z923 Personal history of irradiation: Secondary | ICD-10-CM | POA: Insufficient documentation

## 2022-04-27 DIAGNOSIS — I10 Essential (primary) hypertension: Secondary | ICD-10-CM | POA: Insufficient documentation

## 2022-04-27 DIAGNOSIS — D539 Nutritional anemia, unspecified: Secondary | ICD-10-CM

## 2022-04-27 DIAGNOSIS — Z7901 Long term (current) use of anticoagulants: Secondary | ICD-10-CM | POA: Insufficient documentation

## 2022-04-27 DIAGNOSIS — M2550 Pain in unspecified joint: Secondary | ICD-10-CM | POA: Insufficient documentation

## 2022-04-27 DIAGNOSIS — Z79899 Other long term (current) drug therapy: Secondary | ICD-10-CM | POA: Diagnosis not present

## 2022-04-27 DIAGNOSIS — K219 Gastro-esophageal reflux disease without esophagitis: Secondary | ICD-10-CM | POA: Insufficient documentation

## 2022-04-27 DIAGNOSIS — I471 Supraventricular tachycardia, unspecified: Secondary | ICD-10-CM | POA: Insufficient documentation

## 2022-04-27 DIAGNOSIS — Z7951 Long term (current) use of inhaled steroids: Secondary | ICD-10-CM | POA: Diagnosis not present

## 2022-04-27 DIAGNOSIS — M8580 Other specified disorders of bone density and structure, unspecified site: Secondary | ICD-10-CM | POA: Diagnosis not present

## 2022-04-27 DIAGNOSIS — R011 Cardiac murmur, unspecified: Secondary | ICD-10-CM | POA: Diagnosis not present

## 2022-04-27 DIAGNOSIS — D72819 Decreased white blood cell count, unspecified: Secondary | ICD-10-CM | POA: Diagnosis not present

## 2022-04-27 DIAGNOSIS — J45909 Unspecified asthma, uncomplicated: Secondary | ICD-10-CM | POA: Insufficient documentation

## 2022-04-27 DIAGNOSIS — R5383 Other fatigue: Secondary | ICD-10-CM | POA: Diagnosis not present

## 2022-04-27 DIAGNOSIS — D649 Anemia, unspecified: Secondary | ICD-10-CM | POA: Diagnosis not present

## 2022-04-27 LAB — CBC WITH DIFFERENTIAL (CANCER CENTER ONLY)
Abs Immature Granulocytes: 0.01 10*3/uL (ref 0.00–0.07)
Basophils Absolute: 0 10*3/uL (ref 0.0–0.1)
Basophils Relative: 1 %
Eosinophils Absolute: 0.2 10*3/uL (ref 0.0–0.5)
Eosinophils Relative: 5 %
HCT: 32.2 % — ABNORMAL LOW (ref 36.0–46.0)
Hemoglobin: 10.3 g/dL — ABNORMAL LOW (ref 12.0–15.0)
Immature Granulocytes: 0 %
Lymphocytes Relative: 37 %
Lymphs Abs: 1.4 10*3/uL (ref 0.7–4.0)
MCH: 27 pg (ref 26.0–34.0)
MCHC: 32 g/dL (ref 30.0–36.0)
MCV: 84.5 fL (ref 80.0–100.0)
Monocytes Absolute: 0.4 10*3/uL (ref 0.1–1.0)
Monocytes Relative: 11 %
Neutro Abs: 1.7 10*3/uL (ref 1.7–7.7)
Neutrophils Relative %: 46 %
Platelet Count: 226 10*3/uL (ref 150–400)
RBC: 3.81 MIL/uL — ABNORMAL LOW (ref 3.87–5.11)
RDW: 16.9 % — ABNORMAL HIGH (ref 11.5–15.5)
WBC Count: 3.7 10*3/uL — ABNORMAL LOW (ref 4.0–10.5)
nRBC: 0 % (ref 0.0–0.2)

## 2022-04-27 LAB — IRON AND IRON BINDING CAPACITY (CC-WL,HP ONLY)
Iron: 59 ug/dL (ref 28–170)
Saturation Ratios: 16 % (ref 10.4–31.8)
TIBC: 374 ug/dL (ref 250–450)
UIBC: 315 ug/dL (ref 148–442)

## 2022-04-27 NOTE — Progress Notes (Signed)
[facility] [phone]   [phone]  OFFICE PROGRESS NOTE  [name], MD [place] [address]  DIAGNOSIS:  Leukocytopenia and mild anemia.  Her leukocytopenia is likely drug-induced from her current treatment rosuvastatin.  She also has mild anemia of chronic disease.    PRIOR THERAPY:None  CURRENT THERAPY: None  INTERVAL HISTORY: Bridget Cortez 77 y.o. female returns to the clinic today for follow-up visit accompanied by her husband.  The patient is feeling fine today with no concerning complaints except for the baseline fatigue and arthralgia.  She is expected to have a knee replacement in the future especially the right knee.  She denied having any current chest pain but has shortness of breath with exertion with no cough or hemoptysis.  She has no nausea, vomiting, diarrhea or constipation.  She has no headache or visual changes.  She has no recent weight loss or night sweats.  She is here today for evaluation and repeat blood work.   MEDICAL HISTORY: Past Medical History:  Diagnosis Date   Abnormal Pap smear 2006   vaginal medicine according to patient   Allergy    Arthritis    Asthma    Atrophy of vagina 06/2005   Breast cancer ([facility]) 1999   Left   CAP (community acquired pneumonia) 08/06/2014   Cataract    cataracts lt. eye    cataract removed.   Chiari malformation    Diverticulosis    Dysrhythmia    SVT   GERD (gastroesophageal reflux disease)    H/O osteopenia    08/2006   H/O varicella    Heart murmur    Hypertension    Monilial vaginitis 07/2007   Multiple allergies    Ovarian cyst    Personal history of colonic adenomas 03/13/2012   Personal history of radiation therapy 1990   Pneumonia 07/2014   VIN III (vulvar intraepithelial neoplasia III) 03/2009   Yeast infection     ALLERGIES:  is allergic to ephedrine and cortisone.  MEDICATIONS:  Current Outpatient Medications  Medication  Sig Dispense Refill   acetaminophen (TYLENOL) 325 MG tablet Take 2 tablets (650 mg total) by mouth every 6 (six) hours as needed for mild pain, fever or headache (or Fever >/= 101).     albuterol (PROVENTIL) (5 MG/ML) 0.5% nebulizer solution Take 0.5 mLs (2.5 mg total) by nebulization every 6 (six) hours as needed for wheezing or shortness of breath. (Patient taking differently: Take 2.5 mg by nebulization daily as needed for wheezing or shortness of breath.) 20 mL 12   amLODipine (NORVASC) 10 MG tablet Take 5 mg by mouth at bedtime.      Calcium Carbonate-Vitamin D (CALTRATE 600+D PO) Take 1 tablet by mouth daily with lunch.      cetirizine (ZYRTEC) 10 MG tablet Take 10 mg by mouth daily.     esomeprazole (NEXIUM) 40 MG capsule Take 40 mg by mouth 2 (two) times daily before a meal.      fluticasone (FLONASE) 50 MCG/ACT nasal spray Place 1 spray into both nostrils at bedtime.      Fluticasone-Salmeterol (ADVAIR) 250-50 MCG/DOSE AEPB Inhale 1 puff into the lungs every 12 (twelve) hours.     Multiple Vitamin (MULTIVITAMIN) tablet Take 1 tablet by mouth daily with lunch.      nebivolol (BYSTOLIC) 5 MG tablet Take 1 tablet (5 mg total) by mouth daily. 90 tablet 1   potassium chloride SA (KLOR-CON)  20 MEQ tablet Take 20 mEq by mouth 3 (three) times daily.     rivaroxaban (XARELTO) 20 MG TABS tablet TAKE 1 TABLET BY MOUTH ONCE DAILY WITH SUPPER 90 tablet 1   rosuvastatin (CRESTOR) 5 MG tablet Take 5 mg by mouth daily.     simethicone (MYLICON) 0000000 MG chewable tablet Chew 125 mg by mouth every 6 (six) hours as needed for flatulence.     telmisartan (MICARDIS) 40 MG tablet Take 40 mg by mouth daily.     torsemide (DEMADEX) 20 MG tablet Take 1 tablet (20 mg total) by mouth daily. 90 tablet 1   traMADol (ULTRAM) 50 MG tablet Take 50 mg by mouth as needed.     traZODone (DESYREL) 50 MG tablet Take 25 mg by mouth at bedtime as needed for sleep.     triamcinolone cream (KENALOG) 0.1 % Apply 1 application   topically daily as needed ([place]).     vitamin C (ASCORBIC ACID) 500 MG tablet Take 500 mg by mouth daily.     No current facility-administered medications for this visit.    SURGICAL HISTORY:  Past Surgical History:  Procedure Laterality Date   ABDOMINAL HYSTERECTOMY     ABDOMINAL SURGERY     BREAST EXCISIONAL BIOPSY Right    benign   BREAST LUMPECTOMY Left 1990   BREAST SURGERY     Lumpectomy, left breast   CARDIOVERSION N/A 07/28/2020   Procedure: CARDIOVERSION;  Surgeon: Donato Heinz, MD;  Location: [facility];  Service: Cardiovascular;  Laterality: N/A;   CATARACT EXTRACTION Left 2018   COLONOSCOPY     COLONOSCOPY W/ BIOPSIES     EYE SURGERY     HERNIA REPAIR     KNEE SURGERY     right   lt. eye aneurysm surgery Left 2017   MOUTH SURGERY  2019   rt. side gum surgery  Lt. done 3 years ago   RADIOACTIVE SEED GUIDED EXCISIONAL BREAST BIOPSY Right 07/09/2018   Procedure: RADIOACTIVE SEED GUIDED EXCISIONAL RIGHT  BREAST BIOPSY;  Surgeon: Stark Klein, MD;  Location: [facility];  Service: General;  Laterality: Right;   TOTAL KNEE ARTHROPLASTY Right 09/09/2019   Procedure: RIGHT TOTAL KNEE ARTHROPLASTY;  Surgeon: Melrose Nakayama, MD;  Location: [facility];  Service: Orthopedics;  Laterality: Right;    REVIEW OF SYSTEMS:  A comprehensive review of systems was negative except for: Constitutional: positive for fatigue Respiratory: positive for dyspnea on exertion Musculoskeletal: positive for arthralgias   PHYSICAL EXAMINATION: General appearance: alert, cooperative, fatigued, and no distress Head: Normocephalic, without obvious abnormality, atraumatic Neck: no adenopathy, no JVD, supple, symmetrical, trachea midline, and thyroid not enlarged, symmetric, no tenderness/mass/nodules Lymph nodes: Cervical, supraclavicular, and axillary nodes normal. Resp: clear to auscultation bilaterally Back: symmetric, no curvature. ROM normal. No CVA tenderness. Cardio: regular rate and  rhythm, S1, S2 normal, no murmur, click, rub or gallop GI: soft, non-tender; bowel sounds normal; no masses,  no organomegaly Extremities: extremities normal, atraumatic, no cyanosis or edema  ECOG PERFORMANCE STATUS: 1 - Symptomatic but completely ambulatory  Blood pressure (!) 144/68, pulse 78, temperature 98.3 F (36.8 C), temperature source Oral, resp. rate 17, weight 254 lb 11.2 oz (115.5 kg), SpO2 100 %.  LABORATORY DATA: Lab Results  Component Value Date   WBC 3.6 (L) 11/01/2021   HGB 11.6 (L) 11/01/2021   HCT 35.5 (L) 11/01/2021   MCV 85.1 11/01/2021   PLT 244 11/01/2021      Chemistry  Component Value Date/Time   NA 140 03/30/2022 1145   K 4.0 03/30/2022 1145   CL 103 03/30/2022 1145   CO2 22 03/30/2022 1145   BUN 15 03/30/2022 1145   CREATININE 0.94 03/30/2022 1145   CREATININE 0.79 09/15/2020 1121      Component Value Date/Time   CALCIUM 9.0 03/30/2022 1145   ALKPHOS 87 09/15/2020 1121   AST 18 09/15/2020 1121   ALT 14 09/15/2020 1121   BILITOT 0.5 09/15/2020 1121       RADIOGRAPHIC STUDIES: MM 3D DIAGNOSTIC MAMMOGRAM UNILATERAL RIGHT BREAST  Result Date: 04/22/2022 CLINICAL DATA:  Right-sided bloody nipple discharge. History of discharge in the past. Surgery at that time demonstrated ductal ectasia and inflammation but no papilloma or cancer. EXAM: DIGITAL DIAGNOSTIC UNILATERAL RIGHT MAMMOGRAM WITH TOMOSYNTHESIS; ULTRASOUND RIGHT BREAST LIMITED TECHNIQUE: Right digital diagnostic mammography and breast tomosynthesis was performed.; Targeted ultrasound examination of the right breast was performed COMPARISON:  Previous exam(s). ACR Breast Density Category b: There are scattered areas of fibroglandular density. FINDINGS: Right postsurgical changes. No suspicious masses, calcifications, or distortion. On physical exam, no suspicious lumps. Targeted ultrasound is performed, showing mild ductal ectasia with no intraductal mass or cause for discharge identified.  IMPRESSION: No mammographic or sonographic cause for the patient's discharge. No evidence of malignancy. RECOMMENDATION: Recommend breast MRI given the lack of findings on today's imaging in the setting of bloody nipple discharge. I have discussed the findings and recommendations with the patient. If applicable, a reminder letter will be sent to the patient regarding the next appointment. BI-RADS CATEGORY  2: Benign. Electronically Signed   By: Dorise Bullion III M.D.   On: 04/22/2022 11:38  [place] LIMITED ULTRASOUND INCLUDING AXILLA RIGHT BREAST  Result Date: 04/22/2022 CLINICAL DATA:  Right-sided bloody nipple discharge. History of discharge in the past. Surgery at that time demonstrated ductal ectasia and inflammation but no papilloma or cancer. EXAM: DIGITAL DIAGNOSTIC UNILATERAL RIGHT MAMMOGRAM WITH TOMOSYNTHESIS; ULTRASOUND RIGHT BREAST LIMITED TECHNIQUE: Right digital diagnostic mammography and breast tomosynthesis was performed.; Targeted ultrasound examination of the right breast was performed COMPARISON:  Previous exam(s). ACR Breast Density Category b: There are scattered areas of fibroglandular density. FINDINGS: Right postsurgical changes. No suspicious masses, calcifications, or distortion. On physical exam, no suspicious lumps. Targeted ultrasound is performed, showing mild ductal ectasia with no intraductal mass or cause for discharge identified. IMPRESSION: No mammographic or sonographic cause for the patient's discharge. No evidence of malignancy. RECOMMENDATION: Recommend breast MRI given the lack of findings on today's imaging in the setting of bloody nipple discharge. I have discussed the findings and recommendations with the patient. If applicable, a reminder letter will be sent to the patient regarding the next appointment. BI-RADS CATEGORY  2: Benign. Electronically Signed   By: Dorise Bullion III M.D.   On: 04/22/2022 11:38   ASSESSMENT AND PLAN: This is a very pleasant 77 years old  African-American female with persistent leukocytopenia and anemia of unclear etiology.  It was initially thought to be drug-induced but there is no improvement in her condition after discontinuation of the suspicious culprit medication.. The patient underwent a bone marrow biopsy and aspirate.  Her biopsy showed no concerning bone marrow abnormality except for a slight increase of plasma cells but representing 4%. She is currently on observation and doing fine with no concerning complaints except for the persistent mild leukocytopenia and anemia. I recommended for the patient to continue on observation with repeat blood work in 6 months.  In the  meantime I advised her to start taking over-the-counter oral iron tablet with vitamin C as well as oral vitamin B12. I will see her back for follow-up visit in 6 months for evaluation and repeat blood work. She was advised to call immediately if she has any concerning symptoms in the interval. The patient voices understanding of current disease status and treatment options and is in agreement with the current care plan.  All questions were answered. The patient knows to call the clinic with any problems, questions or concerns. We can certainly see the patient much sooner if necessary.   Disclaimer: This note was dictated with voice recognition software. Similar sounding words can inadvertently be transcribed and may not be corrected upon review.

## 2022-04-28 ENCOUNTER — Telehealth: Payer: Self-pay | Admitting: Internal Medicine

## 2022-04-28 LAB — FERRITIN: Ferritin: 12 ng/mL (ref 11–307)

## 2022-04-28 NOTE — Telephone Encounter (Signed)
Left patient a vm regarding upcoming appointments  

## 2022-05-02 ENCOUNTER — Ambulatory Visit: Payer: Medicare Other | Admitting: Internal Medicine

## 2022-05-02 ENCOUNTER — Other Ambulatory Visit: Payer: Medicare Other

## 2022-05-04 ENCOUNTER — Other Ambulatory Visit: Payer: Self-pay | Admitting: General Surgery

## 2022-05-04 DIAGNOSIS — Z853 Personal history of malignant neoplasm of breast: Secondary | ICD-10-CM

## 2022-05-19 DIAGNOSIS — N6452 Nipple discharge: Secondary | ICD-10-CM | POA: Diagnosis not present

## 2022-05-19 DIAGNOSIS — Z853 Personal history of malignant neoplasm of breast: Secondary | ICD-10-CM | POA: Diagnosis not present

## 2022-06-03 ENCOUNTER — Ambulatory Visit
Admission: RE | Admit: 2022-06-03 | Discharge: 2022-06-03 | Disposition: A | Discharge status: Home / Self Care | Payer: Medicare Other | Source: Ambulatory Visit | Attending: General Surgery | Admitting: General Surgery

## 2022-06-03 DIAGNOSIS — Z853 Personal history of malignant neoplasm of breast: Secondary | ICD-10-CM

## 2022-06-09 ENCOUNTER — Other Ambulatory Visit: Payer: Self-pay | Admitting: Internal Medicine

## 2022-06-09 DIAGNOSIS — I4892 Unspecified atrial flutter: Secondary | ICD-10-CM

## 2022-06-09 NOTE — Telephone Encounter (Signed)
Prescription refill request for Xarelto received.  Indication: Afib  Last office visit: 03/30/22 Lanna Poche) Weight: 115.5kg Age: 77 Scr: 0.94 (03/30/22)  CrCl: 92.65ml/min  Appropriate dose. Refill sent.

## 2022-06-23 ENCOUNTER — Ambulatory Visit (HOSPITAL_COMMUNITY): Admission: RE | Admit: 2022-06-23 | Payer: Medicare Other | Source: Ambulatory Visit

## 2022-06-23 ENCOUNTER — Encounter (HOSPITAL_COMMUNITY): Payer: Self-pay

## 2022-06-30 ENCOUNTER — Encounter (INDEPENDENT_AMBULATORY_CARE_PROVIDER_SITE_OTHER): Payer: Medicare Other | Admitting: Ophthalmology

## 2022-06-30 DIAGNOSIS — H43813 Vitreous degeneration, bilateral: Secondary | ICD-10-CM

## 2022-06-30 DIAGNOSIS — I10 Essential (primary) hypertension: Secondary | ICD-10-CM

## 2022-06-30 DIAGNOSIS — H35033 Hypertensive retinopathy, bilateral: Secondary | ICD-10-CM | POA: Diagnosis not present

## 2022-06-30 DIAGNOSIS — H3509 Other intraretinal microvascular abnormalities: Secondary | ICD-10-CM | POA: Diagnosis not present

## 2022-07-12 ENCOUNTER — Ambulatory Visit
Admission: RE | Admit: 2022-07-12 | Discharge: 2022-07-12 | Disposition: A | Discharge status: Home / Self Care | Payer: Medicare Other | Source: Ambulatory Visit | Attending: General Surgery | Admitting: General Surgery

## 2022-07-12 DIAGNOSIS — R928 Other abnormal and inconclusive findings on diagnostic imaging of breast: Secondary | ICD-10-CM | POA: Diagnosis not present

## 2022-07-12 DIAGNOSIS — N6452 Nipple discharge: Secondary | ICD-10-CM | POA: Diagnosis not present

## 2022-07-12 MED ORDER — GADOPICLENOL 0.5 MMOL/ML IV SOLN
10.0000 mL | Freq: Once | INTRAVENOUS | Status: AC | PRN
  Administered 2022-07-12: 10 mL via INTRAVENOUS

## 2022-07-13 DIAGNOSIS — Z6838 Body mass index (BMI) 38.0-38.9, adult: Secondary | ICD-10-CM | POA: Diagnosis not present

## 2022-07-13 DIAGNOSIS — Z90711 Acquired absence of uterus with remaining cervical stump: Secondary | ICD-10-CM | POA: Diagnosis not present

## 2022-07-13 DIAGNOSIS — Z1231 Encounter for screening mammogram for malignant neoplasm of breast: Secondary | ICD-10-CM | POA: Diagnosis not present

## 2022-07-13 DIAGNOSIS — Z1211 Encounter for screening for malignant neoplasm of colon: Secondary | ICD-10-CM | POA: Diagnosis not present

## 2022-07-13 DIAGNOSIS — Z87412 Personal history of vulvar dysplasia: Secondary | ICD-10-CM | POA: Diagnosis not present

## 2022-07-13 DIAGNOSIS — Z13820 Encounter for screening for osteoporosis: Secondary | ICD-10-CM | POA: Diagnosis not present

## 2022-07-13 DIAGNOSIS — Z853 Personal history of malignant neoplasm of breast: Secondary | ICD-10-CM | POA: Diagnosis not present

## 2022-07-13 DIAGNOSIS — Z01419 Encounter for gynecological examination (general) (routine) without abnormal findings: Secondary | ICD-10-CM | POA: Diagnosis not present

## 2022-07-13 DIAGNOSIS — Z139 Encounter for screening, unspecified: Secondary | ICD-10-CM | POA: Diagnosis not present

## 2022-07-13 DIAGNOSIS — N952 Postmenopausal atrophic vaginitis: Secondary | ICD-10-CM | POA: Diagnosis not present

## 2022-07-17 ENCOUNTER — Telehealth: Payer: Self-pay

## 2022-07-17 ENCOUNTER — Other Ambulatory Visit: Payer: Self-pay | Admitting: General Surgery

## 2022-07-17 DIAGNOSIS — Z853 Personal history of malignant neoplasm of breast: Secondary | ICD-10-CM | POA: Diagnosis not present

## 2022-07-17 DIAGNOSIS — N6452 Nipple discharge: Secondary | ICD-10-CM | POA: Diagnosis not present

## 2022-07-17 NOTE — Telephone Encounter (Signed)
   Pre-operative Risk Assessment    Patient Name: Bridget Cortez  DOB: 02-04-1945 MRN: [id number]      Request for Surgical Clearance    Procedure:   RIGHT BREAST CENTRAL EXCISION   Date of Surgery:  Clearance TBD                                 Surgeon:  DR. Almond Lint Surgeon's Group or Practice Name:  [facility] Phone number:  [phone] Fax number:  [phone]   Type of Clearance Requested:   - Medical  - Pharmacy:  Hold Rivaroxaban (Xarelto) NEEDS INSTRUCTIONS    Type of Anesthesia:  General    Additional requests/questions:    SignedMichaelle Copas   07/17/2022, 2:19 PM

## 2022-07-17 NOTE — Telephone Encounter (Signed)
   Name: Bridget Cortez  DOB: [birth year]  MRN: [id number]  Primary Cardiologist: Verne Carrow, MD   Preoperative team, please contact this patient and set up a phone call appointment for further preoperative risk assessment. Please obtain consent and complete medication review. Thank you for your help.  I confirm that guidance regarding antiplatelet and oral anticoagulation therapy has been completed and, if necessary, noted below.  Pharmacy has provided recommendations for holding anticoagulation.   Ronney Asters, NP 07/17/2022, 3:53 PM [facility]

## 2022-07-17 NOTE — Telephone Encounter (Signed)
I left a message for the patient to call our office to schedule a tele visit for pre-op 

## 2022-07-17 NOTE — Telephone Encounter (Signed)
Patient with diagnosis of afib on Xarelto for anticoagulation.    Procedure: right breast central excision Date of procedure: TBD  CHA2DS2-VASc Score = 5  This indicates a 7.2% annual risk of stroke. The patient's score is based upon: CHF History: 1 HTN History: 1 Diabetes History: 0 Stroke History: 0 Vascular Disease History: 0 Age Score: 2 Gender Score: 1  CrCl 35mL/min using adjusted body weight Platelet count 226K  Per office protocol, patient can hold Xarelto for 2-3 days prior to procedure.    **This guidance is not considered finalized until pre-operative APP has relayed final recommendations.**

## 2022-07-18 ENCOUNTER — Telehealth: Payer: Self-pay

## 2022-07-18 DIAGNOSIS — Z1212 Encounter for screening for malignant neoplasm of rectum: Secondary | ICD-10-CM | POA: Diagnosis not present

## 2022-07-18 NOTE — Telephone Encounter (Signed)
  Patient Consent for Virtual Visit        Bridget Cortez has provided verbal consent on 07/18/2022 for a virtual visit (video or telephone).   CONSENT FOR VIRTUAL VISIT FOR:  Bridget Cortez  By participating in this virtual visit I agree to the following:  I hereby voluntarily request, consent and authorize [facility] and its employed or contracted physicians, physician assistants, nurse practitioners or other licensed health care professionals (the Practitioner), to provide me with telemedicine health care services (the "Services") as deemed necessary by the treating Practitioner. I acknowledge and consent to receive the Services by the Practitioner via telemedicine. I understand that the telemedicine visit will involve communicating with the Practitioner through live audiovisual communication technology and the disclosure of certain medical information by electronic transmission. I acknowledge that I have been given the opportunity to request an in-person assessment or other available alternative prior to the telemedicine visit and am voluntarily participating in the telemedicine visit.  I understand that I have the right to withhold or withdraw my consent to the use of telemedicine in the course of my care at any time, without affecting my right to future care or treatment, and that the Practitioner or I may terminate the telemedicine visit at any time. I understand that I have the right to inspect all information obtained and/or recorded in the course of the telemedicine visit and may receive copies of available information for a reasonable fee.  I understand that some of the potential risks of receiving the Services via telemedicine include:  Delay or interruption in medical evaluation due to technological equipment failure or disruption; Information transmitted may not be sufficient (e.g. poor resolution of images) to allow for appropriate medical decision making by  the Practitioner; and/or  In rare instances, security protocols could fail, causing a breach of personal health information.  Furthermore, I acknowledge that it is my responsibility to provide information about my medical history, conditions and care that is complete and accurate to the best of my ability. I acknowledge that Practitioner's advice, recommendations, and/or decision may be based on factors not within their control, such as incomplete or inaccurate data provided by me or distortions of diagnostic images or specimens that may result from electronic transmissions. I understand that the practice of medicine is not an exact science and that Practitioner makes no warranties or guarantees regarding treatment outcomes. I acknowledge that a copy of this consent can be made available to me via my patient portal [facility] MyChart), or I can request a printed copy by calling the office of [facility].    I understand that my insurance will be billed for this visit.   I have read or had this consent read to me. I understand the contents of this consent, which adequately explains the benefits and risks of the Services being provided via telemedicine.  I have been provided ample opportunity to ask questions regarding this consent and the Services and have had my questions answered to my satisfaction. I give my informed consent for the services to be provided through the use of telemedicine in my medical care

## 2022-07-18 NOTE — Telephone Encounter (Signed)
Spoke with patient who is agreeable to do a tele visit on 7/1 at 2:20 pm. Consent and med rec done.

## 2022-07-24 ENCOUNTER — Ambulatory Visit: Payer: Medicare Other | Attending: Internal Medicine

## 2022-07-24 DIAGNOSIS — L304 Erythema intertrigo: Secondary | ICD-10-CM | POA: Diagnosis not present

## 2022-07-24 DIAGNOSIS — L300 Nummular dermatitis: Secondary | ICD-10-CM | POA: Diagnosis not present

## 2022-07-24 DIAGNOSIS — L910 Hypertrophic scar: Secondary | ICD-10-CM | POA: Diagnosis not present

## 2022-07-24 DIAGNOSIS — Z01810 Encounter for preprocedural cardiovascular examination: Secondary | ICD-10-CM

## 2022-07-24 DIAGNOSIS — L821 Other seborrheic keratosis: Secondary | ICD-10-CM | POA: Diagnosis not present

## 2022-07-24 DIAGNOSIS — E782 Mixed hyperlipidemia: Secondary | ICD-10-CM | POA: Diagnosis not present

## 2022-07-24 DIAGNOSIS — I10 Essential (primary) hypertension: Secondary | ICD-10-CM | POA: Diagnosis not present

## 2022-07-24 DIAGNOSIS — L309 Dermatitis, unspecified: Secondary | ICD-10-CM | POA: Diagnosis not present

## 2022-07-24 NOTE — Progress Notes (Signed)
Virtual Visit via Telephone Note   Because of Bridget Cortez's co-morbid illnesses, she is at least at moderate risk for complications without adequate follow up.  This format is felt to be most appropriate for this patient at this time.  The patient did not have access to video technology/had technical difficulties with video requiring transitioning to audio format only (telephone).  All issues noted in this document were discussed and addressed.  No physical exam could be performed with this format.  Please refer to the patient's chart for her consent to telehealth for [facility].  Evaluation Performed:  Preoperative cardiovascular risk assessment _____________   Date:  07/24/2022   Patient ID:  Bridget Cortez, DOB [birth year], MRN [id number] Patient Location:  Home Provider location:   Office  Primary Care Provider:  Chilton Greathouse, MD Primary Cardiologist:  Verne Carrow, MD  Chief Complaint / Patient Profile   77 y.o. y/o female with a h/o WCT (probable SVT), AFL,PAF, HFpEF, and HTN  who is pending right breast excision and presents today for telephonic preoperative cardiovascular risk assessment.  History of Present Illness    Bridget Cortez is a 77 y.o. female who presents via audio/video conferencing for a telehealth visit today.  Pt was last seen in cardiology clinic on 03/30/2022 by Otilio Saber, PA. At that time Bridget Cortez was doing well with no new cardiac complaints.  The patient is now pending procedure as outlined above. Since her last visit, she has been doing well with no new cardiac complaints.   Past Medical History    Past Medical History:  Diagnosis Date   Abnormal Pap smear 2006   vaginal medicine according to patient   Allergy    Arthritis    Asthma    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 eye     cataract removed.   Chiari malformation    Diverticulosis    Dysrhythmia    SVT   GERD (gastroesophageal reflux disease)    H/O osteopenia    08/2006   H/O varicella    Heart murmur    Hypertension    Monilial vaginitis 07/2007   Multiple allergies    Ovarian cyst    Personal history of colonic adenomas 03/13/2012   Personal history of radiation therapy 1990   Pneumonia 07/2014   VIN III (vulvar intraepithelial neoplasia III) 03/2009   Yeast infection    Past Surgical History:  Procedure Laterality Date   ABDOMINAL HYSTERECTOMY     ABDOMINAL SURGERY     BREAST EXCISIONAL BIOPSY Right    benign   BREAST LUMPECTOMY Left 1990   BREAST SURGERY     Lumpectomy, left breast   CARDIOVERSION N/A 07/28/2020   Procedure: CARDIOVERSION;  Surgeon: Little Ishikawa, MD;  Location: [facility] ENDOSCOPY;  Service: Cardiovascular;  Laterality: N/A;   CATARACT EXTRACTION Left 2018   COLONOSCOPY     COLONOSCOPY W/ BIOPSIES     EYE SURGERY     HERNIA REPAIR     KNEE SURGERY     right   lt. eye aneurysm surgery Left 2017   MOUTH SURGERY  2019   rt. side gum surgery  Lt. done 3 years ago   RADIOACTIVE SEED GUIDED EXCISIONAL BREAST BIOPSY Right 07/09/2018   Procedure: RADIOACTIVE SEED GUIDED EXCISIONAL RIGHT  BREAST BIOPSY;  Surgeon: Almond Lint, MD;  Location: [facility] OR;  Service: General;  Laterality: Right;   TOTAL KNEE ARTHROPLASTY Right 09/09/2019   Procedure: RIGHT TOTAL KNEE ARTHROPLASTY;  Surgeon: Marcene Corning, MD;  Location: [facility] ORS;  Service: Orthopedics;  Laterality: Right;    Allergies  Allergies  Allergen Reactions   Ephedrine Shortness Of Breath   Cortisone Other (See Comments)    Severe muscle spasms    Home Medications    Prior to Admission medications   Medication Sig Start Date End Date Taking? Authorizing Provider  acetaminophen (TYLENOL) 325 MG tablet Take 2 tablets (650 mg total) by mouth every 6 (six) hours as needed for mild pain, fever or headache (or Fever >/=  101). 08/11/14   Hongalgi, Maximino Greenland, MD  albuterol (PROVENTIL) (5 MG/ML) 0.5% nebulizer solution Take 0.5 mLs (2.5 mg total) by nebulization every 6 (six) hours as needed for wheezing or shortness of breath. Patient taking differently: Take 2.5 mg by nebulization daily as needed for wheezing or shortness of breath. 02/23/18   Curatolo, Adam, DO  amLODipine (NORVASC) 10 MG tablet Take 5 mg by mouth at bedtime.     [provider]  Calcium Carbonate-Vitamin D (CALTRATE 600+D PO) Take 1 tablet by mouth daily with lunch.     [provider]  cetirizine (ZYRTEC) 10 MG tablet Take 10 mg by mouth daily.    [provider]  esomeprazole (NEXIUM) 40 MG capsule Take 40 mg by mouth 2 (two) times daily before a meal.     [provider]  fluticasone (FLONASE) 50 MCG/ACT nasal spray Place 1 spray into both nostrils at bedtime.  03/16/12   [provider]  Fluticasone-Salmeterol (ADVAIR) 250-50 MCG/DOSE AEPB Inhale 1 puff into the lungs every 12 (twelve) hours.    [provider]  Multiple Vitamin (MULTIVITAMIN) tablet Take 1 tablet by mouth daily with lunch.     [provider]  nebivolol (BYSTOLIC) 5 MG tablet Take 1 tablet (5 mg total) by mouth daily. 07/05/21   Duke Salvia, MD  potassium chloride SA (KLOR-CON) 20 MEQ tablet Take 20 mEq by mouth 3 (three) times daily.    [provider]  rivaroxaban (XARELTO) 20 MG TABS tablet TAKE 1 TABLET BY MOUTH ONCE DAILY WITH SUPPER 06/09/22   Kathleene Hazel, MD  rosuvastatin (CRESTOR) 5 MG tablet Take 5 mg by mouth daily.    [provider]  simethicone (MYLICON) 125 MG chewable tablet Chew 125 mg by mouth every 6 (six) hours as needed for flatulence.    [provider]  telmisartan (MICARDIS) 40 MG tablet Take 40 mg by mouth daily.    [provider]  torsemide (DEMADEX) 20 MG tablet Take 1 tablet (20 mg total) by mouth daily. 03/30/22 03/30/23  Graciella Freer,  [name]  traMADol (ULTRAM) 50 MG tablet Take 50 mg by mouth as needed. 11/05/20   [provider]  traZODone (DESYREL) 50 MG tablet Take 25 mg by mouth at bedtime as needed for sleep.    [provider]  triamcinolone cream (KENALOG) 0.1 % Apply 1 application  topically daily as needed ([place]old). 07/05/20   [provider]  vitamin C (ASCORBIC ACID) 500 MG tablet Take 500 mg by mouth daily.    [provider]    Physical Exam    Vital Signs:  Bridget Cortez does not have vital signs available for review today.  Given telephonic nature of communication, physical exam is limited. AAOx3. NAD. Normal affect.  Speech and respirations are unlabored.  Accessory Clinical Findings    None  Assessment & Plan    1.  Preoperative Cardiovascular Risk Assessment:  Patient's RCRI score is 0.9%  The patient affirms she has been doing well without any new cardiac symptoms. They are able to achieve 5 METS without cardiac limitations. Therefore, based on ACC/AHA guidelines, the patient would be at acceptable risk for the planned procedure without further cardiovascular testing. The patient was advised that if she develops new symptoms prior to surgery to contact our office to arrange for a follow-up visit, and she verbalized understanding.   The patient was advised that if she develops new symptoms prior to surgery to contact our office to arrange for a follow-up visit, and she verbalized understanding.  Patient was advised to stop Xarelto 2 to 3 days prior to procedure and should restart postprocedure when surgically safe and hemostasis is achieved.   A copy of this note will be routed to requesting surgeon.  Time:   Today, I have spent 12 minutes with the patient with telehealth technology discussing medical history, symptoms, and management plan.     Napoleon Form, Leodis Rains, NP  07/24/2022, 7:29 AM

## 2022-07-25 DIAGNOSIS — M1990 Unspecified osteoarthritis, unspecified site: Secondary | ICD-10-CM | POA: Diagnosis not present

## 2022-07-25 DIAGNOSIS — E782 Mixed hyperlipidemia: Secondary | ICD-10-CM | POA: Diagnosis not present

## 2022-07-25 DIAGNOSIS — Z1339 Encounter for screening examination for other mental health and behavioral disorders: Secondary | ICD-10-CM | POA: Diagnosis not present

## 2022-07-25 DIAGNOSIS — F419 Anxiety disorder, unspecified: Secondary | ICD-10-CM | POA: Diagnosis not present

## 2022-07-25 DIAGNOSIS — D72819 Decreased white blood cell count, unspecified: Secondary | ICD-10-CM | POA: Diagnosis not present

## 2022-07-25 DIAGNOSIS — I10 Essential (primary) hypertension: Secondary | ICD-10-CM | POA: Diagnosis not present

## 2022-07-25 DIAGNOSIS — Z1331 Encounter for screening for depression: Secondary | ICD-10-CM | POA: Diagnosis not present

## 2022-07-25 DIAGNOSIS — Z0000 Encounter for general adult medical examination without abnormal findings: Secondary | ICD-10-CM | POA: Diagnosis not present

## 2022-07-25 DIAGNOSIS — I119 Hypertensive heart disease without heart failure: Secondary | ICD-10-CM | POA: Diagnosis not present

## 2022-07-25 DIAGNOSIS — J4521 Mild intermittent asthma with (acute) exacerbation: Secondary | ICD-10-CM | POA: Diagnosis not present

## 2022-07-25 DIAGNOSIS — E038 Other specified hypothyroidism: Secondary | ICD-10-CM | POA: Diagnosis not present

## 2022-07-25 DIAGNOSIS — I4891 Unspecified atrial fibrillation: Secondary | ICD-10-CM | POA: Diagnosis not present

## 2022-07-25 DIAGNOSIS — J45909 Unspecified asthma, uncomplicated: Secondary | ICD-10-CM | POA: Diagnosis not present

## 2022-07-25 DIAGNOSIS — R82998 Other abnormal findings in urine: Secondary | ICD-10-CM | POA: Diagnosis not present

## 2022-07-28 ENCOUNTER — Telehealth: Payer: Self-pay | Admitting: Internal Medicine

## 2022-07-28 NOTE — Telephone Encounter (Signed)
Pt c/o medication issue:  1. Name of Medication: Torsemide  2. How are you currently taking this medication (dosage and times per day)?   3. Are you having a reaction (difficulty breathing--STAT)?   4. What is your medication issue? Patient says she is scheduled for surgery on 08-08-22. She wants to know when does she stop her Torsemide? Please leave on her answering machine, if she does not answer - she said she was told about her blood thinner

## 2022-07-28 NOTE — Telephone Encounter (Signed)
I s/w the pt and advised she will need to ask the surgeon if they need her to hold the Torsemide. She tells me that she was told by Dr. Arita Miss office that the hospital will call her and set up a pre op appt and go over what medications to hold. I did confirm this is for the pt that the hospital will call her for a pre op appt and will go over all medications what to hold for surgery. Pt thanked me for the help.

## 2022-08-02 ENCOUNTER — Encounter (HOSPITAL_COMMUNITY)
Admission: RE | Admit: 2022-08-02 | Discharge: 2022-08-02 | Disposition: A | Discharge status: Home / Self Care | Payer: Medicare Other | Source: Ambulatory Visit | Attending: General Surgery | Admitting: General Surgery

## 2022-08-02 ENCOUNTER — Encounter (HOSPITAL_COMMUNITY): Payer: Self-pay

## 2022-08-02 ENCOUNTER — Other Ambulatory Visit: Payer: Self-pay

## 2022-08-02 VITALS — BP 118/66 | HR 72 | Temp 98.4°F | Resp 18 | Ht 68.5 in | Wt 259.8 lb

## 2022-08-02 DIAGNOSIS — I4892 Unspecified atrial flutter: Secondary | ICD-10-CM | POA: Diagnosis not present

## 2022-08-02 DIAGNOSIS — I110 Hypertensive heart disease with heart failure: Secondary | ICD-10-CM | POA: Insufficient documentation

## 2022-08-02 DIAGNOSIS — Z01818 Encounter for other preprocedural examination: Secondary | ICD-10-CM

## 2022-08-02 DIAGNOSIS — I480 Paroxysmal atrial fibrillation: Secondary | ICD-10-CM | POA: Diagnosis not present

## 2022-08-02 DIAGNOSIS — Z01812 Encounter for preprocedural laboratory examination: Secondary | ICD-10-CM | POA: Insufficient documentation

## 2022-08-02 DIAGNOSIS — I5032 Chronic diastolic (congestive) heart failure: Secondary | ICD-10-CM | POA: Insufficient documentation

## 2022-08-02 DIAGNOSIS — Z79899 Other long term (current) drug therapy: Secondary | ICD-10-CM | POA: Insufficient documentation

## 2022-08-02 LAB — SURGICAL PCR SCREEN
MRSA, PCR: NEGATIVE
Staphylococcus aureus: NEGATIVE

## 2022-08-02 NOTE — Progress Notes (Addendum)
Surgical Instructions    Your procedure is scheduled on July 16.  Report to [facility] Main Entrance "A" at 1 P.M., then check in with the Admitting office.  Call this number if you have problems the day of surgery:  [phone]   If you have any questions prior to your surgery date call [phone]: Open Monday-Friday 8am-4pm If you experience any cold or flu symptoms such as cough, fever, chills, shortness of breath, etc. between now and your scheduled surgery, please notify us at the above number     Remember:  Do not eat after midnight the night before your surgery  You may drink clear liquids until 12pm the day of your surgery.   Clear liquids allowed are: Water, Non-Citrus Juices (without pulp), Carbonated Beverages, Clear Tea, Black Coffee ONLY (NO MILK, CREAM OR POWDERED CREAMER of any kind), and Gatorade    Take these medicines the morning of surgery with A SIP OF WATER:  amLODipine (NORVASC)  cetirizine (ZYRTEC)  esomeprazole (NEXIUM)  Fluticasone-Salmeterol (ADVAIR)  nebivolol (BYSTOLIC)   IF NEEDED,TAKE: albuterol (PROVENTIL) (5 MG/ML) 0.5% nebulizer solution   As of today, STOP taking any Aspirin (unless otherwise instructed by your surgeon) Aleve, Naproxen, Ibuprofen, Motrin, Advil, Goody's, BC's, all herbal medications, fish oil, and all vitamins.  STOP YOUR XARELTO 3 DAYS BEFORE SURGERY. YOUR LAST DOSE WILL BE JULY 12 .           Do not wear jewelry or makeup. Do not wear lotions, powders, perfumes/cologne or deodorant. Do not shave 48 hours prior to surgery.  Men may shave face and neck. Do not bring valuables to the hospital. Do not wear nail polish, gel polish, artificial nails, or any other type of covering on natural nails (fingers and toes) If you have artificial nails or gel coating that need to be removed by a nail salon, please have this removed prior to surgery. Artificial nails or gel coating may interfere with anesthesia's ability to adequately  monitor your vital signs.  [place] [facility] is not responsible for any belongings or valuables.    Do NOT Smoke (Tobacco/Vaping)  24 hours prior to your procedure  If you use a CPAP at night, you may bring your mask for your overnight stay.   Contacts, glasses, hearing aids, dentures or partials may not be worn into surgery, please bring cases for these belongings   For patients admitted to the hospital, discharge time will be determined by your treatment team.   Patients discharged the day of surgery will not be allowed to drive home, and someone needs to stay with them for 24 hours.   SURGICAL WAITING ROOM VISITATION Patients having surgery or a procedure may have no more than 2 support people in the waiting area - these visitors may rotate.   Children under the age of 54 must have an adult with them who is not the patient. If the patient needs to stay at the hospital during part of their recovery, the visitor guidelines for inpatient rooms apply. Pre-op nurse will coordinate an appropriate time for 1 support person to accompany patient in pre-op.  This support person may not rotate.   Please refer to [email or web address] for the visitor guidelines for Inpatients (after your surgery is over and you are in a regular room).    Special instructions:    Oral Hygiene is also important to reduce your risk of infection.  Remember - BRUSH YOUR TEETH THE MORNING OF SURGERY WITH YOUR REGULAR TOOTHPASTE  [place]- Preparing For Surgery  Before surgery, you can play an important role. Because skin is not sterile, your skin needs to be as free of germs as possible. You can reduce the number of germs on your skin by washing with CHG (chlorahexidine gluconate) Soap before surgery.  CHG is an antiseptic cleaner which kills germs and bonds with the skin to continue killing germs even after washing.     Please do not use if you have an allergy to CHG  or antibacterial soaps. If your skin becomes reddened/irritated stop using the CHG.  Do not shave (including legs and underarms) for at least 48 hours prior to first CHG shower. It is OK to shave your face.  Please follow these instructions carefully.     Shower the NIGHT BEFORE SURGERY and the MORNING OF SURGERY with CHG Soap.   If you chose to wash your hair, wash your hair first as usual with your normal shampoo. After you shampoo, rinse your hair and body thoroughly to remove the shampoo.  Then [employer] and genitals (private parts) with your normal soap and rinse thoroughly to remove soap.  After that Use CHG Soap as you would any other liquid soap. You can apply CHG directly to the skin and wash gently with a scrungie or a clean washcloth.   Apply the CHG Soap to your body ONLY FROM THE NECK DOWN.  Do not use on open wounds or open sores. Avoid contact with your eyes, ears, mouth and genitals (private parts). Wash Face and genitals (private parts)  with your normal soap.   Wash thoroughly, paying special attention to the area where your surgery will be performed.  Thoroughly rinse your body with warm water from the neck down.  DO NOT shower/wash with your normal soap after using and rinsing off the CHG Soap.  Pat yourself dry with a CLEAN TOWEL.  Wear CLEAN PAJAMAS to bed the night before surgery  Place CLEAN SHEETS on your bed the night before your surgery  DO NOT SLEEP WITH PETS.   Day of Surgery:  Take a shower with CHG soap. Wear Clean/Comfortable clothing the morning of surgery Do not apply any deodorants/lotions.   Remember to brush your teeth WITH YOUR REGULAR TOOTHPASTE.    If you received a COVID test during your pre-op visit, it is requested that you wear a mask when out in public, stay away from anyone that may not be feeling well, and notify your surgeon if you develop symptoms. If you have been in contact with anyone that has tested positive in the last 10  days, please notify your surgeon.    Please read over the following fact sheets that you were given.

## 2022-08-02 NOTE — Progress Notes (Addendum)
PCP - Ravisankar Avva,MD Cardiologist - Dr. Verne Carrow  PPM/ICD - denies Device Orders -  Rep Notified -   Chest x-ray -  EKG - 12/30/21 Stress Test -  ECHO - 08/04/17 Cardiac Cath -   Sleep Study - denies CPAP -   Fasting Blood Sugar - na Checks Blood Sugar _____ times a day  Last dose of GLP1 agonist-  na GLP1 instructions:   Blood Thinner Instructions:Pt states her last dose of Xarelto will be 7/12 per Dr. Odessa Fleming instructions.  Aspirin Instructions:na  ERAS Protcol -clear liquids until 12pm PRE-SURGERY Ensure or G2- no  COVID TEST- na   Anesthesia review: yes- cardiac clearance- hx SVT,murmur  Patient denies shortness of breath, fever, cough and chest pain at PAT appointment   All instructions explained to the patient, with a verbal understanding of the material. Patient agrees to go over the instructions while at home for a better understanding. Patient also instructed to wear a mask when out in public prior to surgery. The opportunity to ask questions was provided.

## 2022-08-03 NOTE — Anesthesia Preprocedure Evaluation (Addendum)
Anesthesia Evaluation  Patient identified by MRN, date of birth, ID band Patient awake    Reviewed: Allergy & Precautions, NPO status , Patient's Chart, lab work & pertinent test results  History of Anesthesia Complications Negative for: history of anesthetic complications  Airway Mallampati: III  TM Distance: >3 FB Neck ROM: Full    Dental no notable dental hx. (+) Teeth Intact, Dental Advisory Given   Pulmonary asthma , former smoker   Pulmonary exam normal breath sounds clear to auscultation       Cardiovascular hypertension, Pt. on medications and Pt. on home beta blockers Normal cardiovascular exam+ dysrhythmias Atrial Fibrillation and Supra Ventricular Tachycardia  Rhythm:Regular Rate:Normal  TTE 2019 - Left ventricle: The cavity size was normal. Wall thickness was  increased in a pattern of mild LVH. Systolic function was normal.  The estimated ejection fraction was in the range of 55% to 60%.  Wall motion was normal; there were no regional wall motion  abnormalities. Indeterminate diastolic function.  - Aortic valve: Mildly calcified annulus. Trileaflet; mildly  calcified leaflets.  - Mitral valve: Mildly calcified annulus. There was mild  regurgitation.  - Left atrium: The atrium was at the upper limits of normal in  size.  - Right atrium: The atrium was mildly dilated. Central venous  pressure (est): 3 mm Hg.  - Atrial septum: No defect or patent foramen ovale was identified.  - Tricuspid valve: There was mild regurgitation.  - Pulmonary arteries: PA peak pressure: 23 mm Hg (S).  - Pericardium, extracardiac: A small pericardial effusion was  identified posterior to the heart.    Neuro/Psych   Anxiety     Chiari malformation    GI/Hepatic Neg liver ROS,GERD  ,,  Endo/Other  negative endocrine ROS    Renal/GU negative Renal ROS  negative genitourinary   Musculoskeletal  (+) Arthritis ,  Osteoarthritis,    Abdominal  (+) + obese  Peds  Hematology negative hematology ROS (+) Eliquis   Anesthesia Other Findings   Reproductive/Obstetrics                             Anesthesia Physical Anesthesia Plan  ASA: 3  Anesthesia Plan: General   Post-op Pain Management: Tylenol PO (pre-op)*   Induction: Intravenous  PONV Risk Score and Plan: 3 and Treatment may vary due to age or medical condition, Ondansetron, Dexamethasone and Midazolam  Airway Management Planned: LMA  Additional Equipment: None  Intra-op Plan:   Post-operative Plan: Extubation in OR  Informed Consent:   Plan Discussed with:   Anesthesia Plan Comments: (PAT note by Antionette Poles, [name]: Follows cardiology for history of  WCT (probable SVT), Aflutter,PAF, HFpEF, and HTN.  Seen by Robin Searing, NP on 07/24/2022 for preop evaluation.  Per note, "Patient's RCRI score is 0.9%. The patient affirms she has been doing well without any new cardiac symptoms. They are able to achieve 5 METS without cardiac limitations. Therefore, based on ACC/AHA guidelines, the patient would be at acceptable risk for the planned procedure without further cardiovascular testing. The patient was advised that if she develops new symptoms prior to surgery to contact our office to arrange for a follow-up visit, and she verbalized understanding.  The patient was advised that if she develops new symptoms prior to surgery to contact our office to arrange for a follow-up visit, and she verbalized understanding. Patient was advised to stop Xarelto 2 to 3 days prior to procedure and  should restart postprocedure when surgically safe and hemostasis is achieved."  Patient reports last dose Xarelto 08/04/2022.  History of left breast cancer in 1999 treated with lumpectomy and radiation.  CMP 07/24/2022 from PCP office reviewed, unremarkable, creatinine 0.8, sodium 139, potassium 4.0.  CBC 07/24/2022 from PCP office reviewed,  chronic leukocytopenia with WBC 2.84, chronic anemia with hemoglobin 11.2, otherwise unremarkable, platelets 195.  EKG 12/30/2021: NSR with sinus arrhythmia.  Minimal voltage criteria for LVH, may be normal variant.  Nonspecific ST abnormality.  Rate 81.  TTE 08/04/2017: - Left ventricle: The cavity size was normal. Wall thickness was  increased in a pattern of mild LVH. Systolic function was normal.  The estimated ejection fraction was in the range of 55% to 60%.  Wall motion was normal; there were no regional wall motion  abnormalities. Indeterminate diastolic function.  - Aortic valve: Mildly calcified annulus. Trileaflet; mildly  calcified leaflets.  - Mitral valve: Mildly calcified annulus. There was mild  regurgitation.  - Left atrium: The atrium was at the upper limits of normal in  size.  - Right atrium: The atrium was mildly dilated. Central venous  pressure (est): 3 mm Hg.  - Atrial septum: No defect or patent foramen ovale was identified.  - Tricuspid valve: There was mild regurgitation.  - Pulmonary arteries: PA peak pressure: 23 mm Hg (S).  - Pericardium, extracardiac: A small pericardial effusion was  identified posterior to the heart.    )        Anesthesia Quick Evaluation

## 2022-08-03 NOTE — Progress Notes (Signed)
Anesthesia Chart Review:  Follows cardiology for history of  WCT (probable SVT), Aflutter,PAF, HFpEF, and HTN.  Seen by Robin Searing, NP on 07/24/2022 for preop evaluation.  Per note, "Patient's RCRI score is 0.9%. The patient affirms she has been doing well without any new cardiac symptoms. They are able to achieve 5 METS without cardiac limitations. Therefore, based on ACC/AHA guidelines, the patient would be at acceptable risk for the planned procedure without further cardiovascular testing. The patient was advised that if she develops new symptoms prior to surgery to contact our office to arrange for a follow-up visit, and she verbalized understanding.  The patient was advised that if she develops new symptoms prior to surgery to contact our office to arrange for a follow-up visit, and she verbalized understanding. Patient was advised to stop Xarelto 2 to 3 days prior to procedure and should restart postprocedure when surgically safe and hemostasis is achieved."  Patient reports last dose Xarelto 08/04/2022.  History of left breast cancer in 1999 treated with lumpectomy and radiation.  CMP 07/24/2022 from PCP office reviewed, unremarkable, creatinine 0.8, sodium 139, potassium 4.0.  CBC 07/24/2022 from PCP office reviewed, chronic leukocytopenia with WBC 2.84, chronic anemia with hemoglobin 11.2, otherwise unremarkable, platelets 195.  EKG 12/30/2021: NSR with sinus arrhythmia.  Minimal voltage criteria for LVH, may be normal variant.  Nonspecific ST abnormality.  Rate 81.  TTE 08/04/2017: - Left ventricle: The cavity size was normal. Wall thickness was    increased in a pattern of mild LVH. Systolic function was normal.    The estimated ejection fraction was in the range of 55% to 60%.    Wall motion was normal; there were no regional wall motion    abnormalities. Indeterminate diastolic function.  - Aortic valve: Mildly calcified annulus. Trileaflet; mildly    calcified leaflets.  - Mitral valve:  Mildly calcified annulus. There was mild    regurgitation.  - Left atrium: The atrium was at the upper limits of normal in    size.  - Right atrium: The atrium was mildly dilated. Central venous    pressure (est): 3 mm Hg.  - Atrial septum: No defect or patent foramen ovale was identified.  - Tricuspid valve: There was mild regurgitation.  - Pulmonary arteries: PA peak pressure: 23 mm Hg (S).  - Pericardium, extracardiac: A small pericardial effusion was    identified posterior to the heart.     Zannie Cove [facility] Short Stay Center/Anesthesiology Phone [phone] 08/03/2022 10:30 AM

## 2022-08-08 ENCOUNTER — Encounter (HOSPITAL_COMMUNITY)
Admission: RE | Disposition: A | Discharge status: Home / Self Care | Payer: Self-pay | Source: Home / Self Care | Attending: General Surgery

## 2022-08-08 ENCOUNTER — Ambulatory Visit (HOSPITAL_COMMUNITY): Payer: Medicare Other | Admitting: Physician Assistant

## 2022-08-08 ENCOUNTER — Ambulatory Visit (HOSPITAL_COMMUNITY)
Admission: RE | Admit: 2022-08-08 | Discharge: 2022-08-08 | Disposition: A | Discharge status: Home / Self Care | Payer: Medicare Other | Attending: General Surgery | Admitting: General Surgery

## 2022-08-08 ENCOUNTER — Ambulatory Visit (HOSPITAL_BASED_OUTPATIENT_CLINIC_OR_DEPARTMENT_OTHER): Payer: Medicare Other | Admitting: Anesthesiology

## 2022-08-08 ENCOUNTER — Other Ambulatory Visit: Payer: Self-pay

## 2022-08-08 DIAGNOSIS — I5030 Unspecified diastolic (congestive) heart failure: Secondary | ICD-10-CM

## 2022-08-08 DIAGNOSIS — Z853 Personal history of malignant neoplasm of breast: Secondary | ICD-10-CM | POA: Insufficient documentation

## 2022-08-08 DIAGNOSIS — Z923 Personal history of irradiation: Secondary | ICD-10-CM | POA: Insufficient documentation

## 2022-08-08 DIAGNOSIS — N6452 Nipple discharge: Secondary | ICD-10-CM | POA: Insufficient documentation

## 2022-08-08 DIAGNOSIS — I110 Hypertensive heart disease with heart failure: Secondary | ICD-10-CM

## 2022-08-08 DIAGNOSIS — I10 Essential (primary) hypertension: Secondary | ICD-10-CM | POA: Insufficient documentation

## 2022-08-08 DIAGNOSIS — N6031 Fibrosclerosis of right breast: Secondary | ICD-10-CM | POA: Diagnosis not present

## 2022-08-08 DIAGNOSIS — Z1231 Encounter for screening mammogram for malignant neoplasm of breast: Secondary | ICD-10-CM | POA: Diagnosis not present

## 2022-08-08 DIAGNOSIS — K219 Gastro-esophageal reflux disease without esophagitis: Secondary | ICD-10-CM | POA: Diagnosis not present

## 2022-08-08 DIAGNOSIS — I4891 Unspecified atrial fibrillation: Secondary | ICD-10-CM | POA: Diagnosis not present

## 2022-08-08 DIAGNOSIS — Z87891 Personal history of nicotine dependence: Secondary | ICD-10-CM | POA: Diagnosis not present

## 2022-08-08 HISTORY — PX: BREAST DUCTAL SYSTEM EXCISION: SHX5242

## 2022-08-08 SURGERY — EXCISION DUCTAL SYSTEM BREAST
Anesthesia: General | Site: Breast | Laterality: Right

## 2022-08-08 MED ORDER — CEFAZOLIN SODIUM-DEXTROSE 2-4 GM/100ML-% IV SOLN
2.0000 g | INTRAVENOUS | Status: AC
  Administered 2022-08-08: 2 g via INTRAVENOUS
  Filled 2022-08-08: qty 100

## 2022-08-08 MED ORDER — CEFAZOLIN SODIUM-DEXTROSE 2-4 GM/100ML-% IV SOLN
INTRAVENOUS | Status: AC
  Filled 2022-08-08: qty 100

## 2022-08-08 MED ORDER — DEXAMETHASONE SODIUM PHOSPHATE 10 MG/ML IJ SOLN
INTRAMUSCULAR | Status: AC
  Filled 2022-08-08: qty 1

## 2022-08-08 MED ORDER — 0.9 % SODIUM CHLORIDE (POUR BTL) OPTIME
TOPICAL | Status: DC | PRN
  Administered 2022-08-08: 1000 mL

## 2022-08-08 MED ORDER — LIDOCAINE HCL 1 % IJ SOLN
INTRAMUSCULAR | Status: DC | PRN
  Administered 2022-08-08: 40 mL

## 2022-08-08 MED ORDER — ONDANSETRON HCL 4 MG/2ML IJ SOLN
INTRAMUSCULAR | Status: DC | PRN
  Administered 2022-08-08: 4 mg via INTRAVENOUS

## 2022-08-08 MED ORDER — MIDAZOLAM HCL 2 MG/2ML IJ SOLN
INTRAMUSCULAR | Status: AC
  Filled 2022-08-08: qty 2

## 2022-08-08 MED ORDER — ACETAMINOPHEN 500 MG PO TABS
ORAL_TABLET | ORAL | Status: AC
  Administered 2022-08-08: 1000 mg via ORAL
  Filled 2022-08-08: qty 2

## 2022-08-08 MED ORDER — LIDOCAINE 2% (20 MG/ML) 5 ML SYRINGE
INTRAMUSCULAR | Status: AC
  Filled 2022-08-08: qty 5

## 2022-08-08 MED ORDER — AMISULPRIDE (ANTIEMETIC) 5 MG/2ML IV SOLN: 10.0000 mg | Freq: Once | INTRAVENOUS | Status: DC | PRN

## 2022-08-08 MED ORDER — PHENYLEPHRINE 80 MCG/ML (10ML) SYRINGE FOR IV PUSH (FOR BLOOD PRESSURE SUPPORT)
PREFILLED_SYRINGE | INTRAVENOUS | Status: AC
  Filled 2022-08-08: qty 10

## 2022-08-08 MED ORDER — HYDROMORPHONE HCL 1 MG/ML IJ SOLN
0.2500 mg | INTRAMUSCULAR | Status: DC | PRN
  Administered 2022-08-08: 0.5 mg via INTRAVENOUS
  Administered 2022-08-08: 0.25 mg via INTRAVENOUS

## 2022-08-08 MED ORDER — MIDAZOLAM HCL 2 MG/2ML IJ SOLN
INTRAMUSCULAR | Status: DC | PRN
  Administered 2022-08-08: 1 mg via INTRAVENOUS

## 2022-08-08 MED ORDER — LIDOCAINE HCL 1 % IJ SOLN
INTRAMUSCULAR | Status: AC
  Filled 2022-08-08: qty 20

## 2022-08-08 MED ORDER — CHLORHEXIDINE GLUCONATE CLOTH 2 % EX PADS: 6.0000 | MEDICATED_PAD | Freq: Once | CUTANEOUS | Status: DC

## 2022-08-08 MED ORDER — ALBUTEROL SULFATE HFA 108 (90 BASE) MCG/ACT IN AERS
INHALATION_SPRAY | RESPIRATORY_TRACT | Status: DC | PRN
  Administered 2022-08-08: 4 via RESPIRATORY_TRACT

## 2022-08-08 MED ORDER — OXYCODONE HCL 5 MG PO TABS: 5.0000 mg | ORAL_TABLET | Freq: Once | ORAL | Status: DC | PRN

## 2022-08-08 MED ORDER — FENTANYL CITRATE (PF) 250 MCG/5ML IJ SOLN
INTRAMUSCULAR | Status: DC | PRN
  Administered 2022-08-08: 75 ug via INTRAVENOUS
  Administered 2022-08-08: 100 ug via INTRAVENOUS
  Administered 2022-08-08: 25 ug via INTRAVENOUS

## 2022-08-08 MED ORDER — PROPOFOL 10 MG/ML IV BOLUS
INTRAVENOUS | Status: DC | PRN
Start: 2022-08-08 — End: 2022-08-08
  Administered 2022-08-08: 200 mg via INTRAVENOUS

## 2022-08-08 MED ORDER — LIDOCAINE 2% (20 MG/ML) 5 ML SYRINGE
INTRAMUSCULAR | Status: DC | PRN
  Administered 2022-08-08: 100 mg via INTRAVENOUS

## 2022-08-08 MED ORDER — ORAL CARE MOUTH RINSE: 15.0000 mL | Freq: Once | OROMUCOSAL | Status: AC

## 2022-08-08 MED ORDER — ACETAMINOPHEN 500 MG PO TABS: 1000.0000 mg | ORAL_TABLET | ORAL | Status: AC

## 2022-08-08 MED ORDER — ONDANSETRON HCL 4 MG/2ML IJ SOLN
INTRAMUSCULAR | Status: AC
  Filled 2022-08-08: qty 2

## 2022-08-08 MED ORDER — HYDROMORPHONE HCL 1 MG/ML IJ SOLN
INTRAMUSCULAR | Status: AC
  Filled 2022-08-08: qty 1

## 2022-08-08 MED ORDER — CHLORHEXIDINE GLUCONATE 0.12 % MT SOLN
15.0000 mL | Freq: Once | OROMUCOSAL | Status: AC
  Administered 2022-08-08: 15 mL via OROMUCOSAL
  Filled 2022-08-08: qty 15

## 2022-08-08 MED ORDER — BUPIVACAINE-EPINEPHRINE (PF) 0.25% -1:200000 IJ SOLN
INTRAMUSCULAR | Status: AC
  Filled 2022-08-08: qty 30

## 2022-08-08 MED ORDER — OXYCODONE HCL 5 MG PO TABS: 2.5000 mg | ORAL_TABLET | Freq: Four times a day (QID) | ORAL | 0 refills | Status: DC | PRN

## 2022-08-08 MED ORDER — LACTATED RINGERS IV SOLN: INTRAVENOUS | Status: DC

## 2022-08-08 MED ORDER — OXYCODONE HCL 5 MG/5ML PO SOLN: 5.0000 mg | Freq: Once | ORAL | Status: DC | PRN

## 2022-08-08 MED ORDER — PROMETHAZINE HCL 25 MG/ML IJ SOLN: 6.2500 mg | INTRAMUSCULAR | Status: DC | PRN

## 2022-08-08 MED ORDER — FENTANYL CITRATE (PF) 250 MCG/5ML IJ SOLN
INTRAMUSCULAR | Status: AC
  Filled 2022-08-08: qty 5

## 2022-08-08 MED ORDER — PROPOFOL 10 MG/ML IV BOLUS
INTRAVENOUS | Status: AC
  Filled 2022-08-08: qty 20

## 2022-08-08 SURGICAL SUPPLY — 43 items
ADH SKN CLS APL DERMABOND .7 (GAUZE/BANDAGES/DRESSINGS) ×1
APL PRP STRL LF DISP 70% ISPRP (MISCELLANEOUS) ×1
BAG COUNTER SPONGE SURGICOUNT (BAG) ×1 IMPLANT
BAG SPNG CNTER NS LX DISP (BAG) ×1
BINDER BREAST 3XL (GAUZE/BANDAGES/DRESSINGS) IMPLANT
BINDER BREAST LRG (GAUZE/BANDAGES/DRESSINGS) IMPLANT
BINDER BREAST XLRG (GAUZE/BANDAGES/DRESSINGS) IMPLANT
CANISTER SUCT 3000ML PPV (MISCELLANEOUS) IMPLANT
CHLORAPREP W/TINT 26 (MISCELLANEOUS) ×1 IMPLANT
CLIP TI LARGE 6 (CLIP) ×1 IMPLANT
COVER PROBE W GEL 5X96 (DRAPES) ×1 IMPLANT
COVER SURGICAL LIGHT HANDLE (MISCELLANEOUS) ×1 IMPLANT
DERMABOND ADVANCED .7 DNX12 (GAUZE/BANDAGES/DRESSINGS) ×1 IMPLANT
DEVICE DUBIN SPECIMEN MAMMOGRA (MISCELLANEOUS) ×1 IMPLANT
DRAPE CHEST BREAST 15X10 FENES (DRAPES) ×1 IMPLANT
ELECT COATED BLADE 2.86 ST (ELECTRODE) ×1 IMPLANT
ELECT REM PT RETURN 9FT ADLT (ELECTROSURGICAL) ×1
ELECTRODE REM PT RTRN 9FT ADLT (ELECTROSURGICAL) ×1 IMPLANT
GAUZE PAD ABD 8X10 STRL (GAUZE/BANDAGES/DRESSINGS) ×1 IMPLANT
GAUZE SPONGE 4X4 12PLY STRL LF (GAUZE/BANDAGES/DRESSINGS) ×1 IMPLANT
GLOVE BIO SURGEON STRL SZ 6 (GLOVE) ×1 IMPLANT
GLOVE INDICATOR 6.5 STRL GRN (GLOVE) ×1 IMPLANT
GOWN STRL REUS W/ TWL LRG LVL3 (GOWN DISPOSABLE) ×1 IMPLANT
GOWN STRL REUS W/ TWL XL LVL3 (GOWN DISPOSABLE) ×1 IMPLANT
GOWN STRL REUS W/TWL LRG LVL3 (GOWN DISPOSABLE) ×1
GOWN STRL REUS W/TWL XL LVL3 (GOWN DISPOSABLE) ×1
KIT BASIN OR (CUSTOM PROCEDURE TRAY) ×1 IMPLANT
KIT MARKER MARGIN INK (KITS) ×1 IMPLANT
LIGHT WAVEGUIDE WIDE FLAT (MISCELLANEOUS) IMPLANT
NDL HYPO 25GX1X1/2 BEV (NEEDLE) ×1 IMPLANT
NEEDLE HYPO 25GX1X1/2 BEV (NEEDLE) ×1
NS IRRIG 1000ML POUR BTL (IV SOLUTION) IMPLANT
PACK GENERAL/GYN (CUSTOM PROCEDURE TRAY) ×1 IMPLANT
STRIP CLOSURE SKIN 1/2X4 (GAUZE/BANDAGES/DRESSINGS) ×1 IMPLANT
SUT MNCRL AB 4-0 PS2 18 (SUTURE) ×1 IMPLANT
SUT SILK 2 0 SH (SUTURE) IMPLANT
SUT VIC AB 2-0 SH 27 (SUTURE)
SUT VIC AB 2-0 SH 27XBRD (SUTURE) IMPLANT
SUT VIC AB 3-0 SH 27 (SUTURE) ×1
SUT VIC AB 3-0 SH 27X BRD (SUTURE) ×1 IMPLANT
SYR CONTROL 10ML LL (SYRINGE) ×1 IMPLANT
TOWEL GREEN STERILE (TOWEL DISPOSABLE) ×1 IMPLANT
TOWEL GREEN STERILE FF (TOWEL DISPOSABLE) ×1 IMPLANT

## 2022-08-08 NOTE — Anesthesia Postprocedure Evaluation (Signed)
Anesthesia Post Note  Patient: Bridget Cortez  Procedure(s) Performed: RIGHT BREAST CENTRAL DUCT EXCISION (Right: Breast)     Patient location during evaluation: PACU Anesthesia Type: General Level of consciousness: awake and alert Pain management: pain level controlled Vital Signs Assessment: post-procedure vital signs reviewed and stable Respiratory status: spontaneous breathing, nonlabored ventilation and respiratory function stable Cardiovascular status: blood pressure returned to baseline and stable Postop Assessment: no apparent nausea or vomiting Anesthetic complications: no   No notable events documented.  Last Vitals:  Vitals:   08/08/22 1600 08/08/22 1615  BP: 123/76 117/83  Pulse: 60 (!) 58  Resp: 13 11  Temp:  36.5 C  SpO2: 95% 93%    Last Pain:  Vitals:   08/08/22 1615  TempSrc:   PainSc: 0-No pain                 Lowella Curb

## 2022-08-08 NOTE — Anesthesia Procedure Notes (Signed)
Procedure Name: LMA Insertion Date/Time: 08/08/2022 2:35 PM  Performed by: Kayleen Memos, CRNAPre-anesthesia Checklist: Patient identified, Emergency Drugs available, Suction available and Patient being monitored Patient Re-evaluated:Patient Re-evaluated prior to induction Oxygen Delivery Method: Circle System Utilized Preoxygenation: Pre-oxygenation with 100% oxygen Induction Type: IV induction Ventilation: Mask ventilation without difficulty LMA: LMA inserted LMA Size: 4.0 Number of attempts: 1 Airway Equipment and Method: Bite block Placement Confirmation: positive ETCO2 Tube secured with: Tape Dental Injury: Teeth and Oropharynx as per pre-operative assessment and Injury to lip

## 2022-08-08 NOTE — Discharge Instructions (Addendum)
RESTART XARELTO TOMORROW.      [facility] Office Phone Number [phone]  BREAST BIOPSY/ PARTIAL MASTECTOMY: POST OP INSTRUCTIONS  Always review your discharge instruction sheet given to you by the facility where your surgery was performed.  IF YOU HAVE DISABILITY OR FAMILY LEAVE FORMS, YOU MUST BRING THEM TO THE OFFICE FOR PROCESSING.  DO NOT GIVE THEM TO YOUR DOCTOR.  Take 2 tylenol (acetominophen) three times a day for 3 days.  If you still have pain, add ibuprofen with food in between if able to take this (if you have kidney issues or stomach issues, do not take ibuprofen).  If both of those are not enough, add the narcotic pain pill.  If you find you are needing a lot of this overnight after surgery, call the next morning for a refill.    Prescriptions will not be filled after 5pm or on week-ends. Take your usually prescribed medications unless otherwise directed You should eat very light the first 24 hours after surgery, such as soup, crackers, pudding, etc.  Resume your normal diet the day after surgery. Most patients will experience some swelling and bruising in the breast.  Ice packs and a good support bra will help.  Swelling and bruising can take several days to resolve.  It is common to experience some constipation if taking pain medication after surgery.  Increasing fluid intake and taking a stool softener will usually help or prevent this problem from occurring.  A mild laxative (Milk of Magnesia or Miralax) should be taken according to package directions if there are no bowel movements after 48 hours. Unless discharge instructions indicate otherwise, you may remove your bandages 48 hours after surgery, and you may shower at that time.  You may have steri-strips (small skin tapes) in place directly over the incision.  These strips should be left on the skin at least for for 7-10 days.    ACTIVITIES:  You may resume regular daily activities (gradually increasing)  beginning the next day.  Wearing a good support bra or sports bra (or the breast binder) minimizes pain and swelling.  You may have sexual intercourse when it is comfortable. No heavy lifting for 1-2 weeks (not over around 10 pounds).  You may drive when you no longer are taking prescription pain medication, you can comfortably wear a seatbelt, and you can safely maneuver your car and apply brakes. RETURN TO WORK:  __________3-14 days depending on job. _______________ Bonita Quin should see your doctor in the office for a follow-up appointment approximately two weeks after your surgery.  Your doctor's nurse will typically make your follow-up appointment when she calls you with your pathology report.  Expect your pathology report 3-4 business days after your surgery.  You may call to check if you do not hear from [place] after three days.   WHEN TO CALL YOUR DOCTOR: Fever over 101.0 Nausea and/or vomiting. Extreme swelling or bruising. Continued bleeding from incision. Increased pain, redness, or drainage from the incision.  The clinic staff is available to answer your questions during regular business hours.  Please don't hesitate to call and ask to speak to one of the nurses for clinical concerns.  If you have a medical emergency, go to the nearest emergency room or call 911.  A surgeon from [facility] is always on call at the hospital.  For further questions, please visit [email or web address]

## 2022-08-08 NOTE — Interval H&P Note (Signed)
History and Physical Interval Note:  08/08/2022 2:04 PM  Bridget Cortez  has presented today for surgery, with the diagnosis of BLOODY NIPPLE DISCHARGE, RIGHT.  The various methods of treatment have been discussed with the patient and family. After consideration of risks, benefits and other options for treatment, the patient has consented to  Procedure(s): RIGHT BREAST CENTRAL DUCT EXCISION (Right) as a surgical intervention.  The patient's history has been reviewed, patient examined, no change in status, stable for surgery.  I have reviewed the patient's chart and labs.  Questions were answered to the patient's satisfaction.     Almond Lint

## 2022-08-08 NOTE — H&P (Signed)
PROVIDER: Matthias Hughs, MD Patient Care Team: Hoyle Sauer, MD as PCP - General (Internal Medicine)  MRN: [id number] DOB: 10-11-1945 DATE OF ENCOUNTER: 07/17/2022  Chief Complaint: R nipple bloody discharge   History of Present Illness: Bridget Cortez is a 77 y.o. female who is seen today for right bloody nipple discharge   Initial history:   Patient presented to me in 2020 with right bloody nipple discharge. I did a major duct excision with excision of a 5 mm lesion that was benign. This was complicated by bleeding from her incision. Nipple discharge resolved. She had previous history of left breast cancer in 1999 treated with lumpectomy, radiation, and 10 years of tamoxifen followed by [facility].  She presents now with right bloody nipple discharge again 03/2022. She thought that this time she had a small skin lesion on her nipple. However she was of course quite concerned. She had a negative screening mammogram in January2024 at the breast center. She also had some pain at the site.   Interval history:   She had repeat mammo/us and this was non diagnostic. Subsequently underwent breast MRI. This demonstrated a region of non-mass enhancement directly behind the nipple that was 2.3 cm in greatest dimension. There is a surgical clip nearby. Patient denies any current breast pain.  Breast MR 07/12/22 Right breast: Non-mass enhancement directly behind the nipple extending into the inner subareolar location spanning approximately 0.7 x 2.3 x 1.9 cm (AP x transverse x craniocaudal), demonstrating plateau kinetics. Blooming artifact from the surgical clip in the outer subareolar location.  Left breast: No suspicious mass or abnormal enhancement.  Lymph nodes: No abnormal appearing lymph nodes.  Ancillary findings: None.  IMPRESSION: 1. Non-mass enhancement directly behind the RIGHT nipple extending into the inner subareolar location. Measurements are given above. 2. No  MRI evidence of malignancy involving the LEFT breast.  RECOMMENDATION: 1. Consider RIGHT breast central duct excision if the spontaneous bloody nipple discharge persists. The non-mass enhancement directly behind the nipple would not be amenable to MRI guided biopsy and there was no correlate on the recent RIGHT breast ultrasound behind the nipple. 2. The patient is due for annual mammography in January, 2025.  BI-RADS CATEGORY 4: Suspicious.  Review of Systems: A complete review of systems was obtained from the patient. I have reviewed this information and discussed as appropriate with the patient. See HPI as well for other ROS.  Review of Systems  Musculoskeletal: Positive for joint pain.  All other systems reviewed and are negative.   Medical History: Past Medical History:  Diagnosis Date  Asthma, unspecified asthma severity, unspecified whether complicated, unspecified whether persistent (HHS-HCC)  History of cancer  Hypertension   Patient Active Problem List  Diagnosis  History of left breast cancer  Bloody discharge from right nipple   Past Surgical History:  Procedure Laterality Date  HYSTERECTOMY 1981  RADIOACTIVE SEED GUIDED EXCISIONAL RIGHT BREAST BIOPSY 07/09/2018  Dr. Donell Beers  RIGHT TOTAL KNEE ARTHROPLASTY 09/09/2019  Dr. Jerl Santos  CARDIOVERSION EXTERNAL 07/28/2020  Dr. Bjorn Pippin    Allergies  Allergen Reactions  Ephedrine Other (See Comments) and Shortness Of Breath  Cortisone Other (See Comments)  Severe muscle spasms   Current Outpatient Medications on File Prior to Visit  Medication Sig Dispense Refill  albuterol MDI, PROVENTIL, VENTOLIN, PROAIR, HFA 90 mcg/actuation inhaler INHALE 2 PUFFS BY MOUTH EVERY 4 TO 6 HOURS AS NEEDED FOR WHEEZING AND FOR SHORTNESS OF BREATH  amLODIPine (NORVASC) 10 MG tablet Take 1 tablet by  mouth once daily  ascorbic acid, vitamin C, (VITAMIN C) 500 MG tablet Take by mouth  cetirizine (ZYRTEC) 10 MG tablet Take 10 mg by  mouth once daily  esomeprazole (NEXIUM) 40 MG DR capsule TAKE 1 CAPSULE BY MOUTH TWICE DAILY 30 MINUTES BEFORE BREAKFAST AND 30 MINUTES BEFORE DINNER FOR 90 DAYS  fluticasone propion-salmeteroL (ADVAIR DISKUS) 250-50 mcg/dose diskus inhaler INHALE 1 DOSE BY MOUTH TWICE DAILY AS DIRECTED  KLOR-CON M20 20 mEq ER tablet TAKE 1 BY MOUTH THREE TIMES DAILY  multivitamin tablet Take 1 tablet by mouth daily with lunch  nebivoloL (BYSTOLIC) 5 MG tablet Take 1 tablet by mouth once daily  rosuvastatin (CRESTOR) 5 MG tablet Take 1 tablet by mouth once daily  simethicone (MYLICON) 125 MG chewable tablet Take by mouth  telmisartan (MICARDIS) 40 MG tablet Take 1 tablet by mouth once daily  TORsemide (DEMADEX) 20 MG tablet Take 1 tablet by mouth once daily  traMADoL (ULTRAM) 50 mg tablet Take 50 mg by mouth  traZODone (DESYREL) 50 MG tablet  XARELTO 20 mg tablet Take 1 tablet by mouth daily with dinner   No current facility-administered medications on file prior to visit.   Family History  Problem Relation Age of Onset  High blood pressure (Hypertension) Sister  Breast cancer Sister  Stroke Brother    Social History   Tobacco Use  Smoking Status Former  Current packs/day: 0.00  Types: Cigarettes  Quit date: 1982  Years since quitting: 42.5  Smokeless Tobacco Never    Social History   Socioeconomic History  Marital status: Married  Tobacco Use  Smoking status: Former  Current packs/day: 0.00  Types: Cigarettes  Quit date: 1982  Years since quitting: 42.5  Smokeless tobacco: Never  Substance and Sexual Activity  Alcohol use: Never  Drug use: Never   Social Determinants of Health   Received from [employer]  Social Network   Objective:   Vitals:  07/17/22 1117  PainSc: 0-No pain   There is no height or weight on file to calculate BMI.  Left breast is smaller than the right with radiation changes. There is some faint left breast lymphedema. Right breast does have bloody  nipple discharge with squeezes right at the nipple. It is difficult to say but there may be a scaly lesion on the center of the nipple. There are no palpable masses. Her inferior circumareolar incision is well-healed. She has no appreciable seroma. No lymphadenopathy  Head: Normocephalic and atraumatic.  Eyes: Conjunctivae are normal. Pupils are equal, round, and reactive to light. No scleral icterus.  Resp: No respiratory distress, normal effort. Breast: NO significant change. Left breast is smaller than the right with radiation changes. There is some faint left breast lymphedema. I cannot express discharge today. Her inferior circumareolar incision is well-healed. She has no appreciable seroma. No lymphadenopathy    Labs, Imaging and Diagnostic Testing:  N/a  Assessment and Plan:   Diagnoses and all orders for this visit:  Bloody discharge from right nipple  History of left breast cancer  Given patient's history of bloody nipple discharge and non-mass enhancement, I will plan to excise the region behind the nipple. There is a risk of this being malignant. I discussed this with the patient. We do not need any preoperative marker as the tissue is directly behind the nipple. I will need to ask cardiology about holding her Xarelto and whether any preoperative testing is needed for restratification.  The surgical procedure was described to the patient.  I discussed the incision type and location We discussed the risks bleeding, infection, damage to other structures, need for further procedures/surgeries. We discussed the risk of seroma. The patient was advised if the breast has cancer, we may need to go back to surgery for additional tissue to obtain negative margins or for a lymph node biopsy. The patient was advised that these are the most common complications, but that others can occur as well. I discussed the risk of alteration in breast contour or size. I discussed risk of chronic pain.  There are rare instances of heart/lung issues post op as well as blood clots.   They were advised against taking aspirin or other anti-inflammatory agents/blood thinners just prior to surgery.   The risks and benefits of the procedure were described to the patient and she wishes to proceed.

## 2022-08-08 NOTE — Op Note (Signed)
Right major duct excision   Indications: This patient presents with history of right bloody nipple discharge   Pre-operative Diagnosis: right bloody nipple discharge   Post-operative Diagnosis: right bloody nipple discharge   Surgeon: Almond Lint    Anesthesia: General LMA anesthesia   ASA Class: 2   Procedure Details  The patient was seen in the Holding Room. The risks, benefits, complications, treatment options, and expected outcomes were discussed with the patient. The possibilities of reaction to medication, pulmonary aspiration, bleeding, infection, the need for additional procedures, failure to diagnose a condition, and creating a complication requiring transfusion or operation were discussed with the patient. The patient concurred with the proposed plan, giving informed consent.  The site of surgery properly noted/marked. The patient was taken to Operating Room # 2, identified, and the procedure verified as Breast Excisional Biopsy. A Time Out was held and the above information confirmed.   After induction of anesthesia, the right  breast and chest were prepped and draped in standard fashion. The duct with the discharge was cannulated with a lacrimal duct probe.  A circumareolar incision was made over the  lower nipple .  Dissection was carried down around the probe.  A generous amount of tissue was taken around the probe. The specimen was marked with the margin marker paint kit.  Hemostasis was achieved with cautery.  The wound was irrigated and closed with a 3-0 Vicryl interrupted deep dermal stitch and a 4-0 Monocryl subcuticular closure in layers.    Sterile dressings were applied. At the end of the operation, all sponge, instrument, and needle counts were correct.   Findings: grossly clear surgical margins   Estimated Blood Loss:  Minimal           Specimens: right major duct excision         Complications:  None; patient tolerated the procedure well.         Disposition:  PACU - hemodynamically stable.         Condition: stable

## 2022-08-08 NOTE — Transfer of Care (Signed)
Immediate Anesthesia Transfer of Care Note  Patient: Bridget Cortez  Procedure(s) Performed: RIGHT BREAST CENTRAL DUCT EXCISION (Right: Breast)  Patient Location: PACU  Anesthesia Type:General  Level of Consciousness: awake, drowsy, and confused  Airway & Oxygen Therapy: Patient Spontanous Breathing  Post-op Assessment: Report given to RN, Post -op Vital signs reviewed and stable, and Patient moving all extremities  Post vital signs: stable  Last Vitals:  Vitals Value Taken Time  BP 154/90 08/08/22 1527  Temp    Pulse 77 08/08/22 1530  Resp 17 08/08/22 1530  SpO2 98 % 08/08/22 1530  Vitals shown include unfiled device data.  Last Pain:  Vitals:   08/08/22 1213  TempSrc: Oral  PainSc: 4       Patients Stated Pain Goal: 0 (08/08/22 1213)  Complications: No notable events documented.

## 2022-08-09 ENCOUNTER — Encounter (HOSPITAL_COMMUNITY): Payer: Self-pay | Admitting: General Surgery

## 2022-08-10 LAB — SURGICAL PATHOLOGY

## 2022-08-26 ENCOUNTER — Other Ambulatory Visit: Payer: Self-pay | Admitting: Internal Medicine

## 2022-09-04 DIAGNOSIS — H40023 Open angle with borderline findings, high risk, bilateral: Secondary | ICD-10-CM | POA: Diagnosis not present

## 2022-10-04 ENCOUNTER — Ambulatory Visit: Payer: Medicare Other | Attending: Internal Medicine | Admitting: Internal Medicine

## 2022-10-04 VITALS — BP 138/76 | HR 66 | Ht 69.0 in | Wt 255.8 lb

## 2022-10-04 DIAGNOSIS — I5032 Chronic diastolic (congestive) heart failure: Secondary | ICD-10-CM | POA: Insufficient documentation

## 2022-10-04 DIAGNOSIS — I480 Paroxysmal atrial fibrillation: Secondary | ICD-10-CM | POA: Insufficient documentation

## 2022-10-04 DIAGNOSIS — I471 Supraventricular tachycardia, unspecified: Secondary | ICD-10-CM | POA: Insufficient documentation

## 2022-10-04 MED ORDER — TORSEMIDE 20 MG PO TABS: 20.0000 mg | ORAL_TABLET | Freq: Every day | ORAL | 1 refills | Status: DC

## 2022-10-04 NOTE — Patient Instructions (Signed)
Medication Instructions:  Your physician recommends that you continue on your current medications as directed. Please refer to the Current Medication list given to you today.  *If you need a refill on your cardiac medications before your next appointment, please call your pharmacy*   Lab Work: None ordered.  If you have labs (blood work) drawn today and your tests are completely normal, you will receive your results only by: MyChart Message (if you have MyChart) OR A paper copy in the mail If you have any lab test that is abnormal or we need to change your treatment, we will call you to review the results.   Testing/Procedures: None ordered.    Follow-Up: At [facility], you and your health needs are our priority.  As part of our continuing mission to provide you with exceptional heart care, we have created designated Provider Care Teams.  These Care Teams include your primary Cardiologist (physician) and Advanced Practice Providers (APPs -  Physician Assistants and Nurse Practitioners) who all work together to provide you with the care you need, when you need it.  We recommend signing up for the patient portal called "MyChart".  Sign up information is provided on this After Visit Summary.  MyChart is used to connect with patients for Virtual Visits (Telemedicine).  Patients are able to view lab/test results, encounter notes, upcoming appointments, etc.  Non-urgent messages can be sent to your provider as well.   To learn more about what you can do with MyChart, go to [email or web address].    Your next appointment:   6 months with Otilio Saber, PA

## 2022-10-04 NOTE — Progress Notes (Signed)
Patient Care Team: Chilton Greathouse, MD as PCP - General (Internal Medicine) Kathleene Hazel, MD as PCP - Cardiology (Cardiology) Duke Salvia, MD as PCP - Electrophysiology (Cardiology)   HPI  Bridget Cortez is a 77 y.o. female Seen in followup for [facility] which had ECG features of VT (concordance//aVR) but initiation and termination sequences suggestive of SVT--GT agreed more likely SVT; subsequently, 5/22, identified with atrial flutter >> atrial fib and started on anticoagulation. DCCV 7/22  Rx with amlodipine>> metoprolol      4th COVID Booster taken-Covid + April 2022. Post symptoms are shortness of breath.     Interval procedure for bloody nipple drainage which was recurrent.  Findings were benign   Swollen lower extremities.  Frustrated that she has to urinate all night because of her torsemide which she is taking at 6 PM.      DATE TEST EF  8/16 Echo   55-65 %  7/19 Echo   55-60 %   Date Cr K Hgb  2/20 0.86 4.1 12.3  6/20  0.62 3.8   8/21 0.61 4.0 12.8    3/22 0.54 3.7 12.6  8/22 0.79 3.6 11.4  10/23   11.6  3/24 0.94 4.0 10.3      Past Medical History:  Diagnosis Date   Abnormal Pap smear 2006   vaginal medicine according to patient   Allergy    Arthritis    Asthma    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 eye    cataract removed.   Chiari malformation    Diverticulosis    Dysrhythmia    SVT   GERD (gastroesophageal reflux disease)    H/O osteopenia    08/2006   H/O varicella    Heart murmur    Hypertension    Monilial vaginitis 07/2007   Multiple allergies    Ovarian cyst    Personal history of colonic adenomas 03/13/2012   Personal history of radiation therapy 1990   Pneumonia 07/2014   VIN III (vulvar intraepithelial neoplasia III) 03/2009   Yeast infection     Past Surgical History:  Procedure Laterality Date    ABDOMINAL HYSTERECTOMY     ABDOMINAL SURGERY     BREAST DUCTAL SYSTEM EXCISION Right 08/08/2022   Procedure: RIGHT BREAST CENTRAL DUCT EXCISION;  Surgeon: Almond Lint, MD;  Location: [facility] OR;  Service: General;  Laterality: Right;   BREAST EXCISIONAL BIOPSY Right    benign   BREAST LUMPECTOMY Left 1990   BREAST SURGERY     Lumpectomy, left breast   CARDIOVERSION N/A 07/28/2020   Procedure: CARDIOVERSION;  Surgeon: Little Ishikawa, MD;  Location: [facility] ENDOSCOPY;  Service: Cardiovascular;  Laterality: N/A;   CATARACT EXTRACTION Left 2018   COLONOSCOPY     COLONOSCOPY W/ BIOPSIES     EYE SURGERY     HERNIA REPAIR     KNEE SURGERY     right   lt. eye aneurysm surgery Left 2017   MOUTH SURGERY  2019   rt. side gum surgery  Lt. done 3 years ago   RADIOACTIVE SEED GUIDED EXCISIONAL BREAST BIOPSY Right 07/09/2018   Procedure: RADIOACTIVE SEED GUIDED EXCISIONAL RIGHT  BREAST BIOPSY;  Surgeon: Almond Lint, MD;  Location: [facility] OR;  Service: General;  Laterality: Right;   TOTAL KNEE ARTHROPLASTY Right 09/09/2019   Procedure: RIGHT  TOTAL KNEE ARTHROPLASTY;  Surgeon: Marcene Corning, MD;  Location: [facility] ORS;  Service: Orthopedics;  Laterality: Right;    Current Meds  Medication Sig   acetaminophen (TYLENOL) 500 MG tablet Take 1,000 mg by mouth every 6 (six) hours as needed (pain.).   albuterol (PROVENTIL) (5 MG/ML) 0.5% nebulizer solution Take 0.5 mLs (2.5 mg total) by nebulization every 6 (six) hours as needed for wheezing or shortness of breath.   amLODipine (NORVASC) 10 MG tablet Take 5 mg by mouth every evening.   betamethasone dipropionate 0.05 % cream Apply 1 Application topically 2 (two) times daily as needed (itching (dermatitis)).   Calcium Carbonate-Vitamin D (CALTRATE 600+D PO) Take 1 tablet by mouth in the morning.   cetirizine (ZYRTEC) 10 MG tablet Take 10 mg by mouth in the morning.   Cyanocobalamin (VITAMIN B-12 PO) Take 1 tablet by mouth daily with lunch.   esomeprazole (NEXIUM)  40 MG capsule Take 40 mg by mouth 2 (two) times daily before a meal. Before breakfast & before supper   Ferrous Sulfate (IRON PO) Take 1 tablet by mouth daily.   fluticasone (FLONASE) 50 MCG/ACT nasal spray Place 1 spray into both nostrils in the morning and at bedtime.   Fluticasone-Salmeterol (ADVAIR) 250-50 MCG/DOSE AEPB Inhale 1 puff into the lungs in the morning and at bedtime.   Multiple Vitamin (MULTIVITAMIN) tablet Take 1 tablet by mouth in the morning.   nebivolol (BYSTOLIC) 5 MG tablet Take 1 tablet by mouth once daily   oxyCODONE (OXY IR/ROXICODONE) 5 MG immediate release tablet Take 0.5-1 tablets (2.5-5 mg total) by mouth every 6 (six) hours as needed for severe pain.   potassium chloride SA (KLOR-CON) 20 MEQ tablet Take 20 mEq by mouth 3 (three) times daily.   rivaroxaban (XARELTO) 20 MG TABS tablet TAKE 1 TABLET BY MOUTH ONCE DAILY WITH SUPPER   rosuvastatin (CRESTOR) 5 MG tablet Take 5 mg by mouth every evening.   simethicone (MYLICON) 125 MG chewable tablet Chew 125 mg by mouth every 6 (six) hours as needed for flatulence.   SSD 1 % cream Apply 1 Application topically 2 (two) times daily as needed (chafing).   telmisartan (MICARDIS) 40 MG tablet Take 40 mg by mouth in the morning.   torsemide (DEMADEX) 20 MG tablet Take 1 tablet (20 mg total) by mouth daily. (Patient taking differently: Take 20 mg by mouth daily at 6 PM.)   triamcinolone cream (KENALOG) 0.1 % Apply 1 application  topically 2 (two) times daily as needed (itching.).   vitamin C (ASCORBIC ACID) 500 MG tablet Take 500 mg by mouth in the morning.    Allergies  Allergen Reactions   Ephedrine Shortness Of Breath   Cortisone Other (See Comments)    Severe muscle spasms    Review of Systems negative except from HPI and PMH BP 138/76   Pulse 66   Ht 5\' 9"  (1.753 m)   Wt 255 lb 12.8 oz (116 kg)   BMI 37.78 kg/m   Well developed and nourished in no acute distress HENT normal Neck supple  Clear Regular rate and  rhythm, no murmurs or gallops Abd-soft with active BS No Clubbing cyanosis 2+ edema Skin-warm and dry A & Oriented  Grossly normal sensory and motor function  ECG sinus at 72 Intervals 19/10/41  Assessment and  Plan  WCT probably SVT  Atrial flutter-Atrial fib Dx 5/22   HTN  HFpEF chronic  Anxiety  Cold intolerance improved    Blood pressure is  reasonably controlled.  Will continue her on amlodipine 5 Bystolic 5 and Micardis 40.  Volume overloaded.  She is replete and fluid.  Discussed the importance of salt restriction and fluid restriction.  Continue torsemide.  No bleeding on the Xarelto.  Will continue 20 mg.  Renal function 3/24 was GFR greater than 50  She would also be a candidate for an SGLT2.  I worry a little bit about her body habitus.  Will reach out to Dr. Vassie Loll

## 2022-10-11 DIAGNOSIS — Z23 Encounter for immunization: Secondary | ICD-10-CM | POA: Diagnosis not present

## 2022-10-19 DIAGNOSIS — G935 Compression of brain: Secondary | ICD-10-CM | POA: Diagnosis not present

## 2022-10-19 DIAGNOSIS — Z6838 Body mass index (BMI) 38.0-38.9, adult: Secondary | ICD-10-CM | POA: Diagnosis not present

## 2022-10-25 ENCOUNTER — Telehealth: Payer: Self-pay | Admitting: Physician Assistant

## 2022-10-25 NOTE — Telephone Encounter (Signed)
Called patient regarding October appointments, left a voicemail.

## 2022-10-27 NOTE — Progress Notes (Unsigned)
[facility] OFFICE PROGRESS NOTE  [place], [place] [address] [place] [address]  DIAGNOSIS: Leukocytopenia and mild anemia.  Dr. Arbutus Ped initially felt her leukocytopenia was drug-induced from her current treatment rosuvastatin; however, she did not have any improvement with discontinuation of the drug.  She also has mild anemia of chronic disease. She had bone marrow biopsy and aspirate that showed 4% plasma cells.   PRIOR THERAPY: None  CURRENT THERAPY:  1) the counter iron supplement 2) Over the counter B12  INTERVAL HISTORY: Bridget Cortez 77 y.o. female returns to clinic today for follow-up visit accompanied by her husband.  She was last seen by Dr. Arbutus Ped on 4//24.  Per chart review it looks like she is followed for history of leukocytopenia and mild anemia.  Dr. Arbutus Ped feels that this is secondary to her treatment with Crestor for which she is on ***.  Had a bone marrow biopsy and aspirate that did not show any concerning bone marrow abnormalities except for slight increase of plasma cells representing 4% total.  He is currently on observation with repeat blood work.  Dr. Arbutus Ped had recommended that she start taking over-the-counter iron supplement with vitamin C as well as B12 which she is ***  with.   As last being seen, she denies any recent or frequent infections.  Her energy is ***.  Of breath?  Denies any abnormal bleeding or bruising.  Colonoscopy?  She is here today for evaluation repeat blood work.   MEDICAL HISTORY: Past Medical History:  Diagnosis Date   Abnormal Pap smear 2006   vaginal medicine according to patient   Allergy    Arthritis    Asthma    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 eye    cataract removed.   Chiari malformation    Diverticulosis    Dysrhythmia    SVT   GERD (gastroesophageal reflux disease)    H/O  osteopenia    08/2006   H/O varicella    Heart murmur    Hypertension    Monilial vaginitis 07/2007   Multiple allergies    Ovarian cyst    Personal history of colonic adenomas 03/13/2012   Personal history of radiation therapy 1990   Pneumonia 07/2014   VIN III (vulvar intraepithelial neoplasia III) 03/2009   Yeast infection     ALLERGIES:  is allergic to ephedrine and cortisone.  MEDICATIONS:  Current Outpatient Medications  Medication Sig Dispense Refill   acetaminophen (TYLENOL) 500 MG tablet Take 1,000 mg by mouth every 6 (six) hours as needed (pain.).     albuterol (PROVENTIL) (5 MG/ML) 0.5% nebulizer solution Take 0.5 mLs (2.5 mg total) by nebulization every 6 (six) hours as needed for wheezing or shortness of breath. 20 mL 12   amLODipine (NORVASC) 10 MG tablet Take 5 mg by mouth every evening.     betamethasone dipropionate 0.05 % cream Apply 1 Application topically 2 (two) times daily as needed (itching (dermatitis)).     Calcium Carbonate-Vitamin D (CALTRATE 600+D PO) Take 1 tablet by mouth in the morning.     cetirizine (ZYRTEC) 10 MG tablet Take 10 mg by mouth in the morning.     Cyanocobalamin (VITAMIN B-12 PO) Take 1 tablet by mouth daily with lunch.     esomeprazole (NEXIUM) 40 MG capsule Take 40 mg by mouth 2 (two)  times daily before a meal. Before breakfast & before supper     Ferrous Sulfate (IRON PO) Take 1 tablet by mouth daily.     fluticasone (FLONASE) 50 MCG/ACT nasal spray Place 1 spray into both nostrils in the morning and at bedtime.     Fluticasone-Salmeterol (ADVAIR) 250-50 MCG/DOSE AEPB Inhale 1 puff into the lungs in the morning and at bedtime.     Multiple Vitamin (MULTIVITAMIN) tablet Take 1 tablet by mouth in the morning.     nebivolol (BYSTOLIC) 5 MG tablet Take 1 tablet by mouth once daily 90 tablet 3   oxyCODONE (OXY IR/ROXICODONE) 5 MG immediate release tablet Take 0.5-1 tablets (2.5-5 mg total) by mouth every 6 (six) hours as needed for severe pain.  15 tablet 0   potassium chloride SA (KLOR-CON) 20 MEQ tablet Take 20 mEq by mouth 3 (three) times daily.     rivaroxaban (XARELTO) 20 MG TABS tablet TAKE 1 TABLET BY MOUTH ONCE DAILY WITH SUPPER 90 tablet 1   rosuvastatin (CRESTOR) 5 MG tablet Take 5 mg by mouth every evening.     simethicone (MYLICON) 125 MG chewable tablet Chew 125 mg by mouth every 6 (six) hours as needed for flatulence.     SSD 1 % cream Apply 1 Application topically 2 (two) times daily as needed (chafing).     telmisartan (MICARDIS) 40 MG tablet Take 40 mg by mouth in the morning.     torsemide (DEMADEX) 20 MG tablet Take 1 tablet (20 mg total) by mouth daily. 90 tablet 1   triamcinolone cream (KENALOG) 0.1 % Apply 1 application  topically 2 (two) times daily as needed (itching.).     vitamin C (ASCORBIC ACID) 500 MG tablet Take 500 mg by mouth in the morning.     No current facility-administered medications for this visit.    SURGICAL HISTORY:  Past Surgical History:  Procedure Laterality Date   ABDOMINAL HYSTERECTOMY     ABDOMINAL SURGERY     BREAST DUCTAL SYSTEM EXCISION Right 08/08/2022   Procedure: RIGHT BREAST CENTRAL DUCT EXCISION;  Surgeon: Almond Lint, MD;  Location: [facility] OR;  Service: General;  Laterality: Right;   BREAST EXCISIONAL BIOPSY Right    benign   BREAST LUMPECTOMY Left 1990   BREAST SURGERY     Lumpectomy, left breast   CARDIOVERSION N/A 07/28/2020   Procedure: CARDIOVERSION;  Surgeon: Little Ishikawa, MD;  Location: [facility] ENDOSCOPY;  Service: Cardiovascular;  Laterality: N/A;   CATARACT EXTRACTION Left 2018   COLONOSCOPY     COLONOSCOPY W/ BIOPSIES     EYE SURGERY     HERNIA REPAIR     KNEE SURGERY     right   lt. eye aneurysm surgery Left 2017   MOUTH SURGERY  2019   rt. side gum surgery  Lt. done 3 years ago   RADIOACTIVE SEED GUIDED EXCISIONAL BREAST BIOPSY Right 07/09/2018   Procedure: RADIOACTIVE SEED GUIDED EXCISIONAL RIGHT  BREAST BIOPSY;  Surgeon: Almond Lint, MD;   Location: [facility] OR;  Service: General;  Laterality: Right;   TOTAL KNEE ARTHROPLASTY Right 09/09/2019   Procedure: RIGHT TOTAL KNEE ARTHROPLASTY;  Surgeon: Marcene Corning, MD;  Location: [facility] ORS;  Service: Orthopedics;  Laterality: Right;    REVIEW OF SYSTEMS:   Review of Systems  Constitutional: Negative for appetite change, chills, fatigue, fever and unexpected weight change.  HENT:   Negative for mouth sores, nosebleeds, sore throat and trouble swallowing.   Eyes: Negative for eye problems  and icterus.  Respiratory: Negative for cough, hemoptysis, shortness of breath and wheezing.   Cardiovascular: Negative for chest pain and leg swelling.  Gastrointestinal: Negative for abdominal pain, constipation, diarrhea, nausea and vomiting.  Genitourinary: Negative for bladder incontinence, difficulty urinating, dysuria, frequency and hematuria.   Musculoskeletal: Negative for back pain, gait problem, neck pain and neck stiffness.  Skin: Negative for itching and rash.  Neurological: Negative for dizziness, extremity weakness, gait problem, headaches, light-headedness and seizures.  Hematological: Negative for adenopathy. Does not bruise/bleed easily.  Psychiatric/Behavioral: Negative for confusion, depression and sleep disturbance. The patient is not nervous/anxious.     PHYSICAL EXAMINATION:  There were no vitals taken for this visit.  ECOG PERFORMANCE STATUS: {CHL ONC ECOG [id number]  Physical Exam  Constitutional: Oriented to person, place, and time and well-developed, well-nourished, and in no distress. No distress.  HENT:  Head: Normocephalic and atraumatic.  Mouth/Throat: Oropharynx is clear and moist. No oropharyngeal exudate.  Eyes: Conjunctivae are normal. Right eye exhibits no discharge. Left eye exhibits no discharge. No scleral icterus.  Neck: Normal range of motion. Neck supple.  Cardiovascular: Normal rate, regular rhythm, normal heart sounds and intact distal pulses.    Pulmonary/Chest: Effort normal and breath sounds normal. No respiratory distress. No wheezes. No rales.  Abdominal: Soft. Bowel sounds are normal. Exhibits no distension and no mass. There is no tenderness.  Musculoskeletal: Normal range of motion. Exhibits no edema.  Lymphadenopathy:    No cervical adenopathy.  Neurological: Alert and oriented to person, place, and time. Exhibits normal muscle tone. Gait normal. Coordination normal.  Skin: Skin is warm and dry. No rash noted. Not diaphoretic. No erythema. No pallor.  Psychiatric: Mood, memory and judgment normal.  Vitals reviewed.  LABORATORY DATA: Lab Results  Component Value Date   WBC 3.7 (L) 04/27/2022   HGB 10.3 (L) 04/27/2022   HCT 32.2 (L) 04/27/2022   MCV 84.5 04/27/2022   PLT 226 04/27/2022      Chemistry      Component Value Date/Time   NA 140 03/30/2022 1145   K 4.0 03/30/2022 1145   CL 103 03/30/2022 1145   CO2 22 03/30/2022 1145   BUN 15 03/30/2022 1145   CREATININE 0.94 03/30/2022 1145   CREATININE 0.79 09/15/2020 1121      Component Value Date/Time   CALCIUM 9.0 03/30/2022 1145   ALKPHOS 87 09/15/2020 1121   AST 18 09/15/2020 1121   ALT 14 09/15/2020 1121   BILITOT 0.5 09/15/2020 1121       RADIOGRAPHIC STUDIES:  No results found.   ASSESSMENT/PLAN:  Is a very pleasant 77 year old African-American female with persistent leukocytopenia and anemia of unclear etiology Dr. Arbutus Ped initially thought was secondary to drug induced but there is no improvement in her condition after discontinuing Crestor.  She previously underwent a bone marrow biopsy and aspirate which showed no concerning bone marrow abnormalities except for slight increase in plasma cells but only representing 4%.  She is currently on observation.  She is taking B12 supplement and iron supplement.  She had repeat labs today that showed ***  Dr. Arbutus Ped recommends that she continue on observation with a repeat labs in 6 months.  The  patient was advised to call immediately if she has any concerning symptoms in the interval. The patient voices understanding of current disease status and treatment options and is in agreement with the current care plan. All questions were answered. The patient knows to call the clinic with any problems, questions  or concerns. We can certainly see the patient much sooner if necessary   No orders of the defined types were placed in this encounter.    I spent {CHL ONC TIME VISIT - ZOXWR:[id number]} counseling the patient face to face. The total time spent in the appointment was {CHL ONC TIME VISIT - BJYNW:[id number]}.  Jordana Dugue L Martrell Eguia, [name] 10/27/22

## 2022-10-28 DIAGNOSIS — Z23 Encounter for immunization: Secondary | ICD-10-CM | POA: Diagnosis not present

## 2022-10-30 ENCOUNTER — Ambulatory Visit: Payer: Medicare Other | Admitting: Internal Medicine

## 2022-10-30 ENCOUNTER — Other Ambulatory Visit: Payer: Medicare Other

## 2022-11-01 ENCOUNTER — Inpatient Hospital Stay (HOSPITAL_BASED_OUTPATIENT_CLINIC_OR_DEPARTMENT_OTHER): Payer: Medicare Other | Admitting: Physician Assistant

## 2022-11-01 ENCOUNTER — Inpatient Hospital Stay: Payer: Medicare Other | Attending: Physician Assistant

## 2022-11-01 VITALS — BP 151/84 | HR 66 | Temp 98.4°F | Resp 13 | Wt 259.0 lb

## 2022-11-01 DIAGNOSIS — D649 Anemia, unspecified: Secondary | ICD-10-CM | POA: Insufficient documentation

## 2022-11-01 DIAGNOSIS — Z79899 Other long term (current) drug therapy: Secondary | ICD-10-CM | POA: Diagnosis not present

## 2022-11-01 DIAGNOSIS — Z7901 Long term (current) use of anticoagulants: Secondary | ICD-10-CM | POA: Diagnosis not present

## 2022-11-01 DIAGNOSIS — D539 Nutritional anemia, unspecified: Secondary | ICD-10-CM | POA: Diagnosis not present

## 2022-11-01 DIAGNOSIS — R635 Abnormal weight gain: Secondary | ICD-10-CM | POA: Diagnosis not present

## 2022-11-01 DIAGNOSIS — K219 Gastro-esophageal reflux disease without esophagitis: Secondary | ICD-10-CM | POA: Insufficient documentation

## 2022-11-01 DIAGNOSIS — D72819 Decreased white blood cell count, unspecified: Secondary | ICD-10-CM | POA: Insufficient documentation

## 2022-11-01 DIAGNOSIS — D638 Anemia in other chronic diseases classified elsewhere: Secondary | ICD-10-CM | POA: Insufficient documentation

## 2022-11-01 DIAGNOSIS — R252 Cramp and spasm: Secondary | ICD-10-CM | POA: Insufficient documentation

## 2022-11-01 DIAGNOSIS — I10 Essential (primary) hypertension: Secondary | ICD-10-CM | POA: Insufficient documentation

## 2022-11-01 DIAGNOSIS — Z7951 Long term (current) use of inhaled steroids: Secondary | ICD-10-CM | POA: Diagnosis not present

## 2022-11-01 DIAGNOSIS — J45909 Unspecified asthma, uncomplicated: Secondary | ICD-10-CM | POA: Diagnosis not present

## 2022-11-01 DIAGNOSIS — R011 Cardiac murmur, unspecified: Secondary | ICD-10-CM | POA: Diagnosis not present

## 2022-11-01 DIAGNOSIS — M25569 Pain in unspecified knee: Secondary | ICD-10-CM | POA: Diagnosis not present

## 2022-11-01 LAB — IRON AND IRON BINDING CAPACITY (CC-WL,HP ONLY)
Iron: 109 ug/dL (ref 28–170)
Saturation Ratios: 35 % — ABNORMAL HIGH (ref 10.4–31.8)
TIBC: 308 ug/dL (ref 250–450)
UIBC: 199 ug/dL (ref 148–442)

## 2022-11-01 LAB — CBC WITH DIFFERENTIAL (CANCER CENTER ONLY)
Abs Immature Granulocytes: 0.01 10*3/uL (ref 0.00–0.07)
Basophils Absolute: 0 10*3/uL (ref 0.0–0.1)
Basophils Relative: 1 %
Eosinophils Absolute: 0.2 10*3/uL (ref 0.0–0.5)
Eosinophils Relative: 6 %
HCT: 34.6 % — ABNORMAL LOW (ref 36.0–46.0)
Hemoglobin: 11.3 g/dL — ABNORMAL LOW (ref 12.0–15.0)
Immature Granulocytes: 0 %
Lymphocytes Relative: 38 %
Lymphs Abs: 1.2 10*3/uL (ref 0.7–4.0)
MCH: 28.8 pg (ref 26.0–34.0)
MCHC: 32.7 g/dL (ref 30.0–36.0)
MCV: 88.3 fL (ref 80.0–100.0)
Monocytes Absolute: 0.4 10*3/uL (ref 0.1–1.0)
Monocytes Relative: 12 %
Neutro Abs: 1.4 10*3/uL — ABNORMAL LOW (ref 1.7–7.7)
Neutrophils Relative %: 43 %
Platelet Count: 148 10*3/uL — ABNORMAL LOW (ref 150–400)
RBC: 3.92 MIL/uL (ref 3.87–5.11)
RDW: 15.9 % — ABNORMAL HIGH (ref 11.5–15.5)
WBC Count: 3.3 10*3/uL — ABNORMAL LOW (ref 4.0–10.5)
nRBC: 0 % (ref 0.0–0.2)

## 2022-11-01 LAB — FERRITIN: Ferritin: 39 ng/mL (ref 11–307)

## 2022-11-02 ENCOUNTER — Other Ambulatory Visit: Payer: Self-pay

## 2022-11-03 ENCOUNTER — Telehealth: Payer: Self-pay | Admitting: Physician Assistant

## 2022-11-03 NOTE — Telephone Encounter (Signed)
I called the patient to review the labs from this week.  Unable to reach the patient.  Left voicemail that Dr. Arbutus Ped did not have any concerns or further recommendations at this time and recommends continued observation with repeat labs in 6 months as scheduled.  If she has any questions she is free to call us back and I left our callback number.

## 2022-11-20 DIAGNOSIS — R0981 Nasal congestion: Secondary | ICD-10-CM | POA: Diagnosis not present

## 2022-11-20 DIAGNOSIS — R5383 Other fatigue: Secondary | ICD-10-CM | POA: Diagnosis not present

## 2022-11-20 DIAGNOSIS — J309 Allergic rhinitis, unspecified: Secondary | ICD-10-CM | POA: Diagnosis not present

## 2022-11-20 DIAGNOSIS — Z1152 Encounter for screening for COVID-19: Secondary | ICD-10-CM | POA: Diagnosis not present

## 2022-11-20 DIAGNOSIS — J4521 Mild intermittent asthma with (acute) exacerbation: Secondary | ICD-10-CM | POA: Diagnosis not present

## 2022-11-20 DIAGNOSIS — U071 COVID-19: Secondary | ICD-10-CM | POA: Diagnosis not present

## 2022-11-20 DIAGNOSIS — R059 Cough, unspecified: Secondary | ICD-10-CM | POA: Diagnosis not present

## 2022-11-26 ENCOUNTER — Ambulatory Visit: Payer: Medicare Other

## 2022-11-26 ENCOUNTER — Other Ambulatory Visit: Payer: Self-pay | Admitting: Internal Medicine

## 2022-11-26 ENCOUNTER — Ambulatory Visit
Admission: RE | Admit: 2022-11-26 | Discharge: 2022-11-26 | Disposition: A | Discharge status: Home / Self Care | Payer: Medicare Other | Source: Ambulatory Visit | Attending: Physician Assistant | Admitting: Physician Assistant

## 2022-11-26 VITALS — BP 145/80 | HR 60 | Temp 98.2°F | Resp 18

## 2022-11-26 DIAGNOSIS — I517 Cardiomegaly: Secondary | ICD-10-CM | POA: Diagnosis not present

## 2022-11-26 DIAGNOSIS — R918 Other nonspecific abnormal finding of lung field: Secondary | ICD-10-CM | POA: Diagnosis not present

## 2022-11-26 DIAGNOSIS — U071 COVID-19: Secondary | ICD-10-CM

## 2022-11-26 DIAGNOSIS — J168 Pneumonia due to other specified infectious organisms: Secondary | ICD-10-CM

## 2022-11-26 DIAGNOSIS — J189 Pneumonia, unspecified organism: Secondary | ICD-10-CM

## 2022-11-26 MED ORDER — AMOXICILLIN 500 MG PO CAPS: 1000.0000 mg | ORAL_CAPSULE | Freq: Two times a day (BID) | ORAL | 0 refills | Status: AC

## 2022-11-26 MED ORDER — AZITHROMYCIN 250 MG PO TABS: 250.0000 mg | ORAL_TABLET | Freq: Every day | ORAL | 0 refills | Status: DC

## 2022-11-26 NOTE — Discharge Instructions (Signed)
Continue your current medications See your doctor in 2 days for recheck Go to the emergency department for severe symptoms or concerns  The x-ray reading we discussed is preliminary. Your x-ray will be read by a radiologist in next few hours. If there is a discrepancy, you will be contacted, and instructed on a new plan for you care.

## 2022-11-26 NOTE — ED Provider Notes (Signed)
[facility]    CSN: [id number] Arrival date & time: 11/26/22  1129      History   Chief Complaint Chief Complaint  Patient presents with  . Cough    Asthma - Entered by patient    HPI Bridget Cortez is a 77 y.o. female.   The history is provided by the patient.  Cough Associated symptoms: fever, rhinorrhea, sore throat and wheezing   Associated symptoms: no chills, no ear pain and no shortness of breath   Sick for 8 days saw her primary care provider 7 days ago diagnosed with COVID.  Has a history of asthma has had difficulty sleeping secondary to coughing and wheezing.  She was taking Delsym then was prescribed hydrocodone/homatropine.  Continues to cough.  Past Medical History:  Diagnosis Date  . Abnormal Pap smear 2006   vaginal medicine according to patient  . Allergy   . Arthritis   . Asthma   . 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 Breast cancer (HCC) 1999   Left  . CAP (community acquired pneumonia) 08/06/2014  . Cataract    cataracts lt. eye    cataract removed.  . Chiari malformation   . Diverticulosis   . Dysrhythmia    SVT  . GERD (gastroesophageal reflux disease)   . H/O osteopenia    08/2006  . H/O varicella   . Heart murmur   . Hypertension   . Monilial vaginitis 07/2007  . Multiple allergies   . Ovarian cyst   . Personal history of colonic adenomas 03/13/2012  . Personal history of radiation therapy 1990  . Pneumonia 07/2014  . 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 Yeast infection     Patient Active Problem List   Diagnosis Date Noted  . PAF (paroxysmal atrial fibrillation) (HCC)/Flutter 10/04/2022  . Leukocytopenia 09/15/2020  . Deficiency anemia 09/15/2020  . Persistent atrial fibrillation (HCC)   . Primary osteoarthritis of right knee 09/09/2019  . (HFpEF) heart failure with preserved ejection fraction (HCC) 04/13/2019  . SVT (supraventricular tachycardia) (HCC) 08/04/2017  . Wide-complex tachycardia  08/03/2017  . Asthma 08/06/2014  . Anxiety about health 08/06/2014  . Essential hypertension 08/06/2014  . History of colonic polyps 03/20/2012  . Chiari malformation   . Heart murmur   . H/O varicella   . Yeast infection   . Cancer (HCC)   . H/O osteopenia   . Abnormal Pap smear   . Vulvar intraepithelial neoplasia III (VIN III) 04/12/2009  . Atrophy of vagina 06/23/2005    Past Surgical History:  Procedure Laterality Date  . ABDOMINAL HYSTERECTOMY    . ABDOMINAL SURGERY    . BREAST DUCTAL SYSTEM EXCISION Right 08/08/2022   Procedure: RIGHT BREAST CENTRAL DUCT EXCISION;  Surgeon: Almond Lint, MD;  Location: [facility] OR;  Service: General;  Laterality: Right;  . BREAST EXCISIONAL BIOPSY Right    benign  . BREAST LUMPECTOMY Left 1990  . BREAST SURGERY     Lumpectomy, left breast  . CARDIOVERSION N/A 07/28/2020   Procedure: CARDIOVERSION;  Surgeon: Little Ishikawa, MD;  Location: [facility] ENDOSCOPY;  Service: Cardiovascular;  Laterality: N/A;  . CATARACT EXTRACTION Left 2018  . COLONOSCOPY    . COLONOSCOPY W/ BIOPSIES    . EYE SURGERY    . HERNIA REPAIR    . KNEE SURGERY     right  . lt. eye aneurysm surgery Left 2017  . MOUTH SURGERY  2019   rt. side gum surgery  Lt. done 3 years ago  . RADIOACTIVE SEED GUIDED EXCISIONAL BREAST BIOPSY Right 07/09/2018   Procedure: RADIOACTIVE SEED GUIDED EXCISIONAL RIGHT  BREAST BIOPSY;  Surgeon: Almond Lint, MD;  Location: [facility] OR;  Service: General;  Laterality: Right;  . TOTAL KNEE ARTHROPLASTY Right 09/09/2019   Procedure: RIGHT TOTAL KNEE ARTHROPLASTY;  Surgeon: Marcene Corning, MD;  Location: [facility] ORS;  Service: Orthopedics;  Laterality: Right;    OB History     Gravida  0   Para  0   Term      Preterm  0   AB      Living  1      SAB      IAB      Ectopic      Multiple      Live Births           Obstetric Comments  PT HAS A ADOPTED CHILD GIRL          Home Medications    Prior to Admission medications    Medication Sig Start Date End Date Taking? Authorizing Provider  acetaminophen (TYLENOL) 500 MG tablet Take 1,000 mg by mouth every 6 (six) hours as needed (pain.).   Yes [provider]  albuterol (PROVENTIL) (5 MG/ML) 0.5% nebulizer solution Take 0.5 mLs (2.5 mg total) by nebulization every 6 (six) hours as needed for wheezing or shortness of breath. 02/23/18  Yes Curatolo, Adam, DO  amLODipine (NORVASC) 10 MG tablet Take 5 mg by mouth every evening.   Yes [provider]  betamethasone dipropionate 0.05 % cream Apply 1 Application topically 2 (two) times daily as needed (itching (dermatitis)).   Yes [provider]  Calcium Carbonate-Vitamin D (CALTRATE 600+D PO) Take 1 tablet by mouth in the morning.   Yes [provider]  cetirizine (ZYRTEC) 10 MG tablet Take 10 mg by mouth in the morning.   Yes [provider]  Cyanocobalamin (VITAMIN B-12 PO) Take 1 tablet by mouth daily with lunch.   Yes [provider]  esomeprazole (NEXIUM) 40 MG capsule Take 40 mg by mouth 2 (two) times daily before a meal. Before breakfast & before supper   Yes [provider]  Ferrous Sulfate (IRON PO) Take 1 tablet by mouth daily.   Yes [provider]  fluticasone (FLONASE) 50 MCG/ACT nasal spray Place 1 spray into both nostrils in the morning and at bedtime. 03/16/12  Yes [provider]  Fluticasone-Salmeterol (ADVAIR) 250-50 MCG/DOSE AEPB Inhale 1 puff into the lungs in the morning and at bedtime.   Yes [provider]  Multiple Vitamin (MULTIVITAMIN) tablet Take 1 tablet by mouth in the morning.   Yes [provider]  nebivolol (BYSTOLIC) 5 MG tablet Take 1 tablet by mouth once daily 08/28/22  Yes Duke Salvia, MD  oxyCODONE (OXY IR/ROXICODONE) 5 MG immediate release tablet Take 0.5-1 tablets (2.5-5 mg total) by mouth every 6 (six) hours as needed for severe pain. 08/08/22  Yes Almond Lint, MD  potassium chloride SA  (KLOR-CON) 20 MEQ tablet Take 20 mEq by mouth 3 (three) times daily.   Yes [provider]  rivaroxaban (XARELTO) 20 MG TABS tablet TAKE 1 TABLET BY MOUTH ONCE DAILY WITH SUPPER 06/09/22  Yes Kathleene Hazel, MD  rosuvastatin (CRESTOR) 5 MG tablet Take 5 mg by mouth every evening.   Yes [provider]  simethicone (MYLICON) 125 MG chewable tablet Chew 125 mg by mouth every 6 (six) hours as needed  for flatulence.   Yes [provider]  SSD 1 % cream Apply 1 Application topically 2 (two) times daily as needed (chafing). 07/25/22  Yes [provider]  telmisartan (MICARDIS) 40 MG tablet Take 40 mg by mouth in the morning.   Yes [provider]  torsemide (DEMADEX) 20 MG tablet Take 1 tablet (20 mg total) by mouth daily. 10/04/22  Yes Duke Salvia, MD  triamcinolone cream (KENALOG) 0.1 % Apply 1 application  topically 2 (two) times daily as needed (itching.). 07/05/20  Yes [provider]  vitamin C (ASCORBIC ACID) 500 MG tablet Take 500 mg by mouth in the morning.   Yes [provider]    Family History Family History  Problem Relation Age of Onset  . Cancer Mother   . Heart failure Other   . Colon cancer Neg Hx   . Esophageal cancer Neg Hx   . Rectal cancer Neg Hx   . Stomach cancer Neg Hx   . Colon polyps Neg Hx     Social History Social History   Tobacco Use  . Smoking status: Former    Current packs/day: 0.00    Types: Cigarettes    Quit date: 01/23/1978    Years since quitting: 44.8  . Smokeless tobacco: Never  Vaping Use  . Vaping status: Never Used  Substance Use Topics  . Alcohol use: No  . Drug use: No     Allergies   Ephedrine and Cortisone   Review of Systems Review of Systems  Constitutional:  Positive for fatigue and fever. Negative for chills.  HENT:  Positive for postnasal drip, rhinorrhea, sinus pain and sore throat. Negative for ear pain.   Respiratory:  Positive for cough and wheezing.  Negative for shortness of breath.   Neurological:  Negative for dizziness.     Physical Exam Triage Vital Signs ED Triage Vitals [11/26/22 1207]  Encounter Vitals Group     BP (!) 145/80     Systolic BP Percentile      Diastolic BP Percentile      Pulse Rate 60     Resp 18     Temp 98.2 F (36.8 C)     Temp src      SpO2 96 %     Weight      Height      Head Circumference      Peak Flow      Pain Score      Pain Loc      Pain Education      Exclude from Growth Chart    No data found.  Updated Vital Signs BP (!) 145/80 (BP Location: Right Arm)   Pulse 60   Temp 98.2 F (36.8 C)   Resp 18   SpO2 96%   Visual Acuity Right Eye Distance:   Left Eye Distance:   Bilateral Distance:    Right Eye Near:   Left Eye Near:    Bilateral Near:     Physical Exam Vitals and nursing note reviewed.  Constitutional:      Appearance: She is obese. She is not ill-appearing or toxic-appearing.  HENT:     Head: Normocephalic and atraumatic.     Right Ear: Tympanic membrane and ear canal normal.     Left Ear: Tympanic membrane and ear canal normal.  Cardiovascular:     Rate and Rhythm: Normal rate and regular rhythm.     Heart sounds: Normal heart sounds.  Pulmonary:  Effort: Pulmonary effort is normal.     Breath sounds: Normal breath sounds. No wheezing, rhonchi or rales.  Neurological:     Mental Status: She is alert and oriented to person, place, and time.     UC Treatments / Results  Labs (all labs ordered are listed, but only abnormal results are displayed) Labs Reviewed - No data to display  EKG   Radiology No results found.  Procedures Procedures (including critical care time)  Medications Ordered in UC Medications - No data to display  Initial Impression / Assessment and Plan / UC Course  I have reviewed the triage vital signs and the nursing notes.  Pertinent labs & imaging results that were available during my care of the patient were reviewed  by me and considered in my medical decision making (see chart for details).     77 year old female positive COVID test this week here for continued cough and wheezing which interferes with sleep. Signs are stable, chest x-ray independently viewed by me right lower lobe pneumonia. Final Clinical Impressions(s) / UC Diagnoses   Final diagnoses:  None   Discharge Instructions   None    ED Prescriptions   None    PDMP not reviewed this encounter.   Meliton Rattan, [name] 11/26/22 1441

## 2022-11-26 NOTE — ED Triage Notes (Signed)
Patient states that she has asthma. She's ben sick since last weekend. Went to PCP on Monday  told her she was covid positive. She took Hospital doctor. They also put her on hydrocodone bitart-homatrop that's not helping. She took a breathing treatment yesterday that didn't help. Post nasal drip that is making her cough. Cough is worse at night to where she has to use pillows to sit up.

## 2022-12-04 ENCOUNTER — Other Ambulatory Visit: Payer: Self-pay | Admitting: *Deleted

## 2022-12-04 DIAGNOSIS — J4521 Mild intermittent asthma with (acute) exacerbation: Secondary | ICD-10-CM | POA: Diagnosis not present

## 2022-12-04 DIAGNOSIS — J189 Pneumonia, unspecified organism: Secondary | ICD-10-CM | POA: Diagnosis not present

## 2022-12-04 DIAGNOSIS — I4892 Unspecified atrial flutter: Secondary | ICD-10-CM

## 2022-12-04 DIAGNOSIS — I4891 Unspecified atrial fibrillation: Secondary | ICD-10-CM | POA: Diagnosis not present

## 2022-12-04 MED ORDER — RIVAROXABAN 20 MG PO TABS
20.0000 mg | ORAL_TABLET | Freq: Every day | ORAL | 1 refills | Status: DC
Start: 2022-12-04 — End: 2023-06-04

## 2022-12-04 NOTE — Telephone Encounter (Signed)
Xarelto 20mg  refill request received. Pt is 77 years old, weight-117.5kg, Crea-0.94 on 03/30/22, last seen by Dr. Graciela Husbands on 10/04/22, Diagnosis-Afib, CrCl-92.97 mL/min; Dose is appropriate based on dosing criteria. Will send in refill to requested pharmacy.

## 2022-12-12 DIAGNOSIS — M25562 Pain in left knee: Secondary | ICD-10-CM | POA: Diagnosis not present

## 2022-12-19 ENCOUNTER — Telehealth: Payer: Self-pay

## 2022-12-19 NOTE — Telephone Encounter (Signed)
...     Pre-operative Risk Assessment    Patient Name: Bridget Cortez  DOB: 07/12/1945 MRN: [id number]     Request for Surgical Clearance    Procedure:   CROWN LENGTHENING TREATMENT  Date of Surgery:  Clearance TBD                                 Surgeon:  Loreli Slot Surgeon's Group or Practice Name:  [facility] Phone number:  [phone] Fax number:  Loreli Slot   Type of Clearance Requested:   - Medical  - Pharmacy:  Hold Rivaroxaban (Xarelto)     Type of Anesthesia:  Not Indicated   Additional requests/questions:   LAST O/V 10/04/22. NO NEW APPOINTMENT  Signed, Renee Ramus   12/19/2022, 1:25 PM

## 2022-12-20 NOTE — Telephone Encounter (Signed)
Patient with diagnosis of persistent A fib on Xarelto (rivaroxaban) 20 mg PO daily for anticoagulation.    Procedure: crown lengthening treatment Date of procedure: TBD   CHA2DS2-VASc Score = 5   This indicates a 7.2% annual risk of stroke. The patient's score is based upon: CHF History: 1 HTN History: 1 Diabetes History: 0 Stroke History: 0 Vascular Disease History: 0 Age Score: 2 Gender Score: 1      CrCl 109 mL/min (actual BW) Platelet count 148  Patient does not require pre-op antibiotics for dental procedure.  Per office protocol, patient can hold Xarelto (rivaroxaban) for 1 days prior to procedure.   Patient will not need bridging with Lovenox (enoxaparin) around procedure.  **This guidance is not considered finalized until pre-operative APP has relayed final recommendations.**  Nils Pyle, PharmD PGY1 Pharmacy Resident

## 2022-12-25 NOTE — Telephone Encounter (Signed)
   Patient Name: Bridget Cortez  DOB: 1945/03/08 MRN: [id number]  Primary Cardiologist: Verne Carrow, MD  Chart reviewed as part of pre-operative protocol coverage. Given past medical history and time since last visit, based on ACC/AHA guidelines, [name] is at acceptable risk for the planned procedure without further cardiovascular testing.   The patient was advised that if she develops new symptoms prior to surgery to contact our office to arrange for a follow-up visit, and she verbalized understanding.  Per office protocol, patient can hold Xarelto (rivaroxaban) for 1 days prior to procedure.   Patient will not need bridging with Lovenox (enoxaparin) around procedure  I will route this recommendation to the requesting party via Epic fax function and remove from pre-op pool.  Please call with questions.  Denyce Robert, NP 12/25/2022, 1:32 PM

## 2022-12-26 ENCOUNTER — Other Ambulatory Visit: Payer: Self-pay | Admitting: Internal Medicine

## 2022-12-26 DIAGNOSIS — Z1231 Encounter for screening mammogram for malignant neoplasm of breast: Secondary | ICD-10-CM

## 2023-01-29 DIAGNOSIS — J189 Pneumonia, unspecified organism: Secondary | ICD-10-CM | POA: Diagnosis not present

## 2023-01-29 DIAGNOSIS — M1990 Unspecified osteoarthritis, unspecified site: Secondary | ICD-10-CM | POA: Diagnosis not present

## 2023-01-29 DIAGNOSIS — I4891 Unspecified atrial fibrillation: Secondary | ICD-10-CM | POA: Diagnosis not present

## 2023-01-29 DIAGNOSIS — E038 Other specified hypothyroidism: Secondary | ICD-10-CM | POA: Diagnosis not present

## 2023-01-29 DIAGNOSIS — K219 Gastro-esophageal reflux disease without esophagitis: Secondary | ICD-10-CM | POA: Diagnosis not present

## 2023-01-29 DIAGNOSIS — J309 Allergic rhinitis, unspecified: Secondary | ICD-10-CM | POA: Diagnosis not present

## 2023-01-29 DIAGNOSIS — J4521 Mild intermittent asthma with (acute) exacerbation: Secondary | ICD-10-CM | POA: Diagnosis not present

## 2023-01-29 DIAGNOSIS — I119 Hypertensive heart disease without heart failure: Secondary | ICD-10-CM | POA: Diagnosis not present

## 2023-01-29 DIAGNOSIS — D72819 Decreased white blood cell count, unspecified: Secondary | ICD-10-CM | POA: Diagnosis not present

## 2023-01-29 DIAGNOSIS — F419 Anxiety disorder, unspecified: Secondary | ICD-10-CM | POA: Diagnosis not present

## 2023-01-29 DIAGNOSIS — E782 Mixed hyperlipidemia: Secondary | ICD-10-CM | POA: Diagnosis not present

## 2023-01-30 ENCOUNTER — Ambulatory Visit: Payer: Medicare Other

## 2023-01-31 ENCOUNTER — Ambulatory Visit
Admission: RE | Admit: 2023-01-31 | Discharge: 2023-01-31 | Disposition: A | Discharge status: Home / Self Care | Payer: Medicare Other | Source: Ambulatory Visit | Attending: Internal Medicine | Admitting: Internal Medicine

## 2023-01-31 DIAGNOSIS — Z1231 Encounter for screening mammogram for malignant neoplasm of breast: Secondary | ICD-10-CM

## 2023-02-01 DIAGNOSIS — M1712 Unilateral primary osteoarthritis, left knee: Secondary | ICD-10-CM | POA: Diagnosis not present

## 2023-02-02 ENCOUNTER — Telehealth: Payer: Self-pay | Admitting: Cardiovascular Disease

## 2023-02-02 NOTE — Telephone Encounter (Signed)
*  STAT* If patient is at the pharmacy, call can be transferred to refill team.   1. Which medications need to be refilled? (please list name of each medication and dose if known)   torsemide  (DEMADEX ) 20 MG tablet    2. Which pharmacy/location (including street and city if local pharmacy) is medication to be sent to?  [facility] [address] [place] - [address]    3. Do they need a 30 day or 90 day supply? 90

## 2023-02-05 MED ORDER — TORSEMIDE 20 MG PO TABS: 20.0000 mg | ORAL_TABLET | Freq: Every day | ORAL | 2 refills | Status: DC

## 2023-02-05 NOTE — Telephone Encounter (Signed)
Pt's medication has already been sent to pt's pharmacy as requested. Confirmation received.  

## 2023-02-27 DIAGNOSIS — L821 Other seborrheic keratosis: Secondary | ICD-10-CM | POA: Diagnosis not present

## 2023-02-27 DIAGNOSIS — L820 Inflamed seborrheic keratosis: Secondary | ICD-10-CM | POA: Diagnosis not present

## 2023-03-08 DIAGNOSIS — M1712 Unilateral primary osteoarthritis, left knee: Secondary | ICD-10-CM | POA: Diagnosis not present

## 2023-04-01 NOTE — Progress Notes (Unsigned)
  Electrophysiology Office Note:   Date:  04/01/2023  ID:  Cleora Fleet, DOB 1945/02/14, MRN [id number]  Primary Cardiologist: Verne Carrow, MD Electrophysiologist: Sherryl Manges, MD  {Click to update primary MD,subspecialty MD or APP then REFRESH:1}    History of Present Illness:   Bridget Cortez is a 78 y.o. female with h/o WCT (probably SVT, AFL, PAF, HFpEF, and HTN seen today for routine electrophysiology followup.   Since last being seen in our clinic the patient reports doing ***.  she denies chest pain, palpitations, dyspnea, PND, orthopnea, nausea, vomiting, dizziness, syncope, edema, weight gain, or early satiety.   Review of systems complete and found to be negative unless listed in HPI.   EP Information / Studies Reviewed:    EKG is ordered today. Personal review as below.       Arrhythmia/Device History No specialty comments available.   Physical Exam:   VS:  There were no vitals taken for this visit.   Wt Readings from Last 3 Encounters:  11/01/22 259 lb (117.5 kg)  10/04/22 255 lb 12.8 oz (116 kg)  08/08/22 259 lb (117.5 kg)     GEN: No acute distress NECK: No JVD; No carotid bruits CARDIAC: {EPRHYTHM:28826}, no murmurs, rubs, gallops RESPIRATORY:  Clear to auscultation without rales, wheezing or rhonchi  ABDOMEN: Soft, non-tender, non-distended EXTREMITIES:  {EDEMA LEVEL:28147::"No"} edema; No deformity   ASSESSMENT AND PLAN:    WCT -> likely SVT Paroxysmal atrial flutter Paroxysmal atrial fib EKG today shows *** *** symptoms Continue Xarelto for CHA2DS2/VASc of at least 5.    HTN Stable on current regimen    Chronic diastolic CHF Echo 07/2017 LVEF 08-65% Volume status *** Continue torsemide 20 mg daily. Can take additional as needed but limited by cramping Reinforced fluid restriction to < 2 L daily, sodium restriction to less than 2000 mg daily, and the importance of daily weights.   Labs today ***  {Click here to Review  PMH, Prob List, Meds, Allergies, SHx, FHx  :1}   Follow up with {HQION:62952} {EPFOLLOW WU:13244}  Signed, Graciella Freer, [name]

## 2023-04-02 ENCOUNTER — Ambulatory Visit: Payer: Medicare Other | Attending: Student | Admitting: Student

## 2023-04-02 ENCOUNTER — Encounter: Payer: Self-pay | Admitting: Student

## 2023-04-02 VITALS — BP 128/78 | HR 80 | Ht 69.0 in

## 2023-04-02 DIAGNOSIS — I4892 Unspecified atrial flutter: Secondary | ICD-10-CM | POA: Diagnosis present

## 2023-04-02 DIAGNOSIS — I4819 Other persistent atrial fibrillation: Secondary | ICD-10-CM | POA: Diagnosis present

## 2023-04-02 DIAGNOSIS — I5032 Chronic diastolic (congestive) heart failure: Secondary | ICD-10-CM | POA: Diagnosis present

## 2023-04-02 DIAGNOSIS — R000 Tachycardia, unspecified: Secondary | ICD-10-CM | POA: Diagnosis present

## 2023-04-02 DIAGNOSIS — I480 Paroxysmal atrial fibrillation: Secondary | ICD-10-CM | POA: Insufficient documentation

## 2023-04-02 DIAGNOSIS — I471 Supraventricular tachycardia, unspecified: Secondary | ICD-10-CM | POA: Insufficient documentation

## 2023-04-02 NOTE — Patient Instructions (Signed)
 Medication Instructions:  Your physician recommends that you continue on your current medications as directed. Please refer to the Current Medication list given to you today.  *If you need a refill on your cardiac medications before your next appointment, please call your pharmacy*  Lab Work: None ordered If you have labs (blood work) drawn today and your tests are completely normal, you will receive your results only by: MyChart Message (if you have MyChart) OR A paper copy in the mail If you have any lab test that is abnormal or we need to change your treatment, we will call you to review the results.  Follow-Up: At [facility], you and your health needs are our priority.  As part of our continuing mission to provide you with exceptional heart care, we have created designated Provider Care Teams.  These Care Teams include your primary Cardiologist (physician) and Advanced Practice Providers (APPs -  Physician Assistants and Nurse Practitioners) who all work together to provide you with the care you need, when you need it.  We recommend signing up for the patient portal called "MyChart".  Sign up information is provided on this After Visit Summary.  MyChart is used to connect with patients for Virtual Visits (Telemedicine).  Patients are able to view lab/test results, encounter notes, upcoming appointments, etc.  Non-urgent messages can be sent to your provider as well.   To learn more about what you can do with MyChart, go to [email or web address].    Your next appointment:   6 month(s)  Provider:   Sherryl Manges, MD

## 2023-04-06 ENCOUNTER — Ambulatory Visit
Admission: EM | Admit: 2023-04-06 | Discharge: 2023-04-06 | Disposition: A | Discharge status: Home / Self Care | Attending: Family Medicine | Admitting: Family Medicine

## 2023-04-06 DIAGNOSIS — R112 Nausea with vomiting, unspecified: Secondary | ICD-10-CM

## 2023-04-06 DIAGNOSIS — R197 Diarrhea, unspecified: Secondary | ICD-10-CM | POA: Diagnosis not present

## 2023-04-06 DIAGNOSIS — A084 Viral intestinal infection, unspecified: Secondary | ICD-10-CM | POA: Diagnosis not present

## 2023-04-06 MED ORDER — LOPERAMIDE HCL 2 MG PO CAPS: 2.0000 mg | ORAL_CAPSULE | Freq: Two times a day (BID) | ORAL | 0 refills | Status: AC | PRN

## 2023-04-06 MED ORDER — ONDANSETRON 8 MG PO TBDP: 8.0000 mg | ORAL_TABLET | Freq: Three times a day (TID) | ORAL | 0 refills | Status: DC | PRN

## 2023-04-06 MED ORDER — ONDANSETRON HCL 4 MG/2ML IJ SOLN
4.0000 mg | Freq: Once | INTRAMUSCULAR | Status: AC
  Administered 2023-04-06: 4 mg via INTRAMUSCULAR

## 2023-04-06 NOTE — ED Triage Notes (Signed)
 Pt states vomiting and diarrhea since this afternoon.

## 2023-04-06 NOTE — ED Provider Notes (Signed)
 [facility]  Note:  This document was prepared using Conservation officer, historic buildings and may include unintentional dictation errors.  MRN: [id number] DOB: 1945-12-14  Subjective:   Bridget Cortez is a 78 y.o. female presenting for acute onset of nausea, vomiting, diarrhea since this afternoon. Patient came in earlier today with her husband, had exact same GI symptoms.  No bloody stools.  Symptoms started after eating takeout.  Patient also ate a lot of takeout this afternoon.  No current facility-administered medications for this encounter.  Current Outpatient Medications:    acetaminophen (TYLENOL) 500 MG tablet, Take 1,000 mg by mouth every 6 (six) hours as needed (pain.)., Disp: , Rfl:    albuterol (PROVENTIL) (5 MG/ML) 0.5% nebulizer solution, Take 0.5 mLs (2.5 mg total) by nebulization every 6 (six) hours as needed for wheezing or shortness of breath., Disp: 20 mL, Rfl: 12   amLODipine (NORVASC) 10 MG tablet, Take 5 mg by mouth every evening., Disp: , Rfl:    betamethasone dipropionate 0.05 % cream, Apply 1 Application topically 2 (two) times daily as needed (itching (dermatitis))., Disp: , Rfl:    Calcium Carbonate-Vitamin D (CALTRATE 600+D PO), Take 1 tablet by mouth in the morning., Disp: , Rfl:    cetirizine (ZYRTEC) 10 MG tablet, Take 10 mg by mouth in the morning., Disp: , Rfl:    Cyanocobalamin (VITAMIN B-12 PO), Take 1 tablet by mouth daily with lunch., Disp: , Rfl:    esomeprazole (NEXIUM) 40 MG capsule, Take 40 mg by mouth 2 (two) times daily before a meal. Before breakfast & before supper, Disp: , Rfl:    Ferrous Sulfate (IRON PO), Take 1 tablet by mouth daily., Disp: , Rfl:    fluticasone (FLONASE) 50 MCG/ACT nasal spray, Place 1 spray into both nostrils in the morning and at bedtime., Disp: , Rfl:    Fluticasone-Salmeterol (ADVAIR) 250-50 MCG/DOSE AEPB, Inhale 1 puff into the lungs in the morning and at bedtime., Disp: , Rfl:    Multiple  Vitamin (MULTIVITAMIN) tablet, Take 1 tablet by mouth in the morning., Disp: , Rfl:    nebivolol (BYSTOLIC) 5 MG tablet, Take 1 tablet by mouth once daily, Disp: 90 tablet, Rfl: 2   oxyCODONE (OXY IR/ROXICODONE) 5 MG immediate release tablet, Take 0.5-1 tablets (2.5-5 mg total) by mouth every 6 (six) hours as needed for severe pain., Disp: 15 tablet, Rfl: 0   potassium chloride SA (KLOR-CON) 20 MEQ tablet, Take 20 mEq by mouth 3 (three) times daily., Disp: , Rfl:    rivaroxaban (XARELTO) 20 MG TABS tablet, Take 1 tablet (20 mg total) by mouth daily with supper., Disp: 90 tablet, Rfl: 1   rosuvastatin (CRESTOR) 5 MG tablet, Take 5 mg by mouth every evening., Disp: , Rfl:    simethicone (MYLICON) 125 MG chewable tablet, Chew 125 mg by mouth every 6 (six) hours as needed for flatulence., Disp: , Rfl:    SSD 1 % cream, Apply 1 Application topically 2 (two) times daily as needed (chafing)., Disp: , Rfl:    telmisartan (MICARDIS) 40 MG tablet, Take 40 mg by mouth in the morning., Disp: , Rfl:    torsemide (DEMADEX) 20 MG tablet, Take 1 tablet (20 mg total) by mouth daily., Disp: 90 tablet, Rfl: 2   triamcinolone cream (KENALOG) 0.1 %, Apply 1 application  topically 2 (two) times daily as needed (itching.)., Disp: , Rfl:    vitamin C (ASCORBIC ACID) 500 MG tablet, Take 500 mg by  mouth in the morning., Disp: , Rfl:    Allergies  Allergen Reactions   Ephedrine Shortness Of Breath   Cortisone Other (See Comments)    Severe muscle spasms    Past Medical History:  Diagnosis Date   Abnormal Pap smear 2006   vaginal medicine according to patient   Allergy    Arthritis    Asthma    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 eye    cataract removed.   Chiari malformation    Diverticulosis    Dysrhythmia    SVT   GERD (gastroesophageal reflux disease)    H/O osteopenia    08/2006   H/O varicella    Heart  murmur    Hypertension    Monilial vaginitis 07/2007   Multiple allergies    Ovarian cyst    Personal history of colonic adenomas 03/13/2012   Personal history of radiation therapy 1990   Pneumonia 07/2014   VIN III (vulvar intraepithelial neoplasia III) 03/2009   Yeast infection      Past Surgical History:  Procedure Laterality Date   ABDOMINAL HYSTERECTOMY     ABDOMINAL SURGERY     BREAST DUCTAL SYSTEM EXCISION Right 08/08/2022   Procedure: RIGHT BREAST CENTRAL DUCT EXCISION;  Surgeon: Almond Lint, MD;  Location: [facility] OR;  Service: General;  Laterality: Right;   BREAST EXCISIONAL BIOPSY Right    benign   BREAST LUMPECTOMY Left 1990   BREAST SURGERY     Lumpectomy, left breast   CARDIOVERSION N/A 07/28/2020   Procedure: CARDIOVERSION;  Surgeon: Little Ishikawa, MD;  Location: [facility] ENDOSCOPY;  Service: Cardiovascular;  Laterality: N/A;   CATARACT EXTRACTION Left 2018   COLONOSCOPY     COLONOSCOPY W/ BIOPSIES     EYE SURGERY     HERNIA REPAIR     KNEE SURGERY     right   lt. eye aneurysm surgery Left 2017   MOUTH SURGERY  2019   rt. side gum surgery  Lt. done 3 years ago   RADIOACTIVE SEED GUIDED EXCISIONAL BREAST BIOPSY Right 07/09/2018   Procedure: RADIOACTIVE SEED GUIDED EXCISIONAL RIGHT  BREAST BIOPSY;  Surgeon: Almond Lint, MD;  Location: [facility] OR;  Service: General;  Laterality: Right;   TOTAL KNEE ARTHROPLASTY Right 09/09/2019   Procedure: RIGHT TOTAL KNEE ARTHROPLASTY;  Surgeon: Marcene Corning, MD;  Location: [facility] ORS;  Service: Orthopedics;  Laterality: Right;    Family History  Problem Relation Age of Onset   Cancer Mother    Heart failure Other    Colon cancer Neg Hx    Esophageal cancer Neg Hx    Rectal cancer Neg Hx    Stomach cancer Neg Hx    Colon polyps Neg Hx     Social History   Tobacco Use   Smoking status: Former    Current packs/day: 0.00    Types: Cigarettes    Quit date: 01/23/1978    Years since quitting: 45.2   Smokeless tobacco: Never   Vaping Use   Vaping status: Never Used  Substance Use Topics   Alcohol use: No   Drug use: No    ROS   Objective:   Vitals: BP (!) 147/85 (BP Location: Right Arm)   Pulse 87   Temp 98.7 F (37.1 C) (Oral)   Resp 16   SpO2 93%   Physical Exam Constitutional:  General: She is not in acute distress.    Appearance: Normal appearance. She is well-developed. She is not ill-appearing, toxic-appearing or diaphoretic.  HENT:     Head: Normocephalic and atraumatic.     Nose: Nose normal.     Mouth/Throat:     Mouth: Mucous membranes are moist.  Eyes:     General: No scleral icterus.       Right eye: No discharge.        Left eye: No discharge.     Extraocular Movements: Extraocular movements intact.     Conjunctiva/sclera: Conjunctivae normal.  Cardiovascular:     Rate and Rhythm: Normal rate and regular rhythm.     Heart sounds: Normal heart sounds. No murmur heard.    No friction rub. No gallop.  Pulmonary:     Effort: Pulmonary effort is normal. No respiratory distress.     Breath sounds: No stridor. No wheezing, rhonchi or rales.  Chest:     Chest wall: No tenderness.  Abdominal:     General: Bowel sounds are increased. There is no distension.     Palpations: Abdomen is soft. There is no mass.     Tenderness: There is no abdominal tenderness. There is no right CVA tenderness, left CVA tenderness, guarding or rebound.     Comments: Patient actively vomited in clinic multiple times.  Skin:    General: Skin is warm and dry.  Neurological:     General: No focal deficit present.     Mental Status: She is alert and oriented to person, place, and time.  Psychiatric:        Mood and Affect: Mood normal.        Behavior: Behavior normal.        Thought Content: Thought content normal.        Judgment: Judgment normal.    IM Zofran 4 mg administered in clinic.  Assessment and Plan :   PDMP not reviewed this encounter.  1. Viral gastroenteritis   2. Nausea  vomiting and diarrhea    Will manage for suspected viral gastroenteritis with supportive care.  Recommended patient hydrate well, eat light meals and maintain electrolytes.  Will use Zofran and Imodium for nausea, vomiting and diarrhea. Counseled patient on potential for adverse effects with medications prescribed/recommended today, ER and return-to-clinic precautions discussed, patient verbalized understanding.    Wallis Bamberg, [place] 04/06/23 0300

## 2023-04-06 NOTE — Discharge Instructions (Signed)

## 2023-04-20 DIAGNOSIS — R609 Edema, unspecified: Secondary | ICD-10-CM | POA: Diagnosis not present

## 2023-04-20 DIAGNOSIS — N6452 Nipple discharge: Secondary | ICD-10-CM | POA: Diagnosis not present

## 2023-04-20 DIAGNOSIS — Z853 Personal history of malignant neoplasm of breast: Secondary | ICD-10-CM | POA: Diagnosis not present

## 2023-04-20 DIAGNOSIS — L821 Other seborrheic keratosis: Secondary | ICD-10-CM | POA: Diagnosis not present

## 2023-04-26 NOTE — Progress Notes (Signed)
 [facility] OFFICE PROGRESS NOTE  Chilton Greathouse, MD [address] [place] [address]  DIAGNOSIS: Leukocytopenia and mild anemia.  Dr. Arbutus Ped initially felt her leukocytopenia was drug-induced from her current treatment rosuvastatin; however, she did not have any improvement with discontinuation of the drug.  She also has mild anemia of chronic disease. She had bone marrow biopsy and aspirate that showed 4% plasma cells.   PRIOR THERAPY: None  CURRENT THERAPY:  1) The counter iron supplement 2) Over the counter B12  INTERVAL HISTORY: Bridget Cortez 78 y.o. female  returns to the clinic today for a follow-up visit accompanied by her husband. She was last seen by myself and Dr. Arbutus Ped on 11/01/22.  Per chart review it looks like she is followed for history of leukocytopenia and mild anemia.  She had a bone marrow biopsy and aspirate that did not show any concerning bone marrow abnormalities except for slight increase of plasma cells representing 4% total.  She is currently on observation with repeat blood work.  Dr. Arbutus Ped had recommended that she start taking over-the-counter iron supplement with vitamin C as well as B12 which she is compliant with daily.   He has been feeling well overall in the interval since last being seen.  She did have gastroenteritis a few weeks ago which she has recovered.  Otherwise she denies any recent signs and symptoms of infection.  She sees her cardiologist for lower extremity swelling.  She tells me that she has had some skin darkening/venous stasis dermatitis since she had her knee replacement 2 years ago and she is wondering why that is.  She is compliant with her compression stockings.  Her right leg is worse than the left due to a right knee replacement.  She tells me that she sees Ortho for cortisone injections in the left knee.  She denies any unusual fevers.  In general, she reports her energy is good.  She denies any recent  fractures.  She denies any abnormal bleeding or bruising.  Her last colonoscopy was in 2019 without any major concerns.  She is here today for evaluation and repeat blood work.   MEDICAL HISTORY: Past Medical History:  Diagnosis Date   Abnormal Pap smear 2006   vaginal medicine according to patient   Allergy    Arthritis    Asthma    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 eye    cataract removed.   Chiari malformation    Diverticulosis    Dysrhythmia    SVT   GERD (gastroesophageal reflux disease)    H/O osteopenia    08/2006   H/O varicella    Heart murmur    Hypertension    Monilial vaginitis 07/2007   Multiple allergies    Ovarian cyst    Personal history of colonic adenomas 03/13/2012   Personal history of radiation therapy 1990   Pneumonia 07/2014   VIN III (vulvar intraepithelial neoplasia III) 03/2009   Yeast infection     ALLERGIES:  is allergic to ephedrine and cortisone.  MEDICATIONS:  Current Outpatient Medications  Medication Sig Dispense Refill   acetaminophen (TYLENOL) 500 MG tablet Take 1,000 mg by mouth every 6 (six) hours as needed (pain.).     albuterol (PROVENTIL) (5 MG/ML) 0.5% nebulizer solution Take 0.5 mLs (2.5 mg total) by nebulization every 6 (six) hours as  needed for wheezing or shortness of breath. 20 mL 12   amLODipine (NORVASC) 10 MG tablet Take 5 mg by mouth every evening.     betamethasone dipropionate 0.05 % cream Apply 1 Application topically 2 (two) times daily as needed (itching (dermatitis)).     Calcium Carbonate-Vitamin D (CALTRATE 600+D PO) Take 1 tablet by mouth in the morning.     cetirizine (ZYRTEC) 10 MG tablet Take 10 mg by mouth in the morning.     Cyanocobalamin (VITAMIN B-12 PO) Take 1 tablet by mouth daily with lunch.     esomeprazole (NEXIUM) 40 MG capsule Take 40 mg by mouth 2 (two) times daily before a meal. Before breakfast &  before supper     Ferrous Sulfate (IRON PO) Take 1 tablet by mouth daily.     fluticasone (FLONASE) 50 MCG/ACT nasal spray Place 1 spray into both nostrils in the morning and at bedtime.     Fluticasone-Salmeterol (ADVAIR) 250-50 MCG/DOSE AEPB Inhale 1 puff into the lungs in the morning and at bedtime.     loperamide (IMODIUM) 2 MG capsule Take 1 capsule (2 mg total) by mouth 2 (two) times daily as needed for diarrhea or loose stools. 14 capsule 0   Multiple Vitamin (MULTIVITAMIN) tablet Take 1 tablet by mouth in the morning.     nebivolol (BYSTOLIC) 5 MG tablet Take 1 tablet by mouth once daily 90 tablet 2   ondansetron (ZOFRAN-ODT) 8 MG disintegrating tablet Take 1 tablet (8 mg total) by mouth every 8 (eight) hours as needed for nausea or vomiting. 20 tablet 0   oxyCODONE (OXY IR/ROXICODONE) 5 MG immediate release tablet Take 0.5-1 tablets (2.5-5 mg total) by mouth every 6 (six) hours as needed for severe pain. 15 tablet 0   potassium chloride SA (KLOR-CON) 20 MEQ tablet Take 20 mEq by mouth 3 (three) times daily.     rivaroxaban (XARELTO) 20 MG TABS tablet Take 1 tablet (20 mg total) by mouth daily with supper. 90 tablet 1   rosuvastatin (CRESTOR) 5 MG tablet Take 5 mg by mouth every evening.     simethicone (MYLICON) 125 MG chewable tablet Chew 125 mg by mouth every 6 (six) hours as needed for flatulence.     SSD 1 % cream Apply 1 Application topically 2 (two) times daily as needed (chafing).     telmisartan (MICARDIS) 40 MG tablet Take 40 mg by mouth in the morning.     torsemide (DEMADEX) 20 MG tablet Take 1 tablet (20 mg total) by mouth daily. 90 tablet 2   triamcinolone cream (KENALOG) 0.1 % Apply 1 application  topically 2 (two) times daily as needed (itching.).     vitamin C (ASCORBIC ACID) 500 MG tablet Take 500 mg by mouth in the morning.     No current facility-administered medications for this visit.    SURGICAL HISTORY:  Past Surgical History:  Procedure Laterality Date    ABDOMINAL HYSTERECTOMY     ABDOMINAL SURGERY     BREAST DUCTAL SYSTEM EXCISION Right 08/08/2022   Procedure: RIGHT BREAST CENTRAL DUCT EXCISION;  Surgeon: Almond Lint, MD;  Location: [facility] OR;  Service: General;  Laterality: Right;   BREAST EXCISIONAL BIOPSY Right    benign   BREAST LUMPECTOMY Left 1990   BREAST SURGERY     Lumpectomy, left breast   CARDIOVERSION N/A 07/28/2020   Procedure: CARDIOVERSION;  Surgeon: Little Ishikawa, MD;  Location[facility] ENDOSCOPY;  Service: Cardiovascular;  Laterality: N/A;   CATARACT  EXTRACTION Left 2018   COLONOSCOPY     COLONOSCOPY W/ BIOPSIES     EYE SURGERY     HERNIA REPAIR     KNEE SURGERY     right   lt. eye aneurysm surgery Left 2017   MOUTH SURGERY  2019   rt. side gum surgery  Lt. done 3 years ago   RADIOACTIVE SEED GUIDED EXCISIONAL BREAST BIOPSY Right 07/09/2018   Procedure: RADIOACTIVE SEED GUIDED EXCISIONAL RIGHT  BREAST BIOPSY;  Surgeon: Almond Lint, MD;  Location: [facility] OR;  Service: General;  Laterality: Right;   TOTAL KNEE ARTHROPLASTY Right 09/09/2019   Procedure: RIGHT TOTAL KNEE ARTHROPLASTY;  Surgeon: Marcene Corning, MD;  Location: [facility] ORS;  Service: Orthopedics;  Laterality: Right;    REVIEW OF SYSTEMS:   Review of Systems  Constitutional: Positive for stable fatigue (age related per patient report). Negative for appetite change, chills, fever and unexpected weight change.  HENT: Negative for mouth sores, nosebleeds, sore throat and trouble swallowing.   Eyes: Negative for eye problems and icterus.  Respiratory: Negative for cough, hemoptysis, shortness of breath and wheezing.   Cardiovascular: Negative for chest pain. Positive for persistent leg swelling (R>L) Gastrointestinal: Negative for abdominal pain, constipation, diarrhea, nausea and vomiting.  Genitourinary: Negative for bladder incontinence, difficulty urinating, dysuria, frequency and hematuria.   Musculoskeletal: Positive for knee pain (Bilateral L>R) Negative for  back pain, gait problem, neck pain and neck stiffness.  Skin: Positive for venous stasis dermatitis on the right leg Neurological: Negative for dizziness, extremity weakness, gait problem, headaches, light-headedness and seizures.  Hematological: Negative for adenopathy. Does not bruise/bleed easily.  Psychiatric/Behavioral: Negative for confusion, depression and sleep disturbance. The patient is not nervous/anxious.     PHYSICAL EXAMINATION:  There were no vitals taken for this visit.  ECOG PERFORMANCE STATUS: 2  Physical Exam  Constitutional: Oriented to person, place, and time and well-developed, well-nourished, and in no distress.   HENT:  Head: Normocephalic and atraumatic.  Mouth/Throat: Oropharynx is clear and moist. No oropharyngeal exudate.  Eyes: Conjunctivae are normal. Right eye exhibits no discharge. Left eye exhibits no discharge. No scleral icterus.  Neck: Normal range of motion. Neck supple.  Cardiovascular: Normal rate, regular rhythm, normal heart sounds and intact distal pulses.   Pulmonary/Chest: Effort normal and breath sounds normal. No respiratory distress. No wheezes. No rales.  Abdominal: Soft. Bowel sounds are normal. Exhibits no distension and no mass. There is no tenderness.  Musculoskeletal: Normal range of motion.  Bilateral lower extremity edema. Lymphadenopathy:    No cervical adenopathy.  Neurological: Alert and oriented to person, place, and time. Exhibits normal muscle tone. Ambulates with a cane.  Skin: Skin is warm and dry. Positive for venous stasis dermatitis. Not diaphoretic. No erythema. No pallor.  Psychiatric: Mood, memory and judgment normal.  Vitals reviewed.  LABORATORY DATA: Lab Results  Component Value Date   WBC 3.3 (L) 11/01/2022   HGB 11.3 (L) 11/01/2022   HCT 34.6 (L) 11/01/2022   MCV 88.3 11/01/2022   PLT 148 (L) 11/01/2022      Chemistry      Component Value Date/Time   NA 140 03/30/2022 1145   K 4.0 03/30/2022 1145    CL 103 03/30/2022 1145   CO2 22 03/30/2022 1145   BUN 15 03/30/2022 1145   CREATININE 0.94 03/30/2022 1145   CREATININE 0.79 09/15/2020 1121      Component Value Date/Time   CALCIUM 9.0 03/30/2022 1145   ALKPHOS 87 09/15/2020 1121  AST 18 09/15/2020 1121   ALT 14 09/15/2020 1121   BILITOT 0.5 09/15/2020 1121       RADIOGRAPHIC STUDIES:  No results found.   ASSESSMENT/PLAN:  This is a very pleasant 78 year old African-American female with persistent leukocytopenia and anemia of unclear etiology Dr. Arbutus Ped initially thought was secondary to drug induced but there is no improvement in her condition after discontinuing Crestor.   She previously underwent a bone marrow biopsy and aspirate which showed no concerning bone marrow abnormalities except for slight increase in plasma cells but only representing 4%.   She is currently on observation.  She is taking B12 supplement and iron supplement.   She had repeat labs today that show improved leukopenia/neutropenia. She continues to have stable mild anemia with a Hbg of 10.6.  Her iron studies and ferritin are pending. Her B12 is pending.   I instructed her to continue taking her B12 and iron as she is currently taking it at this time. If I need to give her further instructions based on the final results I will call her tomorrow.    Recommends that she continue on observation with a repeat labs in 6 months or sooner if she has worsening signs of anemia or frequent infections/ low WBC. I will also repeat her myeloma labs.   The patient was advised to call immediately if she has any concerning symptoms in the interval. The patient voices understanding of current disease status and treatment options and is in agreement with the current care plan. All questions were answered. The patient knows to call the clinic with any problems, questions or concerns. We can certainly see the patient much sooner if necessary        No orders of the  defined types were placed in this encounter.    The total time spent in the appointment was 20-29 minutes  Angeleen Horney L Artyom Stencel, [name] 04/26/23

## 2023-04-27 DIAGNOSIS — M1712 Unilateral primary osteoarthritis, left knee: Secondary | ICD-10-CM | POA: Diagnosis not present

## 2023-05-01 DIAGNOSIS — H25811 Combined forms of age-related cataract, right eye: Secondary | ICD-10-CM | POA: Diagnosis not present

## 2023-05-01 DIAGNOSIS — H40023 Open angle with borderline findings, high risk, bilateral: Secondary | ICD-10-CM | POA: Diagnosis not present

## 2023-05-01 DIAGNOSIS — H35033 Hypertensive retinopathy, bilateral: Secondary | ICD-10-CM | POA: Diagnosis not present

## 2023-05-01 DIAGNOSIS — H04123 Dry eye syndrome of bilateral lacrimal glands: Secondary | ICD-10-CM | POA: Diagnosis not present

## 2023-05-02 ENCOUNTER — Inpatient Hospital Stay: Payer: Medicare Other | Attending: Physician Assistant

## 2023-05-02 ENCOUNTER — Inpatient Hospital Stay (HOSPITAL_BASED_OUTPATIENT_CLINIC_OR_DEPARTMENT_OTHER): Payer: Medicare Other | Admitting: Physician Assistant

## 2023-05-02 VITALS — BP 126/64 | HR 68 | Temp 97.6°F | Resp 20 | Wt 254.9 lb

## 2023-05-02 DIAGNOSIS — D649 Anemia, unspecified: Secondary | ICD-10-CM | POA: Insufficient documentation

## 2023-05-02 DIAGNOSIS — Z923 Personal history of irradiation: Secondary | ICD-10-CM | POA: Insufficient documentation

## 2023-05-02 DIAGNOSIS — D539 Nutritional anemia, unspecified: Secondary | ICD-10-CM

## 2023-05-02 DIAGNOSIS — M8580 Other specified disorders of bone density and structure, unspecified site: Secondary | ICD-10-CM | POA: Insufficient documentation

## 2023-05-02 DIAGNOSIS — Z7951 Long term (current) use of inhaled steroids: Secondary | ICD-10-CM | POA: Diagnosis not present

## 2023-05-02 DIAGNOSIS — M7989 Other specified soft tissue disorders: Secondary | ICD-10-CM | POA: Diagnosis not present

## 2023-05-02 DIAGNOSIS — I872 Venous insufficiency (chronic) (peripheral): Secondary | ICD-10-CM | POA: Insufficient documentation

## 2023-05-02 DIAGNOSIS — D72819 Decreased white blood cell count, unspecified: Secondary | ICD-10-CM | POA: Insufficient documentation

## 2023-05-02 DIAGNOSIS — K219 Gastro-esophageal reflux disease without esophagitis: Secondary | ICD-10-CM | POA: Diagnosis not present

## 2023-05-02 DIAGNOSIS — Z79899 Other long term (current) drug therapy: Secondary | ICD-10-CM | POA: Diagnosis not present

## 2023-05-02 DIAGNOSIS — I10 Essential (primary) hypertension: Secondary | ICD-10-CM | POA: Diagnosis not present

## 2023-05-02 DIAGNOSIS — D472 Monoclonal gammopathy: Secondary | ICD-10-CM | POA: Diagnosis not present

## 2023-05-02 DIAGNOSIS — Z860101 Personal history of adenomatous and serrated colon polyps: Secondary | ICD-10-CM | POA: Diagnosis not present

## 2023-05-02 DIAGNOSIS — R011 Cardiac murmur, unspecified: Secondary | ICD-10-CM | POA: Diagnosis not present

## 2023-05-02 DIAGNOSIS — Z7901 Long term (current) use of anticoagulants: Secondary | ICD-10-CM | POA: Insufficient documentation

## 2023-05-02 LAB — CBC WITH DIFFERENTIAL (CANCER CENTER ONLY)
Abs Immature Granulocytes: 0.02 10*3/uL (ref 0.00–0.07)
Basophils Absolute: 0 10*3/uL (ref 0.0–0.1)
Basophils Relative: 1 %
Eosinophils Absolute: 0.2 10*3/uL (ref 0.0–0.5)
Eosinophils Relative: 4 %
HCT: 32.4 % — ABNORMAL LOW (ref 36.0–46.0)
Hemoglobin: 10.6 g/dL — ABNORMAL LOW (ref 12.0–15.0)
Immature Granulocytes: 1 %
Lymphocytes Relative: 27 %
Lymphs Abs: 1.2 10*3/uL (ref 0.7–4.0)
MCH: 28.5 pg (ref 26.0–34.0)
MCHC: 32.7 g/dL (ref 30.0–36.0)
MCV: 87.1 fL (ref 80.0–100.0)
Monocytes Absolute: 0.4 10*3/uL (ref 0.1–1.0)
Monocytes Relative: 10 %
Neutro Abs: 2.5 10*3/uL (ref 1.7–7.7)
Neutrophils Relative %: 57 %
Platelet Count: 240 10*3/uL (ref 150–400)
RBC: 3.72 MIL/uL — ABNORMAL LOW (ref 3.87–5.11)
RDW: 15.2 % (ref 11.5–15.5)
WBC Count: 4.4 10*3/uL (ref 4.0–10.5)
nRBC: 0 % (ref 0.0–0.2)

## 2023-05-02 LAB — IRON AND IRON BINDING CAPACITY (CC-WL,HP ONLY)
Iron: 180 ug/dL — ABNORMAL HIGH (ref 28–170)
Saturation Ratios: 57 % — ABNORMAL HIGH (ref 10.4–31.8)
TIBC: 318 ug/dL (ref 250–450)
UIBC: 138 ug/dL — ABNORMAL LOW (ref 148–442)

## 2023-05-02 LAB — FERRITIN: Ferritin: 28 ng/mL (ref 11–307)

## 2023-05-02 LAB — VITAMIN B12: Vitamin B-12: 3891 pg/mL — ABNORMAL HIGH (ref 180–914)

## 2023-05-02 NOTE — Patient Instructions (Signed)
-  Your labs look stable. Your infection fighting cells (WBC) improved. Your red blood cells, are stable but lower than the normal range. Keep taking your iron and B12 at this time unless I call you with different instructions based of the remaining labs that I do not have back yet at this time.

## 2023-05-03 ENCOUNTER — Telehealth: Payer: Self-pay

## 2023-05-03 ENCOUNTER — Encounter: Payer: Self-pay | Admitting: Physician Assistant

## 2023-05-03 NOTE — Progress Notes (Unsigned)
 B12 is very high. Would recommend discontinuing the B12 supplement at this time.

## 2023-05-03 NOTE — Telephone Encounter (Addendum)
 Patient was out of the house, so I spoke with patients spouse, Bridget Cortez in regards to lab work and medication management.  Per Providers- patients B12 blood level is high and needs to reduce the B12 and iron supplement to 1-2 times per week.  Spouse verbalized understanding and will relay the message to patient.   Change made to b12 supplement per providers- Spoke with patient and confirmed that she needs to stop taking the b12 supplement and only continue the iron 1-2 times per week.

## 2023-05-04 ENCOUNTER — Telehealth: Payer: Self-pay | Admitting: Physician Assistant

## 2023-05-04 ENCOUNTER — Telehealth: Payer: Self-pay | Admitting: Internal Medicine

## 2023-05-04 NOTE — Telephone Encounter (Signed)
 Scheduled appointment per staff message. The patient is aware of the appointment details.

## 2023-06-02 ENCOUNTER — Other Ambulatory Visit: Payer: Self-pay | Admitting: Cardiovascular Disease

## 2023-06-02 DIAGNOSIS — I4892 Unspecified atrial flutter: Secondary | ICD-10-CM

## 2023-06-04 ENCOUNTER — Other Ambulatory Visit: Payer: Self-pay

## 2023-06-04 DIAGNOSIS — I4892 Unspecified atrial flutter: Secondary | ICD-10-CM

## 2023-06-04 MED ORDER — RIVAROXABAN 20 MG PO TABS: 20.0000 mg | ORAL_TABLET | Freq: Every day | ORAL | 1 refills | Status: DC

## 2023-06-04 NOTE — Telephone Encounter (Signed)
 Received faxed Xarelto  refill request from [facility].  Pt last saw Pilar Bridge, [name] on 04/02/23,  last labs 07/24/22 CMP at [facility] will call to get results faxed to us .  Wilol await results to refill rx. Age 78, weight 115.6kg.

## 2023-06-04 NOTE — Telephone Encounter (Signed)
 Received labwork from PCP, Creat 0.8 on 07/24/22. Will forward results to chart and refill rx. CrCl 107.47, based on CrCl pt is on appropriate dosage of Xarelto  20mg  every day for afib.  Will refill rx.

## 2023-06-29 ENCOUNTER — Encounter (INDEPENDENT_AMBULATORY_CARE_PROVIDER_SITE_OTHER): Payer: Medicare Other | Admitting: Ophthalmology

## 2023-06-29 DIAGNOSIS — H35033 Hypertensive retinopathy, bilateral: Secondary | ICD-10-CM | POA: Diagnosis not present

## 2023-06-29 DIAGNOSIS — H43813 Vitreous degeneration, bilateral: Secondary | ICD-10-CM

## 2023-06-29 DIAGNOSIS — I10 Essential (primary) hypertension: Secondary | ICD-10-CM | POA: Diagnosis not present

## 2023-06-29 DIAGNOSIS — H3509 Other intraretinal microvascular abnormalities: Secondary | ICD-10-CM

## 2023-07-16 DIAGNOSIS — Z90710 Acquired absence of both cervix and uterus: Secondary | ICD-10-CM | POA: Diagnosis not present

## 2023-07-16 DIAGNOSIS — Z1330 Encounter for screening examination for mental health and behavioral disorders, unspecified: Secondary | ICD-10-CM | POA: Diagnosis not present

## 2023-07-16 DIAGNOSIS — Z1231 Encounter for screening mammogram for malignant neoplasm of breast: Secondary | ICD-10-CM | POA: Diagnosis not present

## 2023-07-16 DIAGNOSIS — Z1211 Encounter for screening for malignant neoplasm of colon: Secondary | ICD-10-CM | POA: Diagnosis not present

## 2023-07-16 DIAGNOSIS — Z01419 Encounter for gynecological examination (general) (routine) without abnormal findings: Secondary | ICD-10-CM | POA: Diagnosis not present

## 2023-07-16 DIAGNOSIS — Z13820 Encounter for screening for osteoporosis: Secondary | ICD-10-CM | POA: Diagnosis not present

## 2023-07-17 DIAGNOSIS — M1712 Unilateral primary osteoarthritis, left knee: Secondary | ICD-10-CM | POA: Diagnosis not present

## 2023-07-20 ENCOUNTER — Telehealth: Payer: Self-pay | Admitting: *Deleted

## 2023-07-20 NOTE — Telephone Encounter (Signed)
   Name: Bridget Cortez  DOB: 1945-02-04  MRN: [id number]  Primary Cardiologist: Lonni Cash, MD   Preoperative team, please contact this patient and set up a phone call appointment for further preoperative risk assessment. Please obtain consent and complete medication review. Last seen by general cardiology, [name] on 07/24/2022. Thank you for your help.  I confirm that guidance regarding antiplatelet and oral anticoagulation therapy has been completed and, if necessary, noted below. Patient is on Xarelto  but not requested to be held by surgeon for clearance.   I also confirmed the patient resides in the state of [place] . As per [facility] Medical Board telemedicine laws, the patient must reside in the state in which the provider is licensed.   [name], NP 07/20/2023, 3:18 PM [facility]

## 2023-07-20 NOTE — Telephone Encounter (Signed)
   Pre-operative Risk Assessment    Patient Name: Bridget Cortez  DOB: 04/22/1945 MRN: [id number]   Date of last office visit: 04/02/23 JODIE PASSEY, [facility] Date of next office visit: 10/04/23 JODIE PASSEY, [facility]   Request for Surgical Clearance    Procedure:  LEFT TOTAL KNEE ARTHROPLASTY  Date of Surgery:  Clearance 10/15/23                                Surgeon:  DR. DEMPSEY MOAN Surgeon's Group or Practice Name:  [facility] Phone number:  [phone] KIRKE MELLOW Fax number:  [phone]   Type of Clearance Requested:   - Medical ; NONE INDICATED TO BE HELD   Type of Anesthesia:  CHOICE   Additional requests/questions:    Bonney Niels Jest   07/20/2023, 12:25 PM

## 2023-07-20 NOTE — Telephone Encounter (Signed)
 Tried calling patient to scheduled televisit no answer left a detailed vm to call back and schedule

## 2023-07-23 NOTE — Telephone Encounter (Signed)
 Patient has appointment scheduled with [facility]  10-04-23

## 2023-08-06 ENCOUNTER — Telehealth: Payer: Self-pay | Admitting: Medical Oncology

## 2023-08-06 ENCOUNTER — Inpatient Hospital Stay

## 2023-08-06 NOTE — Telephone Encounter (Signed)
 Pt appts for October were r/s to sept due to L TKR in 10/20/23

## 2023-08-06 NOTE — Telephone Encounter (Signed)
 Left TKR  scheduled for  10/15/23 with Dr Hiram. Pt asked to move up October appts to Sept  before surgery .  Appts changed and pt confirmed.

## 2023-08-21 DIAGNOSIS — E038 Other specified hypothyroidism: Secondary | ICD-10-CM | POA: Diagnosis not present

## 2023-08-21 DIAGNOSIS — E782 Mixed hyperlipidemia: Secondary | ICD-10-CM | POA: Diagnosis not present

## 2023-08-21 DIAGNOSIS — K219 Gastro-esophageal reflux disease without esophagitis: Secondary | ICD-10-CM | POA: Diagnosis not present

## 2023-08-21 DIAGNOSIS — Z1212 Encounter for screening for malignant neoplasm of rectum: Secondary | ICD-10-CM | POA: Diagnosis not present

## 2023-08-21 DIAGNOSIS — I119 Hypertensive heart disease without heart failure: Secondary | ICD-10-CM | POA: Diagnosis not present

## 2023-08-21 DIAGNOSIS — E7849 Other hyperlipidemia: Secondary | ICD-10-CM | POA: Diagnosis not present

## 2023-08-22 NOTE — Telephone Encounter (Signed)
 Patient has a appt scheduled on 9/11 to address preop clearance

## 2023-08-22 NOTE — Telephone Encounter (Signed)
Patient returned Pre-op call. 

## 2023-08-28 DIAGNOSIS — D72819 Decreased white blood cell count, unspecified: Secondary | ICD-10-CM | POA: Diagnosis not present

## 2023-08-28 DIAGNOSIS — R82998 Other abnormal findings in urine: Secondary | ICD-10-CM | POA: Diagnosis not present

## 2023-08-28 DIAGNOSIS — K219 Gastro-esophageal reflux disease without esophagitis: Secondary | ICD-10-CM | POA: Diagnosis not present

## 2023-08-28 DIAGNOSIS — E782 Mixed hyperlipidemia: Secondary | ICD-10-CM | POA: Diagnosis not present

## 2023-08-28 DIAGNOSIS — Z0000 Encounter for general adult medical examination without abnormal findings: Secondary | ICD-10-CM | POA: Diagnosis not present

## 2023-08-28 DIAGNOSIS — Z1339 Encounter for screening examination for other mental health and behavioral disorders: Secondary | ICD-10-CM | POA: Diagnosis not present

## 2023-08-28 DIAGNOSIS — F419 Anxiety disorder, unspecified: Secondary | ICD-10-CM | POA: Diagnosis not present

## 2023-08-28 DIAGNOSIS — I119 Hypertensive heart disease without heart failure: Secondary | ICD-10-CM | POA: Diagnosis not present

## 2023-08-28 DIAGNOSIS — J4521 Mild intermittent asthma with (acute) exacerbation: Secondary | ICD-10-CM | POA: Diagnosis not present

## 2023-08-28 DIAGNOSIS — H34219 Partial retinal artery occlusion, unspecified eye: Secondary | ICD-10-CM | POA: Diagnosis not present

## 2023-08-28 DIAGNOSIS — Z1331 Encounter for screening for depression: Secondary | ICD-10-CM | POA: Diagnosis not present

## 2023-08-28 DIAGNOSIS — Q078 Other specified congenital malformations of nervous system: Secondary | ICD-10-CM | POA: Diagnosis not present

## 2023-08-28 DIAGNOSIS — E038 Other specified hypothyroidism: Secondary | ICD-10-CM | POA: Diagnosis not present

## 2023-08-28 DIAGNOSIS — I10 Essential (primary) hypertension: Secondary | ICD-10-CM | POA: Diagnosis not present

## 2023-08-28 DIAGNOSIS — M1990 Unspecified osteoarthritis, unspecified site: Secondary | ICD-10-CM | POA: Diagnosis not present

## 2023-08-28 DIAGNOSIS — I4891 Unspecified atrial fibrillation: Secondary | ICD-10-CM | POA: Diagnosis not present

## 2023-09-07 ENCOUNTER — Telehealth: Payer: Self-pay | Admitting: Student

## 2023-09-07 DIAGNOSIS — I872 Venous insufficiency (chronic) (peripheral): Secondary | ICD-10-CM | POA: Diagnosis not present

## 2023-09-07 DIAGNOSIS — M1712 Unilateral primary osteoarthritis, left knee: Secondary | ICD-10-CM | POA: Diagnosis not present

## 2023-09-07 NOTE — Telephone Encounter (Signed)
 Patient states she saw her surgeon Dr. Hiram today and showed him the swelling she has in her left leg from the knee down to her ankle. Patient states Dr. Hiram advised her to contact our office to see if she could be seen sooner than 9/11 for assessment/preoop clearance.  Patient is taking torsemide  20 mg daily. She denies any SOB. Advised she elevate her leg when sitting and limit salt intake. Rescheduled patient  to see Charlies Arthur, [name] on 9/4 to evaluate swelling and address preop clearance.  Patient placed on waitlist for sooner appt if it becomes available.  Patient verbalized understanding of the above.

## 2023-09-07 NOTE — Telephone Encounter (Signed)
 Pt requesting c/b regarding appt on 9/12. Please advise

## 2023-09-13 ENCOUNTER — Emergency Department (HOSPITAL_COMMUNITY)
Admission: EM | Admit: 2023-09-13 | Discharge: 2023-09-13 | Disposition: A | Discharge status: Home / Self Care | Attending: Emergency Medicine | Admitting: Emergency Medicine

## 2023-09-13 ENCOUNTER — Ambulatory Visit
Admission: EM | Admit: 2023-09-13 | Discharge: 2023-09-13 | Disposition: A | Discharge status: Home / Self Care | Attending: Family Medicine | Admitting: Family Medicine

## 2023-09-13 ENCOUNTER — Encounter (HOSPITAL_COMMUNITY): Payer: Self-pay

## 2023-09-13 DIAGNOSIS — R040 Epistaxis: Secondary | ICD-10-CM | POA: Insufficient documentation

## 2023-09-13 DIAGNOSIS — Z7901 Long term (current) use of anticoagulants: Secondary | ICD-10-CM

## 2023-09-13 DIAGNOSIS — Z87891 Personal history of nicotine dependence: Secondary | ICD-10-CM | POA: Insufficient documentation

## 2023-09-13 DIAGNOSIS — I10 Essential (primary) hypertension: Secondary | ICD-10-CM | POA: Insufficient documentation

## 2023-09-13 DIAGNOSIS — J45909 Unspecified asthma, uncomplicated: Secondary | ICD-10-CM | POA: Insufficient documentation

## 2023-09-13 DIAGNOSIS — F419 Anxiety disorder, unspecified: Secondary | ICD-10-CM | POA: Diagnosis not present

## 2023-09-13 MED ORDER — OXYMETAZOLINE HCL 0.05 % NA SOLN
1.0000 | Freq: Two times a day (BID) | NASAL | Status: DC
  Administered 2023-09-13: 1 via NASAL

## 2023-09-13 MED ORDER — OXYMETAZOLINE HCL 0.05 % NA SOLN: 1.0000 | Freq: Two times a day (BID) | NASAL | Status: DC

## 2023-09-13 MED ORDER — SILVER NITRATE-POT NITRATE 75-25 % EX MISC
1.0000 | Freq: Once | CUTANEOUS | Status: AC
  Administered 2023-09-13: 1 via TOPICAL
  Filled 2023-09-13: qty 10

## 2023-09-13 NOTE — ED Triage Notes (Addendum)
 Pt states she woke ~8am with a nose bleed-has been off and on-concerned that she is on xarelto -NAD-steady gait

## 2023-09-13 NOTE — ED Provider Triage Note (Signed)
 Emergency Medicine Provider Triage Evaluation Note  Bridget Cortez , a 78 y.o. female  was evaluated in triage.  Pt complains of epistaxis.  Reports this began this morning after she sprayed Flonase  in her nose.  She went to urgent care and they were able to get the bleeding under control, however reports this started bleeding again.  She has no active bleeding on my exam.  Blood all coming out of the right nostril.  She feels well otherwise.  She is on Xarelto .  Review of Systems  Positive:  Negative:   Physical Exam  BP (!) 146/116 (BP Location: Right Arm)   Pulse 82   Temp 98.2 F (36.8 C) (Oral)   Resp 16   SpO2 99%  Gen:   Awake, no distress   Resp:  Normal effort  MSK:   Moves extremities without difficulty  Other:  Right anterior knee with scabbing present.  No active bleeding in the nose or in the oropharynx  Medical Decision Making  Medically screening exam initiated at 12:56 PM.  Appropriate orders placed.  Bridget Cortez was informed that the remainder of the evaluation will be completed by another provider, this initial triage assessment does not replace that evaluation, and the importance of remaining in the ED until their evaluation is complete.     Bridget Lauraine LABOR, [name] 09/13/23 1257

## 2023-09-13 NOTE — ED Triage Notes (Signed)
 Pt arrived via POV, c/o nose bleed. On thinners. Was seen at [facility], afrin given. Slowed bleeding. Bleeding controlled in triage, slight dripping every couple of minutes.

## 2023-09-13 NOTE — ED Provider Notes (Addendum)
 [facility] URGENT CARE CENTER  Note:  This document was prepared using Conservation officer, historic buildings and may include unintentional dictation errors.  MRN: [id number] DOB: [birth year]  Subjective:   Bridget Cortez is a 78 y.o. female presenting for epistaxis since this morning. No fever, pain, trauma. Patient uses Flonase . She is on Xarelto .   No current facility-administered medications for this encounter.  Current Outpatient Medications:    acetaminophen  (TYLENOL ) 500 MG tablet, Take 1,000 mg by mouth every 6 (six) hours as needed (pain.)., Disp: , Rfl:    albuterol  (PROVENTIL ) (5 MG/ML) 0.5% nebulizer solution, Take 0.5 mLs (2.5 mg total) by nebulization every 6 (six) hours as needed for wheezing or shortness of breath., Disp: 20 mL, Rfl: 12   amLODipine  (NORVASC ) 10 MG tablet, Take 5 mg by mouth every evening., Disp: , Rfl:    betamethasone  dipropionate 0.05 % cream, Apply 1 Application topically 2 (two) times daily as needed (itching (dermatitis))., Disp: , Rfl:    Calcium  Carbonate-Vitamin D  (CALTRATE 600+D PO), Take 1 tablet by mouth in the morning., Disp: , Rfl:    cetirizine (ZYRTEC) 10 MG tablet, Take 10 mg by mouth in the morning., Disp: , Rfl:    Cyanocobalamin  (VITAMIN B-12 PO), Take 1 tablet by mouth daily with lunch., Disp: , Rfl:    esomeprazole (NEXIUM) 40 MG capsule, Take 40 mg by mouth 2 (two) times daily before a meal. Before breakfast & before supper, Disp: , Rfl:    Ferrous Sulfate (IRON PO), Take 1 tablet by mouth daily., Disp: , Rfl:    fluticasone  (FLONASE ) 50 MCG/ACT nasal spray, Place 1 spray into both nostrils in the morning and at bedtime., Disp: , Rfl:    Fluticasone -Salmeterol (ADVAIR) 250-50 MCG/DOSE AEPB, Inhale 1 puff into the lungs in the morning and at bedtime., Disp: , Rfl:    loperamide  (IMODIUM ) 2 MG capsule, Take 1 capsule (2 mg total) by mouth 2 (two) times daily as needed for diarrhea or loose stools., Disp: 14 capsule, Rfl: 0    Multiple Vitamin (MULTIVITAMIN) tablet, Take 1 tablet by mouth in the morning., Disp: , Rfl:    nebivolol  (BYSTOLIC ) 5 MG tablet, Take 1 tablet by mouth once daily, Disp: 90 tablet, Rfl: 2   oxyCODONE  (OXY IR/ROXICODONE ) 5 MG immediate release tablet, Take 0.5-1 tablets (2.5-5 mg total) by mouth every 6 (six) hours as needed for severe pain., Disp: 15 tablet, Rfl: 0   potassium chloride  SA (KLOR-CON ) 20 MEQ tablet, Take 20 mEq by mouth 3 (three) times daily., Disp: , Rfl:    rivaroxaban  (XARELTO ) 20 MG TABS tablet, Take 1 tablet (20 mg total) by mouth daily with supper., Disp: 90 tablet, Rfl: 1   rosuvastatin  (CRESTOR ) 5 MG tablet, Take 5 mg by mouth every evening., Disp: , Rfl:    simethicone  (MYLICON) 125 MG chewable tablet, Chew 125 mg by mouth every 6 (six) hours as needed for flatulence., Disp: , Rfl:    SSD 1 % cream, Apply 1 Application topically 2 (two) times daily as needed (chafing)., Disp: , Rfl:    telmisartan (MICARDIS) 40 MG tablet, Take 40 mg by mouth in the morning., Disp: , Rfl:    torsemide  (DEMADEX ) 20 MG tablet, Take 1 tablet (20 mg total) by mouth daily., Disp: 90 tablet, Rfl: 2   triamcinolone  cream (KENALOG) 0.1 %, Apply 1 application  topically 2 (two) times daily as needed (itching.)., Disp: , Rfl:    vitamin C  (ASCORBIC ACID ) 500 MG  tablet, Take 500 mg by mouth in the morning., Disp: , Rfl:    Allergies  Allergen Reactions   Ephedrine  Shortness Of Breath   Cortisone Other (See Comments)    Severe muscle spasms    Past Medical History:  Diagnosis Date   Abnormal Pap smear 2006   vaginal medicine according to patient   Allergy    Arthritis    Asthma    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 eye    cataract removed.   Chiari malformation    Diverticulosis    Dysrhythmia    SVT   GERD (gastroesophageal reflux disease)    H/O osteopenia    08/2006   H/O varicella     Heart murmur    Hypertension    Monilial vaginitis 07/2007   Multiple allergies    Ovarian cyst    Personal history of colonic adenomas 03/13/2012   Personal history of radiation therapy 1990   Pneumonia 07/2014   VIN III (vulvar intraepithelial neoplasia III) 03/2009   Yeast infection      Past Surgical History:  Procedure Laterality Date   ABDOMINAL HYSTERECTOMY     ABDOMINAL SURGERY     BREAST DUCTAL SYSTEM EXCISION Right 08/08/2022   Procedure: RIGHT BREAST CENTRAL DUCT EXCISION;  Surgeon: Aron Shoulders, MD;  Location: [facility] OR;  Service: General;  Laterality: Right;   BREAST EXCISIONAL BIOPSY Right    benign   BREAST LUMPECTOMY Left 1990   BREAST SURGERY     Lumpectomy, left breast   CARDIOVERSION N/A 07/28/2020   Procedure: CARDIOVERSION;  Surgeon: Kate Lonni CROME, MD;  Location: [facility] ENDOSCOPY;  Service: Cardiovascular;  Laterality: N/A;   CATARACT EXTRACTION Left 2018   COLONOSCOPY     COLONOSCOPY W/ BIOPSIES     EYE SURGERY     HERNIA REPAIR     KNEE SURGERY     right   lt. eye aneurysm surgery Left 2017   MOUTH SURGERY  2019   rt. side gum surgery  Lt. done 3 years ago   RADIOACTIVE SEED GUIDED EXCISIONAL BREAST BIOPSY Right 07/09/2018   Procedure: RADIOACTIVE SEED GUIDED EXCISIONAL RIGHT  BREAST BIOPSY;  Surgeon: Aron Shoulders, MD;  Location: [facility] OR;  Service: General;  Laterality: Right;   TOTAL KNEE ARTHROPLASTY Right 09/09/2019   Procedure: RIGHT TOTAL KNEE ARTHROPLASTY;  Surgeon: Sheril Coy, MD;  Location: [facility] ORS;  Service: Orthopedics;  Laterality: Right;    Family History  Problem Relation Age of Onset   Cancer Mother    Heart failure Other    Colon cancer Neg Hx    Esophageal cancer Neg Hx    Rectal cancer Neg Hx    Stomach cancer Neg Hx    Colon polyps Neg Hx     Social History   Tobacco Use   Smoking status: Former    Current packs/day: 0.00    Types: Cigarettes    Quit date: 01/23/1978    Years since quitting: 45.6   Smokeless tobacco:  Never  Vaping Use   Vaping status: Never Used  Substance Use Topics   Alcohol  use: No   Drug use: No    ROS   Objective:   Vitals: BP 138/81 (BP Location: Right Arm)   Pulse 83   Temp 97.8 F (36.6 C) (Oral)   Resp 20   SpO2 97%  Physical Exam Constitutional:      General: She is not in acute distress.    Appearance: Normal appearance. She is well-developed. She is not ill-appearing, toxic-appearing or diaphoretic.  HENT:     Head: Normocephalic and atraumatic.     Nose:     Right Nostril: Epistaxis present.     Comments: Attempted pressure both internally and externally for 10 minutes. Administered one spray of oxymetazoline . Patient did not tolerate this well.     Mouth/Throat:     Mouth: Mucous membranes are moist.  Eyes:     General: No scleral icterus.       Right eye: No discharge.        Left eye: No discharge.     Extraocular Movements: Extraocular movements intact.  Cardiovascular:     Rate and Rhythm: Normal rate.  Pulmonary:     Effort: Pulmonary effort is normal.  Skin:    General: Skin is warm and dry.  Neurological:     General: No focal deficit present.     Mental Status: She is alert and oriented to person, place, and time.  Psychiatric:        Mood and Affect: Mood normal.        Behavior: Behavior normal.     Assessment and Plan :   PDMP not reviewed this encounter.  1. Epistaxis   2. Anticoagulated    Unfortunately, clinic does not carry nasal packing.  Patient had minimal bleeding by the end of her visit.  Advised that she contact an ENT specialist for urgent follow-up to consider cautery.  Use oxymetazoline  twice daily for the next 2 days.  Hold Flonase . Maintain strict ER precautions.  Counseled patient on potential for adverse effects with medications prescribed today, patient verbalized understanding.     Christopher Savannah, [place] 09/13/23 1144

## 2023-09-13 NOTE — Discharge Instructions (Addendum)
 Use medication given in urgent care as needed at home.  Compress nostrils and lean forward if bleeding continues.  I have attached ENT follow-up information.  The group is [facility].  It is also recommended to follow-up with primary care if bleeding continues.

## 2023-09-13 NOTE — Discharge Instructions (Addendum)
 Stop using Flonase  as this can be a source of nosebleeds. Use Afrin twice daily for the next 2 days. If you continue to have the nosebleed today, then go to the ER. Otherwise, follow up with the ENT specialist as soon as possible.

## 2023-09-13 NOTE — ED Provider Notes (Signed)
 [place] EMERGENCY DEPARTMENT AT [facility] Provider Note   CSN: [id number] Arrival date & time: 09/13/23  1209     Patient presents with: Epistaxis   Bridget Cortez is a 78 y.o. female.  78 year old female presents to ED with complaints of epistaxis.  Patient reports she was seen at urgent care prior to arrival ED and was given Afrin. Patient is on blood thinners.  Patient advised she was seen in urgent care earlier today and they gave her Afrin.  They advised her to follow-up with ENT.  Or return to the ED if you continue to bleeding.  Patient may return to ED shortly after leaving urgent care.  Patient has no other symptoms and denies headache, shortness of breath, nausea, vomiting, or abdominal pain.     Prior to Admission medications   Medication Sig Start Date End Date Taking? Authorizing Provider  acetaminophen  (TYLENOL ) 500 MG tablet Take 1,000 mg by mouth every 6 (six) hours as needed (pain.).    [provider]  albuterol  (PROVENTIL ) (5 MG/ML) 0.5% nebulizer solution Take 0.5 mLs (2.5 mg total) by nebulization every 6 (six) hours as needed for wheezing or shortness of breath. 02/23/18   Curatolo, Adam, DO  amLODipine  (NORVASC ) 10 MG tablet Take 5 mg by mouth every evening.    [provider]  betamethasone  dipropionate 0.05 % cream Apply 1 Application topically 2 (two) times daily as needed (itching (dermatitis)).    [provider]  Calcium  Carbonate-Vitamin D  (CALTRATE 600+D PO) Take 1 tablet by mouth in the morning.    [provider]  cetirizine (ZYRTEC) 10 MG tablet Take 10 mg by mouth in the morning.    [provider]  Cyanocobalamin  (VITAMIN B-12 PO) Take 1 tablet by mouth daily with lunch.    [provider]  esomeprazole (NEXIUM) 40 MG capsule Take 40 mg by mouth 2 (two) times daily before a meal. Before breakfast & before supper    [provider]  Ferrous Sulfate (IRON PO) Take 1  tablet by mouth daily.    [provider]  fluticasone  (FLONASE ) 50 MCG/ACT nasal spray Place 1 spray into both nostrils in the morning and at bedtime. 03/16/12   [provider]  Fluticasone -Salmeterol (ADVAIR) 250-50 MCG/DOSE AEPB Inhale 1 puff into the lungs in the morning and at bedtime.    [provider]  loperamide  (IMODIUM ) 2 MG capsule Take 1 capsule (2 mg total) by mouth 2 (two) times daily as needed for diarrhea or loose stools. 04/06/23   Christopher Savannah, [name]  Multiple Vitamin (MULTIVITAMIN) tablet Take 1 tablet by mouth in the morning.    [provider]  nebivolol  (BYSTOLIC ) 5 MG tablet Take 1 tablet by mouth once daily 11/28/22   Fernande Elspeth BROCKS, MD  oxyCODONE  (OXY IR/ROXICODONE ) 5 MG immediate release tablet Take 0.5-1 tablets (2.5-5 mg total) by mouth every 6 (six) hours as needed for severe pain. 08/08/22   Aron Shoulders, MD  potassium chloride  SA (KLOR-CON ) 20 MEQ tablet Take 20 mEq by mouth 3 (three) times daily.    [provider]  rivaroxaban  (XARELTO ) 20 MG TABS tablet Take 1 tablet (20 mg total) by mouth daily with supper. 06/04/23   Verlin Lonni BIRCH, MD  rosuvastatin  (CRESTOR ) 5 MG tablet Take 5 mg by mouth every evening.    [provider]  simethicone  (MYLICON) 125 MG chewable tablet Chew 125 mg by mouth every 6 (six) hours as needed for flatulence.  [provider]  SSD 1 % cream Apply 1 Application topically 2 (two) times daily as needed (chafing). 07/25/22   [provider]  telmisartan (MICARDIS) 40 MG tablet Take 40 mg by mouth in the morning.    [provider]  torsemide  (DEMADEX ) 20 MG tablet Take 1 tablet (20 mg total) by mouth daily. 02/05/23   Fernande Elspeth BROCKS, MD  triamcinolone  cream (KENALOG) 0.1 % Apply 1 application  topically 2 (two) times daily as needed (itching.). 07/05/20   [provider]  vitamin C  (ASCORBIC ACID ) 500 MG tablet Take 500 mg by mouth in the morning.     [provider]    Allergies: Ephedrine  and Cortisone    Review of Systems  HENT:  Positive for nosebleeds.   All other systems reviewed and are negative.   Updated Vital Signs BP (!) 140/80   Pulse 80   Temp 98 F (36.7 C) (Oral)   Resp 16   SpO2 99%   Physical Exam Vitals and nursing note reviewed.  Constitutional:      General: She is not in acute distress.    Appearance: Normal appearance. She is not ill-appearing.  HENT:     Head: Normocephalic and atraumatic.     Nose:     Right Nostril: Epistaxis present.     Mouth/Throat:     Mouth: Mucous membranes are moist.     Pharynx: Oropharynx is clear.  Eyes:     Extraocular Movements: Extraocular movements intact.     Pupils: Pupils are equal, round, and reactive to light.  Cardiovascular:     Rate and Rhythm: Normal rate.  Pulmonary:     Effort: Pulmonary effort is normal. No respiratory distress.  Abdominal:     Tenderness: There is no guarding.  Musculoskeletal:        General: Normal range of motion.     Cervical back: Normal range of motion.  Skin:    General: Skin is warm and dry.  Neurological:     General: No focal deficit present.     Mental Status: She is alert.  Psychiatric:        Mood and Affect: Mood is anxious.        Behavior: Behavior normal.     (all labs ordered are listed, but only abnormal results are displayed) Labs Reviewed - No data to display  EKG: None  Radiology: No results found.  78 y.o. female presents to the ED for concern of Epistaxis     This involves an extensive number of treatment options, and is a complaint that carries with it a high risk of complications and morbidity.  The emergent differential diagnosis prior to evaluation includes, but is not limited to: Epistaxis, coagulopathy, nasal abscess, facial trauma.  This is not an exhaustive differential.   Past Medical History / Co-morbidities / Social History: Hx of anxiety, hypertension, SVT,  asthma Social Determinants of Health include: former Smoker  Additional History:  Obtained by chart review.  Notably Ortho management of knee arthroplasty.   ED Course / Critical Interventions: Pt well-appearing on exam sitting in triage room.  Patient has very minimal blood on the tissue during initial encounter.  Patient explained the story of how urgent care told him to follow-up to ED if bleeding continue.  Patient is very anxious on exam.  Patient was advised of treatment for epistaxis.  Patient was very anxious about the treatment and advised if there is anything else we could  do.  I suggested silver  nitrate stick for assistance in clotting.  Patient agreed to treatment plan.  The nares had mild irritation on the right and no irritation in the left.  Some blood was noted in the nare and 1 area of bleeding was noted in the lateral aspect of the right nare.  Patient tolerated the silver  nitrate fairly well.  She was calm after application.  Patient was advised that this may not completely fix the nosebleed.  She agreed to the understanding and was given instructions on how to stop the bleeding at home.  Patient was advised that she cannot stop the bleeding at home to return to ED.  Patient was also advised she could see ENT for the nosebleeds he was given information from referral.  Patient agreed to understanding of treatment and was comfortable discharge.  I have reviewed the patients home medicines and have made adjustments as needed.  Disposition: Considered admission and after reviewing the patient's encounter today, I feel that the patient would benefit from discharge and ENT follow-up.  Discussed course of treatment with the patient, whom demonstrated understanding.  Patient in agreement and has no further questions.    I discussed this case with my attending, Dr. Charlyn, who agreed with the proposed treatment course and cosigned this note including patient's presenting symptoms, physical  exam, and planned diagnostics and interventions.  Attending physician stated agreement with plan or made changes to plan which were implemented.     This chart was dictated using voice recognition software.  Despite best efforts to proofread, errors can occur which can change the documentation meaning.   Medications Ordered in the ED  silver  nitrate applicators applicator 1 Stick (1 Stick Topical Given 09/13/23 1510)     Final diagnoses:  Epistaxis    ED Discharge Orders     None          Myriam Fonda GORMAN DEVONNA 09/13/23 1602    Charlyn Sora, MD 09/14/23 [phone]

## 2023-09-14 ENCOUNTER — Emergency Department (HOSPITAL_COMMUNITY)
Admission: EM | Admit: 2023-09-14 | Discharge: 2023-09-14 | Disposition: A | Discharge status: Home / Self Care | Attending: Emergency Medicine | Admitting: Emergency Medicine

## 2023-09-14 ENCOUNTER — Encounter (HOSPITAL_COMMUNITY): Payer: Self-pay

## 2023-09-14 DIAGNOSIS — R58 Hemorrhage, not elsewhere classified: Secondary | ICD-10-CM | POA: Diagnosis not present

## 2023-09-14 DIAGNOSIS — Z853 Personal history of malignant neoplasm of breast: Secondary | ICD-10-CM | POA: Diagnosis not present

## 2023-09-14 DIAGNOSIS — J45909 Unspecified asthma, uncomplicated: Secondary | ICD-10-CM | POA: Insufficient documentation

## 2023-09-14 DIAGNOSIS — R040 Epistaxis: Secondary | ICD-10-CM | POA: Insufficient documentation

## 2023-09-14 DIAGNOSIS — I10 Essential (primary) hypertension: Secondary | ICD-10-CM | POA: Insufficient documentation

## 2023-09-14 MED ORDER — ACETAMINOPHEN 325 MG PO TABS
650.0000 mg | ORAL_TABLET | Freq: Once | ORAL | Status: AC
  Administered 2023-09-14: 650 mg via ORAL
  Filled 2023-09-14: qty 2

## 2023-09-14 MED ORDER — OXYMETAZOLINE HCL 0.05 % NA SOLN
1.0000 | Freq: Once | NASAL | Status: AC
  Administered 2023-09-14: 1 via NASAL
  Filled 2023-09-14: qty 30

## 2023-09-14 NOTE — ED Provider Notes (Signed)
 [place] EMERGENCY DEPARTMENT AT [facility] Provider Note  MDM   HPI/ROS:  Bridget Cortez is a 78 y.o. female with a medical history as below who presents for nosebleed.  Patient reports she had a nosebleed that started last night.  Prompting her to go to urgent care.  She was seen by 2 different providers, with cautery performed.  She states that she did well overnight however when she woke up this morning she noticed some bleeding prompting her return to the emergency department.  She denies any trauma, endorses Xarelto  use for A-fib.  Physical exam is notable for: - Very mild oozing from the right nare.  Appears anterior.  No conjunctival pallor.  Good capillary refill  On my initial evaluation, patient is:  -Vital signs stable. Patient afebrile, hemodynamically stable, and non-toxic appearing. -Additional history obtained from chart review  Differentials include anterior epistaxis, posterior epistaxis, coagulopathy.    Physical exam is reassuring as patient has very mild venous ooze from the right anterior nare.  Can see evidence of silver  nitrate in the right nare.  She had improvement of her symptoms with silver  nitrate yesterday however rebleed this morning.  Placed Rhino Rocket after Afrin with good cessation.  Patient did take her dose of Eliquis  last night.  Instructed patient to skip her dose tonight and restart tomorrow.  Also instructed patient on how to remove [employer].  She was discharged with instructions to leave it in place until tomorrow morning.  She already has follow-up with ENT.  Discharged in stable condition   Disposition:  I discussed the plan for discharge with the patient and/or their surrogate at bedside prior to discharge and they were in agreement with the plan and verbalized understanding of the return precautions provided. All questions answered to the best of my ability. Ultimately, the patient was discharged in stable condition  with stable vital signs. I am reassured that they are capable of close follow up and good social support at home.   Clinical Impression:  1. Epistaxis     The plan for this patient was discussed with Dr. Levander, who voiced agreement and who oversaw evaluation and treatment of this patient.   Clinical Complexity A medically appropriate history, review of systems, and physical exam was performed.  My independent interpretations of EKG, labs, and radiology are documented in the ED course above.   If decision rules were used in this patient's evaluation, they are listed below.   Click here for ABCD2, HEART and other calculatorsREFRESH Note before signing   Patient's presentation is most consistent with acute presentation with potential threat to life or bodily function.  Medical Decision Making Risk OTC drugs.    HPI/ROS      See MDM section for pertinent HPI and ROS. A complete ROS was performed with pertinent positives/negatives noted above.   Past Medical History:  Diagnosis Date   Abnormal Pap smear 2006   vaginal medicine according to patient   Allergy    Arthritis    Asthma    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 eye    cataract removed.   Chiari malformation    Diverticulosis    Dysrhythmia    SVT   GERD (gastroesophageal reflux disease)    H/O osteopenia    08/2006   H/O varicella    Heart murmur  Hypertension    Monilial vaginitis 07/2007   Multiple allergies    Ovarian cyst    Personal history of colonic adenomas 03/13/2012   Personal history of radiation therapy 1990   Pneumonia 07/2014   VIN III (vulvar intraepithelial neoplasia III) 03/2009   Yeast infection     Past Surgical History:  Procedure Laterality Date   ABDOMINAL HYSTERECTOMY     ABDOMINAL SURGERY     BREAST DUCTAL SYSTEM EXCISION Right 08/08/2022   Procedure: RIGHT BREAST CENTRAL DUCT  EXCISION;  Surgeon: Aron Shoulders, MD;  Location: [facility] OR;  Service: General;  Laterality: Right;   BREAST EXCISIONAL BIOPSY Right    benign   BREAST LUMPECTOMY Left 1990   BREAST SURGERY     Lumpectomy, left breast   CARDIOVERSION N/A 07/28/2020   Procedure: CARDIOVERSION;  Surgeon: Kate Lonni CROME, MD;  Location: [facility] ENDOSCOPY;  Service: Cardiovascular;  Laterality: N/A;   CATARACT EXTRACTION Left 2018   COLONOSCOPY     COLONOSCOPY W/ BIOPSIES     EYE SURGERY     HERNIA REPAIR     KNEE SURGERY     right   lt. eye aneurysm surgery Left 2017   MOUTH SURGERY  2019   rt. side gum surgery  Lt. done 3 years ago   RADIOACTIVE SEED GUIDED EXCISIONAL BREAST BIOPSY Right 07/09/2018   Procedure: RADIOACTIVE SEED GUIDED EXCISIONAL RIGHT  BREAST BIOPSY;  Surgeon: Aron Shoulders, MD;  Location: [facility] OR;  Service: General;  Laterality: Right;   TOTAL KNEE ARTHROPLASTY Right 09/09/2019   Procedure: RIGHT TOTAL KNEE ARTHROPLASTY;  Surgeon: Sheril Coy, MD;  Location: [facility] ORS;  Service: Orthopedics;  Laterality: Right;      Physical Exam   Vitals:   09/14/23 1030 09/14/23 1045 09/14/23 1145 09/14/23 1200  BP: 130/76 136/73 126/70 (!) 133/94  Pulse: 84 91 90 76  Resp:  16  18  Temp:      TempSrc:      SpO2: 100% 100% 100% 100%    Physical Exam HENT:     Head:     Comments: Mild venous ooze from the anterior right nare.  Eyes:     Comments: No conjunctival pallor      Procedures   If procedures were preformed on this patient, they are listed below:  Procedures   @BBSIG @   Please note that this documentation was produced with the assistance of voice-to-text technology and may contain errors.    Sharyne Darina RAMAN, MD 09/14/23 8788    Levander Houston, MD 09/17/23 [phone]

## 2023-09-14 NOTE — ED Triage Notes (Addendum)
 Pt BIB [facility] for nose bleed, right nostril. Pt was at [facility] long yesterday, they cauterized and sent her home. This morning bleeding started again and doctor advised her to come to ER. Bleeding for about an hour prior to EMS arrival, bleeding controled with gauze. Pt takes xarelto .

## 2023-09-14 NOTE — Discharge Instructions (Signed)
 You were seen today for nosebleed. While you were here we monitored your vitals, preformed a physical exam, and placed a Rhino Rocket.  Your bleeding is well-controlled at this time.    Things to do:  - Follow up with your primary care provider within the next 1-2 weeks - Please cut the string on the Rhino Rocket and remove it tomorrow morning.  Please skip your dose of Xarelto  tonight.  If the Rhino Rocket falls out please return to the ED if you have further bleeding.  Return to the emergency department if you have any new or worsening symptoms including worsening bleeding, or if you have any other concerns.

## 2023-09-15 ENCOUNTER — Encounter (HOSPITAL_COMMUNITY): Payer: Self-pay

## 2023-09-15 ENCOUNTER — Emergency Department (HOSPITAL_COMMUNITY)
Admission: EM | Admit: 2023-09-15 | Discharge: 2023-09-15 | Disposition: A | Discharge status: Home / Self Care | Attending: Emergency Medicine | Admitting: Emergency Medicine

## 2023-09-15 ENCOUNTER — Other Ambulatory Visit: Payer: Self-pay

## 2023-09-15 DIAGNOSIS — Z7901 Long term (current) use of anticoagulants: Secondary | ICD-10-CM | POA: Diagnosis not present

## 2023-09-15 DIAGNOSIS — R040 Epistaxis: Secondary | ICD-10-CM | POA: Insufficient documentation

## 2023-09-15 MED ORDER — TRANEXAMIC ACID FOR EPISTAXIS
500.0000 mg | Freq: Once | TOPICAL | Status: AC
  Administered 2023-09-15: 500 mg via TOPICAL
  Filled 2023-09-15: qty 10

## 2023-09-15 MED ORDER — HYDROMORPHONE HCL 1 MG/ML IJ SOLN
0.5000 mg | Freq: Once | INTRAMUSCULAR | Status: AC
  Administered 2023-09-15: 0.5 mg via INTRAMUSCULAR
  Filled 2023-09-15: qty 1

## 2023-09-15 MED ORDER — ACETAMINOPHEN 500 MG PO TABS
1000.0000 mg | ORAL_TABLET | Freq: Once | ORAL | Status: AC
  Administered 2023-09-15: 1000 mg via ORAL
  Filled 2023-09-15: qty 2

## 2023-09-15 NOTE — ED Notes (Signed)
 Provider, Nanavati MD in room to perform epistaxis procedure.

## 2023-09-15 NOTE — ED Triage Notes (Signed)
 Patient BIB EMS from home this morning c/o a nosebleed to the left nostril. Patient was at [facility] Thursday and had her right nostril cauterized due to bleeding. Patient was also here at [facility] yesterday for the same issue and had a rhinorocket placed. Patient was supposed to have the rhinorocket removed today, however the left nostril started bleeding. Patient is on xarelto .

## 2023-09-15 NOTE — ED Notes (Signed)
 Pt wheeled to bathroom at this time. One time non volume urine occurrence.

## 2023-09-15 NOTE — ED Provider Notes (Signed)
  EMERGENCY DEPARTMENT AT [facility] Provider Note   CSN: [id number] Arrival date & time: 09/15/23  9253     Patient presents with: Epistaxis   Bridget Cortez is a 78 y.o. female.   HPI    78 year old patient comes in with chief complaint of epistaxis. This is patient's fourth visit to the ER for epistaxis this week.  Patient currently has follow-up with Dr. Carlie first week of September.  Patient has history of A-fib and is on Xarelto .  This bleeding started recently.  Her initial visit at the urgent care, she was given Afrin spray.  Subsequently she returned to the ER and had the bleed cauterized.  Patient returned to the ER again yesterday and had a nasal packing placed.  Patient woke up this morning with significant bleeding around her left nostril.  Previous bleedings were all right-sided, which she is packed.  Patient continues to swallow some blood.  Prior to Admission medications   Medication Sig Start Date End Date Taking? Authorizing Provider  acetaminophen  (TYLENOL ) 500 MG tablet Take 1,000 mg by mouth every 6 (six) hours as needed (pain.).    [provider]  albuterol  (PROVENTIL ) (5 MG/ML) 0.5% nebulizer solution Take 0.5 mLs (2.5 mg total) by nebulization every 6 (six) hours as needed for wheezing or shortness of breath. 02/23/18   Curatolo, Adam, DO  amLODipine  (NORVASC ) 10 MG tablet Take 5 mg by mouth every evening.    [provider]  betamethasone  dipropionate 0.05 % cream Apply 1 Application topically 2 (two) times daily as needed (itching (dermatitis)).    [provider]  Calcium  Carbonate-Vitamin D  (CALTRATE 600+D PO) Take 1 tablet by mouth in the morning.    [provider]  cetirizine (ZYRTEC) 10 MG tablet Take 10 mg by mouth in the morning.    [provider]  Cyanocobalamin  (VITAMIN B-12 PO) Take 1 tablet by mouth daily with lunch.    [provider]  esomeprazole (NEXIUM) 40 MG  capsule Take 40 mg by mouth 2 (two) times daily before a meal. Before breakfast & before supper    [provider]  Ferrous Sulfate (IRON PO) Take 1 tablet by mouth daily.    [provider]  fluticasone  (FLONASE ) 50 MCG/ACT nasal spray Place 1 spray into both nostrils in the morning and at bedtime. 03/16/12   [provider]  Fluticasone -Salmeterol (ADVAIR) 250-50 MCG/DOSE AEPB Inhale 1 puff into the lungs in the morning and at bedtime.    [provider]  loperamide  (IMODIUM ) 2 MG capsule Take 1 capsule (2 mg total) by mouth 2 (two) times daily as needed for diarrhea or loose stools. 04/06/23   Christopher Savannah, [name]  Multiple Vitamin (MULTIVITAMIN) tablet Take 1 tablet by mouth in the morning.    [provider]  nebivolol  (BYSTOLIC ) 5 MG tablet Take 1 tablet by mouth once daily 11/28/22   Fernande Elspeth BROCKS, MD  oxyCODONE  (OXY IR/ROXICODONE ) 5 MG immediate release tablet Take 0.5-1 tablets (2.5-5 mg total) by mouth every 6 (six) hours as needed for severe pain. 08/08/22   Aron Shoulders, MD  potassium chloride  SA (KLOR-CON ) 20 MEQ tablet Take 20 mEq by mouth 3 (three) times daily.    [provider]  rivaroxaban  (XARELTO ) 20 MG TABS tablet Take 1 tablet (20 mg total) by mouth daily with supper. 06/04/23   Verlin Lonni BIRCH, MD  rosuvastatin  (CRESTOR ) 5 MG tablet Take 5 mg by mouth every evening.  [provider]  simethicone  (MYLICON) 125 MG chewable tablet Chew 125 mg by mouth every 6 (six) hours as needed for flatulence.    [provider]  SSD 1 % cream Apply 1 Application topically 2 (two) times daily as needed (chafing). 07/25/22   [provider]  telmisartan (MICARDIS) 40 MG tablet Take 40 mg by mouth in the morning.    [provider]  torsemide  (DEMADEX ) 20 MG tablet Take 1 tablet (20 mg total) by mouth daily. 02/05/23   Fernande Elspeth BROCKS, MD  triamcinolone  cream (KENALOG) 0.1 % Apply 1 application  topically 2  (two) times daily as needed (itching.). 07/05/20   [provider]  vitamin C  (ASCORBIC ACID ) 500 MG tablet Take 500 mg by mouth in the morning.    [provider]    Allergies: Ephedrine  and Cortisone    Review of Systems  All other systems reviewed and are negative.   Updated Vital Signs BP 120/82   Pulse 82   Temp 98.2 F (36.8 C)   Resp 18   Ht 5' 9 (1.753 m)   Wt 112.5 kg   SpO2 100%   BMI 36.62 kg/m   Physical Exam Vitals and nursing note reviewed.  Constitutional:      Comments: Extremely anxious appearing  HENT:     Nose:     Comments: After packing was removed, patient was noted to have fresh blood in the right nare anteriorly and in the turbinates Cardiovascular:     Rate and Rhythm: Normal rate.  Pulmonary:     Effort: Pulmonary effort is normal.  Neurological:     Mental Status: She is alert.     (all labs ordered are listed, but only abnormal results are displayed) Labs Reviewed - No data to display  EKG: None  Radiology: No results found.   Epistaxis Management  Date/Time: 09/15/2023 10:36 AM  Performed by: Charlyn Sora, MD Authorized by: Charlyn Sora, MD   Consent:    Consent obtained:  Verbal   Consent given by:  Patient   Risks, benefits, and alternatives were discussed: yes     Risks discussed:  Bleeding and infection   Alternatives discussed:  No treatment Universal protocol:    Procedure explained and questions answered to patient or proxy's satisfaction: yes     Immediately prior to procedure, a time out was called: yes     Patient identity confirmed:  Arm band Anesthesia:    Anesthesia method:  None Procedure details:    Treatment site:  R anterior   Treatment method:  Nasal balloon   Treatment complexity:  Limited   Treatment episode: recurring   Post-procedure details:    Assessment:  Bleeding stopped   Procedure completion:  Tolerated well, no immediate complications    Medications Ordered in  the ED  tranexamic acid  (CYKLOKAPRON ) 1000 MG/10ML topical solution 500 mg (500 mg Topical Given by Other 09/15/23 0915)  acetaminophen  (TYLENOL ) tablet 1,000 mg (1,000 mg Oral Given 09/15/23 0955)                                    Medical Decision Making Risk OTC drugs.  78 year old patient comes in with chief complaint of nasal bleeding. On exam, there is mild dripping of the blood noted.  However, the packing itself appears to be quite loose, likely not very effective.  Patient agreed to get the packing  replaced.  5.5 size rapid Rhino nasal packing was applied.  TXA was applied onto the packing. 8 cc of air was introduced in the balloon.  Patient very anxious, but she was able to tolerate the procedure. Stable for discharge at this point.  She has already contacted her PCP about Xarelto , was advised restarting the Xarelto  after stopping it for 3 days.  I agree with this plan and have informed her that if the bleeding continues, then she may want to pause Xarelto  for 24 hours after the bleeding is stopped to resume Xarelto .  Return precautions discussed with the patient and family.  For this encounter, I reviewed patient's previous records including cardiology note and also ER visits.  Final diagnoses:  Epistaxis, recurrent    ED Discharge Orders     None          Charlyn Sora, MD 09/15/23 1041

## 2023-09-15 NOTE — ED Notes (Signed)
 Patient states doctor is going to give her a short before d/c. Awaiting additional orders.

## 2023-09-15 NOTE — ED Notes (Signed)
 Notified provider Nanavati MD of patient request for pain medicine. Also that there is a small amount of bleeding now from the left nostril as well.

## 2023-09-15 NOTE — Discharge Instructions (Signed)
 Keep the packing in for another 24 to 48 hours, thereafter you can have it be removed at home.  For now - please do not blow your nose, or put fingers in your nose to clear up any clots.  Return to the ER if you start having heavy bleeding again.

## 2023-09-17 DIAGNOSIS — R040 Epistaxis: Secondary | ICD-10-CM | POA: Diagnosis not present

## 2023-09-17 DIAGNOSIS — Z7901 Long term (current) use of anticoagulants: Secondary | ICD-10-CM | POA: Diagnosis not present

## 2023-09-17 DIAGNOSIS — I4891 Unspecified atrial fibrillation: Secondary | ICD-10-CM | POA: Diagnosis not present

## 2023-09-17 DIAGNOSIS — J309 Allergic rhinitis, unspecified: Secondary | ICD-10-CM | POA: Diagnosis not present

## 2023-09-18 ENCOUNTER — Encounter (HOSPITAL_COMMUNITY): Payer: Self-pay

## 2023-09-18 ENCOUNTER — Other Ambulatory Visit: Payer: Self-pay

## 2023-09-18 ENCOUNTER — Emergency Department (HOSPITAL_COMMUNITY)
Admission: EM | Admit: 2023-09-18 | Discharge: 2023-09-18 | Disposition: A | Discharge status: Home / Self Care | Attending: Emergency Medicine | Admitting: Emergency Medicine

## 2023-09-18 DIAGNOSIS — R040 Epistaxis: Secondary | ICD-10-CM | POA: Diagnosis not present

## 2023-09-18 DIAGNOSIS — Z7901 Long term (current) use of anticoagulants: Secondary | ICD-10-CM | POA: Diagnosis not present

## 2023-09-18 NOTE — ED Provider Notes (Signed)
 [place] EMERGENCY DEPARTMENT AT [facility] Provider Note   CSN: [id number] Arrival date & time: 09/18/23  1453     Patient presents with: Packing Removal   Bridget Cortez is a 78 y.o. female.   The history is provided by the patient and medical records. No language interpreter was used.     78 year old female who has history of A-fib currently on Xarelto  presenting to have nasal packing removed.  5 days ago she has nosebleed and subsequently had a Rhino Rocket placed.  She does have an appointment with her ENT specialist Dr. Carlie tomorrow.  She was told that she should come back in 48 hours to have packing removed.  She is here to have her packing removed but states she is hesitant in having it removed because she is afraid that it may rebleed.  She has not noticed any active bleeding except for occasional small amount of blood when she spit out.  She has not resumed Xarelto  since the packing was placed.  No other complaint.  Prior to Admission medications   Medication Sig Start Date End Date Taking? Authorizing Provider  acetaminophen  (TYLENOL ) 500 MG tablet Take 1,000 mg by mouth every 6 (six) hours as needed (pain.).    [provider]  albuterol  (PROVENTIL ) (5 MG/ML) 0.5% nebulizer solution Take 0.5 mLs (2.5 mg total) by nebulization every 6 (six) hours as needed for wheezing or shortness of breath. 02/23/18   Curatolo, Adam, DO  amLODipine  (NORVASC ) 10 MG tablet Take 5 mg by mouth every evening.    [provider]  betamethasone  dipropionate 0.05 % cream Apply 1 Application topically 2 (two) times daily as needed (itching (dermatitis)).    [provider]  Calcium  Carbonate-Vitamin D  (CALTRATE 600+D PO) Take 1 tablet by mouth in the morning.    [provider]  cetirizine (ZYRTEC) 10 MG tablet Take 10 mg by mouth in the morning.    [provider]  Cyanocobalamin  (VITAMIN B-12 PO) Take 1 tablet by mouth daily with  lunch.    [provider]  esomeprazole (NEXIUM) 40 MG capsule Take 40 mg by mouth 2 (two) times daily before a meal. Before breakfast & before supper    [provider]  Ferrous Sulfate (IRON PO) Take 1 tablet by mouth daily.    [provider]  fluticasone  (FLONASE ) 50 MCG/ACT nasal spray Place 1 spray into both nostrils in the morning and at bedtime. 03/16/12   [provider]  Fluticasone -Salmeterol (ADVAIR) 250-50 MCG/DOSE AEPB Inhale 1 puff into the lungs in the morning and at bedtime.    [provider]  loperamide  (IMODIUM ) 2 MG capsule Take 1 capsule (2 mg total) by mouth 2 (two) times daily as needed for diarrhea or loose stools. 04/06/23   Christopher Savannah, [name]  Multiple Vitamin (MULTIVITAMIN) tablet Take 1 tablet by mouth in the morning.    [provider]  nebivolol  (BYSTOLIC ) 5 MG tablet Take 1 tablet by mouth once daily 11/28/22   Fernande Elspeth BROCKS, MD  oxyCODONE  (OXY IR/ROXICODONE ) 5 MG immediate release tablet Take 0.5-1 tablets (2.5-5 mg total) by mouth every 6 (six) hours as needed for severe pain. 08/08/22   Aron Shoulders, MD  potassium chloride  SA (KLOR-CON ) 20 MEQ tablet Take 20 mEq by mouth 3 (three) times daily.    [provider]  rivaroxaban  (XARELTO ) 20 MG TABS tablet Take 1 tablet (20 mg total) by mouth daily with supper. 06/04/23   McAlhany,  Lonni BIRCH, MD  rosuvastatin  (CRESTOR ) 5 MG tablet Take 5 mg by mouth every evening.    [provider]  simethicone  (MYLICON) 125 MG chewable tablet Chew 125 mg by mouth every 6 (six) hours as needed for flatulence.    [provider]  SSD 1 % cream Apply 1 Application topically 2 (two) times daily as needed (chafing). 07/25/22   [provider]  telmisartan (MICARDIS) 40 MG tablet Take 40 mg by mouth in the morning.    [provider]  torsemide  (DEMADEX ) 20 MG tablet Take 1 tablet (20 mg total) by mouth daily. 02/05/23   Fernande Elspeth BROCKS, MD   triamcinolone  cream (KENALOG) 0.1 % Apply 1 application  topically 2 (two) times daily as needed (itching.). 07/05/20   [provider]  vitamin C  (ASCORBIC ACID ) 500 MG tablet Take 500 mg by mouth in the morning.    [provider]    Allergies: Ephedrine  and Cortisone    Review of Systems  HENT:  Positive for nosebleeds.     Updated Vital Signs BP 124/87   Pulse 71   Temp 98.3 F (36.8 C)   Resp 18   Ht 5' 8 (1.727 m)   Wt 114.3 kg   SpO2 99%   BMI 38.32 kg/m   Physical Exam Vitals and nursing note reviewed.  Constitutional:      General: She is not in acute distress.    Appearance: She is well-developed.  HENT:     Head: Atraumatic.     Nose:     Comments: Rhino Rocket's placed in right nares, no active bleeding. Eyes:     Conjunctiva/sclera: Conjunctivae normal.  Pulmonary:     Effort: Pulmonary effort is normal.  Musculoskeletal:     Cervical back: Neck supple.  Skin:    Findings: No rash.  Neurological:     Mental Status: She is alert.  Psychiatric:        Mood and Affect: Mood normal.     (all labs ordered are listed, but only abnormal results are displayed) Labs Reviewed - No data to display  EKG: None  Radiology: No results found.   Procedures   Medications Ordered in the ED - No data to display                                  Medical Decision Making  BP 124/87   Pulse 71   Temp 98.3 F (36.8 C)   Resp 18   Ht 5' 8 (1.727 m)   Wt 114.3 kg   SpO2 99%   BMI 38.32 kg/m   36:52 PM  78 year old female who has history of A-fib currently on Xarelto  presenting to have nasal packing removed.  5 days ago she has nosebleed and subsequently had a Rhino Rocket placed.  She does have an appointment with her ENT specialist Dr. Carlie tomorrow.  She was told that she should come back in 48 hours to have packing removed.  She is here to have her packing removed but states she is hesitant in having it removed because she is  afraid that it may rebleed.  She has not noticed any active bleeding except for occasional small amount of blood when she spit out.  She has not resumed Xarelto  since the packing was placed.  No other complaint.  On exam patient still has the [employer] in her right naris.  No active bleeding.  I discussed option of removing it and if it bleeds then we will replace another one vs. keeping it in place and to be seen by ENT specialist as previously scheduled.  Her appointment is tomorrow morning.  After further discussion patient preference is to keep the [employer] in place until she sees her ENT tomorrow.  I recommend patient to resume her Xarelto  as she has been off of it for the past 4 days.  Gave patient return precaution.  Patient is stable for discharge.     Final diagnoses:  Anterior epistaxis    ED Discharge Orders     None          Nivia Colon, [name] 09/18/23 1543    Freddi Hamilton, MD 09/19/23 [phone]

## 2023-09-18 NOTE — ED Triage Notes (Signed)
 Patient had rhino rocket placed on Saturday, here to have it removed today. No pain reported at this time.

## 2023-09-18 NOTE — Discharge Instructions (Signed)
 Please resume your Xarelto  tonight and follow-up with ENT specialist tomorrow to have your packing removed and for reassessment.  Return if you have any concern.

## 2023-09-19 DIAGNOSIS — Z7901 Long term (current) use of anticoagulants: Secondary | ICD-10-CM | POA: Diagnosis not present

## 2023-09-19 DIAGNOSIS — R040 Epistaxis: Secondary | ICD-10-CM | POA: Diagnosis not present

## 2023-09-25 NOTE — Progress Notes (Unsigned)
 Cardiology Office Note:  .   Date:  09/25/2023  ID:  Bridget Cortez, DOB 1945/11/20, MRN [id number] PCP: Janey Santos, MD  [facility] Providers Cardiologist:  Lonni Cash, MD Electrophysiologist:  Elspeth Sage, MD {  History of Present Illness: .   Bridget Cortez is a 78 y.o. female w/PMHx of HTN, anxiety  WCT (features of VT (concordance//aVR) but initiation and termination sequences suggestive of SVT--GT agreed more likely SVT; subsequently, 5/22, identified with atrial flutter >> atrial fib) AFlutter/AFib  She saw Dr. Sage 10/04/22, frustrated with nighttime urination HFpEF, volume stable  She saw Jodie 04/02/23, holding on to some fluid though also drinking a lot of water , re[ported edema worse after knee surgery Appeared volume stable, discussed use of PRN dose of torsemide  (on top of scheduled dosing), councelled on fluid restriction   A number of ER visits for nose bleeding  Pending L TKA on 10/15/23  RCRI score is one (0.9%)  Today's visit is scheduled for reports of increase/unusual LLE swelling, ? Pre-op evaluation ROS:   *** volume, intake, PRN torsemide ? *** AF/flutter, burden *** Xarelto , dose, bleeding, labs *** needs new EP MD *** anything new?  Unusual *** DUKE *** xarelto  hold >> 2 days  *** update echo?   Studies Reviewed: SABRA    EKG done today and reviewed by myself:  ***  08/04/2017: TTE Study Conclusions  - Left ventricle: The cavity size was normal. Wall thickness was    increased in a pattern of mild LVH. Systolic function was normal.    The estimated ejection fraction was in the range of 55% to 60%.    Wall motion was normal; there were no regional wall motion    abnormalities. Indeterminate diastolic function.  - Aortic valve: Mildly calcified annulus. Trileaflet; mildly    calcified leaflets.  - Mitral valve: Mildly calcified annulus. There was mild    regurgitation.  - Left atrium: The atrium was at  the upper limits of normal in    size.  - Right atrium: The atrium was mildly dilated. Central venous    pressure (est): 3 mm Hg.  - Atrial septum: No defect or patent foramen ovale was identified.  - Tricuspid valve: There was mild regurgitation.  - Pulmonary arteries: PA peak pressure: 23 mm Hg (S).  - Pericardium, extracardiac: A small pericardial effusion was    identified posterior to the heart.    Risk Assessment/Calculations:    Physical Exam:   VS:  There were no vitals taken for this visit.   Wt Readings from Last 3 Encounters:  09/18/23 252 lb (114.3 kg)  09/15/23 248 lb (112.5 kg)  05/02/23 254 lb 14.4 oz (115.6 kg)    GEN: Well nourished, well developed in no acute distress NECK: No JVD; No carotid bruits CARDIAC: ***RRR, no murmurs, rubs, gallops RESPIRATORY:  *** CTA b/l without rales, wheezing or rhonchi  ABDOMEN: Soft, non-tender, non-distended EXTREMITIES: *** No edema; No deformity   ASSESSMENT AND PLAN: .    paroxysmal AFib AFlutter CHA2DS2Vasc is 4, on Xarelto , *** appropriately dosed *** burden by symptoms  HTN ***  HFpEF ***  Secondary hypercoagulable state 2/2 AFib  6.    Pre-op evaluation Knee surgery considered low cardiac risk surgery RCRI score is one (0.9%) *** Xarelto  ***     {Are you ordering a CV Procedure (e.g. stress test, cath, DCCV, TEE, etc)?   Press F2        :[id number]}  Dispo: ***  Signed, Charlies Macario Arthur, [name]

## 2023-09-26 ENCOUNTER — Inpatient Hospital Stay: Attending: Internal Medicine

## 2023-09-26 DIAGNOSIS — Z79899 Other long term (current) drug therapy: Secondary | ICD-10-CM | POA: Insufficient documentation

## 2023-09-26 DIAGNOSIS — I10 Essential (primary) hypertension: Secondary | ICD-10-CM | POA: Insufficient documentation

## 2023-09-26 DIAGNOSIS — R040 Epistaxis: Secondary | ICD-10-CM | POA: Insufficient documentation

## 2023-09-26 DIAGNOSIS — Z860101 Personal history of adenomatous and serrated colon polyps: Secondary | ICD-10-CM | POA: Insufficient documentation

## 2023-09-26 DIAGNOSIS — D72819 Decreased white blood cell count, unspecified: Secondary | ICD-10-CM | POA: Insufficient documentation

## 2023-09-26 DIAGNOSIS — Z87412 Personal history of vulvar dysplasia: Secondary | ICD-10-CM | POA: Diagnosis not present

## 2023-09-26 DIAGNOSIS — D649 Anemia, unspecified: Secondary | ICD-10-CM | POA: Insufficient documentation

## 2023-09-26 DIAGNOSIS — G935 Compression of brain: Secondary | ICD-10-CM | POA: Diagnosis not present

## 2023-09-26 DIAGNOSIS — D472 Monoclonal gammopathy: Secondary | ICD-10-CM | POA: Diagnosis not present

## 2023-09-26 DIAGNOSIS — Z853 Personal history of malignant neoplasm of breast: Secondary | ICD-10-CM | POA: Diagnosis not present

## 2023-09-26 DIAGNOSIS — Z8701 Personal history of pneumonia (recurrent): Secondary | ICD-10-CM | POA: Insufficient documentation

## 2023-09-26 DIAGNOSIS — Z7951 Long term (current) use of inhaled steroids: Secondary | ICD-10-CM | POA: Diagnosis not present

## 2023-09-26 DIAGNOSIS — J45909 Unspecified asthma, uncomplicated: Secondary | ICD-10-CM | POA: Insufficient documentation

## 2023-09-26 DIAGNOSIS — Z7901 Long term (current) use of anticoagulants: Secondary | ICD-10-CM | POA: Insufficient documentation

## 2023-09-26 DIAGNOSIS — R011 Cardiac murmur, unspecified: Secondary | ICD-10-CM | POA: Diagnosis not present

## 2023-09-26 DIAGNOSIS — Z923 Personal history of irradiation: Secondary | ICD-10-CM | POA: Insufficient documentation

## 2023-09-26 LAB — CBC WITH DIFFERENTIAL (CANCER CENTER ONLY)
Abs Immature Granulocytes: 0 K/uL (ref 0.00–0.07)
Basophils Absolute: 0 K/uL (ref 0.0–0.1)
Basophils Relative: 1 %
Eosinophils Absolute: 0.1 K/uL (ref 0.0–0.5)
Eosinophils Relative: 4 %
HCT: 33 % — ABNORMAL LOW (ref 36.0–46.0)
Hemoglobin: 10.5 g/dL — ABNORMAL LOW (ref 12.0–15.0)
Immature Granulocytes: 0 %
Lymphocytes Relative: 37 %
Lymphs Abs: 1.1 K/uL (ref 0.7–4.0)
MCH: 27.2 pg (ref 26.0–34.0)
MCHC: 31.8 g/dL (ref 30.0–36.0)
MCV: 85.5 fL (ref 80.0–100.0)
Monocytes Absolute: 0.3 K/uL (ref 0.1–1.0)
Monocytes Relative: 9 %
Neutro Abs: 1.5 K/uL — ABNORMAL LOW (ref 1.7–7.7)
Neutrophils Relative %: 49 %
Platelet Count: 237 K/uL (ref 150–400)
RBC: 3.86 MIL/uL — ABNORMAL LOW (ref 3.87–5.11)
RDW: 15.9 % — ABNORMAL HIGH (ref 11.5–15.5)
WBC Count: 3 K/uL — ABNORMAL LOW (ref 4.0–10.5)
nRBC: 0 % (ref 0.0–0.2)

## 2023-09-26 LAB — CMP (CANCER CENTER ONLY)
ALT: 12 U/L (ref 0–44)
AST: 20 U/L (ref 15–41)
Albumin: 3.8 g/dL (ref 3.5–5.0)
Alkaline Phosphatase: 88 U/L (ref 38–126)
Anion gap: 7 (ref 5–15)
BUN: 15 mg/dL (ref 8–23)
CO2: 27 mmol/L (ref 22–32)
Calcium: 9.3 mg/dL (ref 8.9–10.3)
Chloride: 107 mmol/L (ref 98–111)
Creatinine: 0.71 mg/dL (ref 0.44–1.00)
GFR, Estimated: >60 mL/min (ref >60)
Glucose, Bld: 88 mg/dL (ref 70–99)
Potassium: 4 mmol/L (ref 3.5–5.1)
Sodium: 141 mmol/L (ref 135–145)
Total Bilirubin: 0.4 mg/dL (ref 0.0–1.2)
Total Protein: 7.1 g/dL (ref 6.5–8.1)

## 2023-09-26 LAB — IRON AND IRON BINDING CAPACITY (CC-WL,HP ONLY)
Iron: 47 ug/dL (ref 28–170)
Saturation Ratios: 14 % (ref 10.4–31.8)
TIBC: 328 ug/dL (ref 250–450)
UIBC: 281 ug/dL (ref 148–442)

## 2023-09-26 LAB — FERRITIN: Ferritin: 43 ng/mL (ref 11–307)

## 2023-09-26 LAB — VITAMIN B12: Vitamin B-12: 1932 pg/mL — ABNORMAL HIGH (ref 180–914)

## 2023-09-27 ENCOUNTER — Ambulatory Visit: Attending: Physician Assistant | Admitting: Physician Assistant

## 2023-09-27 VITALS — BP 118/72 | HR 75 | Ht 67.0 in | Wt 254.2 lb

## 2023-09-27 DIAGNOSIS — I5032 Chronic diastolic (congestive) heart failure: Secondary | ICD-10-CM | POA: Insufficient documentation

## 2023-09-27 DIAGNOSIS — I480 Paroxysmal atrial fibrillation: Secondary | ICD-10-CM | POA: Insufficient documentation

## 2023-09-27 DIAGNOSIS — I10 Essential (primary) hypertension: Secondary | ICD-10-CM | POA: Insufficient documentation

## 2023-09-27 DIAGNOSIS — D6869 Other thrombophilia: Secondary | ICD-10-CM | POA: Diagnosis not present

## 2023-09-27 DIAGNOSIS — I4892 Unspecified atrial flutter: Secondary | ICD-10-CM | POA: Insufficient documentation

## 2023-09-27 LAB — KAPPA/LAMBDA LIGHT CHAINS
Kappa free light chain: 36.5 mg/L — ABNORMAL HIGH (ref 3.3–19.4)
Kappa, lambda light chain ratio: 1.44 (ref 0.26–1.65)
Lambda free light chains: 25.4 mg/L (ref 5.7–26.3)

## 2023-09-27 LAB — BETA 2 MICROGLOBULIN, SERUM: Beta-2 Microglobulin: 2.7 mg/L — ABNORMAL HIGH (ref 0.6–2.4)

## 2023-09-27 NOTE — Patient Instructions (Addendum)
 Medication Instructions:   Your physician recommends that you continue on your current medications as directed. Please refer to the Current Medication list given to you today.   *If you need a refill on your cardiac medications before your next appointment, please call your pharmacy*    Lab Work:  NONE ORDERED  TODAY     If you have labs (blood work) drawn today and your tests are completely normal, you will receive your results only by: MyChart Message (if you have MyChart) OR A paper copy in the mail If you have any lab test that is abnormal or we need to change your treatment, we will call you to review the results.    Testing/Procedures: Your physician has requested that you have an echocardiogram. Echocardiography is a painless test that uses sound waves to create images of your heart. It provides your doctor with information about the size and shape of your heart and how well your heart's chambers and valves are working. This procedure takes approximately one hour. There are no restrictions for this procedure. Please do NOT wear cologne, perfume, aftershave, or lotions (deodorant is allowed). Please arrive 15 minutes prior to your appointment time.  Please note: We ask at that you not bring children with you during ultrasound (echo/ vascular) testing. Due to room size and safety concerns, children are not allowed in the ultrasound rooms during exams. Our front office staff cannot provide observation of children in our lobby area while testing is being conducted. An adult accompanying a patient to their appointment will only be allowed in the ultrasound room at the discretion of the ultrasound technician under special circumstances. We apologize for any inconvenience.      Follow-Up: At [facility], you and your health needs are our priority.  As part of our continuing mission to provide you with exceptional heart care, our providers are all part of one team.  This team  includes your primary Cardiologist (physician) and Advanced Practice Providers or APPs (Physician Assistants and Nurse Practitioners) who all work together to provide you with the care you need, when you need it.    Your next appointment:   ASAP    Provider:    DR ALMETTA   ( CONTACT  CASSIE HALL/ ANGELINE HAMMER FOR EP SCHEDULING ISSUES )    We recommend signing up for the patient portal called MyChart.  Sign up information is provided on this After Visit Summary.  MyChart is used to connect with patients for Virtual Visits (Telemedicine).  Patients are able to view lab/test results, encounter notes, upcoming appointments, etc.  Non-urgent messages can be sent to your provider as well.   To learn more about what you can do with MyChart, go to [email or web address].   Other Instructions

## 2023-10-01 LAB — MULTIPLE MYELOMA PANEL, SERUM
Albumin SerPl Elph-Mcnc: 3.6 g/dL (ref 2.9–4.4)
Albumin/Glob SerPl: 1.2 (ref 0.7–1.7)
Alpha 1: 0.2 g/dL (ref 0.0–0.4)
Alpha2 Glob SerPl Elph-Mcnc: 0.7 g/dL (ref 0.4–1.0)
B-Globulin SerPl Elph-Mcnc: 1.1 g/dL (ref 0.7–1.3)
Gamma Glob SerPl Elph-Mcnc: 1.1 g/dL (ref 0.4–1.8)
Globulin, Total: 3.1 g/dL (ref 2.2–3.9)
IgA: 311 mg/dL (ref 64–422)
IgG (Immunoglobin G), Serum: 1288 mg/dL (ref 586–1602)
IgM (Immunoglobulin M), Srm: 73 mg/dL (ref 26–217)
M Protein SerPl Elph-Mcnc: 0.3 g/dL — ABNORMAL HIGH
Total Protein ELP: 6.7 g/dL (ref 6.0–8.5)

## 2023-10-03 ENCOUNTER — Inpatient Hospital Stay (HOSPITAL_BASED_OUTPATIENT_CLINIC_OR_DEPARTMENT_OTHER): Admitting: Internal Medicine

## 2023-10-03 ENCOUNTER — Encounter (HOSPITAL_COMMUNITY)

## 2023-10-03 VITALS — BP 145/78 | Temp 97.3°F | Resp 17 | Ht 67.0 in | Wt 253.7 lb

## 2023-10-03 DIAGNOSIS — D539 Nutritional anemia, unspecified: Secondary | ICD-10-CM | POA: Diagnosis not present

## 2023-10-03 DIAGNOSIS — D72819 Decreased white blood cell count, unspecified: Secondary | ICD-10-CM | POA: Diagnosis not present

## 2023-10-03 DIAGNOSIS — Z23 Encounter for immunization: Secondary | ICD-10-CM | POA: Diagnosis not present

## 2023-10-03 DIAGNOSIS — J45909 Unspecified asthma, uncomplicated: Secondary | ICD-10-CM | POA: Diagnosis not present

## 2023-10-03 DIAGNOSIS — R011 Cardiac murmur, unspecified: Secondary | ICD-10-CM | POA: Diagnosis not present

## 2023-10-03 DIAGNOSIS — D472 Monoclonal gammopathy: Secondary | ICD-10-CM | POA: Diagnosis not present

## 2023-10-03 DIAGNOSIS — D649 Anemia, unspecified: Secondary | ICD-10-CM | POA: Diagnosis not present

## 2023-10-03 DIAGNOSIS — R040 Epistaxis: Secondary | ICD-10-CM | POA: Diagnosis not present

## 2023-10-03 NOTE — Progress Notes (Signed)
 [facility] [phone]   [phone]  OFFICE PROGRESS NOTE  Bridget Santos, MD [address] [employer] [place] [address]  DIAGNOSIS:  Leukocytopenia and mild anemia.  Her leukocytopenia is likely drug-induced from her current treatment rosuvastatin .  She also has mild anemia of chronic disease.    PRIOR THERAPY:None  CURRENT THERAPY: None  INTERVAL HISTORY: Bridget Cortez 78 y.o. female returns to the clinic today for 3-month follow-up visit.  Discussed the use of AI scribe software for clinical note transcription with the patient, who gave verbal consent to proceed.  History of Present Illness Bridget Cortez is a 78 year old female with leukocytopenia, mild anemia, and MGUS who presents for evaluation and repeat blood work.  She experienced recurrent epistaxis starting on August 23rd. Initial management at a walk-in clinic with gauze and spray was unsuccessful, leading to a referral to the emergency room where cauterization was attempted. Despite this, the bleeding recurred, necessitating further treatment at another facility where a nasal balloon was used. The bleeding persisted until a larger balloon was placed, successfully stopping the bleeding. An ENT specialist later removed the balloon without further bleeding.  She is currently on Xarelto  and experienced anxiety about continuing the medication due to the epistaxis. She temporarily stopped taking it for three to four days but resumed on the advice of her physician to prevent stroke risk. She is scheduled for an echocardiogram on October 7th.  Her left knee surgery, initially scheduled for September 22nd, was postponed to November 10th due to the epistaxis. She reports pain in the knee, especially with movement, and expresses concern about the delay in surgery.  She has a history of anemia of chronic disease, with a current hemoglobin level of 10.5, which is lower than her  previous levels of 11.3 in October last year and 10.6 in April. She is taking iron supplements twice a week, which has maintained her iron levels. Her white blood cell count is 3.0, and her platelets are within normal range.  She has MGUS with an M protein level of 0.3, which is being monitored. She inquires about dietary changes to improve this condition but is informed that there are no specific dietary interventions.  She has a history of breast cancer treated in 1999 with radiation, and she expresses concern about its potential impact on her current health. She also mentions having asthma and a heart murmur. No recent bleeding since the last episode on August 25th.   MEDICAL HISTORY: Past Medical History:  Diagnosis Date   Abnormal Pap smear 2006   vaginal medicine according to patient   Allergy    Arthritis    Asthma    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 eye    cataract removed.   Chiari malformation    Diverticulosis    Dysrhythmia    SVT   GERD (gastroesophageal reflux disease)    H/O osteopenia    08/2006   H/O varicella    Heart murmur    Hypertension    Monilial vaginitis 07/2007   Multiple allergies    Ovarian cyst    Personal history of colonic adenomas 03/13/2012   Personal history of radiation therapy 1990   Pneumonia 07/2014   VIN III (vulvar intraepithelial neoplasia III) 03/2009   Yeast infection     ALLERGIES:  is  allergic to ephedrine  and cortisone.  MEDICATIONS:  Current Outpatient Medications  Medication Sig Dispense Refill   acetaminophen  (TYLENOL ) 500 MG tablet Take 1,000 mg by mouth every 6 (six) hours as needed (pain.).     albuterol  (PROVENTIL ) (5 MG/ML) 0.5% nebulizer solution Take 0.5 mLs (2.5 mg total) by nebulization every 6 (six) hours as needed for wheezing or shortness of breath. 20 mL 12   amLODipine  (NORVASC ) 10 MG tablet Take 5 mg by mouth  every evening.     betamethasone  dipropionate 0.05 % cream Apply 1 Application topically 2 (two) times daily as needed (itching (dermatitis)).     Calcium  Carbonate-Vitamin D  (CALTRATE 600+D PO) Take 1 tablet by mouth in the morning.     cetirizine (ZYRTEC) 10 MG tablet Take 10 mg by mouth in the morning.     Cyanocobalamin  (VITAMIN B-12 PO) Take 1 tablet by mouth daily with lunch.     esomeprazole (NEXIUM) 40 MG capsule Take 40 mg by mouth 2 (two) times daily before a meal. Before breakfast & before supper     Ferrous Sulfate (IRON PO) Take 1 tablet by mouth daily.     fluticasone  (FLONASE ) 50 MCG/ACT nasal spray Place 1 spray into both nostrils in the morning and at bedtime.     Fluticasone -Salmeterol (ADVAIR) 250-50 MCG/DOSE AEPB Inhale 1 puff into the lungs in the morning and at bedtime.     loperamide  (IMODIUM ) 2 MG capsule Take 1 capsule (2 mg total) by mouth 2 (two) times daily as needed for diarrhea or loose stools. 14 capsule 0   Multiple Vitamin (MULTIVITAMIN) tablet Take 1 tablet by mouth in the morning.     nebivolol  (BYSTOLIC ) 5 MG tablet Take 1 tablet by mouth once daily 90 tablet 2   oxyCODONE  (OXY IR/ROXICODONE ) 5 MG immediate release tablet Take 0.5-1 tablets (2.5-5 mg total) by mouth every 6 (six) hours as needed for severe pain. 15 tablet 0   potassium chloride  SA (KLOR-CON ) 20 MEQ tablet Take 20 mEq by mouth 3 (three) times daily.     rivaroxaban  (XARELTO ) 20 MG TABS tablet Take 1 tablet (20 mg total) by mouth daily with supper. 90 tablet 1   rosuvastatin  (CRESTOR ) 5 MG tablet Take 5 mg by mouth every evening.     simethicone  (MYLICON) 125 MG chewable tablet Chew 125 mg by mouth every 6 (six) hours as needed for flatulence.     SSD 1 % cream Apply 1 Application topically 2 (two) times daily as needed (chafing).     telmisartan (MICARDIS) 40 MG tablet Take 40 mg by mouth in the morning.     torsemide  (DEMADEX ) 20 MG tablet Take 1 tablet (20 mg total) by mouth daily. 90 tablet 2    triamcinolone  cream (KENALOG) 0.1 % Apply 1 application  topically 2 (two) times daily as needed (itching.).     vitamin C  (ASCORBIC ACID ) 500 MG tablet Take 500 mg by mouth in the morning.     No current facility-administered medications for this visit.    SURGICAL HISTORY:  Past Surgical History:  Procedure Laterality Date   ABDOMINAL HYSTERECTOMY     ABDOMINAL SURGERY     BREAST DUCTAL SYSTEM EXCISION Right 08/08/2022   Procedure: RIGHT BREAST CENTRAL DUCT EXCISION;  Surgeon: Aron Shoulders, MD;  Location: [facility] OR;  Service: General;  Laterality: Right;   BREAST EXCISIONAL BIOPSY Right    benign   BREAST LUMPECTOMY Left 1990   BREAST SURGERY     Lumpectomy,  left breast   CARDIOVERSION N/A 07/28/2020   Procedure: CARDIOVERSION;  Surgeon: Kate Lonni CROME, MD;  Location: [facility] ENDOSCOPY;  Service: Cardiovascular;  Laterality: N/A;   CATARACT EXTRACTION Left 2018   COLONOSCOPY     COLONOSCOPY W/ BIOPSIES     EYE SURGERY     HERNIA REPAIR     KNEE SURGERY     right   lt. eye aneurysm surgery Left 2017   MOUTH SURGERY  2019   rt. side gum surgery  Lt. done 3 years ago   RADIOACTIVE SEED GUIDED EXCISIONAL BREAST BIOPSY Right 07/09/2018   Procedure: RADIOACTIVE SEED GUIDED EXCISIONAL RIGHT  BREAST BIOPSY;  Surgeon: Aron Shoulders, MD;  Location: [facility] OR;  Service: General;  Laterality: Right;   TOTAL KNEE ARTHROPLASTY Right 09/09/2019   Procedure: RIGHT TOTAL KNEE ARTHROPLASTY;  Surgeon: Sheril Coy, MD;  Location: [facility] ORS;  Service: Orthopedics;  Laterality: Right;    REVIEW OF SYSTEMS:  Constitutional: positive for fatigue Eyes: negative Ears, nose, mouth, throat, and face: negative Respiratory: negative Cardiovascular: negative Gastrointestinal: negative Genitourinary:negative Integument/breast: negative Hematologic/lymphatic: negative Musculoskeletal:positive for arthralgias Neurological: negative Behavioral/Psych: negative Endocrine: negative Allergic/Immunologic:  negative   PHYSICAL EXAMINATION: General appearance: alert, cooperative, fatigued, and no distress Head: Normocephalic, without obvious abnormality, atraumatic Neck: no adenopathy, no JVD, supple, symmetrical, trachea midline, and thyroid  not enlarged, symmetric, no tenderness/mass/nodules Lymph nodes: Cervical, supraclavicular, and axillary nodes normal. Resp: clear to auscultation bilaterally Back: symmetric, no curvature. ROM normal. No CVA tenderness. Cardio: regular rate and rhythm, S1, S2 normal, no murmur, click, rub or gallop GI: soft, non-tender; bowel sounds normal; no masses,  no organomegaly Extremities: extremities normal, atraumatic, no cyanosis or edema Neurologic: Alert and oriented X 3, normal strength and tone. Normal symmetric reflexes. Normal coordination and gait  ECOG PERFORMANCE STATUS: 1 - Symptomatic but completely ambulatory  Blood pressure (!) 145/78, temperature (!) 97.3 F (36.3 C), resp. rate 17, height 5' 7 (1.702 m), weight 253 lb 11.2 oz (115.1 kg).  LABORATORY DATA: Lab Results  Component Value Date   WBC 3.0 (L) 09/26/2023   HGB 10.5 (L) 09/26/2023   HCT 33.0 (L) 09/26/2023   MCV 85.5 09/26/2023   PLT 237 09/26/2023      Chemistry      Component Value Date/Time   NA 141 09/26/2023 1049   NA 140 03/30/2022 1145   K 4.0 09/26/2023 1049   CL 107 09/26/2023 1049   CO2 27 09/26/2023 1049   BUN 15 09/26/2023 1049   BUN 15 03/30/2022 1145   CREATININE 0.71 09/26/2023 1049      Component Value Date/Time   CALCIUM  9.3 09/26/2023 1049   ALKPHOS 88 09/26/2023 1049   AST 20 09/26/2023 1049   ALT 12 09/26/2023 1049   BILITOT 0.4 09/26/2023 1049       RADIOGRAPHIC STUDIES: No results found.  ASSESSMENT AND PLAN: This is a very pleasant 78 years old African-American female with persistent leukocytopenia and anemia of unclear etiology.  It was initially thought to be drug-induced but there is no improvement in her condition after  discontinuation of the suspicious culprit medication.. The patient underwent a bone marrow biopsy and aspirate.  Her biopsy showed no concerning bone marrow abnormality except for a slight increase of plasma cells but representing 4%. Assessment and Plan Assessment & Plan Leukopenia and anemia of chronic disease Chronic leukopenia and anemia of chronic disease with hemoglobin at 10.5 g/dL, consistent with her baseline. White blood cell count is 3.0. Platelet  count and iron studies are stable. Anemia is likely multifactorial, possibly related to chronic medical condition and MGUS as well as blood loss from anticoagulation. - Continue iron supplementation on Wednesdays and Fridays - Reassess blood counts in six months  Monoclonal gammopathy of undetermined significance (MGUS) MGUS with an M protein spike of 0.3 g/dL. Small risk of progression to multiple myeloma, approximately 1% per year, with no current signs of progression. - Reassess MGUS status in six months  Epistaxis, resolved, on anticoagulation therapy Recent episodes of epistaxis likely related to anticoagulation therapy with Xarelto . Bleeding resolved following ENT intervention and temporary cessation of Xarelto . Xarelto  resumed to prevent thromboembolic events, as advised by her primary care physician. - Continue Xarelto  as prescribed - Follow up with ENT as scheduled The patient was advised to call immediately if she has any other concerning symptoms in the interval.  The patient voices understanding of current disease status and treatment options and is in agreement with the current care plan. The total time spent in the appointment was 30 minutes including review of chart and various tests results, discussions about plan of care and coordination of care plan .  All questions were answered. The patient knows to call the clinic with any problems, questions or concerns. We can certainly see the patient much sooner if  necessary.   Disclaimer: This note was dictated with voice recognition software. Similar sounding words can inadvertently be transcribed and may not be corrected upon review.

## 2023-10-04 ENCOUNTER — Ambulatory Visit: Admitting: Student

## 2023-10-04 DIAGNOSIS — Z7901 Long term (current) use of anticoagulants: Secondary | ICD-10-CM | POA: Diagnosis not present

## 2023-10-04 DIAGNOSIS — R040 Epistaxis: Secondary | ICD-10-CM | POA: Diagnosis not present

## 2023-10-04 NOTE — Progress Notes (Signed)
 History of Present Illness The patient is a 78 year old female who presents for a 2-week follow-up related to nosebleed.  She reports an improvement in her condition, with no recent episodes of nosebleeds. She has been cautious about blowing her nose and has not experienced any bleeding since the last visit. She had been using Flonase  twice daily as prescribed but is curious about the possibility of discontinuing it due to concerns about nosebleeds.   The patient has been on Xarelto  and has an upcoming echocardiogram to determine if she can discontinue the medication.  Physical Exam Nose: Nasal mucosa shows signs of healing from previous packing in right side, no active bleeding observed.  Assessment & Plan 1. Epistaxis and anti-coagulation use. The condition has improved significantly, with no recent episodes of nosebleeds. The risk of recurrent epistaxis is decreasing over time but remains higher than average due to anticoagulant therapy with Xarelto . Discontinuation of Flonase  was advised due to its potential side effect of causing nosebleeds. The use of saline spray was recommended for nasal hygiene. Gentle nose blowing was encouraged. The best outcome would be if the cardiologist determines that Xarelto  can be discontinued. An echocardiogram will be performed to assess the necessity of continuing Xarelto .  Results

## 2023-10-10 DIAGNOSIS — G935 Compression of brain: Secondary | ICD-10-CM | POA: Diagnosis not present

## 2023-10-19 ENCOUNTER — Encounter: Payer: Self-pay | Admitting: Student in an Organized Health Care Education/Training Program

## 2023-10-19 ENCOUNTER — Ambulatory Visit
Attending: Student in an Organized Health Care Education/Training Program | Admitting: Student in an Organized Health Care Education/Training Program

## 2023-10-19 VITALS — BP 120/80 | HR 82 | Ht 68.0 in | Wt 253.0 lb

## 2023-10-19 DIAGNOSIS — I480 Paroxysmal atrial fibrillation: Secondary | ICD-10-CM | POA: Diagnosis not present

## 2023-10-19 NOTE — Patient Instructions (Signed)

## 2023-10-19 NOTE — Progress Notes (Signed)
 Cardiology Office Note   Date:  10/19/2023  ID:  Zeppelin, Beckstrand 1945-06-05, MRN [id number] PCP: Janey Santos, MD  [facility] Providers Cardiologist:  Lonni Cash, MD Electrophysiologist:  Donnice DELENA Primus, MD  (prior Dr. Fernande)   History of Present Illness Bridget Cortez is a 78 y.o. female with SVT, paroxysmal atrial fibrillation/atrial flutter HTN, HFpEF, and anxiety who presents for for follow-up and management of atrial arrhythmias.  She was last seen by Charlies Arthur on 09/27/2023 for left lower extremity swelling and preop evaluation prior to possible surgery in October.  Her lower extremity swelling seems most consistent with HFpEF.  She has been on Eliquis  for anticoagulation however felt poorly on that and reported feeling cold all the time so she was switched to Xarelto  and overall is doing well however had recurrent epistaxis.  She is started back on Xarelto  on 08/26 however had bleeding shortly thereafter.  She saw Dr. Carlie on 08/27 who saw her on 09/11.  She has not had any recurrent episodes of epistaxis since her last visit with them.  He had previously had to pack the right side of her nose for epistaxis related to Xarelto .  She has not had any symptomatic arrhythmias in over 3 years so there was thought about ILR implantation to monitor for arrhythmia recurrence to help guide anticoagulation management with recurrent epistaxis underwent to get off of OAC.  She is scheduled for repeat echocardiogram on 10/30/2023.  Bridget Cortez is here with her husband.  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  She was a Insurance underwriter.  She retired in 2003 and her husband retired in 2001 from working with [employer].  Her husband has 1 daughter who unfortunately passed away from heart failure in 2019.  They have 1 daughter together who lives locally with 3 children, 2  boys and 1 girl.  The boys were at [employer] with one working at [employer] interested in Counsellor.  The other works for [employer] part-time and wants to do engineering get a ENT.  This 56 year old granddaughter is at [employer] school.  Since her recent evaluation she has had no more epistaxis and continues to tolerate Xarelto  without issue.  She has not had any recurrent symptomatic arrhythmias.  ROS: epistaxis   Studies Reviewed  ECG review 09/27/23: NSR 75, PR 198, QRS 102, QT/c 390/435, PACs 04/02/23: NSR 80, PR 204, QRS 100, QT/c 390/449, PACs 10/04/23: NSR 72, PR 190, QRS 102, QT/c 408/446 12/14/21: NSR 77, PR 194, QRS 110, QT/c 409/463, PACs 07/14/20: AF/RVR 103, QRS 100, QT/c 310/406 09/09/19: NSR 89, PR 200, QRS 106, QT/c 376/457, isolated PVC, PACs, AT  TTE Result date: 08/04/17 - Left ventricle: The cavity size was normal. Wall thickness was increased in a pattern of mild LVH. Systolic function was normal. The estimated ejection fraction was in the range of 55% to 60%. Wall motion was normal; there were no regional wall motion abnormalities. Indeterminate diastolic function. - Aortic valve: Mildly calcified annulus. Trileaflet; mildly calcified leaflets. - Mitral valve: Mildly calcified annulus. There was mild regurgitation. - Left atrium: The atrium was at the upper limits of normal in size. - Right atrium: The atrium was mildly dilated. Central venous pressure (est): 3 mm Hg. - Atrial septum: No defect or patent foramen ovale was identified. - Tricuspid valve: There was mild regurgitation. - Pulmonary arteries: PA peak pressure: 23 mm Hg (S). -  Pericardium, extracardiac: A small pericardial effusion was identified posterior to the heart.  Risk Assessment/Calculations  CHA2DS2-VASc Score = 5  This indicates a 7.2% annual risk of stroke. The patient's score is based upon: CHF History: 1 HTN History: 1 Diabetes History: 0 Stroke History: 0 Vascular Disease History: 0 Age Score:  2 Gender Score: 1   Physical Exam VS:  BP 120/80   Pulse 82   Ht 5' 8 (1.727 m)   Wt 253 lb (114.8 kg)   SpO2 98%   BMI 38.47 kg/m       Wt Readings from Last 3 Encounters:  10/19/23 253 lb (114.8 kg)  10/03/23 253 lb 11.2 oz (115.1 kg)  09/27/23 254 lb 3.2 oz (115.3 kg)    GEN: Well nourished, well developed in no acute distress HEENT: R lower molar with prior filling and discoloration/possible cavity  CARDIAC: RRR, 2/6 systolic murmur at apex, rubs, gallops RESPIRATORY:  Clear to auscultation without rales, wheezing or rhonchi  ABDOMEN: Soft, non-tender, non-distended EXTREMITIES:  No edema; No deformity   ASSESSMENT AND PLAN Bridget Cortez is a 78 y.o. female with SVT, paroxysmal atrial fibrillation/atrial flutter HTN, HFpEF, and anxiety who presents for for follow-up and management of atrial arrhythmias.  SVT Paroxysmal AF Paroxysmal AFL  Epistaxis  We had a long discussion today about different options for management of her atrial arrhythmias.  She has not had any documented atrial tachycardias since 2022 when she had wide-complex tachycardia that was thought to be consistent with AF/AFL.  She has been on Xarelto  for several years and up to this point as tolerated it without significant issues.  She had epistaxis at the end of August requiring several visits to the emergency department, silver  nitrate cautery, packing, and a Rhino Rocket.  She was seen by ENT and the Rhino Rocket was removed without ongoing issues.  We discussed that with a CHA2DS2-VASc of 5 she has a moderately high risk of stroke related to her previous atrial arrhythmias.  With no recurrent arrhythmias and no clear AF/AFL on any recent ECGs I would not commit her to LAAO at this point.  We could consider ILR for ongoing monitoring and discontinuation of OAC with understanding that there is still risk of stroke even without arrhythmias documented on monitoring.  I would not feel comfortable with only  short-term monitoring as a reason for discontinuation of anticoagulation since she seems to have very infrequent episodes as it is.  She is concerned about her upcoming knee surgery in November and about a possible cavity in her mouth that may delay the surgery.  I explained that she can hold OAC as needed around any procedures.  For today she wants to make no changes but if she has recurrent episodes of bleeding we had discussed proceeding with ILR implant at that time and discontinuation of anticoagulation.  I explained that if she has recurrent episodes of epistaxis she should hold her blood thinner until he resolved in the future and let us  know.  Dispo: f/u 6 months   A total of 55 minutes was spent preparing for the patient, reviewing history, performing exam, document encounter, coordinating care and counseling the patient. 45 minutes was spent with direct patient care.   Signed, Donnice DELENA Primus, MD

## 2023-10-22 DIAGNOSIS — L304 Erythema intertrigo: Secondary | ICD-10-CM | POA: Diagnosis not present

## 2023-10-22 DIAGNOSIS — L821 Other seborrheic keratosis: Secondary | ICD-10-CM | POA: Diagnosis not present

## 2023-10-29 ENCOUNTER — Other Ambulatory Visit

## 2023-10-30 ENCOUNTER — Ambulatory Visit (HOSPITAL_COMMUNITY)
Admission: RE | Admit: 2023-10-30 | Discharge: 2023-10-30 | Disposition: A | Discharge status: Home / Self Care | Source: Ambulatory Visit | Attending: Student in an Organized Health Care Education/Training Program | Admitting: Student in an Organized Health Care Education/Training Program

## 2023-10-30 DIAGNOSIS — I5032 Chronic diastolic (congestive) heart failure: Secondary | ICD-10-CM | POA: Diagnosis not present

## 2023-10-30 LAB — ECHOCARDIOGRAM COMPLETE
AR max vel: 2.36 cm2
AV Area VTI: 2.42 cm2
AV Area mean vel: 2.15 cm2
AV Mean grad: 9 mmHg
AV Peak grad: 16.5 mmHg
Ao pk vel: 2.03 m/s
Area-P 1/2: 4.36 cm2
MV M vel: 5.12 m/s
MV Peak grad: 104.9 mmHg
S' Lateral: 3.1 cm

## 2023-10-31 ENCOUNTER — Ambulatory Visit: Payer: Self-pay | Admitting: Physician Assistant

## 2023-11-05 ENCOUNTER — Ambulatory Visit: Admitting: Internal Medicine

## 2023-11-07 DIAGNOSIS — H40023 Open angle with borderline findings, high risk, bilateral: Secondary | ICD-10-CM | POA: Diagnosis not present

## 2023-11-09 DIAGNOSIS — Z7901 Long term (current) use of anticoagulants: Secondary | ICD-10-CM | POA: Diagnosis not present

## 2023-11-09 DIAGNOSIS — R040 Epistaxis: Secondary | ICD-10-CM | POA: Diagnosis not present

## 2023-11-12 ENCOUNTER — Telehealth: Payer: Self-pay | Admitting: Student in an Organized Health Care Education/Training Program

## 2023-11-12 NOTE — Telephone Encounter (Signed)
    Where are you bleeding? Nose    When did the bleeding start? Wednesday night se saw a spot of blood and Friday morning  - right nostril was bleeding    Has the bleeding stopped? Is it continuing? It stopped    Do you take blood thinners? Yes, xarelto      She went to Dr. Ilona office and she saw Dr. Gerard Shope and she able to stop the bleeding at 4 PM. She was advised to stop taking Xarelto  in 3 days and has not taken it since Friday. She has also had no bleeding since Friday. That's the only instructions she got from Dr. Shope and she doesn't know when she can start taking xarelto  again.

## 2023-11-12 NOTE — Telephone Encounter (Signed)
 Patient reports that she had another nose bleed on Friday. She went to her ENT and they were able to stop the bleeding after a few hours. She states that they told her to hold her xarelto  for several days. She reports that she has not had any further bleeding since. Advised that she could go ahead and restart her xarelto . I asked patient about her interest in getting a loop recorder that was discussed with her at her visit with Dr. Almetta. Patient reports that she might be interested but needs further information. She would like to come in to discuss with him further.

## 2023-11-13 NOTE — Telephone Encounter (Signed)
 Spoke w/ patient regarding scheduling an appointment with Dr. Almetta to talk more about the loop recorder and possible implant.   Patient states she doesn't know what to do because she is supposed to be having surgery 11/10 on her left knee - with the bleeding (even though it has stopped) she wants Dr. Almetta to let her know what he thinks about having the surgery, recommended or not. She states she will see a nose specialist next Monday.   She states she had an echo recently and the tech states her heart was strong.  She hasn't had any friends/family to have a loop recorder, she can't read a book about it, etc. Would like to know if there is another way to tell if she has af like a hear monitor or could she try a different blood thinner. Please advise.   She expressed that she doesn't want an appt just about the loop recorder, but rather she wants an appt just to check on her heart and see how she is doing. Patient is scheduled to see Dr. Almetta on 3/24.

## 2023-11-14 ENCOUNTER — Telehealth (HOSPITAL_BASED_OUTPATIENT_CLINIC_OR_DEPARTMENT_OTHER): Payer: Self-pay

## 2023-11-14 NOTE — Telephone Encounter (Signed)
   Pre-operative Risk Assessment    Patient Name: Bridget Cortez  DOB: [birth year] MRN: [id number]   Date of last office visit: 10/19/23 with Dr. Almetta  Date of next office visit: 04/15/24 with Dr. Almetta   Request for Surgical Clearance    Procedure:  Left total knee arthroplasty   Date of Surgery:  Clearance 12/03/23                                 Surgeon:  Dr. Dempsey Moan Surgeon's Group or Practice Name:  [facility] Phone number:  [phone] Attn: Kirke Amel Fax number:  [phone]   Type of Clearance Requested:   - Medical  - Pharmacy:  Hold Rivaroxaban  (Xarelto ) not indicated   Type of Anesthesia:  choice   Additional requests/questions:    Bonney Augustin JONETTA Delores   11/14/2023, 4:00 PM

## 2023-11-14 NOTE — Telephone Encounter (Signed)
 Dr. Almetta,  You saw this patient on 10/19/2023. Per protocol we request that you comment on his cardiac risk to proceed with Left total knee arthroplasty on 12/03/2023.  I have reached out to pharmacy concerning Xarelto  hold.  Please comment, since it has been less than 2 months since evaluated in the office. Please send your comment to P CV Pre-Op Pool.  Thank you, [name] DNP, ANP, AACC.

## 2023-11-14 NOTE — Telephone Encounter (Signed)
 Pharmacy please advise on holding Xarelto  prior to Left total knee arthroplasty  scheduled for 12/03/2023. Last labs 10/03/2023. Thank you.

## 2023-11-15 NOTE — Patient Instructions (Addendum)
 SURGICAL WAITING ROOM VISITATION  Patients having surgery or a procedure may have no more than 2 support people in the waiting area - these visitors may rotate.    Children under the age of 32 must have an adult with them who is not the patient.  Visitors with respiratory illnesses are discouraged from visiting and should remain at home.  If the patient needs to stay at the hospital during part of their recovery, the visitor guidelines for inpatient rooms apply. Pre-op nurse will coordinate an appropriate time for 1 support person to accompany patient in pre-op.  This support person may not rotate.    Please refer to the [facility] website for the visitor guidelines for Inpatients (after your surgery is over and you are in a regular room).       Your procedure is scheduled on:  12/03/2023    Report to [facility] Main Entrance    Report to admitting at    1015 AM   Call this number if you have problems the morning of surgery [phone]   Do not eat food :After Midnight.   After Midnight you may have the following liquids until _ 0945_____ AM DAY OF SURGERY  Water  Non-Citrus Juices (without pulp, NO RED-Apple, White grape, White cranberry) Black Coffee (NO MILK/CREAM OR CREAMERS, sugar ok)  Clear Tea (NO MILK/CREAM OR CREAMERS, sugar ok) regular and decaf                             Plain Jell-O (NO RED)                                           Fruit ices (not with fruit pulp, NO RED)                                     Popsicles (NO RED)                                                               Sports drinks like Gatorade (NO RED)              Drink 2 Ensure/G2 drinks AT 10:00 PM the night before surgery.        The day of surgery:  Drink ONE (1) Pre-Surgery Clear Ensure or G2 at  0945 AM the morning of surgery. Drink in one sitting. Do not sip.  This drink was given to you during your hospital  pre-op appointment visit. Nothing else to drink after  completing the  Pre-Surgery Clear Ensure or G2.          If you have questions, please contact your surgeon's office.       Oral Hygiene is also important to reduce your risk of infection.                                    Remember - BRUSH YOUR TEETH THE MORNING OF SURGERY WITH YOUR REGULAR TOOTHPASTE  DENTURES WILL BE REMOVED PRIOR TO SURGERY PLEASE DO NOT APPLY Poly grip OR ADHESIVES!!!   Do NOT smoke after Midnight   Stop all vitamins and herbal supplements 7 days before surgery.   Take these medicines the morning of surgery with A SIP OF WATER :  nebulizer if needed, zyrtec, nexium, nebivolol    DO NOT TAKE ANY ORAL DIABETIC MEDICATIONS DAY OF YOUR SURGERY  Bring CPAP mask and tubing day of surgery.                              You may not have any metal on your body including hair pins, jewelry, and body piercing             Do not wear make-up, lotions, powders, perfumes/cologne, or deodorant  Do not wear nail polish including gel and S&S, artificial/acrylic nails, or any other type of covering on natural nails including finger and toenails. If you have artificial nails, gel coating, etc. that needs to be removed by a nail salon please have this removed prior to surgery or surgery may need to be canceled/ delayed if the surgeon/ anesthesia feels like they are unable to be safely monitored.   Do not shave  48 hours prior to surgery.               Men may shave face and neck.   Do not bring valuables to the hospital. [facility] IS NOT             RESPONSIBLE   FOR VALUABLES.   Contacts, glasses, dentures or bridgework may not be worn into surgery.   Bring small overnight bag day of surgery.   DO NOT BRING YOUR HOME MEDICATIONS TO THE HOSPITAL. PHARMACY WILL DISPENSE MEDICATIONS LISTED ON YOUR MEDICATION LIST TO YOU DURING YOUR ADMISSION IN THE HOSPITAL!    Patients discharged on the day of surgery will not be allowed to drive home.  Someone NEEDS to stay with you for  the first 24 hours after anesthesia.   Special Instructions: Bring a copy of your healthcare power of attorney and living will documents the day of surgery if you haven't scanned them before.              Please read over the following fact sheets you were given: IF YOU HAVE QUESTIONS ABOUT YOUR PRE-OP INSTRUCTIONS PLEASE CALL [phone].   If you received a COVID test during your pre-op visit  it is requested that you wear a mask when out in public, stay away from anyone that may not be feeling well and notify your surgeon if you develop symptoms. If you test positive for Covid or have been in contact with anyone that has tested positive in the last 10 days please notify you surgeon.      Pre-operative 4 CHG Bath Instructions   You can play a key role in reducing the risk of infection after surgery. Your skin needs to be as free of germs as possible. You can reduce the number of germs on your skin by washing with CHG (chlorhexidine  gluconate) soap before surgery. CHG is an antiseptic soap that kills germs and continues to kill germs even after washing.   DO NOT use if you have an allergy to chlorhexidine /CHG or antibacterial soaps. If your skin becomes reddened or irritated, stop using the CHG and notify one of our RNs at [phone].   Please shower with the CHG soap  starting 4 days before surgery using the following schedule:     Please keep in mind the following:  DO NOT shave, including legs and underarms, starting the day of your first shower.   You may shave your face at any point before/day of surgery.  Place clean sheets on your bed the day you start using CHG soap. Use a clean washcloth (not used since being washed) for each shower. DO NOT sleep with pets once you start using the CHG.   CHG Shower Instructions:  If you choose to wash your hair and private area, wash first with your normal shampoo/soap.  After you use shampoo/soap, rinse your hair and body thoroughly to remove  shampoo/soap residue.  Turn the water  OFF and apply about 3 tablespoons (45 ml) of CHG soap to a CLEAN washcloth.  Apply CHG soap ONLY FROM YOUR NECK DOWN TO YOUR TOES (washing for 3-5 minutes)  DO NOT use CHG soap on face, private areas, open wounds, or sores.  Pay special attention to the area where your surgery is being performed.  If you are having back surgery, having someone wash your back for you may be helpful. Wait 2 minutes after CHG soap is applied, then you may rinse off the CHG soap.  Pat dry with a clean towel  Put on clean clothes/pajamas   If you choose to wear lotion, please use ONLY the CHG-compatible lotions on the back of this paper.     Additional instructions for the day of surgery: DO NOT APPLY any lotions, deodorants, cologne, or perfumes.   Put on clean/comfortable clothes.  Brush your teeth.  Ask your nurse before applying any prescription medications to the skin.      CHG Compatible Lotions   Aveeno Moisturizing lotion  Cetaphil Moisturizing Cream  Cetaphil Moisturizing Lotion  Clairol Herbal Essence Moisturizing Lotion, Dry Skin  Clairol Herbal Essence Moisturizing Lotion, Extra Dry Skin  Clairol Herbal Essence Moisturizing Lotion, Normal Skin  Curel Age Defying Therapeutic Moisturizing Lotion with Alpha Hydroxy  Curel Extreme Care Body Lotion  Curel Soothing Hands Moisturizing Hand Lotion  Curel Therapeutic Moisturizing Cream, Fragrance-Free  Curel Therapeutic Moisturizing Lotion, Fragrance-Free  Curel Therapeutic Moisturizing Lotion, Original Formula  Eucerin Daily Replenishing Lotion  Eucerin Dry Skin Therapy Plus Alpha Hydroxy Crme  Eucerin Dry Skin Therapy Plus Alpha Hydroxy Lotion  Eucerin Original Crme  Eucerin Original Lotion  Eucerin Plus Crme Eucerin Plus Lotion  Eucerin TriLipid Replenishing Lotion  Keri Anti-Bacterial Hand Lotion  Keri Deep Conditioning Original Lotion Dry Skin Formula Softly Scented  Keri Deep Conditioning  Original Lotion, Fragrance Free Sensitive Skin Formula  Keri Lotion Fast Absorbing Fragrance Free Sensitive Skin Formula  Keri Lotion Fast Absorbing Softly Scented Dry Skin Formula  Keri Original Lotion  Keri Skin Renewal Lotion Keri Silky Smooth Lotion  Keri Silky Smooth Sensitive Skin Lotion  Nivea Body Creamy Conditioning Oil  Nivea Body Extra Enriched Teacher, Adult Education Moisturizing Lotion Nivea Crme  Nivea Skin Firming Lotion  NutraDerm 30 Skin Lotion  NutraDerm Skin Lotion  NutraDerm Therapeutic Skin Cream  NutraDerm Therapeutic Skin Lotion  ProShield Protective Hand Cream  Provon moisturizing lotion

## 2023-11-15 NOTE — Progress Notes (Addendum)
 Anesthesia Review:  PCP: DR Janey  Cardiologist : Charlies arthur LVO 09/27/23  Lum Louis, NP clearance on 11/14/23.   PPM/ ICD: Device Orders: Rep Notified:  Chest x-ray : EKG : 09/27/2023  Echo : 10/30/23  Stress test: Cardiac Cath :   Activity level: can do a flight of stairs without difficutly  Sleep Study/ CPAP : none  Fasting Blood Sugar :      / Checks Blood Sugar -- times a day:    Blood Thinner/ Instructions /Last Dose: ASA / Instructions/ Last Dose :    Xarelto - last dose on 11/29/2023.    LVMM at 1102am.  PT was 24 minutes late for preop appt.   Husband with pt at preop appt.     NO B/p etc to left arm   PT with recent hx of nosebleed on right side.  PT LVO with Vaughan Ricker was 11/19/23.  PT states DR Ricker stated only to use left side of nose if necessary due to recent nosebleeds.  PCR swab was performed on left nostrial only.   Initial ov for nose bleed was on 11/09/23 with DR Slotnicki.    Had to repeat instructions several times for pt and highlight items along with writing in calendar for pt.    Cbc done 11/20/23 with hgb of 9.8 rotued to DR Aluisio on 11/20/23. Cornelius Ward,PAc aware and of above resent nosebleed.

## 2023-11-19 DIAGNOSIS — Z7901 Long term (current) use of anticoagulants: Secondary | ICD-10-CM | POA: Diagnosis not present

## 2023-11-19 DIAGNOSIS — R040 Epistaxis: Secondary | ICD-10-CM | POA: Diagnosis not present

## 2023-11-19 NOTE — Telephone Encounter (Signed)
 Patient with diagnosis of afib on Xarelto  for anticoagulation.    Procedure:  Left total knee arthroplasty    Date of Surgery:  Clearance 12/03/23    CHA2DS2-VASc Score = 5   This indicates a 7.2% annual risk of stroke. The patient's score is based upon: CHF History: 1 HTN History: 1 Diabetes History: 0 Stroke History: 0 Vascular Disease History: 0 Age Score: 2 Gender Score: 1    CrCl 87 ml/min Platelet count 237 K  Patient has not had an Afib/aflutter ablation within the last 3 months or DCCV within the last 30 days  Per office protocol, patient can hold Xarelto  for 3 days prior to procedure.    Patient will not need bridging with Lovenox  (enoxaparin ) around procedure.  **This guidance is not considered finalized until pre-operative APP has relayed final recommendations.**

## 2023-11-20 ENCOUNTER — Encounter (HOSPITAL_COMMUNITY)
Admission: RE | Admit: 2023-11-20 | Discharge: 2023-11-20 | Disposition: A | Discharge status: Home / Self Care | Source: Ambulatory Visit | Attending: Orthopedic Surgery | Admitting: Orthopedic Surgery

## 2023-11-20 ENCOUNTER — Encounter (HOSPITAL_COMMUNITY): Payer: Self-pay

## 2023-11-20 ENCOUNTER — Other Ambulatory Visit: Payer: Self-pay

## 2023-11-20 VITALS — BP 145/83 | HR 72 | Temp 98.5°F | Resp 16 | Ht 66.5 in

## 2023-11-20 DIAGNOSIS — Z01818 Encounter for other preprocedural examination: Secondary | ICD-10-CM | POA: Insufficient documentation

## 2023-11-20 HISTORY — DX: Other complications of anesthesia, initial encounter: T88.59XA

## 2023-11-20 LAB — BASIC METABOLIC PANEL WITH GFR
Anion gap: 10 (ref 5–15)
BUN: 15 mg/dL (ref 8–23)
CO2: 24 mmol/L (ref 22–32)
Calcium: 9.3 mg/dL (ref 8.9–10.3)
Chloride: 103 mmol/L (ref 98–111)
Creatinine, Ser: 0.86 mg/dL (ref 0.44–1.00)
GFR, Estimated: >60 mL/min (ref >60)
Glucose, Bld: 91 mg/dL (ref 70–99)
Potassium: 3.7 mmol/L (ref 3.5–5.1)
Sodium: 137 mmol/L (ref 135–145)

## 2023-11-20 LAB — SURGICAL PCR SCREEN
MRSA, PCR: NEGATIVE
Staphylococcus aureus: NEGATIVE

## 2023-11-20 LAB — CBC
HCT: 32.1 % — ABNORMAL LOW (ref 36.0–46.0)
Hemoglobin: 9.8 g/dL — ABNORMAL LOW (ref 12.0–15.0)
MCH: 27.1 pg (ref 26.0–34.0)
MCHC: 30.5 g/dL (ref 30.0–36.0)
MCV: 88.7 fL (ref 80.0–100.0)
Platelets: 171 K/uL (ref 150–400)
RBC: 3.62 MIL/uL — ABNORMAL LOW (ref 3.87–5.11)
RDW: 16.3 % — ABNORMAL HIGH (ref 11.5–15.5)
WBC: 3.1 K/uL — ABNORMAL LOW (ref 4.0–10.5)
nRBC: 0.7 % — ABNORMAL HIGH (ref 0.0–0.2)

## 2023-11-20 NOTE — Telephone Encounter (Signed)
   Patient Name: Bridget Cortez  DOB: [birth year] MRN: [id number]  Primary Cardiologist: Lonni Cash, MD  Chart reviewed as part of pre-operative protocol coverage. Given past medical history and time since last visit, based on ACC/AHA guidelines, Bridget Cortez is at acceptable risk for the planned procedure without further cardiovascular testing.   Per Dr. Almetta: RCRI 0-1 with HFpEF. Low-intermediate risk to proceed with knee arthroplasty. C2V (5) however no atrial arrhythmias in several years so interruption of OAC is relatively low risk pre and post procedure.  Per office protocol, patient can hold Xarelto  for 3 days prior to procedure.     Patient will not need bridging with Lovenox  (enoxaparin ) around procedure.  I will route this recommendation to the requesting party via Epic fax function and remove from pre-op pool.  Please call with questions.  Bridget LITTIE Louis, NP 11/20/2023, 8:44 AM

## 2023-11-22 ENCOUNTER — Telehealth: Payer: Self-pay

## 2023-11-22 NOTE — Telephone Encounter (Addendum)
 Patient called and left a voicemail stating that she had her labs drawn on Tuesday and that her hemoglobin is 9.8, which she reports is too low for her knee surgery.  Discussed with Dr. Sherrod, who recommended that the patient could receive 1 unit of blood intraoperatively if needed.  Attempted to contact the patient to relay this information. Spoke with the patient's spouse, who stated that the patient is not home at the moment and will return the call.

## 2023-11-22 NOTE — Telephone Encounter (Signed)
 Patient stated that her upcoming knee surgery was canceled due to a hemoglobin of 9.8. She also reported having recent nosebleeds this month. The surgical team plans to postpone the procedure until her hemoglobin increases. Patient stated that the surgeon would like her hemoglobin to be between 10-11 prior to proceeding with surgery.  Patient reports she has an appointment with Dr. Sherrod on 11/28/23. Instructed patient to call the office if any new symptoms arise. Patient voiced understanding.

## 2023-11-23 NOTE — Telephone Encounter (Signed)
 12/03/2023 knee surgery has been canceled.

## 2023-11-26 ENCOUNTER — Telehealth: Payer: Self-pay

## 2023-11-26 ENCOUNTER — Telehealth: Payer: Self-pay | Admitting: Physician Assistant

## 2023-11-26 NOTE — Telephone Encounter (Signed)
 Received a message from the scheduling team that the patient had a question about their upcoming appt.   Spoke with the patient and informed her that Dr. Sherrod had to rearrange some patients for that day because he is on call. Informed patient that she is rescheduled to see Cassie, PA on 12/07/23 @ 10 AM. She voiced understanding.

## 2023-11-26 NOTE — Telephone Encounter (Signed)
 Called the pt to rescheduled her appt. Pt is aware of her upcoming appt and date and time.

## 2023-11-28 ENCOUNTER — Inpatient Hospital Stay: Admitting: Internal Medicine

## 2023-11-28 DIAGNOSIS — I4891 Unspecified atrial fibrillation: Secondary | ICD-10-CM | POA: Diagnosis not present

## 2023-11-28 DIAGNOSIS — Z7901 Long term (current) use of anticoagulants: Secondary | ICD-10-CM | POA: Diagnosis not present

## 2023-11-28 DIAGNOSIS — J309 Allergic rhinitis, unspecified: Secondary | ICD-10-CM | POA: Diagnosis not present

## 2023-11-28 DIAGNOSIS — J069 Acute upper respiratory infection, unspecified: Secondary | ICD-10-CM | POA: Diagnosis not present

## 2023-11-28 DIAGNOSIS — R040 Epistaxis: Secondary | ICD-10-CM | POA: Diagnosis not present

## 2023-11-30 ENCOUNTER — Inpatient Hospital Stay: Admitting: Nurse Practitioner

## 2023-12-03 ENCOUNTER — Ambulatory Visit (HOSPITAL_COMMUNITY): Admission: RE | Admit: 2023-12-03 | Source: Ambulatory Visit | Admitting: Orthopedic Surgery

## 2023-12-03 ENCOUNTER — Encounter (HOSPITAL_COMMUNITY): Admission: RE | Payer: Self-pay | Source: Ambulatory Visit

## 2023-12-03 SURGERY — ARTHROPLASTY, KNEE, TOTAL
Anesthesia: Choice | Site: Knee | Laterality: Left

## 2023-12-03 NOTE — Progress Notes (Unsigned)
 [facility] OFFICE PROGRESS NOTE  Bridget Santos, MD [address] [place] [address]  DIAGNOSIS: Leukocytopenia and mild anemia.  Dr. Sherrod initially felt her leukocytopenia was drug-induced from her current treatment rosuvastatin ; however, she did not have any improvement with discontinuation of the drug.  She also has mild anemia of chronic disease. She had bone marrow biopsy and aspirate that showed 4% plasma cells.   PRIOR THERAPY: None  CURRENT THERAPY: 1) The counter iron supplement 2) Over the counter B12, she stopped this due to being told this is high   INTERVAL HISTORY: Bridget Cortez 78 y.o. female returns to the clinic today for a follow-up visit accompanied by her husband. She was last seen by Dr.Mohamed on 10/03/23.   Per chart review it looks like she is followed for history of leukocytopenia and mild anemia which is likely related to chronic disease.  She had a bone marrow biopsy and aspirate that did not show any concerning bone marrow abnormalities except for slight increase of plasma cells representing 4% total.  She is currently on observation with repeat blood work.  Dr. Sherrod had recommended that she start taking over-the-counter iron supplement with vitamin C  as well as B12 which she is compliant with daily. She states her B12 was high though and she no longer is on B12 but takes a multivitamin.   The patient is very tearful today. She was supposed to get a knee replacement but it was postponed due to needing her Hbg >10. The surgery, initially planned for November 10th, was canceled due to these low hemoglobin levels. She has been experiencing recurrent epistaxis in October 2025 for which she saw ENT. Her bleeding is improved. She had significant nose bleeding in August 2025.   She has been taking iron supplements twice a week and was previously on B12, which she stopped as per her doctor's advice. She is concerned about her hemoglobin  levels and is seeking ways to increase them to proceed with her knee surgery.  Her past medical history includes breast cancer treated with radiation in 1999, arthritis, and asthma. She is on Xarelto  for a heart condition that was diagnosed after her heart was found to be beating too fast. She has no known kidney problems.   No gastrointestinal bleeding, blood in stool, or urine. She takes Tylenol  for pain and does not use ibuprofen  or other NSAIDs. She is concerned about the impact of her anemia on her overall health and the delay in her knee surgery. She is having a lot of pain which is affecting her mobility. Her last colonoscopy was in 2019.   She is here today for evaluation and repeat blood work.    MEDICAL HISTORY: Past Medical History:  Diagnosis Date   Abnormal Pap smear 2006   vaginal medicine according to patient   Allergy    Arthritis    Asthma    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 eye    cataract removed.   Chiari malformation    Complication of anesthesia    woke up during left breast surgery age 89   Diverticulosis    Dysrhythmia    SVT   GERD (gastroesophageal reflux disease)    H/O osteopenia    08/2006   H/O varicella    Heart murmur    Hypertension    Monilial vaginitis  07/2007   Multiple allergies    Ovarian cyst    Personal history of colonic adenomas 03/13/2012   Personal history of radiation therapy 1990   Pneumonia 07/2014   VIN III (vulvar intraepithelial neoplasia III) 03/2009   Yeast infection     ALLERGIES:  is allergic to ephedrine  and cortisone.  MEDICATIONS:  Current Outpatient Medications  Medication Sig Dispense Refill   acetaminophen  (TYLENOL ) 500 MG tablet Take 1,000 mg by mouth every 6 (six) hours as needed for moderate pain (pain score 4-6).     albuterol  (PROVENTIL ) (5 MG/ML) 0.5% nebulizer solution Take 0.5 mLs (2.5 mg total) by  nebulization every 6 (six) hours as needed for wheezing or shortness of breath. 20 mL 12   amLODipine  (NORVASC ) 10 MG tablet Take 5 mg by mouth every evening.     Ascorbic Acid  (VITAMIN C  WITH ROSE HIPS PO) Take 1 tablet by mouth daily.     betamethasone  dipropionate 0.05 % cream Apply 1 Application topically 2 (two) times daily as needed (psoriasis).     Calcium  Carbonate-Vitamin D  (CALTRATE 600+D PO) Take 1 tablet by mouth in the morning.     cetirizine (ZYRTEC) 10 MG tablet Take 10 mg by mouth in the morning.     cholecalciferol (VITAMIN D3) 25 MCG (1000 UNIT) tablet Take 1,000 Units by mouth daily.     esomeprazole (NEXIUM) 40 MG capsule Take 40 mg by mouth 2 (two) times daily before a meal.     Ferrous Sulfate (IRON PO) Take 1 tablet by mouth 2 (two) times a week.     Fluticasone -Salmeterol (ADVAIR) 250-50 MCG/DOSE AEPB Inhale 1 puff into the lungs in the morning and at bedtime.     Multiple Vitamin (MULTIVITAMIN) tablet Take 1 tablet by mouth in the morning.     nebivolol  (BYSTOLIC ) 5 MG tablet Take 1 tablet by mouth once daily 90 tablet 2   potassium chloride  SA (KLOR-CON ) 20 MEQ tablet Take 20 mEq by mouth 3 (three) times daily.     rivaroxaban  (XARELTO ) 20 MG TABS tablet Take 1 tablet (20 mg total) by mouth daily with supper. 90 tablet 1   rosuvastatin  (CRESTOR ) 5 MG tablet Take 5 mg by mouth every evening.     simethicone  (MYLICON) 125 MG chewable tablet Chew 125 mg by mouth every 6 (six) hours as needed for flatulence.     SSD 1 % cream Apply 1 Application topically 2 (two) times daily as needed (chafing).     telmisartan (MICARDIS) 40 MG tablet Take 40 mg by mouth in the morning.     torsemide  (DEMADEX ) 20 MG tablet Take 1 tablet (20 mg total) by mouth daily. 90 tablet 2   traMADol (ULTRAM) 50 MG tablet Take 50 mg by mouth every 6 (six) hours as needed for severe pain (pain score 7-10).     triamcinolone  cream (KENALOG) 0.1 % Apply 1 application  topically 2 (two) times daily as  needed (itching.).     loperamide  (IMODIUM ) 2 MG capsule Take 1 capsule (2 mg total) by mouth 2 (two) times daily as needed for diarrhea or loose stools. (Patient not taking: Reported on 12/07/2023) 14 capsule 0   oxyCODONE  (OXY IR/ROXICODONE ) 5 MG immediate release tablet Take 0.5-1 tablets (2.5-5 mg total) by mouth every 6 (six) hours as needed for severe pain. (Patient not taking: Reported on 12/07/2023) 15 tablet 0   No current facility-administered medications for this visit.    SURGICAL HISTORY:  Past Surgical History:  Procedure Laterality Date   ABDOMINAL HYSTERECTOMY     ABDOMINAL SURGERY     BREAST DUCTAL SYSTEM EXCISION Right 08/08/2022   Procedure: RIGHT BREAST CENTRAL DUCT EXCISION;  Surgeon: Aron Shoulders, MD;  Location: [facility] OR;  Service: General;  Laterality: Right;   BREAST EXCISIONAL BIOPSY Right    benign   BREAST LUMPECTOMY Left 1990   BREAST SURGERY     Lumpectomy, left breast   CARDIOVERSION N/A 07/28/2020   Procedure: CARDIOVERSION;  Surgeon: Kate Lonni CROME, MD;  Location: [facility] ENDOSCOPY;  Service: Cardiovascular;  Laterality: N/A;   CATARACT EXTRACTION Left 2018   COLONOSCOPY     COLONOSCOPY W/ BIOPSIES     EYE SURGERY     HERNIA REPAIR     KNEE SURGERY     right   lt. eye aneurysm surgery Left 2017   MOUTH SURGERY  2019   rt. side gum surgery  Lt. done 3 years ago   RADIOACTIVE SEED GUIDED EXCISIONAL BREAST BIOPSY Right 07/09/2018   Procedure: RADIOACTIVE SEED GUIDED EXCISIONAL RIGHT  BREAST BIOPSY;  Surgeon: Aron Shoulders, MD;  Location: [facility] OR;  Service: General;  Laterality: Right;   TOTAL KNEE ARTHROPLASTY Right 09/09/2019   Procedure: RIGHT TOTAL KNEE ARTHROPLASTY;  Surgeon: Sheril Coy, MD;  Location: [facility] ORS;  Service: Orthopedics;  Laterality: Right;    REVIEW OF SYSTEMS:   Review of Systems  Constitutional: Positive for fatigue. Negative for appetite change, chills, fatigue, fever and unexpected weight change.  HENT:   Negative for mouth  sores, nosebleeds, sore throat and trouble swallowing.   Eyes: Negative for eye problems and icterus.  Respiratory: Negative for cough, hemoptysis, shortness of breath and wheezing.   Cardiovascular: Negative for chest pain. Positive for lower extremity swelling.  Gastrointestinal: Negative for abdominal pain, constipation, diarrhea, nausea and vomiting.  Genitourinary: Negative for bladder incontinence, difficulty urinating, dysuria, frequency and hematuria.   Musculoskeletal: Positive for knee pain. Negative for back pain, gait problem, neck pain and neck stiffness.  Skin: Negative for itching and rash.  Neurological: Negative for dizziness, extremity weakness, gait problem, headaches, light-headedness and seizures.  Hematological: Negative for adenopathy. Does not bruise/bleed easily.  Psychiatric/Behavioral: The patient is tearful. Negative for confusion, depression and sleep disturbance. The patient is not nervous/anxious.     PHYSICAL EXAMINATION:  Blood pressure 130/70, pulse 69, temperature 98.3 F (36.8 C), resp. rate 17, weight 253 lb (114.8 kg), SpO2 97%.  ECOG PERFORMANCE STATUS: 2  Physical Exam  Constitutional: Oriented to person, place, and time and well-developed, well-nourished, and in no distress.  HENT:  Head: Normocephalic and atraumatic.  Mouth/Throat: Oropharynx is clear and moist. No oropharyngeal exudate.  Eyes: Conjunctivae are normal. Right eye exhibits no discharge. Left eye exhibits no discharge. No scleral icterus.  Neck: Normal range of motion. Neck supple.  Cardiovascular: Normal rate, regular rhythm, normal heart sounds and intact distal pulses.   Pulmonary/Chest: Effort normal and breath sounds normal. No respiratory distress. No wheezes. No rales.  Abdominal: Soft. Bowel sounds are normal. Exhibits no distension and no mass. There is no tenderness.  Musculoskeletal: Normal range of motion. Lower extremity edema (reportedly improved compared to  prior) Lymphadenopathy:    No cervical adenopathy.  Neurological: Alert and oriented to person, place, and time. Exhibits muscle wasting. Examined in the wheelchair.  Skin: Skin is warm and dry. No rash noted. Not diaphoretic. No erythema. No pallor.  Psychiatric: Mood, memory and judgment normal.  Vitals reviewed.  LABORATORY DATA: Lab Results  Component Value Date   WBC 3.4 (L) 12/07/2023   HGB 9.8 (L) 12/07/2023   HCT 30.7 (L) 12/07/2023   MCV 84.6 12/07/2023   PLT 290 12/07/2023      Chemistry      Component Value Date/Time   NA 140 12/07/2023 1029   NA 140 03/30/2022 1145   K 3.7 12/07/2023 1029   CL 105 12/07/2023 1029   CO2 28 12/07/2023 1029   BUN 14 12/07/2023 1029   BUN 15 03/30/2022 1145   CREATININE 0.81 12/07/2023 1029      Component Value Date/Time   CALCIUM  9.1 12/07/2023 1029   ALKPHOS 84 12/07/2023 1029   AST 23 12/07/2023 1029   ALT 13 12/07/2023 1029   BILITOT 0.3 12/07/2023 1029       RADIOGRAPHIC STUDIES:  No results found.   ASSESSMENT/PLAN:  This is a very pleasant 78 year old African-American female with persistent leukocytopenia and anemia of unclear etiology Dr. Sherrod initially thought was secondary to drug induced but there is no improvement in her condition after discontinuing Crestor .   She previously underwent a bone marrow biopsy and aspirate which showed no concerning bone marrow abnormalities except for slight increase in plasma cells but only representing 4%.   She is currently on observation.  She is taking an iron supplement.  Her Hbg is 9.8 today. She is very upset and tearful during the encounter today. She repeatedly asked the same questions and remained fixated on her hemoglobin level, expressing concern that it is preventing her from proceeding with surgery. Her iron studies are normal. The patient wanted to know what she can do to improve this. She can continue to take her iron and multivitamin. I will check her B12 to  see if she needs to resume this. Unfortunately, this is trickier when no correctable cause. She does not have CKD. She denies bleeding at this time.   She saw ENT for her epistaxis which has improved.   She had myeloma labs in September 2025.   Per Dr. Sherrod, discussed potential intraoperative transfusion if surgery needed before hemoglobin increases.  We will see her in 1 month and reassess her labs.   Her last bone marrow biopsy and aspirate was in 2022. I do not think she needs repeat bone marrow biopsy and aspirate but can discuss with Dr. Sherrod if no improvement in her anemia. She just had myeloma labs in September 2025 and likely do not need to be repeated at this time.   She can ask if she requires any other temporary intervention for her joint pain such as a steroid injection.   I will send copy of the note to her orthopedic provider.   I also offered stool cards which she declined.    The patient was advised to call immediately if she has any concerning symptoms in the interval. The patient voices understanding of current disease status and treatment options and is in agreement with the current care plan. All questions were answered. The patient knows to call the clinic with any problems, questions or concerns. We can certainly see the patient much sooner if necessary     Orders Placed This Encounter  Procedures   CBC with Differential (Cancer Center Only)    Standing Status:   Future    Expected Date:   01/06/2024    Expiration Date:   12/06/2024    The total time spent in the appointment was 50+ minutes.   Carsten Carstarphen L Shaakira Borrero, [name] 12/07/23

## 2023-12-05 DIAGNOSIS — R040 Epistaxis: Secondary | ICD-10-CM | POA: Diagnosis not present

## 2023-12-05 DIAGNOSIS — Z7901 Long term (current) use of anticoagulants: Secondary | ICD-10-CM | POA: Diagnosis not present

## 2023-12-05 DIAGNOSIS — J3489 Other specified disorders of nose and nasal sinuses: Secondary | ICD-10-CM | POA: Diagnosis not present

## 2023-12-07 ENCOUNTER — Other Ambulatory Visit: Payer: Self-pay

## 2023-12-07 ENCOUNTER — Telehealth: Payer: Self-pay

## 2023-12-07 ENCOUNTER — Inpatient Hospital Stay

## 2023-12-07 ENCOUNTER — Inpatient Hospital Stay: Attending: Internal Medicine | Admitting: Physician Assistant

## 2023-12-07 VITALS — BP 130/70 | HR 69 | Temp 98.3°F | Resp 17 | Wt 253.0 lb

## 2023-12-07 DIAGNOSIS — D649 Anemia, unspecified: Secondary | ICD-10-CM | POA: Diagnosis not present

## 2023-12-07 DIAGNOSIS — D539 Nutritional anemia, unspecified: Secondary | ICD-10-CM

## 2023-12-07 LAB — CMP (CANCER CENTER ONLY)
ALT: 13 U/L (ref 0–44)
AST: 23 U/L (ref 15–41)
Albumin: 3.9 g/dL (ref 3.5–5.0)
Alkaline Phosphatase: 84 U/L (ref 38–126)
Anion gap: 7 (ref 5–15)
BUN: 14 mg/dL (ref 8–23)
CO2: 28 mmol/L (ref 22–32)
Calcium: 9.1 mg/dL (ref 8.9–10.3)
Chloride: 105 mmol/L (ref 98–111)
Creatinine: 0.81 mg/dL (ref 0.44–1.00)
GFR, Estimated: >60 mL/min (ref >60)
Glucose, Bld: 92 mg/dL (ref 70–99)
Potassium: 3.7 mmol/L (ref 3.5–5.1)
Sodium: 140 mmol/L (ref 135–145)
Total Bilirubin: 0.3 mg/dL (ref 0.0–1.2)
Total Protein: 7 g/dL (ref 6.5–8.1)

## 2023-12-07 LAB — CBC WITH DIFFERENTIAL (CANCER CENTER ONLY)
Abs Immature Granulocytes: 0.02 K/uL (ref 0.00–0.07)
Basophils Absolute: 0 K/uL (ref 0.0–0.1)
Basophils Relative: 1 %
Eosinophils Absolute: 0.1 K/uL (ref 0.0–0.5)
Eosinophils Relative: 3 %
HCT: 30.7 % — ABNORMAL LOW (ref 36.0–46.0)
Hemoglobin: 9.8 g/dL — ABNORMAL LOW (ref 12.0–15.0)
Immature Granulocytes: 1 %
Lymphocytes Relative: 33 %
Lymphs Abs: 1.2 K/uL (ref 0.7–4.0)
MCH: 27 pg (ref 26.0–34.0)
MCHC: 31.9 g/dL (ref 30.0–36.0)
MCV: 84.6 fL (ref 80.0–100.0)
Monocytes Absolute: 0.4 K/uL (ref 0.1–1.0)
Monocytes Relative: 11 %
Neutro Abs: 1.8 K/uL (ref 1.7–7.7)
Neutrophils Relative %: 51 %
Platelet Count: 290 K/uL (ref 150–400)
RBC: 3.63 MIL/uL — ABNORMAL LOW (ref 3.87–5.11)
RDW: 16.1 % — ABNORMAL HIGH (ref 11.5–15.5)
WBC Count: 3.4 K/uL — ABNORMAL LOW (ref 4.0–10.5)
nRBC: 0 % (ref 0.0–0.2)

## 2023-12-07 LAB — IRON AND IRON BINDING CAPACITY (CC-WL,HP ONLY)
Iron: 126 ug/dL (ref 28–170)
Saturation Ratios: 40 % — ABNORMAL HIGH (ref 10.4–31.8)
TIBC: 315 ug/dL (ref 250–450)
UIBC: 189 ug/dL (ref 148–442)

## 2023-12-07 LAB — FERRITIN: Ferritin: 46 ng/mL (ref 11–307)

## 2023-12-07 LAB — LACTATE DEHYDROGENASE: LDH: 141 U/L (ref 105–235)

## 2023-12-07 NOTE — Telephone Encounter (Addendum)
 Tried to reach patient in regards to missed appt today.  Asked for a return call to reschedule.   Patient showed up late for appt.

## 2023-12-07 NOTE — Telephone Encounter (Signed)
 Faxed most recent labs and office visit to [facility] with attention to Dr. Melodi with confirmation at [phone].

## 2023-12-10 ENCOUNTER — Other Ambulatory Visit: Payer: Self-pay | Admitting: Physician Assistant

## 2023-12-10 DIAGNOSIS — D649 Anemia, unspecified: Secondary | ICD-10-CM

## 2023-12-11 ENCOUNTER — Telehealth: Payer: Self-pay

## 2023-12-11 MED ORDER — TORSEMIDE 20 MG PO TABS: 20.0000 mg | ORAL_TABLET | Freq: Every day | ORAL | 3 refills | Status: AC

## 2023-12-11 NOTE — Telephone Encounter (Signed)
 Spoke with the patient regarding information from Dr. Sherrod. Informed the patient that per Dr. Sherrod, no additional interventions are needed at this time. She may continue taking her multivitamin and iron supplement. Labs will be rechecked at her next visit in December and will include Vitamin B12 and copper levels. The patient verbalized understanding.

## 2023-12-15 ENCOUNTER — Other Ambulatory Visit: Payer: Self-pay | Admitting: Cardiovascular Disease

## 2023-12-15 DIAGNOSIS — I4892 Unspecified atrial flutter: Secondary | ICD-10-CM

## 2023-12-17 ENCOUNTER — Other Ambulatory Visit: Payer: Self-pay | Admitting: Cardiovascular Disease

## 2023-12-17 DIAGNOSIS — I4892 Unspecified atrial flutter: Secondary | ICD-10-CM

## 2023-12-17 NOTE — Telephone Encounter (Signed)
 Pt last saw Dr Almetta 10/19/23, last labs 12/07/23 Creat 0.81, age 78, weight 114.8kg, CrCl 103.74, based on CrCl pt is on appropriate dosage of Xarelto  20mg  every day for afib.  Will refill rx.

## 2023-12-18 NOTE — Telephone Encounter (Signed)
 Xarelto  was refilled on 12/17/23, see refill encounter in Epic 12/15/23.

## 2023-12-24 ENCOUNTER — Telehealth: Payer: Self-pay

## 2023-12-24 NOTE — Telephone Encounter (Signed)
 Tried to reach patient in regards to voicemail.  LVM with a return call.

## 2023-12-31 ENCOUNTER — Other Ambulatory Visit: Payer: Self-pay

## 2023-12-31 ENCOUNTER — Encounter (HOSPITAL_COMMUNITY): Payer: Self-pay

## 2023-12-31 ENCOUNTER — Emergency Department (HOSPITAL_COMMUNITY)

## 2023-12-31 ENCOUNTER — Emergency Department (HOSPITAL_COMMUNITY)
Admission: EM | Admit: 2023-12-31 | Discharge: 2023-12-31 | Disposition: A | Discharge status: Skilled Nursing Facility | Attending: Emergency Medicine | Admitting: Emergency Medicine

## 2023-12-31 DIAGNOSIS — Z7951 Long term (current) use of inhaled steroids: Secondary | ICD-10-CM | POA: Diagnosis not present

## 2023-12-31 DIAGNOSIS — M25562 Pain in left knee: Secondary | ICD-10-CM

## 2023-12-31 DIAGNOSIS — I110 Hypertensive heart disease with heart failure: Secondary | ICD-10-CM | POA: Diagnosis not present

## 2023-12-31 DIAGNOSIS — Z79899 Other long term (current) drug therapy: Secondary | ICD-10-CM | POA: Diagnosis not present

## 2023-12-31 DIAGNOSIS — I5030 Unspecified diastolic (congestive) heart failure: Secondary | ICD-10-CM | POA: Diagnosis not present

## 2023-12-31 DIAGNOSIS — M1712 Unilateral primary osteoarthritis, left knee: Secondary | ICD-10-CM | POA: Diagnosis not present

## 2023-12-31 DIAGNOSIS — Z96652 Presence of left artificial knee joint: Secondary | ICD-10-CM | POA: Diagnosis not present

## 2023-12-31 DIAGNOSIS — Z853 Personal history of malignant neoplasm of breast: Secondary | ICD-10-CM | POA: Diagnosis not present

## 2023-12-31 DIAGNOSIS — M25462 Effusion, left knee: Secondary | ICD-10-CM | POA: Diagnosis not present

## 2023-12-31 DIAGNOSIS — Z7901 Long term (current) use of anticoagulants: Secondary | ICD-10-CM

## 2023-12-31 DIAGNOSIS — R609 Edema, unspecified: Secondary | ICD-10-CM | POA: Diagnosis not present

## 2023-12-31 DIAGNOSIS — J45909 Unspecified asthma, uncomplicated: Secondary | ICD-10-CM | POA: Diagnosis not present

## 2023-12-31 MED ORDER — TORSEMIDE 20 MG PO TABS
20.0000 mg | ORAL_TABLET | Freq: Every day | ORAL | Status: DC
  Administered 2023-12-31: 20 mg via ORAL
  Filled 2023-12-31: qty 1

## 2023-12-31 MED ORDER — AMLODIPINE BESYLATE 5 MG PO TABS
5.0000 mg | ORAL_TABLET | Freq: Every evening | ORAL | Status: DC
  Administered 2023-12-31: 5 mg via ORAL
  Filled 2023-12-31: qty 1

## 2023-12-31 MED ORDER — ALBUTEROL SULFATE (2.5 MG/3ML) 0.083% IN NEBU: 2.5000 mg | INHALATION_SOLUTION | Freq: Four times a day (QID) | RESPIRATORY_TRACT | Status: DC | PRN

## 2023-12-31 MED ORDER — OXYCODONE-ACETAMINOPHEN 5-325 MG PO TABS
1.0000 | ORAL_TABLET | Freq: Once | ORAL | Status: AC
  Administered 2023-12-31: 1 via ORAL
  Filled 2023-12-31: qty 1

## 2023-12-31 MED ORDER — PREDNISONE 50 MG PO TABS: 50.0000 mg | ORAL_TABLET | Freq: Every day | ORAL | Status: DC

## 2023-12-31 MED ORDER — NEBIVOLOL HCL 5 MG PO TABS
5.0000 mg | ORAL_TABLET | Freq: Every day | ORAL | Status: DC
  Administered 2023-12-31: 5 mg via ORAL
  Filled 2023-12-31: qty 1

## 2023-12-31 MED ORDER — RIVAROXABAN 20 MG PO TABS
20.0000 mg | ORAL_TABLET | Freq: Every day | ORAL | Status: DC
  Administered 2023-12-31: 20 mg via ORAL
  Filled 2023-12-31: qty 1

## 2023-12-31 MED ORDER — IRBESARTAN 150 MG PO TABS
150.0000 mg | ORAL_TABLET | Freq: Every day | ORAL | Status: DC
  Administered 2023-12-31: 150 mg via ORAL
  Filled 2023-12-31: qty 1

## 2023-12-31 MED ORDER — FERROUS SULFATE 325 (65 FE) MG PO TABS: 325.0000 mg | ORAL_TABLET | Freq: Every day | ORAL | Status: DC

## 2023-12-31 MED ORDER — OXYCODONE-ACETAMINOPHEN 5-325 MG PO TABS
1.0000 | ORAL_TABLET | ORAL | Status: DC | PRN
  Administered 2023-12-31 (×2): 1 via ORAL
  Filled 2023-12-31 (×2): qty 1

## 2023-12-31 MED ORDER — ACETAMINOPHEN 500 MG PO TABS: 500.0000 mg | ORAL_TABLET | Freq: Four times a day (QID) | ORAL | Status: DC | PRN

## 2023-12-31 MED ORDER — PREDNISONE 50 MG PO TABS: 50.0000 mg | ORAL_TABLET | Freq: Every day | ORAL | 0 refills | Status: DC

## 2023-12-31 MED ORDER — ROSUVASTATIN CALCIUM 5 MG PO TABS
5.0000 mg | ORAL_TABLET | Freq: Every evening | ORAL | Status: DC
  Filled 2023-12-31: qty 1

## 2023-12-31 MED ORDER — PREDNISONE 20 MG PO TABS
60.0000 mg | ORAL_TABLET | Freq: Once | ORAL | Status: AC
  Administered 2023-12-31: 60 mg via ORAL
  Filled 2023-12-31: qty 3

## 2023-12-31 MED ORDER — LOPERAMIDE HCL 2 MG PO CAPS: 2.0000 mg | ORAL_CAPSULE | Freq: Two times a day (BID) | ORAL | Status: DC | PRN

## 2023-12-31 MED ORDER — OXYCODONE HCL 5 MG PO TABS: 2.5000 mg | ORAL_TABLET | Freq: Four times a day (QID) | ORAL | 0 refills | Status: DC | PRN

## 2023-12-31 MED ORDER — LORATADINE 10 MG PO TABS
10.0000 mg | ORAL_TABLET | Freq: Every day | ORAL | Status: DC
  Administered 2023-12-31: 10 mg via ORAL
  Filled 2023-12-31: qty 1

## 2023-12-31 MED ORDER — PANTOPRAZOLE SODIUM 40 MG PO TBEC
40.0000 mg | DELAYED_RELEASE_TABLET | Freq: Every day | ORAL | Status: DC
  Administered 2023-12-31: 40 mg via ORAL
  Filled 2023-12-31: qty 1

## 2023-12-31 MED ORDER — PSEUDOEPHEDRINE HCL ER 120 MG PO TB12
120.0000 mg | ORAL_TABLET | Freq: Two times a day (BID) | ORAL | Status: DC
  Administered 2023-12-31: 120 mg via ORAL
  Filled 2023-12-31: qty 1

## 2023-12-31 MED ORDER — PREDNISONE 20 MG PO TABS: 60.0000 mg | ORAL_TABLET | Freq: Every day | ORAL | Status: DC

## 2023-12-31 MED ORDER — IRON 325 (65 FE) MG PO TABS: 1.0000 | ORAL_TABLET | Freq: Every day | ORAL | Status: DC

## 2023-12-31 NOTE — Progress Notes (Signed)
 Awaiting PT eval.

## 2023-12-31 NOTE — ED Provider Notes (Signed)
 Patient was unable to ambulate, have set her up with SNF placement.  Will discharge.   Mannie Pac T, DO 12/31/23 1342

## 2023-12-31 NOTE — Evaluation (Addendum)
 Physical Therapy Brief Evaluation and Discharge Note Patient Details Name: Bridget Cortez MRN: [id number] DOB: 04/26/1945 Today's Date: 12/31/2023   History of Present Illness  78 y.o. female presented from home w/ c/o left knee swelling and pain, she was unable to get up from her chair with assist from spouse.  Pt was due for Total left knee replacement approx 1 month ago, but wasn't able to have completed due to low hemoglobin. PMH: R TKA  2021, chiari malformation, HTN, breast cancer, asthma.  Clinical Impression  Pt admitted with above diagnosis. Pt reports she ambulates with a cane independently at baseline. She reports 2 days of inability to stand/walk 2* severe L knee pain. Pt was very teary during PT evaluation. She reported she doesn't know why she was moved from a room to a hallway bed, that she never got breakfast, and that nobody has checked on her. Attempted supine to sit at edge of bed however pt had severe pain in L knee with very minimal movement of LLE. She was unable to get to edge of bed but did put forth good effort. Mechanical lift recommended for transfers. Patient will benefit from continued inpatient follow up therapy, <3 hours/day.  Pt currently with functional limitations due to the deficits listed below (see PT Problem List). Pt will benefit from acute skilled PT to increase their independence and safety with mobility to allow discharge.          PT Assessment    Assistance Needed at Discharge       Equipment Recommendations Wheelchair (measurements PT);Wheelchair cushion (measurements PT)  Recommendations for Other Services       Precautions/Restrictions Precautions Precautions: Fall Recall of Precautions/Restrictions: Intact Restrictions Weight Bearing Restrictions Per Provider Order: No        Mobility  Bed Mobility          Transfers                   General transfer comment: unable. mechanical lift recommended     Ambulation/Gait           General Gait Details: unable  Home Activity Instructions    Stairs            Modified Rankin (Stroke Patients Only)        Balance                          Pertinent Vitals/Pain   Pain Assessment Pain Assessment: 0-10 Pain Score: 10-Worst pain ever Pain Location: L knee with minimal movement Pain Descriptors / Indicators: Grimacing, Guarding, Crying Pain Intervention(s): Limited activity within patient's tolerance, Monitored during session, Repositioned, Patient requesting pain meds-RN notified, Heat applied     Home Living   Living Arrangements: Spouse/significant other       Home Equipment: Rolling Walker (2 wheels);Shower seat;Cane - single point        Prior Function        UE/LE Assessment               Communication   Communication Communication: No apparent difficulties     Cognition         General Comments      Exercises     Assessment/Plan    PT Problem List Decreased activity tolerance;Decreased mobility;Pain;Obesity;Decreased range of motion;Decreased strength       PT Visit Diagnosis Difficulty in walking, not elsewhere classified (R26.2);Pain    No Skilled PT  Co-evaluation                AMPAC 6 Clicks Help needed turning from your back to your side while in a flat bed without using bedrails?: Total Help needed moving from lying on your back to sitting on the side of a flat bed without using bedrails?: Total Help needed moving to and from a bed to a chair (including a wheelchair)?: Total Help needed standing up from a chair using your arms (e.g., wheelchair or bedside chair)?: Total Help needed to walk in hospital room?: Total Help needed climbing 3-5 steps with a railing? : Total 6 Click Score: 6      End of Session   Activity Tolerance: Patient limited by pain Patient left: in bed (pt in hallway of ED, no call bell available) Nurse Communication:  Mobility status;Need for lift equipment PT Visit Diagnosis: Difficulty in walking, not elsewhere classified (R26.2);Pain Pain - Right/Left: Left Pain - part of body: Knee     Time: 8995-8972 PT Time Calculation (min) (ACUTE ONLY): 23 min  Charges:   PT Evaluation $PT Eval Moderate Complexity: 1 Mod PT Treatments $Therapeutic Activity: 8-22 mins    Sylvan Nest Kistler PT 12/31/2023  Acute Rehabilitation Services  Office [phone]

## 2023-12-31 NOTE — Discharge Instructions (Signed)
 Apply ice to your left knee.  Ice to be applied for 30 minutes at a time, 4-5 times a day.  You may take acetaminophen  as needed for pain.  If you are not getting sufficient pain relief, you may add oxycodone .  Oxycodone  and acetaminophen  work on pain and completely different ways, and their effects added to each other.  Follow-up with your orthopedic surgeon.  Hopefully, you can get your knee surgery scheduled as soon as possible.

## 2023-12-31 NOTE — Progress Notes (Addendum)
 Pt accepted [facility] and can admit today. Awaiting confirmation from [facility]. [facility] to transport.   Addend @ 1:41PM Pt is eligible to use waiver. Tanya notified. Pt can admit today. PT[facility] to transport. RN and EDP notified via secure chat.

## 2023-12-31 NOTE — [place] FL2 (Signed)
 [place]  MEDICAID FL2 LEVEL OF CARE FORM     IDENTIFICATION  Patient Name: Bridget Cortez Birthdate: February 09, 1945 Sex: female Admission Date (Current Location): 12/31/2023  [facility] and [place] Number:  Producer, Television/film/video and Address:  [facility],  [address], [place] [address]      Provider Number: [id number]  Attending Physician Name and Address:  Mannie Fairy DASEN, DO  Relative Name and Phone Number:  [place], [place] (Spouse)  [phone] [facility])    Current Level of Care: Hospital Recommended Level of Care: Skilled Nursing Facility Prior Approval Number:    Date Approved/Denied:   PASRR Number: [id number]  Discharge Plan: SNF    Current Diagnoses: Patient Active Problem List   Diagnosis Date Noted   Anemia 12/07/2023   PAF (paroxysmal atrial fibrillation) (HCC)/Flutter 10/04/2022   Leukocytopenia 09/15/2020   Deficiency anemia 09/15/2020   Persistent atrial fibrillation (HCC)    Primary osteoarthritis of right knee 09/09/2019   (HFpEF) heart failure with preserved ejection fraction (HCC) 04/13/2019   SVT (supraventricular tachycardia) 08/04/2017   Wide-complex tachycardia 08/03/2017   Asthma 08/06/2014   Anxiety about health 08/06/2014   Essential hypertension 08/06/2014   History of colonic polyps 03/20/2012   Chiari malformation    Heart murmur    H/O varicella    Yeast infection    Cancer (HCC)    H/O osteopenia    Abnormal Pap smear    Vulvar intraepithelial neoplasia III (VIN III) 04/12/2009   Atrophy of vagina 06/23/2005    Orientation RESPIRATION BLADDER Height & Weight     Self, Time, Situation, Place  Normal Incontinent Weight: 253 lb 8.5 oz (115 kg) Height:  5' 6.5 (168.9 cm)  BEHAVIORAL SYMPTOMS/MOOD NEUROLOGICAL BOWEL NUTRITION STATUS      Incontinent Diet  AMBULATORY STATUS COMMUNICATION OF NEEDS Skin   Extensive Assist Verbally Normal                       Personal Care Assistance Level of  Assistance  Bathing, Feeding, Dressing Bathing Assistance: Maximum assistance Feeding assistance: Limited assistance Dressing Assistance: Maximum assistance     Functional Limitations Info  Sight, Hearing, Speech Sight Info: Adequate Hearing Info: Adequate Speech Info: Adequate    SPECIAL CARE FACTORS FREQUENCY  PT (By licensed PT), OT (By licensed OT)     PT Frequency: x5/week OT Frequency: x5/week            Contractures Contractures Info: Not present    Additional Factors Info  Code Status, Allergies Code Status Info: Full Allergies Info: Ephedrine   Cortisone           Current Medications (12/31/2023):  This is the current hospital active medication list Current Facility-Administered Medications  Medication Dose Route Frequency Provider Last Rate Last Admin   acetaminophen  (TYLENOL ) tablet 500 mg  500 mg Oral Q6H PRN Raford Lenis, MD       albuterol  (PROVENTIL ) (2.5 MG/3ML) 0.083% nebulizer solution 2.5 mg  2.5 mg Nebulization Q6H PRN Mannie Fairy T, DO       amLODipine  (NORVASC ) tablet 5 mg  5 mg Oral QPM Raford Lenis, MD       ferrous sulfate  tablet 325 mg  325 mg Oral Q breakfast Raford Lenis, MD       irbesartan  (AVAPRO ) tablet 150 mg  150 mg Oral Daily Raford Lenis, MD   150 mg at 12/31/23 1143   loperamide  (IMODIUM ) capsule 2 mg  2 mg Oral BID PRN Mannie,  Fairy DASEN, DO       loratadine  (CLARITIN ) tablet 10 mg  10 mg Oral Daily Mannie Fairy T, DO       And   pseudoephedrine  (SUDAFED) 12 hr tablet 120 mg  120 mg Oral BID Mannie Fairy T, DO       nebivolol  (BYSTOLIC ) tablet 5 mg  5 mg Oral Daily Raford Lenis, MD   5 mg at 12/31/23 1143   oxyCODONE -acetaminophen  (PERCOCET/ROXICET) 5-325 MG per tablet 1 tablet  1 tablet Oral Q4H PRN Raford Lenis, MD   1 tablet at 12/31/23 1031   pantoprazole  (PROTONIX ) EC tablet 40 mg  40 mg Oral Daily Raford Lenis, MD   40 mg at 12/31/23 1031   [START ON 01/01/2024] predniSONE  (DELTASONE ) tablet 50 mg  50 mg Oral Daily  Raford Lenis, MD       rivaroxaban  (XARELTO ) tablet 20 mg  20 mg Oral Q supper Mannie Fairy T, DO       rosuvastatin  (CRESTOR ) tablet 5 mg  5 mg Oral QPM Raford Lenis, MD       torsemide  (DEMADEX ) tablet 20 mg  20 mg Oral Daily Raford Lenis, MD   20 mg at 12/31/23 1031   Current Outpatient Medications  Medication Sig Dispense Refill   predniSONE  (DELTASONE ) 50 MG tablet Take 1 tablet (50 mg total) by mouth daily. 5 tablet 0   acetaminophen  (TYLENOL ) 500 MG tablet Take 1,000 mg by mouth every 6 (six) hours as needed for moderate pain (pain score 4-6).     albuterol  (PROVENTIL ) (5 MG/ML) 0.5% nebulizer solution Take 0.5 mLs (2.5 mg total) by nebulization every 6 (six) hours as needed for wheezing or shortness of breath. 20 mL 12   amLODipine  (NORVASC ) 10 MG tablet Take 5 mg by mouth every evening.     Ascorbic Acid  (VITAMIN C  WITH ROSE HIPS PO) Take 1 tablet by mouth daily.     betamethasone  dipropionate 0.05 % cream Apply 1 Application topically 2 (two) times daily as needed (psoriasis).     Calcium  Carbonate-Vitamin D  (CALTRATE 600+D PO) Take 1 tablet by mouth in the morning.     cetirizine (ZYRTEC) 10 MG tablet Take 10 mg by mouth in the morning.     cholecalciferol (VITAMIN D3) 25 MCG (1000 UNIT) tablet Take 1,000 Units by mouth daily.     esomeprazole (NEXIUM) 40 MG capsule Take 40 mg by mouth 2 (two) times daily before a meal.     Ferrous Sulfate  (IRON  PO) Take 1 tablet by mouth 2 (two) times a week.     Fluticasone -Salmeterol (ADVAIR) 250-50 MCG/DOSE AEPB Inhale 1 puff into the lungs in the morning and at bedtime.     loperamide  (IMODIUM ) 2 MG capsule Take 1 capsule (2 mg total) by mouth 2 (two) times daily as needed for diarrhea or loose stools. (Patient not taking: Reported on 12/07/2023) 14 capsule 0   Multiple Vitamin (MULTIVITAMIN) tablet Take 1 tablet by mouth in the morning.     nebivolol  (BYSTOLIC ) 5 MG tablet Take 1 tablet by mouth once daily 90 tablet 2   oxyCODONE  (OXY  IR/ROXICODONE ) 5 MG immediate release tablet Take 0.5-1 tablets (2.5-5 mg total) by mouth every 6 (six) hours as needed for severe pain (pain score 7-10). 15 tablet 0   potassium chloride  SA (KLOR-CON ) 20 MEQ tablet Take 20 mEq by mouth 3 (three) times daily.     rivaroxaban  (XARELTO ) 20 MG TABS tablet TAKE 1 TABLET BY MOUTH ONCE DAILY WITH  SUPPER 90 tablet 1   rosuvastatin  (CRESTOR ) 5 MG tablet Take 5 mg by mouth every evening.     simethicone  (MYLICON) 125 MG chewable tablet Chew 125 mg by mouth every 6 (six) hours as needed for flatulence.     SSD 1 % cream Apply 1 Application topically 2 (two) times daily as needed (chafing).     telmisartan (MICARDIS) 40 MG tablet Take 40 mg by mouth in the morning.     torsemide  (DEMADEX ) 20 MG tablet Take 1 tablet (20 mg total) by mouth daily. 90 tablet 3   traMADol (ULTRAM) 50 MG tablet Take 50 mg by mouth every 6 (six) hours as needed for severe pain (pain score 7-10).     triamcinolone  cream (KENALOG) 0.1 % Apply 1 application  topically 2 (two) times daily as needed (itching.).       Discharge Medications: Please see discharge summary for a list of discharge medications.  Relevant Imaging Results:  Relevant Lab Results:   Additional Information SSN:[id number]  Kari JONETTA Daisy, LCSW

## 2023-12-31 NOTE — ED Provider Notes (Addendum)
 [place] EMERGENCY DEPARTMENT AT [facility] Provider Note   CSN: [id number] Arrival date & time: 12/31/23  9847     Patient presents with: Knee Problem (BIBA from home w/ c/o left knee swelling and pain.  Pt was due for Total left knee replacement approx 1 month ago, but wasn't able to have completed due to low hemoglobin.  Pt due to have recollected on 12/15.  PT denies any recent falls or trauma.  Admits to increase in activity over last few days.  PT and spouse attempted to get her out of chair since 1700 per EMS.  Pt also did not take Lasix as presribed)   Roselia Snipe is a 78 y.o. female.   The history is provided by the patient.   She has history of hypertension, breast cancer, asthma, GERD, paroxysmal atrial fibrillation anticoagulated on rivaroxaban , heart failure with preserved ejection fraction and comes in because of pain and swelling in her left knee.  She was scheduled to have knee replacement done 1 month ago but surgery was delayed because of anemia.  She was doing reasonably well until yesterday when she started having increased pain and swelling in her knee.  She denies any trauma or unusual activity.  She did take tramadol for pain.  Today pain got worse and she was unable to pull herself up even with a walker.  She was brought in by ambulance.    Prior to Admission medications   Medication Sig Start Date End Date Taking? Authorizing Provider  acetaminophen  (TYLENOL ) 500 MG tablet Take 1,000 mg by mouth every 6 (six) hours as needed for moderate pain (pain score 4-6).    [provider]  albuterol  (PROVENTIL ) (5 MG/ML) 0.5% nebulizer solution Take 0.5 mLs (2.5 mg total) by nebulization every 6 (six) hours as needed for wheezing or shortness of breath. 02/23/18   Cur[place], Adam, DO  amLODipine  (NORVASC ) 10 MG tablet Take 5 mg by mouth every evening.    [provider]  Ascorbic Acid  (VITAMIN C  WITH ROSE HIPS PO) Take 1 tablet by mouth  daily.    [provider]  betamethasone  dipropionate 0.05 % cream Apply 1 Application topically 2 (two) times daily as needed (psoriasis).    [provider]  Calcium  Carbonate-Vitamin D  (CALTRATE 600+D PO) Take 1 tablet by mouth in the morning.    [provider]  cetirizine (ZYRTEC) 10 MG tablet Take 10 mg by mouth in the morning.    [provider]  cholecalciferol (VITAMIN D3) 25 MCG (1000 UNIT) tablet Take 1,000 Units by mouth daily.    [provider]  esomeprazole (NEXIUM) 40 MG capsule Take 40 mg by mouth 2 (two) times daily before a meal.    [provider]  Ferrous Sulfate  (IRON  PO) Take 1 tablet by mouth 2 (two) times a week.    [provider]  Fluticasone -Salmeterol (ADVAIR) 250-50 MCG/DOSE AEPB Inhale 1 puff into the lungs in the morning and at bedtime.    [provider]  loperamide  (IMODIUM ) 2 MG capsule Take 1 capsule (2 mg total) by mouth 2 (two) times daily as needed for diarrhea or loose stools. Patient not taking: Reported on 12/07/2023 04/06/23   Christopher Savannah, [name]  Multiple Vitamin (MULTIVITAMIN) tablet Take 1 tablet by mouth in the morning.    [provider]  nebivolol  (BYSTOLIC ) 5 MG tablet Take 1 tablet by mouth once daily 11/28/22   Fernande Elspeth BROCKS, MD  oxyCODONE  (OXY IR/ROXICODONE )  5 MG immediate release tablet Take 0.5-1 tablets (2.5-5 mg total) by mouth every 6 (six) hours as needed for severe pain. Patient not taking: Reported on 12/07/2023 08/08/22   Aron Shoulders, MD  potassium chloride  SA (KLOR-CON ) 20 MEQ tablet Take 20 mEq by mouth 3 (three) times daily.    [provider]  rivaroxaban  (XARELTO ) 20 MG TABS tablet TAKE 1 TABLET BY MOUTH ONCE DAILY WITH SUPPER 12/17/23   Almetta Donnice LABOR, MD  rosuvastatin  (CRESTOR ) 5 MG tablet Take 5 mg by mouth every evening.    [provider]  simethicone  (MYLICON) 125 MG chewable tablet Chew 125 mg by mouth every 6 (six) hours as  needed for flatulence.    [provider]  SSD 1 % cream Apply 1 Application topically 2 (two) times daily as needed (chafing). 07/25/22   [provider]  telmisartan (MICARDIS) 40 MG tablet Take 40 mg by mouth in the morning.    [provider]  torsemide  (DEMADEX ) 20 MG tablet Take 1 tablet (20 mg total) by mouth daily. 12/11/23   Almetta Donnice LABOR, MD  traMADol (ULTRAM) 50 MG tablet Take 50 mg by mouth every 6 (six) hours as needed for severe pain (pain score 7-10).    [provider]  triamcinolone  cream (KENALOG) 0.1 % Apply 1 application  topically 2 (two) times daily as needed (itching.). 07/05/20   [provider]    Allergies: Ephedrine  and Cortisone    Review of Systems  All other systems reviewed and are negative.   Updated Vital Signs BP 90/72 (BP Location: Right Arm)   Pulse 77   Temp 98.7 F (37.1 C) (Oral)   Resp 18   SpO2 100%   Physical Exam Vitals and nursing note reviewed.   78 year old female, resting comfortably and in no acute distress. Vital signs are normal. Oxygen  saturation is 100%, which is normal. Head is normocephalic and atraumatic. PERRLA, EOMI.  Lungs are clear without rales, wheezes, or rhonchi. Heart has regular rate and rhythm without murmur. Extremities have 2-3+ edema.  There is a small to moderate effusion present in the left knee with moderate soft tissue swelling around the knee.  The knee is markedly tender to palpation virtually anywhere and there is pain with virtually any movement.  There is no instability on valgus or varus stress.  Lachman test is grossly negative but suboptimal because of pain with any movement.  Unable to perform McMurray's test. Skin is warm and dry without rash. Neurologic: Awake and alert.  No focal findings.   Radiology: DG Knee Complete 4 Views Left Result Date: 12/31/2023 EXAM: 4 VIEW(S) XRAY OF THE LEFT KNEE 12/31/2023 02:49:00 AM COMPARISON: None available.  CLINICAL HISTORY: pain. edema. FINDINGS: BONES AND JOINTS: No acute fracture. No malalignment. Subchondral cyst formation. Moderate to severe tricompartmental degenerative changes with joint space narrowing and osteophyte formation. No significant joint effusion. SOFT TISSUES: Vascular calcifications. IMPRESSION: 1. No acute findings. Electronically signed by: Morgane Naveau MD 12/31/2023 02:59 AM EST RP Workstation: [id number]     Procedures   Medications Ordered in the ED  predniSONE  (DELTASONE ) tablet 60 mg (has no administration in time range)  oxyCODONE -acetaminophen  (PERCOCET/ROXICET) 5-325 MG per tablet 1 tablet (1 tablet Oral Given 12/31/23 0336)                                    Medical Decision Making Amount  and/or Complexity of Data Reviewed Radiology: ordered.  Risk Prescription drug management.   Acute left knee pain without trauma.  In the setting of chronic anticoagulation need to consider spontaneous hemarthrosis versus exacerbation of known primary osteoarthritis.  No warmth to suggest gout or pseudogout.  I have ordered a dose of oxycodone -acetaminophen  for pain and I ordered a knee x-ray.  Knee x-ray shows no significant effusion, severe 3 compartment arthritis, no fracture.  I have independently viewed the images, and agree with the radiologist's interpretation.  She had reasonably good pain relief with oxycodone  and acetaminophen .  Patient tells me that she had a similar flareup about a year ago and a course of prednisone  helped a lot.  Have ordered a dose of prednisone  and I am discharging her with prescription for 5-day course of prednisone  and also prescription for small number of oxycodone  tablets.  She is advised to ice her knee aggressively and use over-the-counter acetaminophen  as needed for pain.  Avoid NSAIDs because of anticoagulated state.  Add oxycodone  as needed for pain not relieved by ice and acetaminophen .  Follow-up with her orthopedic doctor.      Final diagnoses:  Pain in joint of left knee  Chronic anticoagulation    ED Discharge Orders          Ordered    oxyCODONE  (OXY IR/ROXICODONE ) 5 MG immediate release tablet  Every 6 hours PRN        12/31/23 0428    predniSONE  (DELTASONE ) 50 MG tablet  Daily        12/31/23 0428               Raford Lenis, MD 12/31/23 [phone]  Patient was unable to stand even with assistance.  The only person she has at home to help her is her husband who just had hip replacement surgery.  She is not safe for discharge.  I have requested physical therapy and Occupational Therapy consultation as well as transition of care consultation.   Raford Lenis, MD 12/31/23 [phone]

## 2023-12-31 NOTE — ED Notes (Signed)
 Began to assist patient from bed to bedside commode after patient declined use of bedpan or purewick.  Began at approx 0542.  Required 2-4 person assist.  PT unable to bear weight, tearful, c/o increased pain and fear of being home alone with spouse whom is 78 years old and has hip replacement.  Finished getting patient back into bed after use of bedside commode with the assistance of x4 ER personnel as well as the use of the [employer] at approx [phone].  Notified Dr. Raford that patient may need to be reassessed due to pending disposition.  Dr. Kelvin went to bedside to notify patient of change in plan of care.  [facility] called to cancel transportation home.

## 2023-12-31 NOTE — ED Triage Notes (Signed)
 BIBA from home w/ c/o left knee swelling and pain.  Pt was due for Total left knee replacement approx 1 month ago, but wasn't able to have completed due to low hemoglobin.  Pt due to have recollected on 12/15.  PT denies any recent falls or trauma.  Admits to increase in activity over last few days.  PT and spouse attempted to get her out of chair since 1700 per EMS.  Pt also did not take Lasix as presribed

## 2024-01-02 DIAGNOSIS — M1712 Unilateral primary osteoarthritis, left knee: Secondary | ICD-10-CM | POA: Diagnosis not present

## 2024-01-02 DIAGNOSIS — R2689 Other abnormalities of gait and mobility: Secondary | ICD-10-CM | POA: Diagnosis not present

## 2024-01-02 DIAGNOSIS — M6281 Muscle weakness (generalized): Secondary | ICD-10-CM | POA: Diagnosis not present

## 2024-01-02 DIAGNOSIS — F419 Anxiety disorder, unspecified: Secondary | ICD-10-CM | POA: Diagnosis not present

## 2024-01-02 DIAGNOSIS — J45909 Unspecified asthma, uncomplicated: Secondary | ICD-10-CM | POA: Diagnosis not present

## 2024-01-02 DIAGNOSIS — I480 Paroxysmal atrial fibrillation: Secondary | ICD-10-CM | POA: Diagnosis not present

## 2024-01-02 DIAGNOSIS — I5030 Unspecified diastolic (congestive) heart failure: Secondary | ICD-10-CM | POA: Diagnosis not present

## 2024-01-07 ENCOUNTER — Inpatient Hospital Stay: Admitting: Internal Medicine

## 2024-01-07 ENCOUNTER — Inpatient Hospital Stay: Attending: Internal Medicine

## 2024-01-07 VITALS — BP 131/69 | HR 71 | Temp 97.8°F | Resp 17 | Ht 66.5 in | Wt 242.0 lb

## 2024-01-07 DIAGNOSIS — D649 Anemia, unspecified: Secondary | ICD-10-CM | POA: Insufficient documentation

## 2024-01-07 DIAGNOSIS — D72819 Decreased white blood cell count, unspecified: Secondary | ICD-10-CM | POA: Diagnosis present

## 2024-01-07 DIAGNOSIS — D539 Nutritional anemia, unspecified: Secondary | ICD-10-CM

## 2024-01-07 LAB — CBC WITH DIFFERENTIAL (CANCER CENTER ONLY)
Abs Immature Granulocytes: 0.03 K/uL (ref 0.00–0.07)
Basophils Absolute: 0 K/uL (ref 0.0–0.1)
Basophils Relative: 0 %
Eosinophils Absolute: 0 K/uL (ref 0.0–0.5)
Eosinophils Relative: 0 %
HCT: 34.6 % — ABNORMAL LOW (ref 36.0–46.0)
Hemoglobin: 11.2 g/dL — ABNORMAL LOW (ref 12.0–15.0)
Immature Granulocytes: 0 %
Lymphocytes Relative: 10 %
Lymphs Abs: 0.9 K/uL (ref 0.7–4.0)
MCH: 26.7 pg (ref 26.0–34.0)
MCHC: 32.4 g/dL (ref 30.0–36.0)
MCV: 82.4 fL (ref 80.0–100.0)
Monocytes Absolute: 0.5 K/uL (ref 0.1–1.0)
Monocytes Relative: 6 %
Neutro Abs: 7.5 K/uL (ref 1.7–7.7)
Neutrophils Relative %: 84 %
Platelet Count: 368 K/uL (ref 150–400)
RBC: 4.2 MIL/uL (ref 3.87–5.11)
RDW: 16.1 % — ABNORMAL HIGH (ref 11.5–15.5)
WBC Count: 8.9 K/uL (ref 4.0–10.5)
nRBC: 0 % (ref 0.0–0.2)

## 2024-01-07 LAB — VITAMIN B12: Vitamin B-12: 3628 pg/mL — ABNORMAL HIGH (ref 180–914)

## 2024-01-07 NOTE — Progress Notes (Signed)
 [facility] [phone]   [phone]  OFFICE PROGRESS NOTE  Bridget Santos, MD [address] [place] [address]  DIAGNOSIS:  Leukocytopenia and mild anemia.  Her leukocytopenia is likely drug-induced from her current treatment rosuvastatin .  She also has mild anemia of chronic disease.    PRIOR THERAPY:None  CURRENT THERAPY: Observation  INTERVAL HISTORY: Bridget Cortez 78 y.o. female returns to the clinic today for follow-up visit accompanied by her husband.  Discussed the use of AI scribe software for clinical note transcription with the patient, who gave verbal consent to proceed.  History of Present Illness Bridget Cortez is a 78 year old female with leukopenia and mild anemia who presents for hematology follow-up to assess cytopenias and clearance for planned orthopedic surgery.  Leukopenia and mild anemia have been monitored over several months. White blood cell count was 3.1 in October and 3.4 in November, with normalization to 8.9 at today's visit. Hemoglobin has improved from 9.8 to 11.2. Platelet count remains stable and within normal limits. She is not receiving transfusions. She discontinued B12 supplementation as previously instructed and continues iron  supplementation twice weekly. She adheres to dietary recommendations, including regular intake of iron -rich foods such as beets, turnip greens, spinach, orange juice, and red meat, as well as fish, chicken, or turkey. She inquired about her RDW and hematocrit, which are 34 and slightly above normal, respectively.  She is currently in rehabilitation for severe left knee pain due to advanced osteoarthritis with bone-on-bone changes confirmed by imaging. She experiences significant pain and impaired mobility, requiring assistance and resulting in recent weight loss. She is awaiting possible orthopedic surgery and is concerned about the timing of the procedure and  whether her improved hemoglobin is sufficient for surgical clearance.   MEDICAL HISTORY: Past Medical History:  Diagnosis Date   Abnormal Pap smear 2006   vaginal medicine according to patient   Allergy    Arthritis    Asthma    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 eye    cataract removed.   Chiari malformation    Complication of anesthesia    woke up during left breast surgery age 8   Diverticulosis    Dysrhythmia    SVT   GERD (gastroesophageal reflux disease)    H/O osteopenia    08/2006   H/O varicella    Heart murmur    Hypertension    Monilial vaginitis 07/2007   Multiple allergies    Ovarian cyst    Personal history of colonic adenomas 03/13/2012   Personal history of radiation therapy 1990   Pneumonia 07/2014   VIN III (vulvar intraepithelial neoplasia III) 03/2009   Yeast infection     ALLERGIES:  is allergic to ephedrine  and cortisone.  MEDICATIONS:  Current Outpatient Medications  Medication Sig Dispense Refill   acetaminophen  (TYLENOL ) 500 MG tablet Take 1,000 mg by mouth every 6 (six) hours as needed (for pain or headaches).     albuterol  (PROVENTIL ) (5 MG/ML) 0.5% nebulizer solution Take 0.5 mLs (2.5 mg total) by nebulization every 6 (six) hours as needed for wheezing or shortness of breath. 20 mL 12   amLODipine  (NORVASC ) 10 MG tablet Take 5 mg by mouth at bedtime.     Ascorbic Acid  (VITAMIN C  WITH ROSE HIPS PO) Take 1 tablet by mouth daily.  betamethasone  dipropionate 0.05 % cream Apply 1 Application topically 2 (two) times daily as needed (for psoriasis and keratosis pilaris - under the breasts or on the stomach).     Calcium  Carbonate-Vitamin D  (CALTRATE 600+D PO) Take 1 tablet by mouth in the morning.     cetirizine (ZYRTEC) 10 MG tablet Take 10 mg by mouth in the morning.     cholecalciferol (VITAMIN D3) 25 MCG (1000 UNIT) tablet Take 1,000 Units  by mouth daily.     esomeprazole (NEXIUM) 40 MG capsule Take 40 mg by mouth 2 (two) times daily before a meal.     ferrous sulfate  325 (65 FE) MG tablet Take 325 mg by mouth See admin instructions. Take 325 mg by mouth on Wednesdays and Fridays     Fluticasone -Salmeterol (ADVAIR) 250-50 MCG/DOSE AEPB Inhale 1 puff into the lungs in the morning and at bedtime.     loperamide  (IMODIUM ) 2 MG capsule Take 1 capsule (2 mg total) by mouth 2 (two) times daily as needed for diarrhea or loose stools. 14 capsule 0   Multiple Vitamin (MULTIVITAMIN) tablet Take 1 tablet by mouth See admin instructions. Solgar Formula VM-75 Multivitamin with Chelated Minerals tablets - Take 1 tablet by mouth with breakfast     nebivolol  (BYSTOLIC ) 5 MG tablet Take 1 tablet by mouth once daily 90 tablet 2   oxyCODONE  (OXY IR/ROXICODONE ) 5 MG immediate release tablet Take 0.5-1 tablets (2.5-5 mg total) by mouth every 6 (six) hours as needed for severe pain (pain score 7-10). (Patient not taking: Reported on 12/31/2023) 15 tablet 0   potassium chloride  SA (KLOR-CON ) 20 MEQ tablet Take 20 mEq by mouth 3 (three) times daily.     predniSONE  (DELTASONE ) 50 MG tablet Take 1 tablet (50 mg total) by mouth daily. 5 tablet 0   rivaroxaban  (XARELTO ) 20 MG TABS tablet TAKE 1 TABLET BY MOUTH ONCE DAILY WITH SUPPER 90 tablet 1   rosuvastatin  (CRESTOR ) 5 MG tablet Take 5 mg by mouth at bedtime.     simethicone  (MYLICON) 125 MG chewable tablet Chew 125 mg by mouth every 6 (six) hours as needed for flatulence.     sodium chloride  (OCEAN) 0.65 % SOLN nasal spray Place 1 spray into both nostrils as needed for congestion.     SSD 1 % cream Apply 1 Application topically 2 (two) times daily as needed (for chafing OR for psoriasis and keratosis pilaris - under the breasts or on the stomach).     SYSTANE HYDRATION PF 0.4-0.3 % SOLN Place 1 drop into both eyes 3 (three) times daily as needed (for dryness or inflammation).     telmisartan (MICARDIS) 40 MG  tablet Take 40 mg by mouth in the morning.     torsemide  (DEMADEX ) 20 MG tablet Take 1 tablet (20 mg total) by mouth daily. (Patient taking differently: Take 20 mg by mouth See admin instructions. Take 20 mg by mouth at 5 PM) 90 tablet 3   traMADol (ULTRAM) 50 MG tablet Take 50-100 mg by mouth 2 (two) times daily as needed (for pain).     triamcinolone  cream (KENALOG) 0.1 % Apply 1 application  topically 2 (two) times daily as needed (for psoriasis and keratosis pilaris - under the breasts or on the stomach).     No current facility-administered medications for this visit.    SURGICAL HISTORY:  Past Surgical History:  Procedure Laterality Date   ABDOMINAL HYSTERECTOMY     ABDOMINAL SURGERY     BREAST DUCTAL  SYSTEM EXCISION Right 08/08/2022   Procedure: RIGHT BREAST CENTRAL DUCT EXCISION;  Surgeon: Aron Shoulders, MD;  Location: [facility] OR;  Service: General;  Laterality: Right;   BREAST EXCISIONAL BIOPSY Right    benign   BREAST LUMPECTOMY Left 1990   BREAST SURGERY     Lumpectomy, left breast   CARDIOVERSION N/A 07/28/2020   Procedure: CARDIOVERSION;  Surgeon: Kate Lonni CROME, MD;  Location: [facility] ENDOSCOPY;  Service: Cardiovascular;  Laterality: N/A;   CATARACT EXTRACTION Left 2018   COLONOSCOPY     COLONOSCOPY W/ BIOPSIES     EYE SURGERY     HERNIA REPAIR     KNEE SURGERY     right   lt. eye aneurysm surgery Left 2017   MOUTH SURGERY  2019   rt. side gum surgery  Lt. done 3 years ago   RADIOACTIVE SEED GUIDED EXCISIONAL BREAST BIOPSY Right 07/09/2018   Procedure: RADIOACTIVE SEED GUIDED EXCISIONAL RIGHT  BREAST BIOPSY;  Surgeon: Aron Shoulders, MD;  Location: [facility] OR;  Service: General;  Laterality: Right;   TOTAL KNEE ARTHROPLASTY Right 09/09/2019   Procedure: RIGHT TOTAL KNEE ARTHROPLASTY;  Surgeon: Sheril Coy, MD;  Location: [facility] ORS;  Service: Orthopedics;  Laterality: Right;    REVIEW OF SYSTEMS:  A comprehensive review of systems was negative except for: Constitutional:  positive for fatigue   PHYSICAL EXAMINATION: General appearance: alert, cooperative, fatigued, and no distress Head: Normocephalic, without obvious abnormality, atraumatic Neck: no adenopathy, no JVD, supple, symmetrical, trachea midline, and thyroid  not enlarged, symmetric, no tenderness/mass/nodules Lymph nodes: Cervical, supraclavicular, and axillary nodes normal. Resp: clear to auscultation bilaterally Back: symmetric, no curvature. ROM normal. No CVA tenderness. Cardio: regular rate and rhythm, S1, S2 normal, no murmur, click, rub or gallop GI: soft, non-tender; bowel sounds normal; no masses,  no organomegaly Extremities: extremities normal, atraumatic, no cyanosis or edema  ECOG PERFORMANCE STATUS: 1 - Symptomatic but completely ambulatory  Blood pressure 131/69, pulse 71, temperature 97.8 F (36.6 C), temperature source Temporal, resp. rate 17, height 5' 6.5 (1.689 m), weight 242 lb (109.8 kg), SpO2 100%.  LABORATORY DATA: Lab Results  Component Value Date   WBC 8.9 01/07/2024   HGB 11.2 (L) 01/07/2024   HCT 34.6 (L) 01/07/2024   MCV 82.4 01/07/2024   PLT 368 01/07/2024      Chemistry      Component Value Date/Time   NA 140 12/07/2023 1029   NA 140 03/30/2022 1145   K 3.7 12/07/2023 1029   CL 105 12/07/2023 1029   CO2 28 12/07/2023 1029   BUN 14 12/07/2023 1029   BUN 15 03/30/2022 1145   CREATININE 0.81 12/07/2023 1029      Component Value Date/Time   CALCIUM  9.1 12/07/2023 1029   ALKPHOS 84 12/07/2023 1029   AST 23 12/07/2023 1029   ALT 13 12/07/2023 1029   BILITOT 0.3 12/07/2023 1029       RADIOGRAPHIC STUDIES: DG Knee Complete 4 Views Left Result Date: 12/31/2023 EXAM: 4 VIEW(S) XRAY OF THE LEFT KNEE 12/31/2023 02:49:00 AM COMPARISON: None available. CLINICAL HISTORY: pain. edema. FINDINGS: BONES AND JOINTS: No acute fracture. No malalignment. Subchondral cyst formation. Moderate to severe tricompartmental degenerative changes with joint space narrowing  and osteophyte formation. No significant joint effusion. SOFT TISSUES: Vascular calcifications. IMPRESSION: 1. No acute findings. Electronically signed by: Morgane Naveau MD 12/31/2023 02:59 AM EST RP Workstation: [id number]    ASSESSMENT AND PLAN: This is a very pleasant 78 years old African-American female with persistent leukocytopenia  and anemia of unclear etiology.  It was initially thought to be drug-induced but there is no improvement in her condition after discontinuation of the suspicious culprit medication.. The patient underwent a bone marrow biopsy and aspirate.  Her biopsy showed no concerning bone marrow abnormality except for a slight increase of plasma cells but representing 4%. CBC today showed hemoglobin up to 11.2 Assessment and Plan Assessment & Plan Leukocytopenia Leukocytopenia has resolved, with normalization of white blood cell count to 8.9. No ongoing cytopenias or need for intervention. - Reviewed normalization of white blood cell count with her and family. - Planned follow-up in three months.  Mild anemia Mild anemia has improved, with hemoglobin 11.2 and hematocrit 34. She is clinically stable and her hemoglobin is sufficient for planned orthopedic surgery. No transfusion indicated. She has discontinued B12 supplementation and continues iron  supplementation. Dietary intake is appropriate. - Reviewed improvement in hemoglobin and hematocrit with her and family. - Advised that current hemoglobin is adequate for planned orthopedic surgery and no transfusion is needed. - Instructed nurse to send updated laboratory results to orthopedic surgeon for surgical planning. - Provided dietary guidance regarding red meat intake (recommended twice weekly) and continued iron -rich foods. - Confirmed discontinuation of B12 supplementation. - Planned follow-up in three months. She was advised to call immediately if she has any concerning symptoms in the interval. The patient voices  understanding of current disease status and treatment options and is in agreement with the current care plan. The total time spent in the appointment was 20 minutes including review of chart and various tests results, discussions about plan of care and coordination of care plan .  All questions were answered. The patient knows to call the clinic with any problems, questions or concerns. We can certainly see the patient much sooner if necessary.   Disclaimer: This note was dictated with voice recognition software. Similar sounding words can inadvertently be transcribed and may not be corrected upon review.

## 2024-01-07 NOTE — Progress Notes (Signed)
 CBC/diff faxed to Dr Allusion's office.

## 2024-01-08 ENCOUNTER — Ambulatory Visit: Payer: Self-pay

## 2024-01-08 ENCOUNTER — Telehealth: Payer: Self-pay

## 2024-01-08 LAB — COPPER, SERUM: Copper: 113 ug/dL (ref 80–158)

## 2024-01-09 NOTE — Telephone Encounter (Signed)
 Per Cassie, PA pt was instructed not to take B12.  Pt verbalized understanding.

## 2024-01-10 NOTE — Progress Notes (Signed)
 Faxed lab results to Dr. Daralyn office with confirmation at [phone].

## 2024-01-14 NOTE — Telephone Encounter (Signed)
 SABRA

## 2024-01-21 ENCOUNTER — Other Ambulatory Visit: Payer: Self-pay | Admitting: *Deleted

## 2024-01-21 DIAGNOSIS — I10 Essential (primary) hypertension: Secondary | ICD-10-CM

## 2024-01-21 NOTE — Patient Outreach (Signed)
 [facility]  01/21/2024  Chisa Kushner [facility] 1945-07-20 [id number]   Bridget Cortez previously utilized [facility] waiver for admission to [facility]. Per [facility] Bridget Cortez discharged from [facility] on 01/16/24 with home health services.   Collaboration with Jazmin, [facility] social worker. Bridget Cortez returned home with [facility] with PT/OT services.   Will refer to [facility] CCM services. Bridget Cortez has medical history of PAF, leukocytopenia, anemia, HF, HTN, breast cancer, knee osteoarthritis.   Pablo Hurst, MSN, RN, BSN [place]  [facility] RN Care Manager Direct Dial: [phone]

## 2024-01-30 NOTE — Progress Notes (Signed)
 COVID Vaccine received:  []  No [x]  Yes Date of any COVID positive Test in last 90 days:  none  PCP - Searcy Overcast, MD  Cardiologist - Lonni Cash, MD Lum Louis, NP cardiac clearance in 11-20-23 Epic note EP- Donnice Primus, MD  (LOV 10-19-23) Oncology- Sherrod Sherrod, MD clearance in 01-07-24 Epic note  Chest x-ray - 11-26-2022  2v  epic EKG - 09-27-2023  Epic  Stress Test -  ECHO - 10-30-2023  Epic  Cardiac Cath -  CT Coronary Calcium  score:   Pacemaker / ICD device [x]  No []  Yes   Spinal Cord Stimulator:[x]  No []  Yes       History of Sleep Apnea? [x]  No []  Yes   CPAP used?- [x]  No []  Yes    Medication on DOS: Nebivolol , Cetirizine, Esomeprazole, Tramadol/Tylenol , Eye drops and Nasal Sprays, Albuterol  nebulizer  Hold DOS: Telmisartan,Torsemide ,  Patient has: [x]  NO Hx DM   []  Pre-DM   []  DM1  []   DM2 Does the patient monitor blood sugar?   [x]  N/A   []  No []  Yes   Blood Thinner / Instructions:  XARELTO   hold x 72 hrs Aspirin  Instructions:  none  Activity level: Able to walk up 2 flights of stairs without becoming significantly short of breath or having chest pain?   [x]   Yes   []  No,  would have:  Patient can perform ADLs without assistance.  [x]   Yes  []  No   Comments: LEFT ARM RESTRICTION d/t past Breast cancer surgeries  Anesthesia review: HFpEF, SVT, A.fib (cardioversion 07-28-2020), heart murmur,  HTN, chiari malformation, Asthma, anemia,  hx Leukocytopenia, woke up during breast surgery (over 50 years ago) GERD,   Patient denies any S&S of respiratory illness or Covid - no shortness of breath, fever, cough or chest pain at PAT appointment.  Patient verbalized understanding and agreement to the Pre-Surgical Instructions that were given to them at this PAT appointment. Patient was also educated of the need to review these PAT instructions again prior to her surgery.I reviewed the appropriate phone numbers to call if they have any and questions or concerns.

## 2024-01-30 NOTE — Patient Instructions (Addendum)
 SURGICAL WAITING ROOM VISITATION Patients having surgery or a procedure may have no more than 2 support people in the waiting area - these visitors may rotate in the visitor waiting room.   If the patient needs to stay at the hospital during part of their recovery, the visitor guidelines for inpatient rooms apply.  PRE-OP VISITATION  Pre-op nurse will coordinate an appropriate time for 1 support person to accompany the patient in pre-op.  This support person may not rotate.  This visitor will be contacted when the time is appropriate for the visitor to come back in the pre-op area.  Please refer to the [facility] website for the visitor guidelines for Inpatients (after your surgery is over and you are in a regular room).  Temporary Visitor Restrictions  Children ages 28 and under will not be able to visit patients in [facility] under most circumstances. Visitation is not restricted outside of hospitals unless noted otherwise in the [facility] and Location Specific Visitation Guidelines at :      [email or web address] Visitors with respiratory illnesses are discouraged from visiting and should remain at home.  You are not required to quarantine at this time prior to your surgery. However, you must do this: Hand Hygiene often Do NOT share personal items Notify your provider if you are in close contact with someone who has COVID or you develop fever 100.4 or greater, new onset of sneezing, cough, sore throat, shortness of breath or body aches.  If you test positive for Covid or have been in contact with anyone that has tested positive in the last 10 days please notify you surgeon.    Your procedure is scheduled on:  Monday  02-11-24  Report to [facility] Main Entrance: [place] entrance where the [employer] is available.   Report to admitting at: 2:15    PM  Call this number if you have any questions or problems the morning of surgery [phone]  Do not eat food  after Midnight the night prior to your surgery/procedure.  After Midnight you may have the following liquids until   1:45 PM DAY OF SURGERY  Clear Liquid Diet Water  Black Coffee (sugar ok, NO MILK/CREAM OR CREAMERS)  Tea (sugar ok, NO MILK/CREAM OR CREAMERS) regular and decaf                             Plain Jell-O  with no fruit (NO RED)                                           Fruit ices (not with fruit pulp, NO RED)                                     Popsicles (NO RED)                                                                  Juice: NO CITRUS JUICES: only apple, WHITE grape, WHITE cranberry Sports drinks like Gatorade or Powerade (NO RED)  The day of surgery:  Drink ONE (1) Pre-Surgery Clear Ensure at  1:45 PM the morning of surgery. Drink in one sitting. Do not sip.  This drink was given to you during your hospital pre-op appointment visit. Nothing else to drink after completing the Pre-Surgery Clear Ensure : No candy, chewing gum or throat lozenges.    FOLLOW ANY ADDITIONAL PRE OP INSTRUCTIONS YOU RECEIVED FROM YOUR SURGEON'S OFFICE!!!   Oral Hygiene is also important to reduce your risk of infection.        Remember - BRUSH YOUR TEETH THE MORNING OF SURGERY WITH YOUR REGULAR TOOTHPASTE  Do NOT smoke after Midnight the night before surgery.  STOP TAKING all Vitamins, Herbs and supplements 1 week before your surgery.   XARELTO - Stop taking 72 hours before your surgery. Last dose will be taken on THURSDAY 02-07-2024  Take ONLY these medicines the morning of surgery with A SIP OF WATER : Nebivolol , Cetirizine, Esomeprazole, You may take EITHER Tramadol or Tylenol  if needed for pain. You may use your Eye drops and Nasal Sprays. You may use your Albuterol  nebulizer if needed.   DO NOT TAKE Telmisartan,Torsemide , on the day of your surgery.                   You may not have any metal on your body including hair pins, jewelry, and body piercing  Do not  wear make-up, lotions, powders, perfumes or deodorant  Do not wear nail polish including gel and S&S, artificial / acrylic nails, or any other type of covering on natural nails including finger and toenails. If you have artificial nails, gel coating, etc., that needs to be removed by a nail salon, Please have this removed prior to surgery. Not doing so may mean that your surgery could be cancelled or delayed if the Surgeon or anesthesia staff feels like they are unable to monitor you safely.   Do not shave 48 hours prior to surgery to avoid nicks in your skin which may contribute to postoperative infections.   Contacts, Hearing Aids, dentures or bridgework may not be worn into surgery. DENTURES WILL BE REMOVED PRIOR TO SURGERY PLEASE DO NOT APPLY Poly grip OR ADHESIVES!!!  You may bring a small overnight bag with you on the day of surgery, only pack items that are not valuable. [facility] IS NOT RESPONSIBLE   FOR VALUABLES THAT ARE LOST OR STOLEN.   Do not bring your home medications to the hospital. The Pharmacy will dispense medications listed on your medication list to you during your admission in the Hospital.  Please read over the following fact sheets you were given: IF YOU HAVE QUESTIONS ABOUT YOUR PRE-OP INSTRUCTIONS, PLEASE CALL [phone].      Pre-operative 4 CHG Bath Instructions   You can play a key role in reducing the risk of infection after surgery. Your skin needs to be as free of germs as possible. You can reduce the number of germs on your skin by washing with CHG (chlorhexidine  gluconate) soap before surgery. CHG is an antiseptic soap that kills germs and continues to kill germs even after washing.   DO NOT use if you have an allergy to chlorhexidine /CHG or antibacterial soaps. If your skin becomes reddened or irritated, stop using the CHG and notify one of our RNs at [phone]  Please shower with the CHG soap starting 4 days before surgery using the following  schedule:  THURSDAY  02-07-2024      Do NOT use  CHG soap                                                                                                                      the morning of your                                                                                                                                 surgery.         Please keep in mind the following:  DO NOT shave, including legs and underarms, starting the day of your first shower.   You may shave your face at any point before/day of surgery.  Place clean sheets on your bed the day you start using CHG soap. Use a clean washcloth (not used since being washed) for each shower. DO NOT sleep with pets once you start using the CHG.  CHG Shower Instructions:  If you choose to wash your hair and private area, wash first with your normal shampoo/soap.  After you use shampoo/soap, rinse your hair and body thoroughly to remove shampoo/soap residue.  Turn the water  OFF and apply about 3 tablespoons (45 ml) of CHG soap to a CLEAN washcloth.  Apply CHG soap ONLY FROM YOUR NECK DOWN TO YOUR TOES (washing for 3-5 minutes)  DO NOT use CHG soap on face, private areas, open wounds, or sores.  Pay special attention to the area where your surgery is being performed.  If you are having back surgery, having someone wash your back for you may be helpful. Wait 2 minutes after CHG soap is applied, then you may rinse off the CHG soap.  Pat dry with a clean towel  Put on clean clothes/pajamas   If you choose to wear lotion, please use ONLY the CHG-compatible lotions on the back of this paper.     Additional instructions for the day of surgery: DO NOT APPLY any CHG Soap,  lotions, deodorants, cologne, or perfumes on the day of surgery  Put on clean/comfortable clothes.  Brush your teeth.  Ask your nurse before applying any prescription medications to the skin.   CHG Compatible Lotions   Aveeno Moisturizing lotion  Cetaphil Moisturizing  Cream  Cetaphil Moisturizing Lotion  Clairol Herbal Essence Moisturizing Lotion, Dry Skin  Clairol Herbal Essence Moisturizing Lotion, Extra Dry Skin  Clairol Herbal Essence Moisturizing Lotion, Normal Skin  Curel Age Defying Therapeutic Moisturizing Lotion with Alpha Hydroxy  Curel Extreme  Care Body Lotion  Curel Soothing Hands Moisturizing Hand Lotion  Curel Therapeutic Moisturizing Cream, Fragrance-Free  Curel Therapeutic Moisturizing Lotion, Fragrance-Free  Curel Therapeutic Moisturizing Lotion, Original Formula  Eucerin Daily Replenishing Lotion  Eucerin Dry Skin Therapy Plus Alpha Hydroxy Crme  Eucerin Dry Skin Therapy Plus Alpha Hydroxy Lotion  Eucerin Original Crme  Eucerin Original Lotion  Eucerin Plus Crme Eucerin Plus Lotion  Eucerin TriLipid Replenishing Lotion  Keri Anti-Bacterial Hand Lotion  Keri Deep Conditioning Original Lotion Dry Skin Formula Softly Scented  Keri Deep Conditioning Original Lotion, Fragrance Free Sensitive Skin Formula  Keri Lotion Fast Absorbing Fragrance Free Sensitive Skin Formula  Keri Lotion Fast Absorbing Softly Scented Dry Skin Formula  Keri Original Lotion  Keri Skin Renewal Lotion Keri Silky Smooth Lotion  Keri Silky Smooth Sensitive Skin Lotion  Nivea Body Creamy Conditioning Oil  Nivea Body Extra Enriched Lotion  Nivea Body Original Lotion  Nivea Body Sheer Moisturizing Lotion Nivea Crme  Nivea Skin Firming Lotion  NutraDerm 30 Skin Lotion  NutraDerm Skin Lotion  NutraDerm Therapeutic Skin Cream  NutraDerm Therapeutic Skin Lotion  ProShield Protective Hand Cream  Provon moisturizing lotion   FAILURE TO FOLLOW THESE INSTRUCTIONS MAY RESULT IN THE CANCELLATION OF YOUR SURGERY  PATIENT SIGNATURE_________________________________  NURSE SIGNATURE__________________________________  ________________________________________________________________________        Bridget Cortez    An incentive spirometer is a tool  that can help keep your lungs clear and active. This tool measures how well you are filling your lungs with each breath. Taking long deep breaths may help reverse or decrease the chance of developing breathing (pulmonary) problems (especially infection) following: A long period of time when you are unable to move or be active. BEFORE THE PROCEDURE  If the spirometer includes an indicator to show your best effort, your nurse or respiratory therapist will set it to a desired goal. If possible, sit up straight or lean slightly forward. Try not to slouch. Hold the incentive spirometer in an upright position. INSTRUCTIONS FOR USE  Sit on the edge of your bed if possible, or sit up as far as you can in bed or on a chair. Hold the incentive spirometer in an upright position. Breathe out normally. Place the mouthpiece in your mouth and seal your lips tightly around it. Breathe in slowly and as deeply as possible, raising the piston or the ball toward the top of the column. Hold your breath for 3-5 seconds or for as long as possible. Allow the piston or ball to fall to the bottom of the column. Remove the mouthpiece from your mouth and breathe out normally. Rest for a few seconds and repeat Steps 1 through 7 at least 10 times every 1-2 hours when you are awake. Take your time and take a few normal breaths between deep breaths. The spirometer may include an indicator to show your best effort. Use the indicator as a goal to work toward during each repetition. After each set of 10 deep breaths, practice coughing to be sure your lungs are clear. If you have an incision (the cut made at the time of surgery), support your incision when coughing by placing a pillow or rolled up towels firmly against it. Once you are able to get out of bed, walk around indoors and cough well. You may stop using the incentive spirometer when instructed by your caregiver.  RISKS AND COMPLICATIONS Take your time so you do not get  dizzy or light-headed. If you are in pain, you may  need to take or ask for pain medication before doing incentive spirometry. It is harder to take a deep breath if you are having pain. AFTER USE Rest and breathe slowly and easily. It can be helpful to keep track of a log of your progress. Your caregiver can provide you with a simple table to help with this. If you are using the spirometer at home, follow these instructions: SEEK MEDICAL CARE IF:  You are having difficultly using the spirometer. You have trouble using the spirometer as often as instructed. Your pain medication is not giving enough relief while using the spirometer. You develop fever of 100.5 F (38.1 C) or higher.                                                                                                    SEEK IMMEDIATE MEDICAL CARE IF:  You cough up bloody sputum that had not been present before. You develop fever of 102 F (38.9 C) or greater. You develop worsening pain at or near the incision site. MAKE SURE YOU:  Understand these instructions. Will watch your condition. Will get help right away if you are not doing well or get worse. Document Released: 05/22/2006 Document Revised: 04/03/2011 Document Reviewed: 07/23/2006 [facility] Patient Information 2014 [place], [place].

## 2024-01-31 ENCOUNTER — Encounter (HOSPITAL_COMMUNITY): Payer: Self-pay

## 2024-01-31 ENCOUNTER — Other Ambulatory Visit: Payer: Self-pay

## 2024-01-31 ENCOUNTER — Encounter (HOSPITAL_COMMUNITY)
Admission: RE | Admit: 2024-01-31 | Discharge: 2024-01-31 | Disposition: A | Discharge status: Home / Self Care | Source: Ambulatory Visit | Attending: Orthopedic Surgery | Admitting: Orthopedic Surgery

## 2024-01-31 VITALS — BP 132/80 | HR 72 | Temp 98.6°F | Resp 20 | Ht 67.0 in | Wt 230.0 lb

## 2024-01-31 DIAGNOSIS — J45909 Unspecified asthma, uncomplicated: Secondary | ICD-10-CM | POA: Diagnosis not present

## 2024-01-31 DIAGNOSIS — I5030 Unspecified diastolic (congestive) heart failure: Secondary | ICD-10-CM | POA: Diagnosis not present

## 2024-01-31 DIAGNOSIS — I4819 Other persistent atrial fibrillation: Secondary | ICD-10-CM | POA: Diagnosis not present

## 2024-01-31 DIAGNOSIS — Z923 Personal history of irradiation: Secondary | ICD-10-CM | POA: Insufficient documentation

## 2024-01-31 DIAGNOSIS — M1712 Unilateral primary osteoarthritis, left knee: Secondary | ICD-10-CM | POA: Diagnosis not present

## 2024-01-31 DIAGNOSIS — Z01812 Encounter for preprocedural laboratory examination: Secondary | ICD-10-CM | POA: Diagnosis present

## 2024-01-31 DIAGNOSIS — Z7901 Long term (current) use of anticoagulants: Secondary | ICD-10-CM | POA: Insufficient documentation

## 2024-01-31 DIAGNOSIS — Z853 Personal history of malignant neoplasm of breast: Secondary | ICD-10-CM | POA: Diagnosis not present

## 2024-01-31 DIAGNOSIS — I110 Hypertensive heart disease with heart failure: Secondary | ICD-10-CM | POA: Diagnosis not present

## 2024-01-31 DIAGNOSIS — K219 Gastro-esophageal reflux disease without esophagitis: Secondary | ICD-10-CM | POA: Diagnosis not present

## 2024-01-31 DIAGNOSIS — Z87891 Personal history of nicotine dependence: Secondary | ICD-10-CM | POA: Insufficient documentation

## 2024-01-31 DIAGNOSIS — Z01818 Encounter for other preprocedural examination: Secondary | ICD-10-CM

## 2024-01-31 LAB — SURGICAL PCR SCREEN
MRSA, PCR: NEGATIVE
Staphylococcus aureus: NEGATIVE

## 2024-01-31 LAB — CBC
HCT: 33.3 % — ABNORMAL LOW (ref 36.0–46.0)
Hemoglobin: 10.6 g/dL — ABNORMAL LOW (ref 12.0–15.0)
MCH: 27.7 pg (ref 26.0–34.0)
MCHC: 31.8 g/dL (ref 30.0–36.0)
MCV: 86.9 fL (ref 80.0–100.0)
Platelets: 213 K/uL (ref 150–400)
RBC: 3.83 MIL/uL — ABNORMAL LOW (ref 3.87–5.11)
RDW: 17.7 % — ABNORMAL HIGH (ref 11.5–15.5)
WBC: 2.5 K/uL — ABNORMAL LOW (ref 4.0–10.5)
nRBC: 0 % (ref 0.0–0.2)

## 2024-01-31 LAB — BASIC METABOLIC PANEL WITH GFR
Anion gap: 10 (ref 5–15)
BUN: 11 mg/dL (ref 8–23)
CO2: 23 mmol/L (ref 22–32)
Calcium: 9.4 mg/dL (ref 8.9–10.3)
Chloride: 107 mmol/L (ref 98–111)
Creatinine, Ser: 0.87 mg/dL (ref 0.44–1.00)
GFR, Estimated: >60 mL/min
Glucose, Bld: 96 mg/dL (ref 70–99)
Potassium: 4.3 mmol/L (ref 3.5–5.1)
Sodium: 139 mmol/L (ref 135–145)

## 2024-01-31 NOTE — H&P (Addendum)
 TOTAL KNEE ADMISSION H&P  Patient is being admitted for left total knee arthroplasty.  Subjective:  Chief Complaint: Left knee pain.  HPI: Bridget Cortez, 79 y.o. female has a history of pain and functional disability in the left knee due to arthritis and has failed non-surgical conservative treatments for greater than 12 weeks to include NSAID's and/or analgesics, corticosteriod injections, viscosupplementation injections, use of assistive devices, weight reduction as appropriate, and activity modification. Onset of symptoms was gradual, starting several years ago with gradually worsening course since that time. The patient noted no past surgery on the left knee.  Patient currently rates pain in the left knee at 8 out of 10 with activity. Patient has worsening of pain with activity and weight bearing and pain that interferes with activities of daily living. Patient has evidence of tricompartmental bone-on-bone changesby imaging studies. There is no active infection.  Patient Active Problem List   Diagnosis Date Noted   Anemia 12/07/2023   PAF (paroxysmal atrial fibrillation) (HCC)/Flutter 10/04/2022   Leukocytopenia 09/15/2020   Deficiency anemia 09/15/2020   Persistent atrial fibrillation (HCC)    Primary osteoarthritis of right knee 09/09/2019   (HFpEF) heart failure with preserved ejection fraction (HCC) 04/13/2019   SVT (supraventricular tachycardia) 08/04/2017   Wide-complex tachycardia 08/03/2017   Asthma 08/06/2014   Anxiety about health 08/06/2014   Essential hypertension 08/06/2014   History of colonic polyps 03/20/2012   Chiari malformation    Heart murmur    H/O varicella    Yeast infection    Cancer (HCC)    H/O osteopenia    Abnormal Pap smear    Vulvar intraepithelial neoplasia III (VIN III) 04/12/2009   Atrophy of vagina 06/23/2005    Past Medical History:  Diagnosis Date   Abnormal Pap smear 2006   vaginal medicine according to patient   Allergy     Arthritis    Asthma    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 eye    cataract removed.   Chiari malformation    Complication of anesthesia    woke up during left breast surgery age 24   Diverticulosis    Dysrhythmia    SVT   GERD (gastroesophageal reflux disease)    H/O osteopenia    08/2006   H/O varicella    Heart murmur    Hypertension    Monilial vaginitis 07/2007   Multiple allergies    Ovarian cyst    Personal history of colonic adenomas 03/13/2012   Personal history of radiation therapy 1990   Pneumonia 07/2014   VIN III (vulvar intraepithelial neoplasia III) 03/2009   Yeast infection     Past Surgical History:  Procedure Laterality Date   ABDOMINAL HYSTERECTOMY     ABDOMINAL SURGERY     BREAST DUCTAL SYSTEM EXCISION Right 08/08/2022   Procedure: RIGHT BREAST CENTRAL DUCT EXCISION;  Surgeon: Aron Shoulders, MD;  Location: [facility] OR;  Service: General;  Laterality: Right;   BREAST EXCISIONAL BIOPSY Right    benign   BREAST LUMPECTOMY Left 1990   BREAST SURGERY     Lumpectomy, left breast   CARDIOVERSION N/A 07/28/2020   Procedure: CARDIOVERSION;  Surgeon: Kate Lonni CROME, MD;  Location: [facility] ENDOSCOPY;  Service: Cardiovascular;  Laterality: N/A;   CATARACT EXTRACTION Left 2018   COLONOSCOPY     COLONOSCOPY W/ BIOPSIES     EYE SURGERY  HERNIA REPAIR     KNEE SURGERY     right   lt. eye aneurysm surgery Left 2017   MOUTH SURGERY  2019   rt. side gum surgery  Lt. done 3 years ago   RADIOACTIVE SEED GUIDED EXCISIONAL BREAST BIOPSY Right 07/09/2018   Procedure: RADIOACTIVE SEED GUIDED EXCISIONAL RIGHT  BREAST BIOPSY;  Surgeon: Aron Shoulders, MD;  Location: [facility] OR;  Service: General;  Laterality: Right;   TOTAL KNEE ARTHROPLASTY Right 09/09/2019   Procedure: RIGHT TOTAL KNEE ARTHROPLASTY;  Surgeon: Sheril Coy, MD;  Location: [facility] ORS;  Service: Orthopedics;   Laterality: Right;    Prior to Admission medications  Medication Sig Start Date End Date Taking? Authorizing Provider  acetaminophen  (TYLENOL ) 500 MG tablet Take 1,000 mg by mouth every 6 (six) hours as needed for moderate pain (pain score 4-6) or headache.   Yes [provider]  albuterol  (PROVENTIL ) (5 MG/ML) 0.5% nebulizer solution Take 0.5 mLs (2.5 mg total) by nebulization every 6 (six) hours as needed for wheezing or shortness of breath. 02/23/18  Yes Curatolo, Adam, DO  amLODipine  (NORVASC ) 10 MG tablet Take 5 mg by mouth at bedtime.   Yes [provider]  Ascorbic Acid  (VITAMIN C  WITH ROSE HIPS PO) Take 1 tablet by mouth daily.   Yes [provider]  betamethasone  dipropionate 0.05 % cream Apply 1 application  topically 2 (two) times daily as needed (for psoriasis and keratosis pilaris - under the breasts or on the stomach).   Yes [provider]  Calcium  Carbonate-Vitamin D  (CALTRATE 600+D PO) Take 1 tablet by mouth in the morning.   Yes [provider]  cetirizine (ZYRTEC) 10 MG tablet Take 10 mg by mouth in the morning.   Yes [provider]  cholecalciferol (VITAMIN D3) 25 MCG (1000 UNIT) tablet Take 1,000 Units by mouth daily.   Yes [provider]  esomeprazole (NEXIUM) 40 MG capsule Take 40 mg by mouth 2 (two) times daily before a meal.   Yes [provider]  ferrous sulfate  325 (65 FE) MG tablet Take 325 mg by mouth 2 (two) times a week.   Yes [provider]  Fluticasone -Salmeterol (ADVAIR ) 250-50 MCG/DOSE AEPB Inhale 1 puff into the lungs in the morning and at bedtime.   Yes [provider]  loperamide  (IMODIUM ) 2 MG capsule Take 1 capsule (2 mg total) by mouth 2 (two) times daily as needed for diarrhea or loose stools. 04/06/23  Yes Christopher Savannah, [name]  Multiple Vitamin (MULTIVITAMIN) tablet Take 1 tablet by mouth See admin instructions. Solgar Formula VM-75 Multivitamin with Chelated Minerals  tablets - Take 1 tablet by mouth with breakfast   Yes [provider]  nebivolol  (BYSTOLIC ) 5 MG tablet Take 1 tablet by mouth once daily 11/28/22  Yes Fernande Elspeth BROCKS, MD  potassium chloride  SA (KLOR-CON ) 20 MEQ tablet Take 20 mEq by mouth 3 (three) times daily.   Yes [provider]  rivaroxaban  (XARELTO ) 20 MG TABS tablet TAKE 1 TABLET BY MOUTH ONCE DAILY WITH SUPPER 12/17/23  Yes Almetta Donnice LABOR, MD  rosuvastatin  (CRESTOR ) 5 MG tablet Take 5 mg by mouth at bedtime.   Yes [provider]  simethicone  (MYLICON) 125 MG chewable tablet Chew 125 mg by mouth every 6 (six) hours as needed for flatulence.   Yes [provider]  sodium chloride  (OCEAN) 0.65 % SOLN nasal spray Place 1 spray into both nostrils as needed for congestion.   Yes [provider]  SSD 1 % cream Apply 1 Application topically 2 (two) times daily as needed (for chafing OR for psoriasis and keratosis pilaris - under the breasts or on the stomach). 07/25/22  Yes [provider]  SYSTANE HYDRATION PF 0.4-0.3 % SOLN Place 1 drop into both eyes 3 (three) times daily as needed (for dryness or inflammation).   Yes [provider]  telmisartan (MICARDIS) 40 MG tablet Take 40 mg by mouth in the morning.   Yes [provider]  torsemide  (DEMADEX ) 20 MG tablet Take 1 tablet (20 mg total) by mouth daily. 12/11/23  Yes Almetta Donnice LABOR, MD  traMADol  (ULTRAM ) 50 MG tablet Take 50 mg by mouth 2 (two) times daily as needed for severe pain (pain score 7-10).   Yes [provider]  triamcinolone  cream (KENALOG) 0.1 % Apply 1 application  topically 2 (two) times daily as needed (for psoriasis and keratosis pilaris - under the breasts or on the stomach). 07/05/20  Yes [provider]  oxyCODONE  (OXY IR/ROXICODONE ) 5 MG immediate release tablet Take 0.5-1 tablets (2.5-5 mg total) by mouth every 6 (six) hours as needed for severe pain (pain score 7-10). Patient not  taking: Reported on 12/31/2023 12/31/23   Raford Lenis, MD  predniSONE  (DELTASONE ) 50 MG tablet Take 1 tablet (50 mg total) by mouth daily. Patient not taking: Reported on 01/30/2024 12/31/23   Raford Lenis, MD    Allergies[1]  Social History   Socioeconomic History   Marital status: Married    Spouse name: Not on file   Number of children: 1   Years of education: Not on file   Highest education level: Not on file  Occupational History   Occupation: Retired engineer, site  Tobacco Use   Smoking status: Former    Current packs/day: 0.00    Types: Cigarettes    Quit date: 01/23/1978    Years since quitting: 46.0   Smokeless tobacco: Never  Vaping Use   Vaping status: Never Used  Substance and Sexual Activity   Alcohol  use: No   Drug use: No   Sexual activity: Yes    Birth control/protection: Surgical  Other Topics Concern   Not on file  Social History Narrative   Not on file   Social Drivers of Health   Tobacco Use: Medium Risk (01/21/2024)   Received from [facility]   Patient History    Smoking Tobacco Use: Former    Smokeless Tobacco Use: Former    Passive Exposure: Past  Physicist, Medical Strain: Not on [employer] Insecurity: Low Risk (01/21/2024)   Received from [facility]   Epic    Within the past 12 months, you worried that your food would run out before you got money to buy more: Never true    Within the past 12 months, the food you bought just didn't last and you didn't have money to get more. : Never true  Transportation Needs: No Transportation Needs (01/21/2024)   Received from [facility]    In the past 12 months, has lack of reliable transportation kept you from medical appointments, meetings, work or from getting things needed for daily living? : No  Physical Activity: Not on file  Stress: Not on file  Social Connections: Not on file  Intimate Partner Violence: Not At Risk (12/07/2023)   Epic    Fear of Current or Ex-Partner:  No    Emotionally Abused: No    Physically Abused: No  Sexually Abused: No  Depression (PHQ2-9): Low Risk (12/07/2023)   Depression (PHQ2-9)    PHQ-2 Score: 0  Alcohol  Screen: Not on file  Housing: Medium Risk (01/21/2024)   Received from [facility]   Epic    What is your living situation today?: I have a place to live today, but I am worried about losing it in the future    Think about the place you live. Do you have problems with any of the following? Choose all that apply:: None/None on this list  Utilities: Low Risk (01/21/2024)   Received from [facility]   Utilities    In the past 12 months has the electric, gas, oil, or water  company threatened to shut off services in your home? : No  Health Literacy: Not on file    Tobacco Use: Medium Risk (01/21/2024)   Received from [facility]   Patient History    Smoking Tobacco Use: Former    Smokeless Tobacco Use: Former    Passive Exposure: Past   Social History   Substance and Sexual Activity  Alcohol  Use No    Family History  Problem Relation Age of Onset   Cancer Mother    Heart failure Other    Colon cancer Neg Hx    Esophageal cancer Neg Hx    Rectal cancer Neg Hx    Stomach cancer Neg Hx    Colon polyps Neg Hx     ROS  Objective:  Physical Exam: - Well-developed female alert and oriented in no apparent distress   - Left leg looks substantially better than at the last visit.   - No peripheral edema in the left leg.   - No knee effusion.    - Range of motion: 0-120 degrees with crepitus on range of motion and diffuse tenderness.  - No instability.  IMAGING:  - X-rays of the left knee demonstrate tricompartmental bone-on-bone changes.  Assessment/Plan:  End stage arthritis, left knee   The patient history, physical examination, clinical judgment of the provider and imaging studies are consistent with end stage degenerative joint disease of the left knee and total knee arthroplasty is deemed  medically necessary. The treatment options including medical management, injection therapy arthroscopy and arthroplasty were discussed at length. The risks and benefits of total knee arthroplasty were presented and reviewed. The risks due to aseptic loosening, infection, stiffness, patella tracking problems, thromboembolic complications and other imponderables were discussed. The patient acknowledged the explanation, agreed to proceed with the plan and consent was signed. Patient is being admitted for inpatient treatment for surgery, pain control, PT, OT, prophylactic antibiotics, VTE prophylaxis, progressive ambulation and ADLs and discharge planning. The patient is planning to be discharged to skilled nursing facility.   Patient's anticipated LOS is less than 2 midnights, meeting these requirements: - Lives within 1 hour of care - Has a competent adult at home to recover with post-op recover - NO history of  - Chronic pain requiring opiods  - Diabetes  - Coronary Artery Disease  - Heart failure  - Heart attack  - Stroke  - DVT/VTE  - Respiratory Failure/COPD  - Renal failure  - Advanced Liver disease   Therapy Plans: SNF, then EO Disposition: SNF, then home with [facility] aide and husband Planned DVT Prophylaxis: Xarelto  10mg   DME Needed: None PCP: Dr. Janey (clearance received) Cardiologist: Lonni Cash, MD (clearance received) TXA: IV Allergies: ephedrine  (dyspnea) Metal Allergy: None Anesthesia Concerns: Recurrent epistaxis of right nare BMI: 34.5 Last HgbA1c:  Not diabetic Pain Regimen: Oxycodone , tramadol  Pharmacy: Printed for SNF  Other: -Hold Xarelto  3 days prior to surgery -Patient requests 1 week of SNF at discharge. Husband having orthopedic difficulties (recent hip sx and pinched nerve in back). Pt was recently at [facility] x 2 weeks because of knee pain and inability to get herself up at home. Does not have other family to assist.   - Patient was instructed  on what medications to stop prior to surgery. - Follow-up visit in 2 weeks with Dr. Melodi - Begin physical therapy following surgery - Pre-operative lab work as pre-surgical testing - Prescriptions will be provided in hospital at time of discharge  Corean Sender, [name] Orthopedic Surgery [facility] [place]       [1]  Allergies Allergen Reactions   Ephedrine  Shortness Of Breath   Cortisone Other (See Comments)    Severe muscle spasms with certain types of cortisone   Fluticasone  Propionate     Caused nose bleeds

## 2024-02-01 NOTE — Anesthesia Preprocedure Evaluation (Addendum)
 "                                  Anesthesia Evaluation  Patient identified by MRN, date of birth, ID band Patient awake    Reviewed: Allergy & Precautions, NPO status , Patient's Chart, lab work & pertinent test results  History of Anesthesia Complications Negative for: history of anesthetic complications  Airway Mallampati: III  TM Distance: >3 FB Neck ROM: Full    Dental no notable dental hx. (+) Teeth Intact   Pulmonary asthma , neg sleep apnea, neg COPD, Patient abstained from smoking.Not current smoker, former smoker   Pulmonary exam normal breath sounds clear to auscultation       Cardiovascular Exercise Tolerance: Good METShypertension, (-) CAD and (-) Past MI + dysrhythmias  Rhythm:Irregular Rate:Normal - Systolic murmurs 79 year old female follows with cardiology for history of SVT, paroxysmal atrial fibrillation/flutter on Xarelto , HTN, HFpEF.  Echo 10/2023 showed LVEF 60 to 65%, normal RV systolic function, no significant valvular abnormality.  Clearance per telephone encounter by Lum Louis, NP on 11/20/2023, Chart reviewed as part of pre-operative protocol coverage. Given past medical history and time since last visit, based on ACC/AHA guidelines, Earlie Arciga is at acceptable risk for the planned procedure without further cardiovascular testing. Per Dr. Almetta: RCRI 0-1 with HFpEF. Low-intermediate risk to proceed with knee arthroplasty. C2V (5) however no atrial arrhythmias in several years so interruption of OAC is relatively low risk pre and post procedure. Per office protocol, patient can hold Xarelto  for 3 days prior to procedure.  Patient will not need bridging with Lovenox  (enoxaparin ) around procedure.   Patient reports she was told to hold Xarelto  72 hours prior to surgery.   Other pertinent history of remote former smoker (quit 1988), asthma, GERD on PPI, breast cancer s/p lumpectomy and radiation 1999, right breast major duct  excision 07/2022 with negative pathology.   Preop labs reviewed, mild anemia Hgb 10.6, otherwise unremarkable.   EKG 09/27/2023: NSR with PACs.  Rate 75.   TTE 10/30/2023: 1. Left ventricular ejection fraction, by estimation, is 60 to 65%. The  left ventricle has normal function. The left ventricle has no regional  wall motion abnormalities. There is mild left ventricular hypertrophy.  Left ventricular diastolic parameters  were normal.   2. Right ventricular systolic function is normal. The right ventricular  size is mildly enlarged. There is normal pulmonary artery systolic  pressure.   3. Left atrial size was mildly dilated.   4. The mitral valve is normal in structure. No evidence of mitral valve  regurgitation. No evidence of mitral stenosis.   5. The aortic valve is tricuspid. Aortic valve regurgitation is not  visualized.   6. The inferior vena cava is dilated in size with >50% respiratory  variability, suggesting right atrial pressure of 8 mmHg.     Neuro/Psych  PSYCHIATRIC DISORDERS Anxiety       Hx Chiari Malformation per patient. Unclear history, perhaps discovered incidentally. It has not affected her in any way.    GI/Hepatic ,GERD  Medicated and Controlled,,(+)     (-) substance abuse    Endo/Other  neg diabetes    Renal/GU negative Renal ROS     Musculoskeletal  (+) Arthritis ,    Abdominal  (+) + obese  Peds  Hematology Last eliquis  dose 4 days ago   Anesthesia Other Findings Past Medical History: 2006: Abnormal Pap  smear     Comment:  vaginal medicine according to patient No date: Allergy No date: Arthritis No date: Asthma 06/2005: Atrophy of vagina 1999: Breast cancer (HCC)     Comment:  Left 08/06/2014: CAP (community acquired pneumonia) No date: Cataract     Comment:  cataracts lt. eye    cataract removed. No date: Chiari malformation No date: Complication of anesthesia     Comment:  woke up during left breast surgery age 22 No date:  Diverticulosis No date: Dysrhythmia     Comment:  SVT No date: GERD (gastroesophageal reflux disease) No date: H/O osteopenia     Comment:  08/2006 No date: H/O varicella No date: Heart murmur No date: Hypertension 07/2007: Monilial vaginitis No date: Multiple allergies No date: Ovarian cyst 03/13/2012: Personal history of colonic adenomas 1990: Personal history of radiation therapy 07/2014: Pneumonia 03/2009: VIN III (vulvar intraepithelial neoplasia III) No date: Yeast infection  Reproductive/Obstetrics                              Anesthesia Physical Anesthesia Plan  ASA: 3  Anesthesia Plan: Spinal   Post-op Pain Management: Ofirmev  IV (intra-op)* and Regional block*   Induction: Intravenous  PONV Risk Score and Plan: 2 and Ondansetron , Dexamethasone , Propofol  infusion, TIVA and Treatment may vary due to age or medical condition  Airway Management Planned: Natural Airway  Additional Equipment: None  Intra-op Plan:   Post-operative Plan:   Informed Consent: I have reviewed the patients History and Physical, chart, labs and discussed the procedure including the risks, benefits and alternatives for the proposed anesthesia with the patient or authorized representative who has indicated his/her understanding and acceptance.       Plan Discussed with: CRNA and Surgeon  Anesthesia Plan Comments: (Discussed R/B/A of neuraxial anesthesia technique with patient: - rare risks of spinal/epidural hematoma, nerve damage, infection - Risk of PDPH - Risk of nausea and vomiting - Risk of conversion to general anesthesia and its associated risks, including sore throat, damage to lips/eyes/teeth/oropharynx, and rare risks such as cardiac and respiratory events. - Risk of allergic reactions  Discussed r/b/a of adductor canal nerve block, including:  - bleeding, infection, nerve damage - poor or non functioning block. - reactions and toxicity to local  anesthetic Patient understands.  Discussed the role of CRNA in patient's perioperative care.  Patient voiced understanding.)         Anesthesia Quick Evaluation  "

## 2024-02-01 NOTE — Progress Notes (Signed)
 Anesthesia Chart Review:  79 year old female follows with cardiology for history of SVT, paroxysmal atrial fibrillation/flutter on Xarelto , HTN, HFpEF.  Echo 10/2023 showed LVEF 60 to 65%, normal RV systolic function, no significant valvular abnormality.  Clearance per telephone encounter by Lum Louis, NP on 11/20/2023, Chart reviewed as part of pre-operative protocol coverage. Given past medical history and time since last visit, based on ACC/AHA guidelines, Bridget Cortez is at acceptable risk for the planned procedure without further cardiovascular testing. Per Dr. Almetta: RCRI 0-1 with HFpEF. Low-intermediate risk to proceed with knee arthroplasty. C2V (5) however no atrial arrhythmias in several years so interruption of OAC is relatively low risk pre and post procedure. Per office protocol, patient can hold Xarelto  for 3 days prior to procedure.  Patient will not need bridging with Lovenox  (enoxaparin ) around procedure.  Patient reports she was told to hold Xarelto  72 hours prior to surgery.  Other pertinent history of remote former smoker (quit 1988), asthma, GERD on PPI, breast cancer s/p lumpectomy and radiation 1999, right breast major duct excision 07/2022 with negative pathology.  Preop labs reviewed, mild anemia Hgb 10.6, otherwise unremarkable.  EKG 09/27/2023: NSR with PACs.  Rate 75.  TTE 10/30/2023: 1. Left ventricular ejection fraction, by estimation, is 60 to 65%. The  left ventricle has normal function. The left ventricle has no regional  wall motion abnormalities. There is mild left ventricular hypertrophy.  Left ventricular diastolic parameters  were normal.   2. Right ventricular systolic function is normal. The right ventricular  size is mildly enlarged. There is normal pulmonary artery systolic  pressure.   3. Left atrial size was mildly dilated.   4. The mitral valve is normal in structure. No evidence of mitral valve  regurgitation. No evidence of  mitral stenosis.   5. The aortic valve is tricuspid. Aortic valve regurgitation is not  visualized.   6. The inferior vena cava is dilated in size with >50% respiratory  variability, suggesting right atrial pressure of 8 mmHg.     Lynwood Geofm RIGGERS [facility] Short Stay Center/Anesthesiology Phone [phone] 02/01/2024 1:49 PM

## 2024-02-11 ENCOUNTER — Other Ambulatory Visit: Payer: Self-pay

## 2024-02-11 ENCOUNTER — Observation Stay (HOSPITAL_COMMUNITY)
Admission: RE | Admit: 2024-02-11 | Discharge: 2024-02-14 | Disposition: A | Discharge status: Skilled Nursing Facility | Source: Ambulatory Visit | Attending: Orthopedic Surgery | Admitting: Orthopedic Surgery

## 2024-02-11 ENCOUNTER — Encounter (HOSPITAL_COMMUNITY): Payer: Self-pay | Admitting: Orthopedic Surgery

## 2024-02-11 ENCOUNTER — Encounter (HOSPITAL_COMMUNITY)
Admission: RE | Disposition: A | Discharge status: Skilled Nursing Facility | Payer: Self-pay | Source: Ambulatory Visit | Attending: Orthopedic Surgery

## 2024-02-11 ENCOUNTER — Ambulatory Visit (HOSPITAL_COMMUNITY): Payer: Self-pay | Admitting: Physician Assistant

## 2024-02-11 ENCOUNTER — Ambulatory Visit (HOSPITAL_COMMUNITY): Admitting: Anesthesiology

## 2024-02-11 DIAGNOSIS — I10 Essential (primary) hypertension: Secondary | ICD-10-CM | POA: Diagnosis not present

## 2024-02-11 DIAGNOSIS — Z87891 Personal history of nicotine dependence: Secondary | ICD-10-CM

## 2024-02-11 DIAGNOSIS — Z79899 Other long term (current) drug therapy: Secondary | ICD-10-CM | POA: Insufficient documentation

## 2024-02-11 DIAGNOSIS — J45909 Unspecified asthma, uncomplicated: Secondary | ICD-10-CM

## 2024-02-11 DIAGNOSIS — M1712 Unilateral primary osteoarthritis, left knee: Secondary | ICD-10-CM | POA: Diagnosis present

## 2024-02-11 DIAGNOSIS — I110 Hypertensive heart disease with heart failure: Secondary | ICD-10-CM | POA: Diagnosis not present

## 2024-02-11 DIAGNOSIS — Z96651 Presence of right artificial knee joint: Secondary | ICD-10-CM | POA: Diagnosis not present

## 2024-02-11 DIAGNOSIS — M179 Osteoarthritis of knee, unspecified: Principal | ICD-10-CM | POA: Diagnosis present

## 2024-02-11 DIAGNOSIS — Z853 Personal history of malignant neoplasm of breast: Secondary | ICD-10-CM | POA: Insufficient documentation

## 2024-02-11 DIAGNOSIS — I5030 Unspecified diastolic (congestive) heart failure: Secondary | ICD-10-CM | POA: Insufficient documentation

## 2024-02-11 HISTORY — PX: TOTAL KNEE ARTHROPLASTY: SHX125

## 2024-02-11 MED ORDER — OXYCODONE HCL 5 MG PO TABS
5.0000 mg | ORAL_TABLET | ORAL | Status: DC | PRN
  Administered 2024-02-12 (×2): 5 mg via ORAL
  Administered 2024-02-12 – 2024-02-14 (×5): 10 mg via ORAL
  Filled 2024-02-11 (×4): qty 2
  Filled 2024-02-11 (×2): qty 1
  Filled 2024-02-11: qty 2

## 2024-02-11 MED ORDER — NEBIVOLOL HCL 5 MG PO TABS
5.0000 mg | ORAL_TABLET | Freq: Every day | ORAL | Status: DC
  Administered 2024-02-12 – 2024-02-14 (×3): 5 mg via ORAL
  Filled 2024-02-11 (×3): qty 1

## 2024-02-11 MED ORDER — FENTANYL CITRATE (PF) 100 MCG/2ML IJ SOLN
INTRAMUSCULAR | Status: DC | PRN
  Administered 2024-02-11 (×2): 50 ug via INTRAVENOUS

## 2024-02-11 MED ORDER — METHOCARBAMOL 500 MG PO TABS
500.0000 mg | ORAL_TABLET | Freq: Four times a day (QID) | ORAL | Status: DC | PRN
  Administered 2024-02-12 – 2024-02-13 (×2): 500 mg via ORAL
  Filled 2024-02-11 (×2): qty 1

## 2024-02-11 MED ORDER — POLYETHYLENE GLYCOL 3350 17 G PO PACK: 17.0000 g | PACK | Freq: Every day | ORAL | Status: DC | PRN

## 2024-02-11 MED ORDER — FENTANYL CITRATE (PF) 50 MCG/ML IJ SOSY
50.0000 ug | PREFILLED_SYRINGE | Freq: Once | INTRAMUSCULAR | Status: AC
  Administered 2024-02-11: 50 ug via INTRAVENOUS
  Filled 2024-02-11: qty 2

## 2024-02-11 MED ORDER — FENTANYL CITRATE (PF) 100 MCG/2ML IJ SOLN
INTRAMUSCULAR | Status: AC
  Filled 2024-02-11: qty 2

## 2024-02-11 MED ORDER — FLEET ENEMA RE ENEM: 1.0000 | ENEMA | Freq: Once | RECTAL | Status: DC | PRN

## 2024-02-11 MED ORDER — OXYCODONE HCL 5 MG PO TABS: 5.0000 mg | ORAL_TABLET | Freq: Once | ORAL | Status: DC | PRN

## 2024-02-11 MED ORDER — PROPOFOL 10 MG/ML IV BOLUS
INTRAVENOUS | Status: AC
  Filled 2024-02-11: qty 20

## 2024-02-11 MED ORDER — CEFAZOLIN SODIUM-DEXTROSE 2-4 GM/100ML-% IV SOLN
2.0000 g | Freq: Four times a day (QID) | INTRAVENOUS | Status: AC
  Administered 2024-02-11 – 2024-02-12 (×2): 2 g via INTRAVENOUS
  Filled 2024-02-11 (×2): qty 100

## 2024-02-11 MED ORDER — STERILE WATER FOR IRRIGATION IR SOLN
Status: DC | PRN
  Administered 2024-02-11: 2000 mL

## 2024-02-11 MED ORDER — DEXAMETHASONE SOD PHOSPHATE PF 10 MG/ML IJ SOLN
10.0000 mg | Freq: Once | INTRAMUSCULAR | Status: AC
  Administered 2024-02-12: 10 mg via INTRAVENOUS
  Filled 2024-02-11: qty 1

## 2024-02-11 MED ORDER — RIVAROXABAN 10 MG PO TABS
10.0000 mg | ORAL_TABLET | Freq: Every day | ORAL | Status: DC
  Administered 2024-02-12 – 2024-02-14 (×3): 10 mg via ORAL
  Filled 2024-02-11 (×3): qty 1

## 2024-02-11 MED ORDER — IRBESARTAN 150 MG PO TABS
150.0000 mg | ORAL_TABLET | Freq: Every day | ORAL | Status: DC
  Administered 2024-02-12 – 2024-02-14 (×3): 150 mg via ORAL
  Filled 2024-02-11 (×3): qty 1

## 2024-02-11 MED ORDER — BISACODYL 10 MG RE SUPP: 10.0000 mg | Freq: Every day | RECTAL | Status: DC | PRN

## 2024-02-11 MED ORDER — CHLORHEXIDINE GLUCONATE 0.12 % MT SOLN
15.0000 mL | Freq: Once | OROMUCOSAL | Status: AC
  Administered 2024-02-11: 15 mL via OROMUCOSAL

## 2024-02-11 MED ORDER — HYDROMORPHONE HCL 1 MG/ML IJ SOLN
INTRAMUSCULAR | Status: AC
  Filled 2024-02-11: qty 1

## 2024-02-11 MED ORDER — SODIUM CHLORIDE 0.9 % IV SOLN: INTRAVENOUS | Status: DC

## 2024-02-11 MED ORDER — SODIUM CHLORIDE 0.9 % IR SOLN
Status: DC | PRN
  Administered 2024-02-11: 1000 mL

## 2024-02-11 MED ORDER — ORAL CARE MOUTH RINSE: 15.0000 mL | OROMUCOSAL | Status: DC | PRN

## 2024-02-11 MED ORDER — ACETAMINOPHEN 325 MG PO TABS
325.0000 mg | ORAL_TABLET | Freq: Four times a day (QID) | ORAL | Status: DC | PRN
  Administered 2024-02-13: 650 mg via ORAL
  Filled 2024-02-11: qty 2

## 2024-02-11 MED ORDER — LACTATED RINGERS IV SOLN: INTRAVENOUS | Status: DC

## 2024-02-11 MED ORDER — FERROUS SULFATE 325 (65 FE) MG PO TABS
325.0000 mg | ORAL_TABLET | ORAL | Status: DC
  Administered 2024-02-11 – 2024-02-14 (×2): 325 mg via ORAL
  Filled 2024-02-11 (×3): qty 1

## 2024-02-11 MED ORDER — POTASSIUM CHLORIDE CRYS ER 20 MEQ PO TBCR
20.0000 meq | EXTENDED_RELEASE_TABLET | Freq: Three times a day (TID) | ORAL | Status: DC
  Administered 2024-02-12 – 2024-02-14 (×7): 20 meq via ORAL
  Filled 2024-02-11 (×8): qty 1

## 2024-02-11 MED ORDER — PHENOL 1.4 % MT LIQD: 1.0000 | OROMUCOSAL | Status: DC | PRN

## 2024-02-11 MED ORDER — AMLODIPINE BESYLATE 5 MG PO TABS
5.0000 mg | ORAL_TABLET | Freq: Every day | ORAL | Status: DC
  Administered 2024-02-12 – 2024-02-13 (×2): 5 mg via ORAL
  Filled 2024-02-11 (×2): qty 1

## 2024-02-11 MED ORDER — BUPIVACAINE-EPINEPHRINE (PF) 0.25% -1:200000 IJ SOLN
INTRAMUSCULAR | Status: DC | PRN
  Administered 2024-02-11: 20 mL via PERINEURAL

## 2024-02-11 MED ORDER — FLUTICASONE FUROATE-VILANTEROL 200-25 MCG/ACT IN AEPB
1.0000 | INHALATION_SPRAY | Freq: Every day | RESPIRATORY_TRACT | Status: DC
  Filled 2024-02-11: qty 28

## 2024-02-11 MED ORDER — SODIUM CHLORIDE (PF) 0.9 % IJ SOLN
INTRAMUSCULAR | Status: DC | PRN
  Administered 2024-02-11: 80 mL

## 2024-02-11 MED ORDER — SUCCINYLCHOLINE CHLORIDE 200 MG/10ML IV SOSY
PREFILLED_SYRINGE | INTRAVENOUS | Status: DC | PRN
  Administered 2024-02-11: 120 mg via INTRAVENOUS

## 2024-02-11 MED ORDER — PROPOFOL 10 MG/ML IV BOLUS
INTRAVENOUS | Status: DC | PRN
  Administered 2024-02-11: 150 mg via INTRAVENOUS
  Administered 2024-02-11: 150 ug/kg/min via INTRAVENOUS
  Administered 2024-02-11: 50 mg via INTRAVENOUS

## 2024-02-11 MED ORDER — PANTOPRAZOLE SODIUM 40 MG PO TBEC
40.0000 mg | DELAYED_RELEASE_TABLET | Freq: Every day | ORAL | Status: DC
  Administered 2024-02-12 – 2024-02-14 (×3): 40 mg via ORAL
  Filled 2024-02-11 (×3): qty 1

## 2024-02-11 MED ORDER — FENTANYL CITRATE (PF) 50 MCG/ML IJ SOSY
25.0000 ug | PREFILLED_SYRINGE | INTRAMUSCULAR | Status: DC | PRN
  Administered 2024-02-11: 25 ug via INTRAVENOUS
  Administered 2024-02-11 (×2): 50 ug via INTRAVENOUS
  Administered 2024-02-11: 25 ug via INTRAVENOUS

## 2024-02-11 MED ORDER — ORAL CARE MOUTH RINSE: 15.0000 mL | Freq: Once | OROMUCOSAL | Status: AC

## 2024-02-11 MED ORDER — DOCUSATE SODIUM 100 MG PO CAPS
100.0000 mg | ORAL_CAPSULE | Freq: Two times a day (BID) | ORAL | Status: DC
  Administered 2024-02-11 – 2024-02-14 (×6): 100 mg via ORAL
  Filled 2024-02-11 (×7): qty 1

## 2024-02-11 MED ORDER — TORSEMIDE 20 MG PO TABS
20.0000 mg | ORAL_TABLET | Freq: Every day | ORAL | Status: DC
  Administered 2024-02-12 – 2024-02-14 (×3): 20 mg via ORAL
  Filled 2024-02-11 (×3): qty 1

## 2024-02-11 MED ORDER — TRAMADOL HCL 50 MG PO TABS
50.0000 mg | ORAL_TABLET | Freq: Four times a day (QID) | ORAL | Status: DC | PRN
  Administered 2024-02-11: 50 mg via ORAL
  Administered 2024-02-12 (×2): 100 mg via ORAL
  Filled 2024-02-11 (×2): qty 2
  Filled 2024-02-11: qty 1

## 2024-02-11 MED ORDER — ROSUVASTATIN CALCIUM 5 MG PO TABS
5.0000 mg | ORAL_TABLET | Freq: Every day | ORAL | Status: DC
  Administered 2024-02-12 – 2024-02-13 (×2): 5 mg via ORAL
  Filled 2024-02-11 (×2): qty 1

## 2024-02-11 MED ORDER — SODIUM CHLORIDE (PF) 0.9 % IJ SOLN
INTRAMUSCULAR | Status: AC
  Filled 2024-02-11: qty 10

## 2024-02-11 MED ORDER — MIDAZOLAM HCL 5 MG/5ML IJ SOLN
INTRAMUSCULAR | Status: DC | PRN
  Administered 2024-02-11: 1 mg via INTRAVENOUS

## 2024-02-11 MED ORDER — ACETAMINOPHEN 10 MG/ML IV SOLN: 1000.0000 mg | Freq: Four times a day (QID) | INTRAVENOUS | Status: DC

## 2024-02-11 MED ORDER — BUPIVACAINE LIPOSOME 1.3 % IJ SUSP: 20.0000 mL | Freq: Once | INTRAMUSCULAR | Status: AC

## 2024-02-11 MED ORDER — METHOCARBAMOL 1000 MG/10ML IJ SOLN: 500.0000 mg | Freq: Four times a day (QID) | INTRAMUSCULAR | Status: DC | PRN

## 2024-02-11 MED ORDER — SODIUM CHLORIDE (PF) 0.9 % IJ SOLN
INTRAMUSCULAR | Status: AC
  Filled 2024-02-11: qty 50

## 2024-02-11 MED ORDER — METOCLOPRAMIDE HCL 5 MG/ML IJ SOLN: 5.0000 mg | Freq: Three times a day (TID) | INTRAMUSCULAR | Status: DC | PRN

## 2024-02-11 MED ORDER — METOCLOPRAMIDE HCL 5 MG PO TABS: 5.0000 mg | ORAL_TABLET | Freq: Three times a day (TID) | ORAL | Status: DC | PRN

## 2024-02-11 MED ORDER — DIPHENHYDRAMINE HCL 12.5 MG/5ML PO ELIX: 12.5000 mg | ORAL_SOLUTION | ORAL | Status: DC | PRN

## 2024-02-11 MED ORDER — ACETAMINOPHEN 500 MG PO TABS: 1000.0000 mg | ORAL_TABLET | Freq: Once | ORAL | Status: AC

## 2024-02-11 MED ORDER — ACETAMINOPHEN 10 MG/ML IV SOLN
INTRAVENOUS | Status: AC
  Filled 2024-02-11: qty 100

## 2024-02-11 MED ORDER — CEFAZOLIN SODIUM-DEXTROSE 2-4 GM/100ML-% IV SOLN
2.0000 g | INTRAVENOUS | Status: AC
  Administered 2024-02-11: 2 g via INTRAVENOUS

## 2024-02-11 MED ORDER — MENTHOL 3 MG MT LOZG: 1.0000 | LOZENGE | OROMUCOSAL | Status: DC | PRN

## 2024-02-11 MED ORDER — 0.9 % SODIUM CHLORIDE (POUR BTL) OPTIME
TOPICAL | Status: DC | PRN
  Administered 2024-02-11: 1000 mL

## 2024-02-11 MED ORDER — TRANEXAMIC ACID-NACL 1000-0.7 MG/100ML-% IV SOLN
1000.0000 mg | INTRAVENOUS | Status: AC
  Administered 2024-02-11: 1000 mg via INTRAVENOUS

## 2024-02-11 MED ORDER — PHENYLEPHRINE 80 MCG/ML (10ML) SYRINGE FOR IV PUSH (FOR BLOOD PRESSURE SUPPORT)
PREFILLED_SYRINGE | INTRAVENOUS | Status: DC | PRN
  Administered 2024-02-11: 160 ug via INTRAVENOUS

## 2024-02-11 MED ORDER — ALBUTEROL SULFATE (2.5 MG/3ML) 0.083% IN NEBU: 2.5000 mg | INHALATION_SOLUTION | Freq: Four times a day (QID) | RESPIRATORY_TRACT | Status: DC | PRN

## 2024-02-11 MED ORDER — ACETAMINOPHEN 500 MG PO TABS
1000.0000 mg | ORAL_TABLET | Freq: Four times a day (QID) | ORAL | Status: AC
  Administered 2024-02-11 – 2024-02-12 (×4): 1000 mg via ORAL
  Filled 2024-02-11 (×4): qty 2

## 2024-02-11 MED ORDER — ACETAMINOPHEN 10 MG/ML IV SOLN
1000.0000 mg | Freq: Once | INTRAVENOUS | Status: DC | PRN
  Administered 2024-02-11: 1000 mg via INTRAVENOUS

## 2024-02-11 MED ORDER — ONDANSETRON HCL 4 MG/2ML IJ SOLN
4.0000 mg | Freq: Four times a day (QID) | INTRAMUSCULAR | Status: DC | PRN
  Administered 2024-02-11: 4 mg via INTRAVENOUS
  Filled 2024-02-11: qty 2

## 2024-02-11 MED ORDER — DEXAMETHASONE SOD PHOSPHATE PF 10 MG/ML IJ SOLN: 8.0000 mg | Freq: Once | INTRAMUSCULAR | Status: DC

## 2024-02-11 MED ORDER — BUPIVACAINE LIPOSOME 1.3 % IJ SUSP
INTRAMUSCULAR | Status: AC
  Filled 2024-02-11: qty 20

## 2024-02-11 MED ORDER — HYDROMORPHONE HCL 1 MG/ML IJ SOLN
0.5000 mg | INTRAMUSCULAR | Status: DC | PRN
  Administered 2024-02-14: 1 mg via INTRAVENOUS
  Filled 2024-02-11: qty 1

## 2024-02-11 MED ORDER — ONDANSETRON HCL 4 MG PO TABS: 4.0000 mg | ORAL_TABLET | Freq: Four times a day (QID) | ORAL | Status: DC | PRN

## 2024-02-11 MED ORDER — PROPOFOL 1000 MG/100ML IV EMUL
INTRAVENOUS | Status: AC
  Filled 2024-02-11: qty 100

## 2024-02-11 MED ORDER — DROPERIDOL 2.5 MG/ML IJ SOLN: 0.6250 mg | Freq: Once | INTRAMUSCULAR | Status: DC | PRN

## 2024-02-11 MED ORDER — FENTANYL CITRATE (PF) 50 MCG/ML IJ SOSY
PREFILLED_SYRINGE | INTRAMUSCULAR | Status: AC
  Filled 2024-02-11: qty 2

## 2024-02-11 MED ORDER — OXYCODONE HCL 5 MG/5ML PO SOLN: 5.0000 mg | Freq: Once | ORAL | Status: DC | PRN

## 2024-02-11 MED ORDER — MIDAZOLAM HCL 2 MG/2ML IJ SOLN
INTRAMUSCULAR | Status: AC
  Filled 2024-02-11: qty 2

## 2024-02-11 MED ORDER — HYDROMORPHONE HCL 1 MG/ML IJ SOLN
0.2500 mg | INTRAMUSCULAR | Status: DC | PRN
  Administered 2024-02-11 (×4): 0.5 mg via INTRAVENOUS

## 2024-02-11 MED ORDER — FENTANYL CITRATE (PF) 50 MCG/ML IJ SOSY
PREFILLED_SYRINGE | INTRAMUSCULAR | Status: AC
  Filled 2024-02-11: qty 1

## 2024-02-11 MED ORDER — POVIDONE-IODINE 10 % EX SWAB: 2.0000 | Freq: Once | CUTANEOUS | Status: DC

## 2024-02-11 NOTE — Anesthesia Postprocedure Evaluation (Signed)
"   Anesthesia Post Note  Patient: Bridget Cortez  Procedure(s) Performed: ARTHROPLASTY, KNEE, TOTAL (Left: Knee)     Patient location during evaluation: PACU Anesthesia Type: General Level of consciousness: awake and alert Pain management: pain level controlled Vital Signs Assessment: post-procedure vital signs reviewed and stable Respiratory status: spontaneous breathing, nonlabored ventilation, respiratory function stable and patient connected to nasal cannula oxygen  Cardiovascular status: blood pressure returned to baseline and stable Postop Assessment: no apparent nausea or vomiting Anesthetic complications: no   No notable events documented.  Last Vitals:  Vitals:   02/11/24 1325 02/11/24 1330  BP: (!) 146/78 (!) 159/92  Pulse: 92 87  Resp: 18 15  Temp:    SpO2: 99% 100%    Last Pain:  Vitals:   02/11/24 1330  TempSrc:   PainSc: 0-No pain                 Rome Ade      "

## 2024-02-11 NOTE — Op Note (Signed)
 "    OPERATIVE REPORT-TOTAL KNEE ARTHROPLASTY   Pre-operative diagnosis- Osteoarthritis  Left knee(s)  Post-operative diagnosis- Osteoarthritis Left knee(s)  Procedure-  Left  Total Knee Arthroplasty  Surgeon- Bridget GAILS. Nika Yazzie, MD  Assistant- Roxie Mess, [name]   Anesthesia-  GA combined with regional for post-op pain  EBL- 25 ml   Drains None  Tourniquet time-  Total Tourniquet Time Documented: Thigh (Left) - 40 minutes Total: Thigh (Left) - 40 minutes     Complications- None  Condition-PACU - hemodynamically stable.   Brief Clinical Note  Bridget Cortez is a 79 y.o. year old female with end stage OA of her left knee with progressively worsening pain and dysfunction. She has constant pain, with activity and at rest and significant functional deficits with difficulties even with ADLs. She has had extensive non-op management including analgesics, injections of cortisone and viscosupplements, and home exercise program, but remains in significant pain with significant dysfunction. Radiographs show bone on bone arthritis lateral and patellofemoral. She presents now for left Total Knee Arthroplasty.     Procedure in detail---   The patient is brought into the operating room and positioned supine on the operating table. After successful administration of  GA combined with regional for post-op pain,   a tourniquet is placed high on the  Left thigh(s) and the lower extremity is prepped and draped in the usual sterile fashion. Time out is performed by the operating team and then the  Left lower extremity is wrapped in Esmarch, knee flexed and the tourniquet inflated to 300 mmHg.       A midline incision is made with a ten blade through the subcutaneous tissue to the level of the extensor mechanism. A fresh blade is used to make a medial parapatellar arthrotomy. Soft tissue over the proximal medial tibia is subperiosteally elevated to the joint line with a knife and into the  semimembranosus bursa with a Cobb elevator. Soft tissue over the proximal lateral tibia is elevated with attention being paid to avoiding the patellar tendon on the tibial tubercle. The patella is everted, knee flexed 90 degrees and the ACL and PCL are removed. Findings are bone on bone lateral and patellofemoral with massive global osteophytes        The drill is used to create a starting hole in the distal femur and the canal is thoroughly irrigated with sterile saline to remove the fatty contents. The 5 degree Left  valgus alignment guide is placed into the femoral canal and the distal femoral cutting block is pinned to remove 10 mm off the distal femur. Resection is made with an oscillating saw.      The tibia is subluxed forward and the menisci are removed. The extramedullary alignment guide is placed referencing proximally at the medial aspect of the tibial tubercle and distally along the second metatarsal axis and tibial crest. The block is pinned to remove 2mm off the more deficient lateral  side. Resection is made with an oscillating saw. Size 6is the most appropriate size for the tibia and the proximal tibia is prepared with the modular drill and keel punch for that size.      The femoral sizing guide is placed and size 7 is most appropriate. Rotation is marked off the epicondylar axis and confirmed by creating a rectangular flexion gap at 90 degrees. The size 7 cutting block is pinned in this rotation and the anterior, posterior and chamfer cuts are made with the oscillating saw. The intercondylar  block is then placed and that cut is made.      Trial size 6 tibial component, trial size 7 posterior stabilized femur and a 7  mm posterior stabilized rotating platform insert trial is placed. Full extension is achieved with excellent varus/valgus and anterior/posterior balance throughout full range of motion. The patella is everted and thickness measured to be 24  mm. Free hand resection is taken to 14 mm,  a 38 template is placed, lug holes are drilled, trial patella is placed, and it tracks normally. Osteophytes are removed off the posterior femur with the trial in place. All trials are removed and the cut bone surfaces prepared with pulsatile lavage. Cement is mixed and once ready for implantation, the size 6 tibial implant, size  7 posterior stabilized femoral component, and the size 38 patella are cemented in place and the patella is held with the clamp. The trial insert is placed and the knee held in full extension. The Exparel  (20 ml mixed with 60 ml saline) is injected into the extensor mechanism, posterior capsule, medial and lateral gutters and subcutaneous tissues.  All extruded cement is removed and once the cement is hard the permanent 7 mm posterior stabilized rotating platform insert is placed into the tibial tray.      The wound is copiously irrigated with saline solution and the extensor mechanism closed with # 0 Stratofix suture. The tourniquet is released for a total tourniquet time of 40  minutes. Flexion against gravity is 130 degrees and the patella tracks normally. Subcutaneous tissue is closed with 2.0 vicryl and subcuticular with running 4.0 Monocryl. The incision is cleaned and dried and steri-strips and a bulky sterile dressing are applied. The limb is placed into a knee immobilizer and the patient is awakened and transported to recovery in stable condition.      Please note that a surgical assistant was a medical necessity for this procedure in order to perform it in a safe and expeditious manner. Surgical assistant was necessary to retract the ligaments and vital neurovascular structures to prevent injury to them and also necessary for proper positioning of the limb to allow for anatomic placement of the prosthesis.   Bridget ROCKFORD Donoven Pett, MD    02/11/2024, 3:17 PM   "

## 2024-02-11 NOTE — Progress Notes (Signed)
 Orthopedic Tech Progress Note Patient Details:  Bridget Cortez 1945/02/22 [id number] Pt could not tolerate CPM at the moment. Will try again in the morning.  Patient ID: Bridget Cortez, female   DOB: [birth year], 79 y.o.   MRN: [id number]  Morna Pink 02/11/2024, 7:01 PM

## 2024-02-11 NOTE — Anesthesia Procedure Notes (Signed)
 Anesthesia Regional Block: Adductor canal block   Pre-Anesthetic Checklist: , timeout performed,  Correct Patient, Correct Site, Correct Laterality,  Correct Procedure, Correct Position, site marked,  Risks and benefits discussed,  Surgical consent,  Pre-op  evaluation,  At surgeon's request and post-op pain management  Laterality: Lower and Left  Prep: chloraprep       Needles:  Injection technique: Single-shot  Needle Type: Echogenic Needle     Needle Length: 9cm  Needle Gauge: 21     Additional Needles:   Procedures:,,,, ultrasound used (permanent image in chart),,    Narrative:  Start time: 02/11/2024 1:27 PM End time: 02/11/2024 1:29 PM Injection made incrementally with aspirations every 5 mL.  Performed by: Personally  Anesthesiologist: Boone Fess, MD  Additional Notes: Patient's chart reviewed and they were deemed appropriate candidate for procedure, per surgeon's request. Patient educated about risks, benefits, and alternatives of the block including but not limited to: temporary or permanent nerve damage, bleeding, infection, damage to surround tissues, block failure, local anesthetic toxicity. Patient expressed understanding. A formal time-out was conducted consistent with institution rules.  Monitors were applied, and minimal sedation used (see nursing record). The site was prepped with skin prep and allowed to dry, and sterile gloves were used. A high frequency linear ultrasound probe with probe cover was utilized throughout. Femoral artery visualized at mid-thigh level, local anesthetic injected anterolateral to it, and echogenic block needle trajectory was monitored throughout. Hydrodissection of saphenous nerve visualized and appeared anatomically normal. Aspiration performed every 5ml. Blood vessels were avoided. All injections were performed without resistance and free of blood and paresthesias. The patient tolerated the procedure well.  Injectate: 20ml 0.25%  bupivacaine  + 1:200,000 epinephrine 

## 2024-02-11 NOTE — Anesthesia Procedure Notes (Signed)
 Procedure Name: Intubation Date/Time: 02/11/2024 2:07 PM  Performed by: Nada Corean CROME, CRNAPre-anesthesia Checklist: Suction available, Emergency Drugs available, Patient identified, Patient being monitored and Timeout performed Patient Re-evaluated:Patient Re-evaluated prior to induction Oxygen  Delivery Method: Circle system utilized Preoxygenation: Pre-oxygenation with 100% oxygen  Induction Type: IV induction, Cricoid Pressure applied and Rapid sequence Laryngoscope Size: Mac and 3 Grade View: Grade II Tube type: Oral Tube size: 7.0 mm Number of attempts: 2 Airway Equipment and Method: Stylet Placement Confirmation: ETT inserted through vocal cords under direct vision, positive ETCO2, CO2 detector and breath sounds checked- equal and bilateral Secured at: 22 cm Tube secured with: Tape Dental Injury: Teeth and Oropharynx as per pre-operative assessment  Comments: LMA not seating effectively for adequate ventilations; MD called to bedside to convert to GETA.

## 2024-02-11 NOTE — Discharge Instructions (Signed)
 Frank Aluisio, MD Total Joint Specialist [facility] [address] [place], [place] [address] [phone]  TOTAL KNEE REPLACEMENT POSTOPERATIVE DIRECTIONS    Knee Rehabilitation, Guidelines Following Surgery  Results after knee surgery are often greatly improved when you follow the exercise, range of motion and muscle strengthening exercises prescribed by your doctor. Safety measures are also important to protect the knee from further injury. If any of these exercises cause you to have increased pain or swelling in your knee joint, decrease the amount until you are comfortable again and slowly increase them. If you have problems or questions, call your caregiver or physical therapist for advice.   HOME CARE INSTRUCTIONS  Remove items at home which could result in a fall. This includes throw rugs or furniture in walking pathways.  ICE to the affected knee as much as tolerated. Icing helps control swelling. If the swelling is well controlled you will be more comfortable and rehab easier. Continue to use ice on the knee for pain and swelling from surgery. You may notice swelling that will progress down to the foot and ankle. This is normal after surgery. Elevate the leg when you are not up walking on it.    Continue to use the breathing machine which will help keep your temperature down. It is common for your temperature to cycle up and down following surgery, especially at night when you are not up moving around and exerting yourself. The breathing machine keeps your lungs expanded and your temperature down. Do not place pillow under the operative knee, focus on keeping the knee straight while resting  DIET You may resume your previous home diet once you are discharged from the hospital.  DRESSING / WOUND CARE / SHOWERING Keep your bulky bandage on for 2 days. On the third post-operative day you may remove the Ace bandage and gauze. There is a waterproof  adhesive bandage on your skin which will stay in place until your first follow-up appointment. Once you remove this you will not need to place another bandage You may begin showering 3 days following surgery, but do not submerge the incision under water.  ACTIVITY For the first 5 days, the key is rest and control of pain and swelling Do your home exercises twice a day starting on post-operative day 3. On the days you go to physical therapy, just do the home exercises once that day. You should rest, ice and elevate the leg for 50 minutes out of every hour. Get up and walk/stretch for 10 minutes per hour. After 5 days you can increase your activity slowly as tolerated. Walk with your walker as instructed. Use the walker until you are comfortable transitioning to a cane. Walk with the cane in the opposite hand of the operative leg. You may discontinue the cane once you are comfortable and walking steadily. Avoid periods of inactivity such as sitting longer than an hour when not asleep. This helps prevent blood clots.  You may discontinue the knee immobilizer once you are able to perform a straight leg raise while lying down. You may resume a sexual relationship in one month or when given the OK by your doctor.  You may return to work once you are cleared by your doctor.  Do not drive a car for 6 weeks or until released by your surgeon.  Do not drive while taking narcotics.  TED HOSE STOCKINGS Wear the elastic stockings on both legs for three weeks following surgery during the   day. You may remove them at night for sleeping.  WEIGHT BEARING Weight bearing as tolerated with assist device (walker, cane, etc) as directed, use it as long as suggested by your surgeon or therapist, typically at least 4-6 weeks.  POSTOPERATIVE CONSTIPATION PROTOCOL Constipation - defined medically as fewer than three stools per week and severe constipation as less than one stool per week.  One of the most common issues  patients have following surgery is constipation.  Even if you have a regular bowel pattern at home, your normal regimen is likely to be disrupted due to multiple reasons following surgery.  Combination of anesthesia, postoperative narcotics, change in appetite and fluid intake all can affect your bowels.  In order to avoid complications following surgery, here are some recommendations in order to help you during your recovery period.  Colace (docusate) - Pick up an over-the-counter form of Colace or another stool softener and take twice a day as long as you are requiring postoperative pain medications.  Take with a full glass of water daily.  If you experience loose stools or diarrhea, hold the colace until you stool forms back up. If your symptoms do not get better within 1 week or if they get worse, check with your doctor. Dulcolax (bisacodyl) - Pick up over-the-counter and take as directed by the product packaging as needed to assist with the movement of your bowels.  Take with a full glass of water.  Use this product as needed if not relieved by Colace only.  MiraLax (polyethylene glycol) - Pick up over-the-counter to have on hand. MiraLax is a solution that will increase the amount of water in your bowels to assist with bowel movements.  Take as directed and can mix with a glass of water, juice, soda, coffee, or tea. Take if you go more than two days without a movement. Do not use MiraLax more than once per day. Call your doctor if you are still constipated or irregular after using this medication for 7 days in a row.  If you continue to have problems with postoperative constipation, please contact the office for further assistance and recommendations.  If you experience "the worst abdominal pain ever" or develop nausea or vomiting, please contact the office immediatly for further recommendations for treatment.  ITCHING If you experience itching with your medications, try taking only a single pain  pill, or even half a pain pill at a time.  You can also use Benadryl over the counter for itching or also to help with sleep.   MEDICATIONS See your medication summary on the "After Visit Summary" that the nursing staff will review with you prior to discharge.  You may have some home medications which will be placed on hold until you complete the course of blood thinner medication.  It is important for you to complete the blood thinner medication as prescribed by your surgeon.  Continue your approved medications as instructed at time of discharge.  PRECAUTIONS If you experience chest pain or shortness of breath - call 911 immediately for transfer to the hospital emergency department.  If you develop a fever greater that 101 F, purulent drainage from wound, increased redness or drainage from wound, foul odor from the wound/dressing, or calf pain - CONTACT YOUR SURGEON.                                                     FOLLOW-UP APPOINTMENTS Make sure you keep all of your appointments after your operation with your surgeon and caregivers. You should call the office at the above phone number and make an appointment for approximately two weeks after the date of your surgery or on the date instructed by your surgeon outlined in the "After Visit Summary".  RANGE OF MOTION AND STRENGTHENING EXERCISES  Rehabilitation of the knee is important following a knee injury or an operation. After just a few days of immobilization, the muscles of the thigh which control the knee become weakened and shrink (atrophy). Knee exercises are designed to build up the tone and strength of the thigh muscles and to improve knee motion. Often times heat used for twenty to thirty minutes before working out will loosen up your tissues and help with improving the range of motion but do not use heat for the first two weeks following surgery. These exercises can be done on a training (exercise) mat, on the floor, on a table or on a bed.  Use what ever works the best and is most comfortable for you Knee exercises include:  Leg Lifts - While your knee is still immobilized in a splint or cast, you can do straight leg raises. Lift the leg to 60 degrees, hold for 3 sec, and slowly lower the leg. Repeat 10-20 times 2-3 times daily. Perform this exercise against resistance later as your knee gets better.  Quad and Hamstring Sets - Tighten up the muscle on the front of the thigh (Quad) and hold for 5-10 sec. Repeat this 10-20 times hourly. Hamstring sets are done by pushing the foot backward against an object and holding for 5-10 sec. Repeat as with quad sets.  Leg Slides: Lying on your back, slowly slide your foot toward your buttocks, bending your knee up off the floor (only go as far as is comfortable). Then slowly slide your foot back down until your leg is flat on the floor again. Angel Wings: Lying on your back spread your legs to the side as far apart as you can without causing discomfort.  A rehabilitation program following serious knee injuries can speed recovery and prevent re-injury in the future due to weakened muscles. Contact your doctor or a physical therapist for more information on knee rehabilitation.   POST-OPERATIVE OPIOID TAPER INSTRUCTIONS: It is important to wean off of your opioid medication as soon as possible. If you do not need pain medication after your surgery it is ok to stop day one. Opioids include: Codeine, Hydrocodone(Norco, Vicodin), Oxycodone(Percocet, oxycontin) and hydromorphone amongst others.  Long term and even short term use of opiods can cause: Increased pain response Dependence Constipation Depression Respiratory depression And more.  Withdrawal symptoms can include Flu like symptoms Nausea, vomiting And more Techniques to manage these symptoms Hydrate well Eat regular healthy meals Stay active Use relaxation techniques(deep breathing, meditating, yoga) Do Not substitute Alcohol to help  with tapering If you have been on opioids for less than two weeks and do not have pain than it is ok to stop all together.  Plan to wean off of opioids This plan should start within one week post op of your joint replacement. Maintain the same interval or time between taking each dose and first decrease the dose.  Cut the total daily intake of opioids by one tablet each day Next start to increase the time between doses. The last dose that should be eliminated is the evening dose.   IF YOU ARE TRANSFERRED TO   A SKILLED REHAB FACILITY If the patient is transferred to a skilled rehab facility following release from the hospital, a list of the current medications will be sent to the facility for the patient to continue.  When discharged from the skilled rehab facility, please have the facility set up the patient's Home Health Physical Therapy prior to being released. Also, the skilled facility will be responsible for providing the patient with their medications at time of release from the facility to include their pain medication, the muscle relaxants, and their blood thinner medication. If the patient is still at the rehab facility at time of the two week follow up appointment, the skilled rehab facility will also need to assist the patient in arranging follow up appointment in our office and any transportation needs.  MAKE SURE YOU:  Understand these instructions.  Get help right away if you are not doing well or get worse.   DENTAL ANTIBIOTICS:  In most cases prophylactic antibiotics for Dental procdeures after total joint surgery are not necessary.  Exceptions are as follows:  1. History of prior total joint infection  2. Severely immunocompromised (Organ Transplant, cancer chemotherapy, Rheumatoid biologic meds such as Humera)  3. Poorly controlled diabetes (A1C &gt; 8.0, blood glucose over 200)  If you have one of these conditions, contact your surgeon for an antibiotic prescription,  prior to your dental procedure.    Pick up stool softner and laxative for home use following surgery while on pain medications. Do not submerge incision under water. Please use good hand washing techniques while changing dressing each day. May shower starting three days after surgery. Please use a clean towel to pat the incision dry following showers. Continue to use ice for pain and swelling after surgery. Do not use any lotions or creams on the incision until instructed by your surgeon.  

## 2024-02-11 NOTE — Interval H&P Note (Signed)
 History and Physical Interval Note:  02/11/2024 11:38 AM  Bridget Cortez  has presented today for surgery, with the diagnosis of Left knee osteoarthritis.  The various methods of treatment have been discussed with the patient and family. After consideration of risks, benefits and other options for treatment, the patient has consented to  Procedures: ARTHROPLASTY, KNEE, TOTAL (Left) as a surgical intervention.  The patient's history has been reviewed, patient examined, no change in status, stable for surgery.  I have reviewed the patient's chart and labs.  Questions were answered to the patient's satisfaction.     Dempsey Neira Bentsen

## 2024-02-11 NOTE — Transfer of Care (Signed)
 Immediate Anesthesia Transfer of Care Note  Patient: Bridget Cortez  Procedure(s) Performed: ARTHROPLASTY, KNEE, TOTAL (Left: Knee)  Patient Location: PACU  Anesthesia Type:General and Regional  Level of Consciousness: awake, oriented, and patient cooperative  Airway & Oxygen  Therapy: Patient Spontanous Breathing and Patient connected to face mask oxygen   Post-op Assessment: Report given to RN and Post -op Vital signs reviewed and stable  Post vital signs: Reviewed and stable  Last Vitals:  Vitals Value Taken Time  BP 143/96 1557  Temp    Pulse 73   Resp 18   SpO2 97     Last Pain:  Vitals:   02/11/24 1330  TempSrc:   PainSc: 0-No pain         Complications: No notable events documented.

## 2024-02-11 NOTE — Anesthesia Procedure Notes (Signed)
 Spinal  Start time: 02/11/2024 1:50 PM End time: 02/11/2024 1:55 PM Reason for block: surgical anesthesia  Staffing Performed: anesthesiologist  Authorized by: Boone Fess, MD   Performed by: Boone Fess, MD  Preanesthetic Checklist Completed: patient identified, IV checked, site marked, risks and benefits discussed, surgical consent, monitors and equipment checked, pre-op  evaluation and timeout performed Spinal Block Patient position: sitting Prep: ChloraPrep Patient monitoring: heart rate, cardiac monitor, continuous pulse ox and blood pressure Approach: midline Location: L3-4 Needle Needle type: Quincke  Needle gauge: 22 G Assessment Events: failed spinal  Additional Notes Attempts made at spinal, patient very uncooperative, extremely nervous and fidgety, unable to hold position appropriate for spinal facilitation. Decision made to abort and convert to general anesthesia

## 2024-02-11 NOTE — Care Plan (Signed)
 Ortho Bundle Case Management Note  Patient Details  Name: Bridget Cortez MRN: [id number] Date of Birth: Jan 29, 1945                  L TKA on 02/11/24. DCP: Home with husband.  DME: No needs. Has RW.  PT: SNF, then home with [facility] aide and husband.   DME Arranged:  N/A DME Agency:      Additional Comments: Please contact me with any questions of if this plan should need to change.    Lyle Pepper, CCM Case Manager, [facility] [phone] ext [phone] 02/11/2024, 12:47 PM

## 2024-02-12 ENCOUNTER — Telehealth: Payer: Self-pay

## 2024-02-12 DIAGNOSIS — M1712 Unilateral primary osteoarthritis, left knee: Secondary | ICD-10-CM | POA: Diagnosis not present

## 2024-02-12 LAB — CBC
HCT: 31.6 % — ABNORMAL LOW (ref 36.0–46.0)
Hemoglobin: 9.9 g/dL — ABNORMAL LOW (ref 12.0–15.0)
MCH: 26.6 pg (ref 26.0–34.0)
MCHC: 31.3 g/dL (ref 30.0–36.0)
MCV: 84.9 fL (ref 80.0–100.0)
Platelets: 332 K/uL (ref 150–400)
RBC: 3.72 MIL/uL — ABNORMAL LOW (ref 3.87–5.11)
RDW: 16.9 % — ABNORMAL HIGH (ref 11.5–15.5)
WBC: 6 K/uL (ref 4.0–10.5)
nRBC: 0 % (ref 0.0–0.2)

## 2024-02-12 LAB — BASIC METABOLIC PANEL WITH GFR
Anion gap: 11 (ref 5–15)
BUN: 10 mg/dL (ref 8–23)
CO2: 24 mmol/L (ref 22–32)
Calcium: 8.6 mg/dL — ABNORMAL LOW (ref 8.9–10.3)
Chloride: 103 mmol/L (ref 98–111)
Creatinine, Ser: 0.76 mg/dL (ref 0.44–1.00)
GFR, Estimated: >60 mL/min
Glucose, Bld: 142 mg/dL — ABNORMAL HIGH (ref 70–99)
Potassium: 4 mmol/L (ref 3.5–5.1)
Sodium: 138 mmol/L (ref 135–145)

## 2024-02-12 MED ORDER — TRAMADOL HCL 50 MG PO TABS: 50.0000 mg | ORAL_TABLET | Freq: Four times a day (QID) | ORAL | 0 refills | Status: DC | PRN

## 2024-02-12 MED ORDER — FLUTICASONE-SALMETEROL 250-50 MCG/ACT IN AEPB
1.0000 | INHALATION_SPRAY | Freq: Two times a day (BID) | RESPIRATORY_TRACT | Status: DC
  Administered 2024-02-12 – 2024-02-13 (×3): 1 via RESPIRATORY_TRACT

## 2024-02-12 MED ORDER — POLYETHYLENE GLYCOL 3350 17 G PO PACK: 17.0000 g | PACK | Freq: Every day | ORAL | 0 refills | Status: AC | PRN

## 2024-02-12 MED ORDER — METHOCARBAMOL 500 MG PO TABS: 500.0000 mg | ORAL_TABLET | Freq: Four times a day (QID) | ORAL | 0 refills | Status: AC | PRN

## 2024-02-12 MED ORDER — ONDANSETRON HCL 4 MG PO TABS: 4.0000 mg | ORAL_TABLET | Freq: Four times a day (QID) | ORAL | 0 refills | Status: AC | PRN

## 2024-02-12 MED ORDER — OXYCODONE HCL 5 MG PO TABS: 5.0000 mg | ORAL_TABLET | ORAL | 0 refills | Status: DC | PRN

## 2024-02-12 NOTE — Progress Notes (Signed)
 Orthopedic Tech Progress Note Patient Details:  Bridget Cortez [birth year] [id number]  CPM Left Knee CPM Left Knee: On Left Knee Flexion (Degrees): 40 Left Knee Extension (Degrees): 10  Post Interventions Patient Tolerated: Well Instructions Provided: Adjustment of device, Care of device  [name] 02/12/2024, 4:21 PM

## 2024-02-12 NOTE — NC FL2 (Signed)
 " [place]  MEDICAID [id number] LEVEL OF CARE FORM     IDENTIFICATION  Patient Name: Bridget Cortez Birthdate: 14-Oct-1945 Sex: female Admission Date (Current Location): 02/11/2024  [facility] and [place] Number:  Producer, Television/film/video and Address:  [facility],  [address] [place], [place] [address]      Provider Number: [id number]  Attending Physician Name and Address:  Melodi Lerner, MD  Relative Name and Phone Number:  Taeya, Theall, Emergency Contact  [phone] (Mobile)    Current Level of Care: Hospital Recommended Level of Care: Skilled Nursing Facility Prior Approval Number:    Date Approved/Denied:   PASRR Number: [id number]  Discharge Plan: SNF    Current Diagnoses: Patient Active Problem List   Diagnosis Date Noted   Primary osteoarthritis of left knee 02/11/2024   Anemia 12/07/2023   PAF (paroxysmal atrial fibrillation) (HCC)/Flutter 10/04/2022   Leukocytopenia 09/15/2020   Deficiency anemia 09/15/2020   Persistent atrial fibrillation (HCC)    OA (osteoarthritis) of knee 09/09/2019   (HFpEF) heart failure with preserved ejection fraction (HCC) 04/13/2019   SVT (supraventricular tachycardia) 08/04/2017   Wide-complex tachycardia 08/03/2017   Asthma 08/06/2014   Anxiety about health 08/06/2014   Essential hypertension 08/06/2014   History of colonic polyps 03/20/2012   Chiari malformation    Heart murmur    H/O varicella    Yeast infection    Cancer (HCC)    H/O osteopenia    Abnormal Pap smear    Vulvar intraepithelial neoplasia III (VIN III) 04/12/2009   Atrophy of vagina 06/23/2005    Orientation RESPIRATION BLADDER Height & Weight     Self, Time, Situation, Place  Normal Continent, External catheter Weight: 104.3 kg Height:  5' 7 (170.2 cm)  BEHAVIORAL SYMPTOMS/MOOD NEUROLOGICAL BOWEL NUTRITION STATUS      Continent Diet (regular)  AMBULATORY STATUS COMMUNICATION OF NEEDS Skin     Verbally Surgical wounds (L TKA)                        Personal Care Assistance Level of Assistance  Bathing, Feeding, Dressing Bathing Assistance: Limited assistance Feeding assistance: Limited assistance Dressing Assistance: Limited assistance     Functional Limitations Info  Sight, Hearing, Speech Sight Info: Impaired (eyeglasses) Hearing Info: Adequate      SPECIAL CARE FACTORS FREQUENCY  PT (By licensed PT), OT (By licensed OT)     PT Frequency: 5x/wk OT Frequency: 5x/wk            Contractures Contractures Info: Not present    Additional Factors Info  Code Status, Allergies, Psychotropic Code Status Info: Full code Allergies Info: Ephedrine , Cortisone, Fluticasone  Propionate Psychotropic Info: N/A         Current Medications (02/12/2024):  This is the current hospital active medication list Current Facility-Administered Medications  Medication Dose Route Frequency Provider Last Rate Last Admin   0.9 %  sodium chloride  infusion   Intravenous Continuous Edmisten, Kristie L, PA 75 mL/hr at 02/11/24 2056 New Bag at 02/11/24 2056   acetaminophen  (TYLENOL ) tablet 1,000 mg  1,000 mg Oral Q6H Edmisten, Kristie L, PA   1,000 mg at 02/12/24 1200   acetaminophen  (TYLENOL ) tablet 325-650 mg  325-650 mg Oral Q6H PRN Edmisten, Kristie L, PA       albuterol  (PROVENTIL ) (2.5 MG/3ML) 0.083% nebulizer solution 2.5 mg  2.5 mg Nebulization Q6H PRN Edmisten, Kristie L, PA       amLODipine  (NORVASC ) tablet 5 mg  5  mg Oral QHS Edmisten, Kristie L, PA       bisacodyl  (DULCOLAX) suppository 10 mg  10 mg Rectal Daily PRN Edmisten, Kristie L, PA       diphenhydrAMINE  (BENADRYL ) 12.5 MG/5ML elixir 12.5-25 mg  12.5-25 mg Oral Q4H PRN Edmisten, Kristie L, PA       docusate sodium  (COLACE) capsule 100 mg  100 mg Oral BID Edmisten, Kristie L, PA   100 mg at 02/12/24 9173   ferrous sulfate  tablet 325 mg  325 mg Oral Once per day on Monday Thursday [name], PA   325 mg at 02/11/24 2109   fluticasone   furoate-vilanterol (BREO ELLIPTA ) 200-25 MCG/ACT 1 puff  1 puff Inhalation Daily Edmisten, Kristie L, PA       HYDROmorphone  (DILAUDID ) injection 0.5-1 mg  0.5-1 mg Intravenous Q4H PRN Edmisten, Kristie L, PA       irbesartan  (AVAPRO ) tablet 150 mg  150 mg Oral Daily Edmisten, Kristie L, PA   150 mg at 02/12/24 0825   menthol  (CEPACOL) lozenge 3 mg  1 lozenge Oral PRN Edmisten, Kristie L, PA       Or   phenol (CHLORASEPTIC) mouth spray 1 spray  1 spray Mouth/Throat PRN Edmisten, Kristie L, PA       methocarbamol  (ROBAXIN ) tablet 500 mg  500 mg Oral Q6H PRN Edmisten, Kristie L, PA   500 mg at 02/12/24 0134   Or   methocarbamol  (ROBAXIN ) injection 500 mg  500 mg Intravenous Q6H PRN Edmisten, Kristie L, PA       metoCLOPramide  (REGLAN ) tablet 5-10 mg  5-10 mg Oral Q8H PRN Edmisten, Kristie L, PA       Or   metoCLOPramide  (REGLAN ) injection 5-10 mg  5-10 mg Intravenous Q8H PRN Edmisten, Kristie L, PA       nebivolol  (BYSTOLIC ) tablet 5 mg  5 mg Oral Daily Edmisten, Kristie L, PA   5 mg at 02/12/24 9171   ondansetron  (ZOFRAN ) tablet 4 mg  4 mg Oral Q6H PRN Edmisten, Kristie L, PA       Or   ondansetron  (ZOFRAN ) injection 4 mg  4 mg Intravenous Q6H PRN Edmisten, Kristie L, PA   4 mg at 02/11/24 2230   Oral care mouth rinse  15 mL Mouth Rinse PRN Aluisio, Dempsey, MD       oxyCODONE  (Oxy IR/ROXICODONE ) immediate release tablet 5-10 mg  5-10 mg Oral Q4H PRN Edmisten, Kristie L, PA   5 mg at 02/12/24 0825   pantoprazole  (PROTONIX ) EC tablet 40 mg  40 mg Oral Daily Edmisten, Kristie L, PA   40 mg at 02/12/24 0825   polyethylene glycol (MIRALAX  / GLYCOLAX ) packet 17 g  17 g Oral Daily PRN Edmisten, Kristie L, PA       potassium chloride  SA (KLOR-CON  M) CR tablet 20 mEq  20 mEq Oral TID Edmisten, Kristie L, PA   20 mEq at 02/12/24 0825   rivaroxaban  (XARELTO ) tablet 10 mg  10 mg Oral Q breakfast Edmisten, Kristie L, PA   10 mg at 02/12/24 9173   rosuvastatin  (CRESTOR ) tablet 5 mg  5 mg Oral QHS Edmisten,  Kristie L, PA       sodium phosphate  (FLEET) enema 1 enema  1 enema Rectal Once PRN Edmisten, Kristie L, PA       torsemide  (DEMADEX ) tablet 20 mg  20 mg Oral Daily Edmisten, Kristie L, PA   20 mg at 02/12/24 0828   traMADol  (ULTRAM ) tablet 50-100  mg  50-100 mg Oral Q6H PRN Edmisten, Kristie L, PA   100 mg at 02/12/24 1159     Discharge Medications: Please see discharge summary for a list of discharge medications.  Relevant Imaging Results:  Relevant Lab Results:   Additional Information SSN: [id number]  [name], RN     "

## 2024-02-12 NOTE — Progress Notes (Signed)
 Orthopedic Tech Progress Note Patient Details:  Bridget Cortez Jun 01, 1945 [id number]  CPM Left Knee CPM Left Knee: Off Left Knee Flexion (Degrees): 40 Left Knee Extension (Degrees): 10  Post Interventions Patient Tolerated: Well Instructions Provided: Adjustment of device, Care of device  [name] 02/12/2024, 6:42 PM

## 2024-02-12 NOTE — Plan of Care (Signed)
" °  Problem: Pain Management: Goal: Pain level will decrease with appropriate interventions Outcome: Progressing   Problem: Activity: Goal: Range of joint motion will improve Outcome: Progressing   Problem: Education: Goal: Knowledge of the prescribed therapeutic regimen will improve Outcome: Progressing   Problem: Urinary Elimination: Goal: Ability to achieve and maintain adequate urine output Outcome: Progressing   "

## 2024-02-12 NOTE — Evaluation (Signed)
 Physical Therapy Evaluation Patient Details Name: Bridget Cortez MRN: [id number] DOB: 1945-12-22 Today's Date: 02/12/2024  History of Present Illness  Pt s/p L TKR and with hx of R TKR, PAF and CHiari malformation  Clinical Impression  Pt s/p L TKR and presents with functional mobility limitations 2* generalized weakness/deconditioning, decreased L LE strength/ROM, post op pain, and very limited activity tolerance.  Pt reports plan at DC is rehab at [facility] for 10 days. Patient will benefit from continued inpatient follow up therapy, <3 hours/day       If plan is discharge home, recommend the following: Two people to help with walking and/or transfers;A lot of help with bathing/dressing/bathroom;Assistance with cooking/housework;Help with stairs or ramp for entrance;Assist for transportation   Can travel by private vehicle   No    Equipment Recommendations None recommended by PT  Recommendations for Other Services       Functional Status Assessment Patient has had a recent decline in their functional status and demonstrates the ability to make significant improvements in function in a reasonable and predictable amount of time.     Precautions / Restrictions Precautions Precautions: Knee;Fall Restrictions Weight Bearing Restrictions Per Provider Order: Yes LLE Weight Bearing Per Provider Order: Weight bearing as tolerated      Mobility  Bed Mobility Overal bed mobility: Needs Assistance Bed Mobility: Supine to Sit     Supine to sit: Mod assist, +2 for physical assistance, +2 for safety/equipment, Used rails, HOB elevated     General bed mobility comments: INcreased time with cues for sequence and assist to manage LEs as well as bring trunk to upright    Transfers Overall transfer level: Needs assistance Equipment used: Rolling walker (2 wheels) Transfers: Sit to/from Stand, Bed to chair/wheelchair/BSC Sit to Stand: From elevated surface, Mod assist, +2  physical assistance, +2 safety/equipment   Step pivot transfers: Min assist, Mod assist, +2 physical assistance, +2 safety/equipment       General transfer comment: Cues for LE management and use of UEs to self assist.  Physical assist to bring wt up and fwd and to balance in initial standing with RW.  Step pvt bed to [facility] to recliner    Ambulation/Gait Ambulation/Gait assistance: Min assist, Mod assist, +2 safety/equipment Gait Distance (Feet): 7 Feet Assistive device: Rolling walker (2 wheels) Gait Pattern/deviations: Step-to pattern, Decreased step length - right, Decreased step length - left, Shuffle, Trunk flexed Gait velocity: decr     General Gait Details: cues for seqeunce, posture and postion from RW; distance ltd by pain/fatigue  Stairs            Wheelchair Mobility     Tilt Bed    Modified Rankin (Stroke Patients Only)       Balance Overall balance assessment: Needs assistance Sitting-balance support: No upper extremity supported, Feet supported Sitting balance-Leahy Scale: Good     Standing balance support: Bilateral upper extremity supported Standing balance-Leahy Scale: Poor                               Pertinent Vitals/Pain Pain Assessment Pain Assessment: 0-10 Pain Score: 6  Pain Location: L knee Pain Descriptors / Indicators: Aching, Grimacing, Guarding, Sore Pain Intervention(s): Limited activity within patient's tolerance, Monitored during session, Premedicated before session, Ice applied    Home Living Family/patient expects to be discharged to:: Skilled nursing facility Living Arrangements: Spouse/significant other Available Help at Discharge: Family;Available 24 hours/day Type of  Home: House Home Access: Stairs to enter Entrance Stairs-Rails: Can reach both;Left;Right Entrance Stairs-Number of Steps: 3   Home Layout: Able to live on main level with bedroom/bathroom;Two level Home Equipment: [employer]ant (2  wheels);Shower seat;Cane - single point      Prior Function Prior Level of Function : Independent/Modified Independent             Mobility Comments: Using RW vs cane; limited distance       Extremity/Trunk Assessment   Upper Extremity Assessment Upper Extremity Assessment: Generalized weakness    Lower Extremity Assessment Lower Extremity Assessment: Generalized weakness;RLE deficits/detail;LLE deficits/detail RLE Deficits / Details: Knee flex limited ~ 90 in sitting LLE: Unable to fully assess due to pain    Cervical / Trunk Assessment Cervical / Trunk Assessment: Kyphotic  Communication   Communication Communication: No apparent difficulties    Cognition Arousal: Alert Behavior During Therapy: WFL for tasks assessed/performed   PT - Cognitive impairments: No apparent impairments                         Following commands: Intact       Cueing Cueing Techniques: Verbal cues, Gestural cues     General Comments      Exercises Total Joint Exercises Ankle Circles/Pumps: AROM, Both, 15 reps, Supine   Assessment/Plan    PT Assessment Patient needs continued PT services  PT Problem List Decreased strength;Decreased range of motion;Decreased activity tolerance;Decreased balance;Decreased mobility;Decreased knowledge of use of DME;Obesity;Pain       PT Treatment Interventions DME instruction;Gait training;Stair training;Functional mobility training;Therapeutic activities;Therapeutic exercise;Patient/family education;Balance training    PT Goals (Current goals can be found in the Care Plan section)  Acute Rehab PT Goals Patient Stated Goal: Regain IND PT Goal Formulation: With patient Time For Goal Achievement: 02/19/24 Potential to Achieve Goals: Good    Frequency 7X/week     Co-evaluation               AM-PAC PT 6 Clicks Mobility  Outcome Measure Help needed turning from your back to your side while in a flat bed without using  bedrails?: A Lot Help needed moving from lying on your back to sitting on the side of a flat bed without using bedrails?: A Lot Help needed moving to and from a bed to a chair (including a wheelchair)?: A Lot Help needed standing up from a chair using your arms (e.g., wheelchair or bedside chair)?: A Lot Help needed to walk in hospital room?: Total Help needed climbing 3-5 steps with a railing? : Total 6 Click Score: 10    End of Session Equipment Utilized During Treatment: Gait belt;Left knee immobilizer Activity Tolerance: Patient limited by fatigue;Patient limited by pain Patient left: in chair;with call bell/phone within reach;with chair alarm set Nurse Communication: Mobility status PT Visit Diagnosis: Difficulty in walking, not elsewhere classified (R26.2);Muscle weakness (generalized) (M62.81)    Time: 8991-8966 PT Time Calculation (min) (ACUTE ONLY): 25 min   Charges:   PT Evaluation $PT Eval Low Complexity: 1 Low PT Treatments $Therapeutic Activity: 8-22 mins PT General Charges $$ ACUTE PT VISIT: 1 Visit         [facility] PT Acute Rehabilitation Services Office [phone]   Bridget Cortez 02/12/2024, 1:24 PM

## 2024-02-12 NOTE — Progress Notes (Signed)
 Physical Therapy Treatment Patient Details Name: Bridget Cortez MRN: [id number] DOB: [birth year] Today's Date: 02/12/2024   History of Present Illness Pt s/p L TKR and with hx of R TKR, PAF and CHiari malformation    PT Comments  Pt continues cooperative but progress limited 2* pain and pt fatigues very easily.  Pt requesting bathroom on arrival to room and assisted to [facility] and from [facility] to ambulate very limited distance in room and return to bed.  RN present to assist with hygiene post toileting.    If plan is discharge home, recommend the following: Two people to help with walking and/or transfers;A lot of help with bathing/dressing/bathroom;Assistance with cooking/housework;Help with stairs or ramp for entrance;Assist for transportation   Can travel by private vehicle     No  Equipment Recommendations  None recommended by PT    Recommendations for Other Services       Precautions / Restrictions Precautions Precautions: Knee;Fall Restrictions Weight Bearing Restrictions Per Provider Order: Yes LLE Weight Bearing Per Provider Order: Weight bearing as tolerated     Mobility  Bed Mobility Overal bed mobility: Needs Assistance Bed Mobility: Sit to Supine     Supine to sit: Mod assist, +2 for physical assistance, +2 for safety/equipment, Used rails, HOB elevated Sit to supine: Mod assist, +2 for physical assistance, +2 for safety/equipment, Used rails   General bed mobility comments: INcreased time with cues for sequence and assist to manage LEs as well as to control trunk    Transfers Overall transfer level: Needs assistance Equipment used: Rolling walker (2 wheels) Transfers: Sit to/from Stand, Bed to chair/wheelchair/BSC Sit to Stand: From elevated surface, Mod assist, +2 physical assistance, +2 safety/equipment   Step pivot transfers: Min assist, Mod assist, +2 physical assistance, +2 safety/equipment       General transfer comment: Cues for LE management and  use of UEs to self assist.  Physical assist to bring wt up and fwd and to balance in initial standing with RW.  Step pvt recliner to [facility] to bedside    Ambulation/Gait Ambulation/Gait assistance: Min assist, Mod assist, +2 safety/equipment Gait Distance (Feet): 5 Feet Assistive device: Rolling walker (2 wheels) Gait Pattern/deviations: Step-to pattern, Decreased step length - right, Decreased step length - left, Shuffle, Trunk flexed Gait velocity: decr     General Gait Details: cues for seqeunce, posture and postion from RW; distance ltd by pain/fatigue   Stairs             Wheelchair Mobility     Tilt Bed    Modified Rankin (Stroke Patients Only)       Balance Overall balance assessment: Needs assistance Sitting-balance support: No upper extremity supported, Feet supported Sitting balance-Leahy Scale: Good     Standing balance support: Bilateral upper extremity supported Standing balance-Leahy Scale: Poor                              Communication Communication Communication: No apparent difficulties  Cognition Arousal: Alert Behavior During Therapy: WFL for tasks assessed/performed   PT - Cognitive impairments: No apparent impairments                         Following commands: Intact      Cueing Cueing Techniques: Verbal cues, Gestural cues  Exercises Total Joint Exercises Ankle Circles/Pumps: AROM, Both, 15 reps, Supine    General Comments  Pertinent Vitals/Pain Pain Assessment Pain Assessment: 0-10 Pain Score: 6  Pain Location: L knee Pain Descriptors / Indicators: Aching, Grimacing, Guarding, Sore Pain Intervention(s): Limited activity within patient's tolerance, Premedicated before session, Monitored during session, Ice applied    Home Living Family/patient expects to be discharged to:: Skilled nursing facility Living Arrangements: Spouse/significant other Available Help at Discharge: Family;Available 24  hours/day Type of Home: House Home Access: Stairs to enter Entrance Stairs-Rails: Can reach both;Left;Right Entrance Stairs-Number of Steps: 3   Home Layout: Able to live on main level with bedroom/bathroom;Two level Home Equipment: [employer]ant (2 wheels);Shower seat;Cane - single point      Prior Function            PT Goals (current goals can now be found in the care plan section) Acute Rehab PT Goals Patient Stated Goal: Regain IND PT Goal Formulation: With patient Time For Goal Achievement: 02/19/24 Potential to Achieve Goals: Good Progress towards PT goals: Progressing toward goals    Frequency    7X/week      PT Plan      Co-evaluation              AM-PAC PT 6 Clicks Mobility   Outcome Measure  Help needed turning from your back to your side while in a flat bed without using bedrails?: A Lot Help needed moving from lying on your back to sitting on the side of a flat bed without using bedrails?: A Lot Help needed moving to and from a bed to a chair (including a wheelchair)?: A Lot Help needed standing up from a chair using your arms (e.g., wheelchair or bedside chair)?: A Lot Help needed to walk in hospital room?: Total Help needed climbing 3-5 steps with a railing? : Total 6 Click Score: 10    End of Session Equipment Utilized During Treatment: Gait belt;Left knee immobilizer Activity Tolerance: Patient limited by fatigue;Patient limited by pain Patient left: with call bell/phone within reach;in bed;with bed alarm set;with nursing/sitter in room Nurse Communication: Mobility status PT Visit Diagnosis: Difficulty in walking, not elsewhere classified (R26.2);Muscle weakness (generalized) (M62.81)     Time: 8789-8768 PT Time Calculation (min) (ACUTE ONLY): 21 min  Charges:    $Therapeutic Activity: 8-22 mins PT General Charges $$ ACUTE PT VISIT: 1 Visit                     [facility] PT Acute Rehabilitation Services Office  [phone]    Shacoria Latif 02/12/2024, 1:30 PM

## 2024-02-12 NOTE — Care Management Obs Status (Signed)
 MEDICARE OBSERVATION STATUS NOTIFICATION   Patient Details  Name: Bridget Cortez MRN: [id number] Date of Birth: 1945-07-05   Medicare Observation Status Notification Given:  Yes    [name], RN 02/12/2024, 11:37 AM

## 2024-02-12 NOTE — TOC Initial Note (Addendum)
 Transition of Care [facility]) - Initial/Assessment Note    Patient Details  Name: Bridget Cortez MRN: [id number] Date of Birth: Oct 11, 1945  Transition of Care [facility]) CM/SW Contact:    [name], RN Phone Number: 02/12/2024, 1:02 PM  Clinical Narrative:     Met with patient at bedside to introduce role of INPT CM and review for dc planning, patient admitted for scheduled L TK with discharge plan for short term rehab/SNF at discharge, pt prefers [facility]. FL2 updated, MOON completed.   Confirmed with [facility] patient is eligible for 3-day SNF waiver.      -1:30pm Call to [facility], admit coordinator w/[facility], reports no bed available at this time but will continue to monitor, faxed out for bed  offers.         Expected Discharge Plan: Skilled Nursing Facility Barriers to Discharge: Continued Medical Work up   Patient Goals and CMS Choice Patient states their goals for this hospitalization and ongoing recovery are:: return home   Choice offered to / list presented to : Patient [place] ownership interest in [facility].provided to:: Patient    Expected Discharge Plan and Services       Living arrangements for the past 2 months: Single Family Home Expected Discharge Date: 02/12/24               DME Arranged: N/A                    Prior Living Arrangements/Services Living arrangements for the past 2 months: Single Family Home Lives with:: Spouse Patient language and need for interpreter reviewed:: Yes Do you feel safe going back to the place where you live?: Yes      Need for Family Participation in Patient Care: Yes (Comment) Care giver support system in place?: Yes (comment)   Criminal Activity/Legal Involvement Pertinent to Current Situation/Hospitalization: No - Comment as needed  Activities of Daily Living   ADL Screening (condition at time of admission) Independently performs ADLs?: Yes (appropriate for developmental age) Is the patient deaf  or have difficulty hearing?: Yes Does the patient have difficulty seeing, even when wearing glasses/contacts?: No Does the patient have difficulty concentrating, remembering, or making decisions?: Yes  Permission Sought/Granted                  Emotional Assessment Appearance:: Appears stated age Attitude/Demeanor/Rapport: Engaged Affect (typically observed): Accepting Orientation: : Oriented to Self, Oriented to Place, Oriented to  Time, Oriented to Situation Alcohol  / Substance Use: Not Applicable Psych Involvement: No (comment)  Admission diagnosis:  Primary osteoarthritis of left knee [M17.12] Patient Active Problem List   Diagnosis Date Noted   Primary osteoarthritis of left knee 02/11/2024   Anemia 12/07/2023   PAF (paroxysmal atrial fibrillation) (HCC)/Flutter 10/04/2022   Leukocytopenia 09/15/2020   Deficiency anemia 09/15/2020   Persistent atrial fibrillation (HCC)    OA (osteoarthritis) of knee 09/09/2019   (HFpEF) heart failure with preserved ejection fraction (HCC) 04/13/2019   SVT (supraventricular tachycardia) 08/04/2017   Wide-complex tachycardia 08/03/2017   Asthma 08/06/2014   Anxiety about health 08/06/2014   Essential hypertension 08/06/2014   History of colonic polyps 03/20/2012   Chiari malformation    Heart murmur    H/O varicella    Yeast infection    Cancer (HCC)    H/O osteopenia    Abnormal Pap smear    Vulvar intraepithelial neoplasia III (VIN III) 04/12/2009   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 [place] [address] Phone: [phone] Fax: [phone]     Social Drivers of Health (SDOH) Social History: SDOH Screenings   Food Insecurity: No Food Insecurity (02/11/2024)  Housing: Low Risk (02/11/2024)  Recent Concern: Housing - Medium Risk (01/21/2024)   Received from [facility]  Transportation Needs: No  Transportation Needs (02/11/2024)  Utilities: Not At Risk (02/11/2024)  Depression (PHQ2-9): Low Risk (12/07/2023)  Tobacco Use: Medium Risk (02/11/2024)   SDOH Interventions:     Readmission Risk Interventions     No data to display

## 2024-02-12 NOTE — Progress Notes (Signed)
 "  Subjective: 1 Day Post-Op Procedures (LRB): ARTHROPLASTY, KNEE, TOTAL (Left) Patient reports pain as moderate.   Patient seen in rounds by Dr. Melodi. Patient had issues with pain yesterday but is improved this morning. Did not receive spinal, so this was expected. No other issues overnight. Voiding without difficulty.  We will begin therapy today.  Objective: Vital signs in last 24 hours: Temp:  [97.7 F (36.5 C)-98.7 F (37.1 C)] 98.7 F (37.1 C) (01/20 0515) Pulse Rate:  [69-92] 75 (01/20 0515) Resp:  [11-21] 18 (01/20 0515) BP: (129-162)/(78-126) 152/91 (01/20 0515) SpO2:  [94 %-100 %] 100 % (01/20 0515) Weight:  [104.3 kg] 104.3 kg (01/19 1225)  Intake/Output from previous day:  Intake/Output Summary (Last 24 hours) at 02/12/2024 0803 Last data filed at 02/12/2024 0600 Gross per 24 hour  Intake 2060.53 ml  Output 100 ml  Net 1960.53 ml     Intake/Output this shift: No intake/output data recorded.  Labs: Recent Labs    02/12/24 0344  HGB 9.9*   Recent Labs    02/12/24 0344  WBC 6.0  RBC 3.72*  HCT 31.6*  PLT 332   Recent Labs    02/12/24 0344  NA 138  K 4.0  CL 103  CO2 24  BUN 10  CREATININE 0.76  GLUCOSE 142*  CALCIUM  8.6*   No results for input(s): LABPT, INR in the last 72 hours.  Exam: General - Patient is Alert and Oriented Extremity - Neurologically intact Neurovascular intact Sensation intact distally Dorsiflexion/Plantar flexion intact Dressing - dressing C/D/I Motor Function - intact, moving foot and toes well on exam.   Past Medical History:  Diagnosis Date   Abnormal Pap smear 2006   vaginal medicine according to patient   Allergy    Arthritis    Asthma    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 eye    cataract removed.   Chiari malformation    Complication of anesthesia    woke up during left breast surgery age 31    Diverticulosis    Dysrhythmia    SVT   GERD (gastroesophageal reflux disease)    H/O osteopenia    08/2006   H/O varicella    Heart murmur    Hypertension    Monilial vaginitis 07/2007   Multiple allergies    Ovarian cyst    Personal history of colonic adenomas 03/13/2012   Personal history of radiation therapy 1990   Pneumonia 07/2014   VIN III (vulvar intraepithelial neoplasia III) 03/2009   Yeast infection     Assessment/Plan: 1 Day Post-Op Procedures (LRB): ARTHROPLASTY, KNEE, TOTAL (Left) Principal Problem:   OA (osteoarthritis) of knee Active Problems:   Primary osteoarthritis of left knee  Estimated body mass index is 36.02 kg/m as calculated from the following:   Height as of this encounter: 5' 7 (1.702 m).   Weight as of this encounter: 104.3 kg. Advance diet Up with therapy D/C IV fluids   Patient's anticipated LOS is less than 2 midnights, meeting these requirements: - Lives within 1 hour of care - Has a competent adult at home to recover with post-op recover - NO history of             - Chronic pain requiring opiods             - Diabetes             -  Coronary Artery Disease             - Heart failure             - Heart attack             - Stroke             - DVT/VTE             - Respiratory Failure/COPD             - Renal failure             - Advanced Liver disease  DVT Prophylaxis - Xarelto  Weight bearing as tolerated. Begin therapy.  Plan is to go to Skilled Nursing Facility after hospital stay. Social work consulted to begin SNF bed search. Discharge once bed available.   Roxie Mess, [name] Orthopedic Surgery [phone] 02/12/2024, 8:03 AM  "

## 2024-02-13 ENCOUNTER — Encounter (HOSPITAL_COMMUNITY): Payer: Self-pay | Admitting: Orthopedic Surgery

## 2024-02-13 DIAGNOSIS — M1712 Unilateral primary osteoarthritis, left knee: Secondary | ICD-10-CM | POA: Diagnosis not present

## 2024-02-13 LAB — CBC
HCT: 29.8 % — ABNORMAL LOW (ref 36.0–46.0)
Hemoglobin: 9.5 g/dL — ABNORMAL LOW (ref 12.0–15.0)
MCH: 26.6 pg (ref 26.0–34.0)
MCHC: 31.9 g/dL (ref 30.0–36.0)
MCV: 83.5 fL (ref 80.0–100.0)
Platelets: 286 K/uL (ref 150–400)
RBC: 3.57 MIL/uL — ABNORMAL LOW (ref 3.87–5.11)
RDW: 16.9 % — ABNORMAL HIGH (ref 11.5–15.5)
WBC: 6.4 K/uL (ref 4.0–10.5)
nRBC: 0.3 % — ABNORMAL HIGH (ref 0.0–0.2)

## 2024-02-13 NOTE — Plan of Care (Signed)
" °  Problem: Education: Goal: Knowledge of General Education information will improve Description: Including pain rating scale, medication(s)/side effects and non-pharmacologic comfort measures Outcome: Progressing   Problem: Health Behavior/Discharge Planning: Goal: Ability to manage health-related needs will improve Outcome: Progressing   Problem: Clinical Measurements: Goal: Ability to maintain clinical measurements within normal limits will improve Outcome: Progressing Goal: Will remain free from infection Outcome: Progressing Goal: Diagnostic test results will improve Outcome: Adequate for Discharge Goal: Respiratory complications will improve Outcome: Progressing Goal: Cardiovascular complication will be avoided Outcome: Progressing   Problem: Activity: Goal: Risk for activity intolerance will decrease Outcome: Adequate for Discharge   Problem: Nutrition: Goal: Adequate nutrition will be maintained Outcome: Completed/Met   Problem: Coping: Goal: Level of anxiety will decrease Outcome: Progressing   Problem: Elimination: Goal: Will not experience complications related to bowel motility Outcome: Progressing Goal: Will not experience complications related to urinary retention Outcome: Completed/Met   Problem: Pain Managment: Goal: General experience of comfort will improve and/or be controlled Outcome: Progressing   Problem: Safety: Goal: Ability to remain free from injury will improve Outcome: Progressing   Problem: Skin Integrity: Goal: Risk for impaired skin integrity will decrease Outcome: Progressing   Problem: Education: Goal: Knowledge of the prescribed therapeutic regimen will improve Outcome: Progressing   Problem: Bowel/Gastric: Goal: Gastrointestinal status for postoperative course will improve Outcome: Progressing   Problem: Cardiac: Goal: Ability to maintain an adequate cardiac output Outcome: Adequate for Discharge Goal: Will show no  evidence of cardiac arrhythmias Outcome: Completed/Met   Problem: Nutritional: Goal: Will attain and maintain optimal nutritional status Outcome: Completed/Met   Problem: Neurological: Goal: Will regain or maintain usual level of consciousness Outcome: Completed/Met   Problem: Clinical Measurements: Goal: Ability to maintain clinical measurements within normal limits Outcome: Progressing Goal: Postoperative complications will be avoided or minimized Outcome: Progressing   Problem: Respiratory: Goal: Will regain and/or maintain adequate ventilation Outcome: Completed/Met Goal: Respiratory status will improve Outcome: Progressing   Problem: Skin Integrity: Goal: Demonstrates signs of wound healing without infection Outcome: Progressing   Problem: Urinary Elimination: Goal: Will remain free from infection Outcome: Progressing Goal: Ability to achieve and maintain adequate urine output Outcome: Completed/Met   Problem: Education: Goal: Knowledge of the prescribed therapeutic regimen will improve Outcome: Progressing Goal: Individualized Educational Video(s) Outcome: Progressing   Problem: Activity: Goal: Ability to avoid complications of mobility impairment will improve Outcome: Progressing Goal: Range of joint motion will improve Outcome: Progressing   Problem: Clinical Measurements: Goal: Postoperative complications will be avoided or minimized Outcome: Progressing   Problem: Pain Management: Goal: Pain level will decrease with appropriate interventions Outcome: Progressing   Problem: Skin Integrity: Goal: Will show signs of wound healing Outcome: Progressing   "

## 2024-02-13 NOTE — Plan of Care (Signed)
" °  Problem: Pain Managment: Goal: General experience of comfort will improve and/or be controlled Outcome: Progressing   Problem: Safety: Goal: Ability to remain free from injury will improve Outcome: Progressing   Problem: Respiratory: Goal: Respiratory status will improve Outcome: Progressing   Problem: Pain Management: Goal: Pain level will decrease with appropriate interventions Outcome: Progressing   "

## 2024-02-13 NOTE — Discharge Summary (Addendum)
 Physician Discharge Summary   Patient ID: Bridget Cortez MRN: [id number] DOB/AGE: February 11, 1945 79 y.o.  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 eye    cataract removed.   Chiari malformation    Complication of anesthesia    woke up during left breast surgery age 35   Diverticulosis    Dysrhythmia    SVT   GERD (gastroesophageal reflux disease)    H/O osteopenia    08/2006   H/O varicella    Heart murmur    Hypertension    Monilial vaginitis 07/2007   Multiple allergies    Ovarian cyst    Personal history of colonic adenomas 03/13/2012   Personal history of radiation therapy 1990   Pneumonia 07/2014   VIN III (vulvar intraepithelial neoplasia III) 03/2009   Yeast infection    Discharge Diagnoses:   Principal Problem:   OA (osteoarthritis) of knee Active Problems:   Primary osteoarthritis of left knee  Estimated body mass index is 36.02 kg/m as calculated from the following:   Height as of this encounter: 5' 7 (1.702 m).   Weight as of this encounter: 104.3 kg.  Procedure:  Procedures (LRB): ARTHROPLASTY, KNEE, TOTAL (Left)   Consults: None  HPI:  Bridget Cortez, 79 y.o. female has a history of pain and functional disability in the left knee due to arthritis and has failed non-surgical conservative treatments for greater than 12 weeks to include NSAID's and/or analgesics, corticosteriod injections, viscosupplementation injections, use of assistive devices, weight reduction as appropriate, and activity modification. Onset of symptoms was gradual, starting several years ago with gradually  worsening course since that time. The patient noted no past surgery on the left knee.  Patient currently rates pain in the left knee at 8 out of 10 with activity. Patient has worsening of pain with activity and weight bearing and pain that interferes with activities of daily living. Patient has evidence of tricompartmental bone-on-bone changesby imaging studies. There is no active infection.   Laboratory Data: Admission on 02/11/2024  Component Date Value Ref Range Status   WBC 02/12/2024 6.0  4.0 - 10.5 K/uL Final   RBC 02/12/2024 3.72 (L)  3.87 - 5.11 MIL/uL Final   Hemoglobin 02/12/2024 9.9 (L)  12.0 - 15.0 g/dL Final   HCT 98/79/7973 31.6 (L)  36.0 - 46.0 % Final   MCV 02/12/2024 84.9  80.0 - 100.0 fL Final   MCH 02/12/2024 26.6  26.0 - 34.0 pg Final   MCHC 02/12/2024 31.3  30.0 - 36.0 g/dL Final   RDW 98/79/7973 16.9 (H)  11.5 - 15.5 % Final   Platelets 02/12/2024 332  150 - 400 K/uL Final   nRBC 02/12/2024 0.0  0.0 - 0.2 % Final   Performed at [facility], [address], [place], [place] [address]   Sodium 02/12/2024 138  135 - 145 mmol/L Final   Potassium 02/12/2024 4.0  3.5 - 5.1 mmol/L Final   Chloride 02/12/2024 103  98 - 111 mmol/L Final   CO2 02/12/2024 24  22 - 32 mmol/L Final  Glucose, Bld 02/12/2024 142 (H)  70 - 99 mg/dL Final   Glucose reference range applies only to samples taken after fasting for at least 8 hours.   BUN 02/12/2024 10  8 - 23 mg/dL Final   Creatinine, Ser 02/12/2024 0.76  0.44 - 1.00 mg/dL Final   Calcium  02/12/2024 8.6 (L)  8.9 - 10.3 mg/dL Final   GFR, Estimated 02/12/2024 >60  >60 mL/min Final   Comment: (NOTE) Calculated using the CKD-EPI Creatinine Equation (2021)    Anion gap 02/12/2024 11  5 - 15 Final   Performed at [facility], [address] [place], [place] [address]   WBC 02/13/2024 6.4  4.0 - 10.5 K/uL Final   RBC 02/13/2024 3.57 (L)  3.87 - 5.11 MIL/uL Final   Hemoglobin 02/13/2024 9.5 (L)   12.0 - 15.0 g/dL Final   HCT 98/78/7973 29.8 (L)  36.0 - 46.0 % Final   MCV 02/13/2024 83.5  80.0 - 100.0 fL Final   MCH 02/13/2024 26.6  26.0 - 34.0 pg Final   MCHC 02/13/2024 31.9  30.0 - 36.0 g/dL Final   RDW 98/78/7973 16.9 (H)  11.5 - 15.5 % Final   Platelets 02/13/2024 286  150 - 400 K/uL Final   nRBC 02/13/2024 0.3 (H)  0.0 - 0.2 % Final   Performed at [facility], [address] [place], [place] [address]  Hospital Outpatient Visit on 01/31/2024  Component Date Value Ref Range Status   MRSA, PCR 01/31/2024 NEGATIVE  NEGATIVE Final   Staphylococcus aureus 01/31/2024 NEGATIVE  NEGATIVE Final   Comment: (NOTE) The Xpert SA Assay (FDA approved for NASAL specimens in patients 54 years of age and older), is one component of a comprehensive surveillance program. It is not intended to diagnose infection nor to guide or monitor treatment. Performed at [facility], [address], [place], [place] [address]    Sodium 01/31/2024 139  135 - 145 mmol/L Final   Potassium 01/31/2024 4.3  3.5 - 5.1 mmol/L Final   Chloride 01/31/2024 107  98 - 111 mmol/L Final   CO2 01/31/2024 23  22 - 32 mmol/L Final   Glucose, Bld 01/31/2024 96  70 - 99 mg/dL Final   Glucose reference range applies only to samples taken after fasting for at least 8 hours.   BUN 01/31/2024 11  8 - 23 mg/dL Final   Creatinine, Ser 01/31/2024 0.87  0.44 - 1.00 mg/dL Final   Calcium  01/31/2024 9.4  8.9 - 10.3 mg/dL Final   GFR, Estimated 01/31/2024 >60  >60 mL/min Final   Comment: (NOTE) Calculated using the CKD-EPI Creatinine Equation (2021)    Anion gap 01/31/2024 10  5 - 15 Final   Performed at [facility], [address], [place], [place] [address]   WBC 01/31/2024 2.5 (L)  4.0 - 10.5 K/uL Final   RBC 01/31/2024 3.83 (L)  3.87 - 5.11 MIL/uL Final   Hemoglobin 01/31/2024 10.6 (L)  12.0 - 15.0 g/dL Final   HCT 98/91/7973 33.3 (L)  36.0 - 46.0 % Final   MCV  01/31/2024 86.9  80.0 - 100.0 fL Final   MCH 01/31/2024 27.7  26.0 - 34.0 pg Final   MCHC 01/31/2024 31.8  30.0 - 36.0 g/dL Final   RDW 98/91/7973 17.7 (H)  11.5 - 15.5 % Final   Platelets 01/31/2024 213  150 - 400 K/uL Final   nRBC 01/31/2024 0.0  0.0 - 0.2 % Final  Performed at [facility], [address] [place], [place] [address]  Appointment on 01/07/2024  Component Date Value Ref Range Status   WBC Count 01/07/2024 8.9  4.0 - 10.5 K/uL Final   RBC 01/07/2024 4.20  3.87 - 5.11 MIL/uL Final   Hemoglobin 01/07/2024 11.2 (L)  12.0 - 15.0 g/dL Final   HCT 87/84/7974 34.6 (L)  36.0 - 46.0 % Final   MCV 01/07/2024 82.4  80.0 - 100.0 fL Final   MCH 01/07/2024 26.7  26.0 - 34.0 pg Final   MCHC 01/07/2024 32.4  30.0 - 36.0 g/dL Final   RDW 87/84/7974 16.1 (H)  11.5 - 15.5 % Final   Platelet Count 01/07/2024 368  150 - 400 K/uL Final   nRBC 01/07/2024 0.0  0.0 - 0.2 % Final   Neutrophils Relative % 01/07/2024 84  % Final   Neutro Abs 01/07/2024 7.5  1.7 - 7.7 K/uL Final   Lymphocytes Relative 01/07/2024 10  % Final   Lymphs Abs 01/07/2024 0.9  0.7 - 4.0 K/uL Final   Monocytes Relative 01/07/2024 6  % Final   Monocytes Absolute 01/07/2024 0.5  0.1 - 1.0 K/uL Final   Eosinophils Relative 01/07/2024 0  % Final   Eosinophils Absolute 01/07/2024 0.0  0.0 - 0.5 K/uL Final   Basophils Relative 01/07/2024 0  % Final   Basophils Absolute 01/07/2024 0.0  0.0 - 0.1 K/uL Final   Immature Granulocytes 01/07/2024 0  % Final   Abs Immature Granulocytes 01/07/2024 0.03  0.00 - 0.07 K/uL Final   Performed at [facility], [address], [place], [place] [address]   Vitamin B-12 01/07/2024 3,628 (H)  180 - 914 pg/mL Final   Performed at [facility], [address], [place], [place] [address]   Copper  01/07/2024 113  80 - 158 ug/dL Final   Comment: (NOTE) This test was developed and its performance characteristics determined by [facility].  It has not been cleared or approved by the Food and Drug Administration.                                Detection Limit = 5 Performed At: [facility] [address] [place], [place] [id number] Jennette Shorter MD Ey:[phone]      X-Rays:No results found.  EKG: Orders placed or performed in visit on 09/27/23   EKG 12-Lead     Hospital Course: Bridget Cortez is a 79 y.o. who was admitted to [facility]. They were brought to the operating room on 02/11/2024 and underwent Procedures: ARTHROPLASTY, KNEE, TOTAL.  Patient tolerated the procedure well and was later transferred to the recovery room and then to the orthopaedic floor for postoperative care. They were given PO and IV analgesics for pain control following their surgery. They were given 24 hours of postoperative antibiotics of  Anti-infectives (From admission, onward)    Start     Dose/Rate Route Frequency Ordered Stop   02/11/24 2000  ceFAZolin  (ANCEF ) IVPB 2g/100 mL premix        2 g 200 mL/hr over 30 Minutes Intravenous Every 6 hours 02/11/24 1842 02/12/24 0232   02/11/24 1145  ceFAZolin  (ANCEF ) IVPB 2g/100 mL premix        2 g 200 mL/hr over 30 Minutes Intravenous On call to O.R. 02/11/24 1135 02/11/24 1435      and started on DVT prophylaxis in the  form of Xarelto .   PT and OT were ordered for total joint protocol. Discharge planning consulted to help with postop disposition and equipment needs. Patient had an alright night on the evening of surgery. They started to get up OOB with therapy on 02/12/24. Continued to work with therapy into POD #2. Pt was seen during rounds on day two and was ready to go home pending progress with therapy and SNF bed palcment. Pt worked with therapy was meeting their goals for SNF placement.  Diet: Regular diet Activity: WBAT Follow-up: in 2 weeks Disposition: Skilled nursing facility Discharged Condition: good   Discharge Instructions     Call MD / Call 911    Complete by: As directed    If you experience chest pain or shortness of breath, CALL 911 and be transported to the hospital emergency room.  If you develope a fever above 101 F, pus (white drainage) or increased drainage or redness at the wound, or calf pain, call your surgeon's office.   Call MD / Call 911   Complete by: As directed    If you experience chest pain or shortness of breath, CALL 911 and be transported to the hospital emergency room.  If you develope a fever above 101 F, pus (white drainage) or increased drainage or redness at the wound, or calf pain, call your surgeon's office.   Change dressing   Complete by: As directed    You may remove the bulky bandage (ACE wrap and gauze) two days after surgery. You will have an adhesive waterproof bandage underneath. Leave this in place until your first follow-up appointment.   Change dressing   Complete by: As directed    You may remove the bulky bandage (ACE wrap and gauze) two days after surgery. You will have an adhesive waterproof bandage underneath. Leave this in place until your first follow-up appointment.   Constipation Prevention   Complete by: As directed    Drink plenty of fluids.  Prune juice may be helpful.  You may use a stool softener, such as Colace (over the counter) 100 mg twice a day.  Use MiraLax  (over the counter) for constipation as needed.   Constipation Prevention   Complete by: As directed    Drink plenty of fluids.  Prune juice may be helpful.  You may use a stool softener, such as Colace (over the counter) 100 mg twice a day.  Use MiraLax  (over the counter) for constipation as needed.   Diet - low sodium heart healthy   Complete by: As directed    Diet - low sodium heart healthy   Complete by: As directed    Do not put a pillow under the knee. Place it under the heel.   Complete by: As directed    Do not put a pillow under the knee. Place it under the heel.   Complete by: As directed    Driving restrictions    Complete by: As directed    No driving for two weeks   Driving restrictions   Complete by: As directed    No driving for two weeks   Post-operative opioid taper instructions:   Complete by: As directed    POST-OPERATIVE OPIOID TAPER INSTRUCTIONS: It is important to wean off of your opioid medication as soon as possible. If you do not need pain medication after your surgery it is ok to stop day one. Opioids include: Codeine, Hydrocodone (Norco, Vicodin), Oxycodone (Percocet, oxycontin ) and hydromorphone  amongst others.  Long term and  even short term use of opiods can cause: Increased pain response Dependence Constipation Depression Respiratory depression And more.  Withdrawal symptoms can include Flu like symptoms Nausea, vomiting And more Techniques to manage these symptoms Hydrate well Eat regular healthy meals Stay active Use relaxation techniques(deep breathing, meditating, yoga) Do Not substitute Alcohol  to help with tapering If you have been on opioids for less than two weeks and do not have pain than it is ok to stop all together.  Plan to wean off of opioids This plan should start within one week post op of your joint replacement. Maintain the same interval or time between taking each dose and first decrease the dose.  Cut the total daily intake of opioids by one tablet each day Next start to increase the time between doses. The last dose that should be eliminated is the evening dose.      Post-operative opioid taper instructions:   Complete by: As directed    POST-OPERATIVE OPIOID TAPER INSTRUCTIONS: It is important to wean off of your opioid medication as soon as possible. If you do not need pain medication after your surgery it is ok to stop day one. Opioids include: Codeine, Hydrocodone (Norco, Vicodin), Oxycodone (Percocet, oxycontin ) and hydromorphone  amongst others.  Long term and even short term use of opiods can cause: Increased pain  response Dependence Constipation Depression Respiratory depression And more.  Withdrawal symptoms can include Flu like symptoms Nausea, vomiting And more Techniques to manage these symptoms Hydrate well Eat regular healthy meals Stay active Use relaxation techniques(deep breathing, meditating, yoga) Do Not substitute Alcohol  to help with tapering If you have been on opioids for less than two weeks and do not have pain than it is ok to stop all together.  Plan to wean off of opioids This plan should start within one week post op of your joint replacement. Maintain the same interval or time between taking each dose and first decrease the dose.  Cut the total daily intake of opioids by one tablet each day Next start to increase the time between doses. The last dose that should be eliminated is the evening dose.      TED hose   Complete by: As directed    Use stockings (TED hose) for three weeks on both leg(s).  You may remove them at night for sleeping.   TED hose   Complete by: As directed    Use stockings (TED hose) for three weeks on both leg(s).  You may remove them at night for sleeping.   Weight bearing as tolerated   Complete by: As directed    Weight bearing as tolerated   Complete by: As directed       Allergies as of 02/13/2024       Reactions   Ephedrine  Shortness Of Breath   Cortisone Other (See Comments)   Severe muscle spasms with certain types of cortisone   Fluticasone  Propionate    Caused nose bleeds        Medication List     STOP taking these medications    predniSONE  50 MG tablet Commonly known as: DELTASONE        TAKE these medications    acetaminophen  500 MG tablet Commonly known as: TYLENOL  Take 1,000 mg by mouth every 6 (six) hours as needed for moderate pain (pain score 4-6) or headache.   albuterol  (5 MG/ML) 0.5% nebulizer solution Commonly known as: PROVENTIL  Take 0.5 mLs (2.5 mg total) by nebulization every 6 (six) hours  as needed  for wheezing or shortness of breath.   amLODipine  10 MG tablet Commonly known as: NORVASC  Take 5 mg by mouth at bedtime.   betamethasone  dipropionate 0.05 % cream Apply 1 application  topically 2 (two) times daily as needed (for psoriasis and keratosis pilaris - under the breasts or on the stomach).   CALTRATE 600+D PO Take 1 tablet by mouth in the morning.   cetirizine 10 MG tablet Commonly known as: ZYRTEC Take 10 mg by mouth in the morning.   cholecalciferol 25 MCG (1000 UNIT) tablet Commonly known as: VITAMIN D3 Take 1,000 Units by mouth daily.   esomeprazole 40 MG capsule Commonly known as: NEXIUM Take 40 mg by mouth 2 (two) times daily before a meal.   ferrous sulfate  325 (65 FE) MG tablet Take 325 mg by mouth 2 (two) times a week.   Fluticasone -Salmeterol 250-50 MCG/DOSE Aepb Commonly known as: ADVAIR  Inhale 1 puff into the lungs in the morning and at bedtime.   loperamide  2 MG capsule Commonly known as: IMODIUM  Take 1 capsule (2 mg total) by mouth 2 (two) times daily as needed for diarrhea or loose stools.   methocarbamol  500 MG tablet Commonly known as: ROBAXIN  Take 1 tablet (500 mg total) by mouth every 6 (six) hours as needed for muscle spasms.   multivitamin tablet Take 1 tablet by mouth See admin instructions. Solgar Formula VM-75 Multivitamin with Chelated Minerals tablets - Take 1 tablet by mouth with breakfast   nebivolol  5 MG tablet Commonly known as: BYSTOLIC  Take 1 tablet by mouth once daily   ondansetron  4 MG tablet Commonly known as: ZOFRAN  Take 1 tablet (4 mg total) by mouth every 6 (six) hours as needed for nausea.   oxyCODONE  5 MG immediate release tablet Commonly known as: Oxy IR/ROXICODONE  Take 1 tablet (5 mg total) by mouth every 4 (four) hours as needed for severe pain (pain score 7-10). What changed:  how much to take when to take this   polyethylene glycol 17 g packet Commonly known as: MIRALAX  / GLYCOLAX  Take 17 g by  mouth daily as needed for mild constipation.   potassium chloride  SA 20 MEQ tablet Commonly known as: KLOR-CON  M Take 20 mEq by mouth 3 (three) times daily.   rosuvastatin  5 MG tablet Commonly known as: CRESTOR  Take 5 mg by mouth at bedtime.   simethicone  125 MG chewable tablet Commonly known as: MYLICON Chew 125 mg by mouth every 6 (six) hours as needed for flatulence.   sodium chloride  0.65 % Soln nasal spray Commonly known as: OCEAN Place 1 spray into both nostrils as needed for congestion.   SSD 1 % cream Generic drug: silver  sulfADIAZINE Apply 1 Application topically 2 (two) times daily as needed (for chafing OR for psoriasis and keratosis pilaris - under the breasts or on the stomach).   Systane Hydration PF 0.4-0.3 % Soln Generic drug: Polyethyl Glyc-Propyl Glyc PF Place 1 drop into both eyes 3 (three) times daily as needed (for dryness or inflammation).   telmisartan 40 MG tablet Commonly known as: MICARDIS Take 40 mg by mouth in the morning.   torsemide  20 MG tablet Commonly known as: DEMADEX  Take 1 tablet (20 mg total) by mouth daily.   traMADol  50 MG tablet Commonly known as: ULTRAM  Take 1-2 tablets (50-100 mg total) by mouth every 6 (six) hours as needed for moderate pain (pain score 4-6). What changed:  how much to take when to take this reasons to take this   triamcinolone   cream 0.1 % Commonly known as: KENALOG Apply 1 application  topically 2 (two) times daily as needed (for psoriasis and keratosis pilaris - under the breasts or on the stomach).   VITAMIN C  WITH ROSE HIPS PO Take 1 tablet by mouth daily.   Xarelto  20 MG Tabs tablet Generic drug: rivaroxaban  TAKE 1 TABLET BY MOUTH ONCE DAILY WITH SUPPER               Discharge Care Instructions  (From admission, onward)           Start     Ordered   02/13/24 0000  Weight bearing as tolerated        02/13/24 0920   02/13/24 0000  Change dressing       Comments: You may remove the  bulky bandage (ACE wrap and gauze) two days after surgery. You will have an adhesive waterproof bandage underneath. Leave this in place until your first follow-up appointment.   02/13/24 0920   02/12/24 0000  Weight bearing as tolerated        02/12/24 0807   02/12/24 0000  Change dressing       Comments: You may remove the bulky bandage (ACE wrap and gauze) two days after surgery. You will have an adhesive waterproof bandage underneath. Leave this in place until your first follow-up appointment.   02/12/24 9192            Follow-up Information     Melodi Lerner, MD. Go on 02/26/2024.   Specialty: Orthopedic Surgery Why: You are scheduled for first postop appointment on Tuesday February 3 at 2:45pm. Contact information: [address] [place] [address] [phone]                 Signed: Waddell Sor, [name] Orthopedic Surgery 02/13/2024, 9:20 AM

## 2024-02-13 NOTE — Progress Notes (Signed)
 Physical Therapy Treatment Patient Details Name: Bridget Cortez MRN: [id number] DOB: 12-13-1945 Today's Date: 02/13/2024   History of Present Illness Pt s/p L TKR on 02/11/24 and with hx of R TKR, PAF and Chiari malformation    PT Comments  Pt not able to progress to standing today.  She expressed pain (was premedicated) and fearful of falling. Pt demonstrating weakness throughout bil LE with exercises and needing increased time and cues for all transfers.  She denied any lightheadedness or dizziness in sitting but continued to express fear of falling and feeling dead tired.  When returned to bed , did note pt diaphoretic on nose, BP at that time was 124/77 with HR 110 bpm.  Will plan to check BP in sitting next visit.     If plan is discharge home, recommend the following: Two people to help with walking and/or transfers;Assistance with cooking/housework;Help with stairs or ramp for entrance;Assist for transportation;Two people to help with bathing/dressing/bathroom   Can travel by private vehicle     No  Equipment Recommendations  None recommended by PT    Recommendations for Other Services       Precautions / Restrictions Precautions Precautions: Knee;Fall Restrictions LLE Weight Bearing Per Provider Order: Weight bearing as tolerated     Mobility  Bed Mobility Overal bed mobility: Needs Assistance Bed Mobility: Supine to Sit     Supine to sit: Max assist Sit to supine: Max assist, +2 for physical assistance   General bed mobility comments: Increased time and significant cues to get to EOB requiring assist for bil LE , trunk , and scooting forward.  Pt leaning onto R elbow with difficulty coming upright expressing fear of falling.  REquiring significantly increased time and reassurance that she is stable on bed and just needs to push up to sit.  Assist for trunk and legs back to bed    Transfers Overall transfer level: Needs assistance Equipment used: Rolling  walker (2 wheels) Transfers: Sit to/from Stand Sit to Stand: Max assist, +2 physical assistance           General transfer comment: Attempted STS from elevated bed x 3 with use of belt, bed pad, momentum, and cues.  Pt with little effort when going to stand, expressing pain and fear of falling    Ambulation/Gait               General Gait Details: not able today   Stairs             Wheelchair Mobility     Tilt Bed    Modified Rankin (Stroke Patients Only)       Balance Overall balance assessment: Needs assistance Sitting-balance support: Feet supported, Single extremity supported Sitting balance-Leahy Scale: Poor Sitting balance - Comments: Pt expressing fear of falling at EOB.  Pt reassursed that she is sitting EOB and safe.  She denies dizziness or lightheadedness.   Increased time to progress pt to sitting upright and she still prfers at least single UE support                                    Communication    Cognition Arousal: Alert Behavior During Therapy: Anxious   PT - Cognitive impairments: No apparent impairments                       PT - Cognition Comments: Fearful  of pain and falling; Some confusion/forgetfullness in regards to events during session and difficulty with motor planning/sequencing for transfers but then also recalls names throughout session.  Seems distracted by pain and fear of falling, additionally report lethargy/fatigue from meds Following commands: Impaired Following commands impaired: Follows one step commands with increased time    Cueing Cueing Techniques: Verbal cues, Tactile cues  Exercises Total Joint Exercises Ankle Circles/Pumps: AROM, Both, 20 reps, Supine Quad Sets: AROM, Both, Supine, 20 reps, Limitations Quad Sets Limitations: weak contraction on L Heel Slides: AAROM, Both, 20 reps, Supine, Limitations Heel Slides Limitations: limited motion on L Long Arc Quad: AROM, Right, 10  reps, Seated Knee Flexion: AAROM, Left, 5 reps, Seated Goniometric ROM: L knee 5 to 60 degrees Other Exercises Other Exercises: Pt reports even weak on R side requiring AAROM at times and fatigued easily with LAQ.    General Comments General comments (skin integrity, edema, etc.): Pt denied dizziness or lightheadedness at EOB, but did c/o being very weak and tired, stating like I'm dead, after return to bed and noted diaphoretic on her nose.  BP in bed was 124/77 with HR 110 bpm, still denies any lightheadedness      Pertinent Vitals/Pain Pain Assessment Pain Assessment: Faces Faces Pain Scale: Hurts even more Pain Location: L knee with movement Pain Descriptors / Indicators: Grimacing, Guarding Pain Intervention(s): Limited activity within patient's tolerance, Monitored during session, Premedicated before session, Repositioned    Home Living                          Prior Function            PT Goals (current goals can now be found in the care plan section) Progress towards PT goals: Not progressing toward goals - comment (limited by pain and weakness today)    Frequency    7X/week      PT Plan      Co-evaluation              AM-PAC PT 6 Clicks Mobility   Outcome Measure  Help needed turning from your back to your side while in a flat bed without using bedrails?: A Lot Help needed moving from lying on your back to sitting on the side of a flat bed without using bedrails?: Total Help needed moving to and from a bed to a chair (including a wheelchair)?: Total Help needed standing up from a chair using your arms (e.g., wheelchair or bedside chair)?: Total Help needed to walk in hospital room?: Total Help needed climbing 3-5 steps with a railing? : Total 6 Click Score: 7    End of Session Equipment Utilized During Treatment: Gait belt Activity Tolerance: Patient limited by pain;Patient limited by fatigue Patient left: in bed;with call bell/phone  within reach;with bed alarm set;with SCD's reapplied Nurse Communication: Mobility status PT Visit Diagnosis: Difficulty in walking, not elsewhere classified (R26.2);Muscle weakness (generalized) (M62.81)     Time: 8888-8850 PT Time Calculation (min) (ACUTE ONLY): 38 min  Charges:    $Therapeutic Exercise: 8-22 mins $Therapeutic Activity: 23-37 mins PT General Charges $$ ACUTE PT VISIT: 1 Visit                     Benjiman, PT Acute Rehab [facility] [phone]    Benjiman VEAR Mulberry 02/13/2024, 12:07 PM

## 2024-02-13 NOTE — Progress Notes (Signed)
 Physical Therapy Treatment Patient Details Name: Sacha Topor MRN: [id number] DOB: Nov 12, 1945 Today's Date: 02/13/2024   History of Present Illness Pt s/p L TKR on 02/11/24 and with hx of R TKR, PAF and Chiari malformation    PT Comments  Pt similar to morning session.  Not able to progress to standing due to weakness, pain, and anxious.  Some concern over orthostatic hypotension this morning; however, when monitored this evening was negative.  Pt requiring max x 2 for all transfers with increased cues for sequencing -appears distracted by pain and concern of SNF placement.  Pt frequently asking when Alfonse from [facility] would follow up - reassured pt that [facility] is working on placement and if she told her she would be back this afternoon she will check in.  Continue to recommend SNF at d/c.     If plan is discharge home, recommend the following: Two people to help with walking and/or transfers;Assistance with cooking/housework;Help with stairs or ramp for entrance;Assist for transportation;Two people to help with bathing/dressing/bathroom   Can travel by private vehicle     No  Equipment Recommendations  None recommended by PT    Recommendations for Other Services       Precautions / Restrictions Precautions Precautions: Knee;Fall Restrictions LLE Weight Bearing Per Provider Order: Weight bearing as tolerated     Mobility  Bed Mobility Overal bed mobility: Needs Assistance Bed Mobility: Supine to Sit     Supine to sit: Max assist, HOB elevated, Used rails Sit to supine: Max assist, +2 for physical assistance   General bed mobility comments: Increased time and significant cues to get to EOB requiring assist for bil LE , trunk , and scooting forward.  Pt leaning onto R elbow with difficulty coming upright expressing fear of falling.  REquiring significantly increased time and reassurance that she is stable on bed and just needs to push up to sit.  Assist for trunk and legs back  to bed    Transfers Overall transfer level: Needs assistance Equipment used: Rolling walker (2 wheels) Transfers: Sit to/from Stand Sit to Stand: Max assist, +2 physical assistance           General transfer comment: Pt not leaning forward well to stand, worked on just rocking forward and reaching forward prior to standing. Attempted STS from elevated bed x 3 with use of belt, bed pad, momentum, and cues.  Pt with little effort when going to stand, expressing pain and fear of falling    Ambulation/Gait               General Gait Details: not able today   Stairs             Wheelchair Mobility     Tilt Bed    Modified Rankin (Stroke Patients Only)       Balance Overall balance assessment: Needs assistance Sitting-balance support: Feet supported, Single extremity supported Sitting balance-Leahy Scale: Poor Sitting balance - Comments: Pt expressing fear of falling at EOB.  Pt reassursed that she is sitting EOB and safe.  She denies dizziness or lightheadedness.   Increased time to progress pt to sitting upright and she still prfers at least single UE support                                    Communication    Cognition Arousal: Alert Behavior During Therapy: Anxious   PT -  Cognitive impairments: No apparent impairments                       PT - Cognition Comments: Fearful of pain and falling; Some confusion/forgetfullness in regards to events during session and difficulty with motor planning/sequencing for transfers but then also recalls names throughout session.  Seems distracted by pain and fear of falling and SNF placement concerns, additionally report lethargy/fatigue from meds Following commands: Impaired Following commands impaired: Follows one step commands with increased time    Cueing Cueing Techniques: Verbal cues, Tactile cues  Exercises Total Joint Exercises Ankle Circles/Pumps: AROM, Both, 20 reps, Supine Quad  Sets: AROM, Both, Supine, 20 reps, Limitations Quad Sets Limitations: weak contraction on L Heel Slides: AAROM, Both, 20 reps, Supine, Limitations Heel Slides Limitations: limited motion on L Long Arc Quad: AROM, Right, 10 reps, Seated Knee Flexion: AAROM, Left, 5 reps, Seated Goniometric ROM: L knee 5 to 60 degrees Other Exercises Other Exercises: Pt reports even weak on R side requiring AAROM at times and fatigued easily with LAQ.    General Comments General comments (skin integrity, edema, etc.): HR up to 123 bpm wtih activity but recovery to 90's quickly.  O2 sats stable.  BP were stable with activitity 130/76 supine , 126/74 sitting, 126/73 after attempt to stand.      Pertinent Vitals/Pain Pain Assessment Pain Assessment: Faces Faces Pain Scale: Hurts whole lot Pain Location: L knee with movement Pain Descriptors / Indicators: Grimacing, Guarding Pain Intervention(s): Limited activity within patient's tolerance, Monitored during session, Premedicated before session, Repositioned    Home Living                          Prior Function            PT Goals (current goals can now be found in the care plan section) Progress towards PT goals: Not progressing toward goals - comment (limited by pain and weakness today)    Frequency    7X/week      PT Plan      Co-evaluation              AM-PAC PT 6 Clicks Mobility   Outcome Measure  Help needed turning from your back to your side while in a flat bed without using bedrails?: A Lot Help needed moving from lying on your back to sitting on the side of a flat bed without using bedrails?: Total Help needed moving to and from a bed to a chair (including a wheelchair)?: Total Help needed standing up from a chair using your arms (e.g., wheelchair or bedside chair)?: Total Help needed to walk in hospital room?: Total Help needed climbing 3-5 steps with a railing? : Total 6 Click Score: 7    End of Session  Equipment Utilized During Treatment: Gait belt Activity Tolerance: Patient limited by pain;Patient limited by fatigue;Other (comment) (anxious) Patient left: in bed;with call bell/phone within reach;with bed alarm set;with SCD's reapplied (ice packs to knee) Nurse Communication: Mobility status PT Visit Diagnosis: Difficulty in walking, not elsewhere classified (R26.2);Muscle weakness (generalized) (M62.81)     Time: 8577-8541 PT Time Calculation (min) (ACUTE ONLY): 36 min  Charges:    $Therapeutic Exercise: 8-22 mins $Therapeutic Activity: 8-22 mins PT General Charges $$ ACUTE PT VISIT: 1 Visit                     Madellyn Denio, PT Acute Rehab Services  [facility] [phone]    Benjiman VEAR Mulberry 02/13/2024, 3:40 PM

## 2024-02-13 NOTE — Progress Notes (Signed)
" ° °  Subjective: 2 Days Post-Op Procedures (LRB): ARTHROPLASTY, KNEE, TOTAL (Left) Patient reports pain as moderate.   Patient seen in rounds for Dr. Melodi. Patient is well, but has had some minor complaints of fatigue and pain in the Left knee, requiring pain medications Plan is to go Skilled nursing facility after hospital stay.  Objective: Vital signs in last 24 hours: Temp:  [97.8 F (36.6 C)-98.9 F (37.2 C)] 98.9 F (37.2 C) (01/21 0655) Pulse Rate:  [65-108] 108 (01/21 0655) Resp:  [16-18] 18 (01/21 0655) BP: (114-143)/(78-100) 136/100 (01/21 0655) SpO2:  [93 %-100 %] 99 % (01/21 0655)  Intake/Output from previous day:  Intake/Output Summary (Last 24 hours) at 02/13/2024 0912 Last data filed at 02/13/2024 0855 Gross per 24 hour  Intake 1200 ml  Output 1650 ml  Net -450 ml    Intake/Output this shift: Total I/O In: -  Out: 300 [Urine:300]  Labs: Recent Labs    02/12/24 0344 02/13/24 0354  HGB 9.9* 9.5*   Recent Labs    02/12/24 0344 02/13/24 0354  WBC 6.0 6.4  RBC 3.72* 3.57*  HCT 31.6* 29.8*  PLT 332 286   Recent Labs    02/12/24 0344  NA 138  K 4.0  CL 103  CO2 24  BUN 10  CREATININE 0.76  GLUCOSE 142*  CALCIUM  8.6*   No results for input(s): LABPT, INR in the last 72 hours.  Exam: General - Patient is Alert and Oriented Extremity - Neurologically intact Neurovascular intact Sensation intact distally Dorsiflexion/Plantar flexion intact Dressing/Incision - clean, dry Motor Function - intact, moving foot and toes well on exam.   Past Medical History:  Diagnosis Date   Abnormal Pap smear 2006   vaginal medicine according to patient   Allergy    Arthritis    Asthma    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 eye    cataract removed.   Chiari malformation    Complication of anesthesia    woke up during left breast surgery age 25    Diverticulosis    Dysrhythmia    SVT   GERD (gastroesophageal reflux disease)    H/O osteopenia    08/2006   H/O varicella    Heart murmur    Hypertension    Monilial vaginitis 07/2007   Multiple allergies    Ovarian cyst    Personal history of colonic adenomas 03/13/2012   Personal history of radiation therapy 1990   Pneumonia 07/2014   VIN III (vulvar intraepithelial neoplasia III) 03/2009   Yeast infection     Assessment/Plan: 2 Days Post-Op Procedures (LRB): ARTHROPLASTY, KNEE, TOTAL (Left) Principal Problem:   OA (osteoarthritis) of knee Active Problems:   Primary osteoarthritis of left knee  Estimated body mass index is 36.02 kg/m as calculated from the following:   Height as of this encounter: 5' 7 (1.702 m).   Weight as of this encounter: 104.3 kg. Advance diet Up with therapy D/C IV fluids  DVT Prophylaxis - Xarelto  Weight-bearing as tolerated  Good to DC today, awaiting bed placement at SNF.   Waddell Sor, [name] Orthopedic Surgery [phone] 02/13/2024, 9:12 AM  "

## 2024-02-13 NOTE — TOC Progression Note (Addendum)
 Transition of Care [facility]) - Progression Note    Patient Details  Name: Bridget Cortez MRN: [id number] Date of Birth: [birth year]  [facility] CM/SW Contact  [name], RN Phone Number: 02/13/2024, 12:34 PM  Clinical Narrative:   Met with patient bedside to review short term rehab/SNF bed offers [facility], [facility], [facility]), patient would like to discuss with her spouse at during his visit today. NCM will follow up later today.   -3:00pm Call from [place] with [facility], reports patient now has a SNF bed offer, reports no female bed available today, possibly tomorrow or Friday. Ortho PA notified, hopefully SNF bed available tomorrow.   -4:00pm Call received from [facility], confirmed private room available for admit tomorrow, team notified.     Expected Discharge Plan: Skilled Nursing Facility Barriers to Discharge: Continued Medical Work up               Expected Discharge Plan and Services       Living arrangements for the past 2 months: Single Family Home Expected Discharge Date: 02/13/24               DME Arranged: N/A                     Social Drivers of Health (SDOH) Interventions SDOH Screenings   Food Insecurity: No Food Insecurity (02/11/2024)  Housing: Low Risk (02/11/2024)  Recent Concern: Housing - Medium Risk (01/21/2024)   Received from [facility]  Transportation Needs: No Transportation Needs (02/11/2024)  Utilities: Not At Risk (02/11/2024)  Depression (PHQ2-9): Low Risk (12/07/2023)  Tobacco Use: Medium Risk (02/11/2024)    Readmission Risk Interventions     No data to display

## 2024-02-14 DIAGNOSIS — M1712 Unilateral primary osteoarthritis, left knee: Secondary | ICD-10-CM | POA: Diagnosis not present

## 2024-02-14 NOTE — Progress Notes (Signed)
 Report called to RN from [place]. All questions answered. Awaiting PTAR

## 2024-02-14 NOTE — Progress Notes (Signed)
 "  Subjective: 3 Days Post-Op Procedures (LRB): ARTHROPLASTY, KNEE, TOTAL (Left) Patient reports pain as mild.   Patient seen in rounds by Dr. Melodi. Patient is well, and has had no acute complaints or problems No issues overnight. Denies chest pain, SOB, or calf pain. We will continue therapy today.   Objective: Vital signs in last 24 hours: Temp:  [98 F (36.7 C)-100.9 F (38.3 C)] 99.8 F (37.7 C) (01/22 9386) Pulse Rate:  [81-115] 98 (01/22 0613) Resp:  [16-18] 18 (01/22 0613) BP: (107-134)/(73-81) 107/81 (01/22 0613) SpO2:  [97 %-100 %] 100 % (01/22 9386)  Intake/Output from previous day:  Intake/Output Summary (Last 24 hours) at 02/14/2024 0805 Last data filed at 02/13/2024 2300 Gross per 24 hour  Intake --  Output 1950 ml  Net -1950 ml     Intake/Output this shift: No intake/output data recorded.  Labs: Recent Labs    02/12/24 0344 02/13/24 0354  HGB 9.9* 9.5*   Recent Labs    02/12/24 0344 02/13/24 0354  WBC 6.0 6.4  RBC 3.72* 3.57*  HCT 31.6* 29.8*  PLT 332 286   Recent Labs    02/12/24 0344  NA 138  K 4.0  CL 103  CO2 24  BUN 10  CREATININE 0.76  GLUCOSE 142*  CALCIUM  8.6*   No results for input(s): LABPT, INR in the last 72 hours.  Exam: General - Patient is Alert and Oriented Extremity - Neurologically intact Neurovascular intact Sensation intact distally Dorsiflexion/Plantar flexion intact Dressing - dressing C/D/I Motor Function - intact, moving foot and toes well on exam.   Past Medical History:  Diagnosis Date   Abnormal Pap smear 2006   vaginal medicine according to patient   Allergy    Arthritis    Asthma    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 eye    cataract removed.   Chiari malformation    Complication of anesthesia    woke up during left breast surgery age 70   Diverticulosis    Dysrhythmia    SVT    GERD (gastroesophageal reflux disease)    H/O osteopenia    08/2006   H/O varicella    Heart murmur    Hypertension    Monilial vaginitis 07/2007   Multiple allergies    Ovarian cyst    Personal history of colonic adenomas 03/13/2012   Personal history of radiation therapy 1990   Pneumonia 07/2014   VIN III (vulvar intraepithelial neoplasia III) 03/2009   Yeast infection     Assessment/Plan: 3 Days Post-Op Procedures (LRB): ARTHROPLASTY, KNEE, TOTAL (Left) Principal Problem:   OA (osteoarthritis) of knee Active Problems:   Primary osteoarthritis of left knee  Estimated body mass index is 36.02 kg/m as calculated from the following:   Height as of this encounter: 5' 7 (1.702 m).   Weight as of this encounter: 104.3 kg. Advance diet Up with therapy D/C IV fluids   Patient's anticipated LOS is less than 2 midnights, meeting these requirements: - Lives within 1 hour of care - Has a competent adult at home to recover with post-op recover - NO history of  - Chronic pain requiring opiods  - Diabetes  - Heart failure  - Heart attack  - Stroke  - DVT/VTE  - Renal failure  - Anemia  - Advanced Liver disease   She is noted  to have a small fever over night, she is asymptomatic. More than likely due to being sedentary after surgery. Plan for DC today awaiting SNF bed.   DVT Prophylaxis - Xarelto  Weight bearing as tolerated. Continue therapy.  Plan is to go Skilled nursing facility after hospital stay. Plan for discharge later today if progresses with therapy and meeting goals. Scheduled for OPPT at SNF then [facility]. Follow-up in the office in 2 weeks.  The PDMP database was reviewed today prior to any opioid medications being prescribed to this patient.  Waddell Sor, [name] Orthopedic Surgery [phone] 02/14/2024, 8:05 AM  "

## 2024-02-14 NOTE — TOC Transition Note (Signed)
 Transition of Care [facility]) - Discharge Note   Patient Details  Name: Bridget Cortez MRN: [id number] Date of Birth: 07-Oct-1945  [facility]) CM/SW Contact:  [name], RN Phone Number: 02/14/2024, 3:00 PM   Clinical Narrative:   DC to SNF-[facility], [place], admit coord, confirmed bed available for transfer today. Spouse at bedside. No further CM needs identified at this time.     Final next level of care: Skilled Nursing Facility Barriers to Discharge: Barriers Resolved   Patient Goals and CMS Choice Patient states their goals for this hospitalization and ongoing recovery are:: return home   Choice offered to / list presented to : Patient [place] ownership interest in [facility].provided to:: Patient    Discharge Placement              Patient chooses bed at: [facility] Patient to be transferred to facility by: PTAR Name of family member notified: spouse at bedside Patient and family notified of of transfer: 02/14/24  Discharge Plan and Services Additional resources added to the After Visit Summary for                  DME Arranged: N/A                    Social Drivers of Health (SDOH) Interventions SDOH Screenings   Food Insecurity: No Food Insecurity (02/11/2024)  Housing: Low Risk (02/11/2024)  Recent Concern: Housing - Medium Risk (01/21/2024)   Received from [facility]  Transportation Needs: No Transportation Needs (02/11/2024)  Utilities: Not At Risk (02/11/2024)  Depression (PHQ2-9): Low Risk (12/07/2023)  Tobacco Use: Medium Risk (02/11/2024)     Readmission Risk Interventions     No data to display

## 2024-02-14 NOTE — Plan of Care (Signed)
" °  Problem: Education: Goal: Knowledge of General Education information will improve Description: Including pain rating scale, medication(s)/side effects and non-pharmacologic comfort measures Outcome: Progressing   Problem: Health Behavior/Discharge Planning: Goal: Ability to manage health-related needs will improve Outcome: Progressing   Problem: Clinical Measurements: Goal: Ability to maintain clinical measurements within normal limits will improve Outcome: Progressing Goal: Will remain free from infection Outcome: Progressing Goal: Diagnostic test results will improve Outcome: Progressing Goal: Respiratory complications will improve Outcome: Progressing Goal: Cardiovascular complication will be avoided Outcome: Progressing   Problem: Activity: Goal: Risk for activity intolerance will decrease Outcome: Progressing   Problem: Coping: Goal: Level of anxiety will decrease Outcome: Progressing   Problem: Elimination: Goal: Will not experience complications related to bowel motility Outcome: Progressing   Problem: Pain Managment: Goal: General experience of comfort will improve and/or be controlled Outcome: Progressing   Problem: Safety: Goal: Ability to remain free from injury will improve Outcome: Progressing   Problem: Skin Integrity: Goal: Risk for impaired skin integrity will decrease Outcome: Progressing   Problem: Education: Goal: Knowledge of the prescribed therapeutic regimen will improve Outcome: Progressing   Problem: Bowel/Gastric: Goal: Gastrointestinal status for postoperative course will improve Outcome: Progressing   Problem: Cardiac: Goal: Ability to maintain an adequate cardiac output Outcome: Progressing   Problem: Clinical Measurements: Goal: Ability to maintain clinical measurements within normal limits Outcome: Progressing Goal: Postoperative complications will be avoided or minimized Outcome: Progressing   Problem: Respiratory: Goal:  Respiratory status will improve Outcome: Progressing   Problem: Skin Integrity: Goal: Demonstrates signs of wound healing without infection Outcome: Progressing   Problem: Urinary Elimination: Goal: Will remain free from infection Outcome: Progressing   Problem: Education: Goal: Knowledge of the prescribed therapeutic regimen will improve Outcome: Progressing Goal: Individualized Educational Video(s) Outcome: Progressing   Problem: Activity: Goal: Ability to avoid complications of mobility impairment will improve Outcome: Progressing Goal: Range of joint motion will improve Outcome: Progressing   Problem: Clinical Measurements: Goal: Postoperative complications will be avoided or minimized Outcome: Progressing   Problem: Pain Management: Goal: Pain level will decrease with appropriate interventions Outcome: Progressing   Problem: Skin Integrity: Goal: Will show signs of wound healing Outcome: Progressing   "

## 2024-02-14 NOTE — Progress Notes (Signed)
" °   02/13/24 2300  Vitals  Temp (!) 100.9 F (38.3 C)  Temp Source Oral  BP 134/73  MAP (mmHg) 86  BP Location Right Arm  BP Method Automatic  Patient Position (if appropriate) Lying  Pulse Rate (!) 115  Pulse Rate Source Monitor  Resp 16  MEWS COLOR  MEWS Score Color Yellow  Oxygen  Therapy  SpO2 98 %  O2 Device Room Air  MEWS Score  MEWS Temp 1  MEWS Systolic 0  MEWS Pulse 2  MEWS RR 0  MEWS LOC 0  MEWS Score 3   Pt covered in multiple blankets, 3 removed, ice packs placed to axillary. Pt noted anxious over her husband not being there at this time and placement at SNF. Spoke with her at length to calm her, VS will be rechecked. "

## 2024-02-16 ENCOUNTER — Other Ambulatory Visit: Payer: Self-pay

## 2024-02-16 ENCOUNTER — Observation Stay (HOSPITAL_COMMUNITY)
Admission: EM | Admit: 2024-02-16 | Discharge: 2024-02-22 | Disposition: A | Discharge status: Skilled Nursing Facility | Attending: Hospitalist | Admitting: Hospitalist

## 2024-02-16 DIAGNOSIS — Z87891 Personal history of nicotine dependence: Secondary | ICD-10-CM | POA: Insufficient documentation

## 2024-02-16 DIAGNOSIS — Z853 Personal history of malignant neoplasm of breast: Secondary | ICD-10-CM | POA: Diagnosis not present

## 2024-02-16 DIAGNOSIS — I10 Essential (primary) hypertension: Secondary | ICD-10-CM | POA: Diagnosis present

## 2024-02-16 DIAGNOSIS — M25562 Pain in left knee: Principal | ICD-10-CM

## 2024-02-16 DIAGNOSIS — I5032 Chronic diastolic (congestive) heart failure: Secondary | ICD-10-CM | POA: Insufficient documentation

## 2024-02-16 DIAGNOSIS — I480 Paroxysmal atrial fibrillation: Secondary | ICD-10-CM | POA: Diagnosis present

## 2024-02-16 DIAGNOSIS — I471 Supraventricular tachycardia, unspecified: Secondary | ICD-10-CM | POA: Diagnosis present

## 2024-02-16 DIAGNOSIS — K5900 Constipation, unspecified: Secondary | ICD-10-CM | POA: Diagnosis not present

## 2024-02-16 DIAGNOSIS — D62 Acute posthemorrhagic anemia: Principal | ICD-10-CM | POA: Insufficient documentation

## 2024-02-16 DIAGNOSIS — Z79899 Other long term (current) drug therapy: Secondary | ICD-10-CM | POA: Diagnosis not present

## 2024-02-16 DIAGNOSIS — J45909 Unspecified asthma, uncomplicated: Secondary | ICD-10-CM | POA: Insufficient documentation

## 2024-02-16 DIAGNOSIS — D649 Anemia, unspecified: Secondary | ICD-10-CM | POA: Diagnosis not present

## 2024-02-16 DIAGNOSIS — I5030 Unspecified diastolic (congestive) heart failure: Secondary | ICD-10-CM | POA: Diagnosis present

## 2024-02-16 DIAGNOSIS — Z96653 Presence of artificial knee joint, bilateral: Secondary | ICD-10-CM | POA: Insufficient documentation

## 2024-02-16 DIAGNOSIS — R109 Unspecified abdominal pain: Secondary | ICD-10-CM | POA: Diagnosis not present

## 2024-02-16 DIAGNOSIS — Z7901 Long term (current) use of anticoagulants: Secondary | ICD-10-CM | POA: Insufficient documentation

## 2024-02-16 DIAGNOSIS — I110 Hypertensive heart disease with heart failure: Secondary | ICD-10-CM | POA: Insufficient documentation

## 2024-02-16 DIAGNOSIS — R509 Fever, unspecified: Secondary | ICD-10-CM | POA: Insufficient documentation

## 2024-02-16 DIAGNOSIS — M25569 Pain in unspecified knee: Secondary | ICD-10-CM | POA: Diagnosis present

## 2024-02-16 LAB — COMPREHENSIVE METABOLIC PANEL WITH GFR
ALT: 9 U/L (ref 0–44)
AST: 19 U/L (ref 15–41)
Albumin: 3.2 g/dL — ABNORMAL LOW (ref 3.5–5.0)
Alkaline Phosphatase: 63 U/L (ref 38–126)
Anion gap: 9 (ref 5–15)
BUN: 16 mg/dL (ref 8–23)
CO2: 27 mmol/L (ref 22–32)
Calcium: 9 mg/dL (ref 8.9–10.3)
Chloride: 98 mmol/L (ref 98–111)
Creatinine, Ser: 0.7 mg/dL (ref 0.44–1.00)
GFR, Estimated: >60 mL/min
Glucose, Bld: 96 mg/dL (ref 70–99)
Potassium: 4.2 mmol/L (ref 3.5–5.1)
Sodium: 134 mmol/L — ABNORMAL LOW (ref 135–145)
Total Bilirubin: 0.8 mg/dL (ref 0.0–1.2)
Total Protein: 6.5 g/dL (ref 6.5–8.1)

## 2024-02-16 LAB — I-STAT CG4 LACTIC ACID, ED: Lactic Acid, Venous: 1 mmol/L (ref 0.5–1.9)

## 2024-02-16 LAB — CBC WITH DIFFERENTIAL/PLATELET
Abs Immature Granulocytes: 0.08 10*3/uL — ABNORMAL HIGH (ref 0.00–0.07)
Basophils Absolute: 0 10*3/uL (ref 0.0–0.1)
Basophils Relative: 0 %
Eosinophils Absolute: 0.1 10*3/uL (ref 0.0–0.5)
Eosinophils Relative: 1 %
HCT: 25.2 % — ABNORMAL LOW (ref 36.0–46.0)
Hemoglobin: 8.1 g/dL — ABNORMAL LOW (ref 12.0–15.0)
Immature Granulocytes: 1 %
Lymphocytes Relative: 9 %
Lymphs Abs: 1 10*3/uL (ref 0.7–4.0)
MCH: 26.7 pg (ref 26.0–34.0)
MCHC: 32.1 g/dL (ref 30.0–36.0)
MCV: 83.2 fL (ref 80.0–100.0)
Monocytes Absolute: 1.3 10*3/uL — ABNORMAL HIGH (ref 0.1–1.0)
Monocytes Relative: 12 %
Neutro Abs: 8.2 10*3/uL — ABNORMAL HIGH (ref 1.7–7.7)
Neutrophils Relative %: 77 %
Platelets: 284 10*3/uL (ref 150–400)
RBC: 3.03 MIL/uL — ABNORMAL LOW (ref 3.87–5.11)
RDW: 16.8 % — ABNORMAL HIGH (ref 11.5–15.5)
WBC: 10.5 10*3/uL (ref 4.0–10.5)
nRBC: 0 % (ref 0.0–0.2)

## 2024-02-16 LAB — APTT: aPTT: 46 s — ABNORMAL HIGH (ref 24–36)

## 2024-02-16 LAB — TYPE AND SCREEN
ABO/RH(D): O POS
Antibody Screen: NEGATIVE

## 2024-02-16 LAB — HEMOGLOBIN AND HEMATOCRIT, BLOOD
HCT: 25.4 % — ABNORMAL LOW (ref 36.0–46.0)
Hemoglobin: 8.2 g/dL — ABNORMAL LOW (ref 12.0–15.0)

## 2024-02-16 LAB — FIBRINOGEN: Fibrinogen: >800 mg/dL — ABNORMAL HIGH (ref 210–475)

## 2024-02-16 LAB — PROTIME-INR
INR: 1.4 — ABNORMAL HIGH (ref 0.8–1.2)
Prothrombin Time: 18.2 s — ABNORMAL HIGH (ref 11.4–15.2)

## 2024-02-16 MED ORDER — ACETAMINOPHEN 650 MG RE SUPP: 650.0000 mg | Freq: Four times a day (QID) | RECTAL | Status: DC | PRN

## 2024-02-16 MED ORDER — SMOG ENEMA: 400.0000 mL | RECTAL | Status: DC | PRN

## 2024-02-16 MED ORDER — NEBIVOLOL HCL 5 MG PO TABS
5.0000 mg | ORAL_TABLET | Freq: Every day | ORAL | Status: DC
  Administered 2024-02-16 – 2024-02-22 (×7): 5 mg via ORAL
  Filled 2024-02-16 (×7): qty 1

## 2024-02-16 MED ORDER — ONDANSETRON HCL 4 MG/2ML IJ SOLN: 4.0000 mg | Freq: Four times a day (QID) | INTRAMUSCULAR | Status: DC | PRN

## 2024-02-16 MED ORDER — MORPHINE SULFATE (PF) 4 MG/ML IV SOLN
4.0000 mg | INTRAVENOUS | Status: AC | PRN
  Administered 2024-02-16 (×2): 4 mg via INTRAVENOUS
  Filled 2024-02-16 (×2): qty 1

## 2024-02-16 MED ORDER — ACETAMINOPHEN 325 MG PO TABS
650.0000 mg | ORAL_TABLET | Freq: Four times a day (QID) | ORAL | Status: DC | PRN
  Administered 2024-02-16 – 2024-02-17 (×2): 650 mg via ORAL
  Filled 2024-02-16 (×2): qty 2

## 2024-02-16 MED ORDER — SODIUM CHLORIDE 0.9 % IV BOLUS
1000.0000 mL | Freq: Once | INTRAVENOUS | Status: AC
  Administered 2024-02-16: 1000 mL via INTRAVENOUS

## 2024-02-16 MED ORDER — POLYETHYLENE GLYCOL 3350 17 G PO PACK
17.0000 g | PACK | Freq: Every day | ORAL | Status: DC
  Administered 2024-02-16 – 2024-02-22 (×6): 17 g via ORAL
  Filled 2024-02-16 (×7): qty 1

## 2024-02-16 MED ORDER — ALUM & MAG HYDROXIDE-SIMETH 200-200-20 MG/5ML PO SUSP
30.0000 mL | ORAL | Status: DC | PRN
  Administered 2024-02-16 (×2): 30 mL via ORAL
  Filled 2024-02-16 (×2): qty 30

## 2024-02-16 MED ORDER — ACETAMINOPHEN 500 MG PO TABS
1000.0000 mg | ORAL_TABLET | Freq: Once | ORAL | Status: AC
  Administered 2024-02-16: 1000 mg via ORAL
  Filled 2024-02-16: qty 2

## 2024-02-16 MED ORDER — BISACODYL 10 MG RE SUPP
10.0000 mg | Freq: Every day | RECTAL | Status: DC | PRN
  Administered 2024-02-18: 10 mg via RECTAL
  Filled 2024-02-16: qty 1

## 2024-02-16 MED ORDER — ONDANSETRON HCL 4 MG PO TABS: 4.0000 mg | ORAL_TABLET | Freq: Four times a day (QID) | ORAL | Status: DC | PRN

## 2024-02-16 MED ORDER — ALBUTEROL SULFATE (2.5 MG/3ML) 0.083% IN NEBU
2.5000 mg | INHALATION_SOLUTION | Freq: Four times a day (QID) | RESPIRATORY_TRACT | Status: DC | PRN
  Administered 2024-02-17: 2.5 mg via RESPIRATORY_TRACT
  Filled 2024-02-16 (×2): qty 3

## 2024-02-16 MED ORDER — METHOCARBAMOL 500 MG PO TABS
500.0000 mg | ORAL_TABLET | Freq: Four times a day (QID) | ORAL | Status: DC | PRN
  Administered 2024-02-16 – 2024-02-21 (×7): 500 mg via ORAL
  Filled 2024-02-16 (×8): qty 1

## 2024-02-16 NOTE — ED Provider Notes (Addendum)
 " [place] [facility] AT [facility] Provider Note   CSN: [id number] Arrival date & time: 02/16/24  9074     Patient presents with: Knee Pain   Bridget Cortez is a 79 y.o. female.  presents to the ED for low hemoglobin.  Patient underwent left knee replacement on 02/11/2024 and was discharged on Thursday.  She was seen at rehab today where hemoglobin was low and was recommended to come here for further evaluation.  Patient endorses fatigue.  Patient did have anemia in the past and was taking iron  supplementation. She is taking Xarelto  for A-fib which she stopped 3 days prior to surgery.  Patient endorses pain and swelling to the left knee.  States that she has not taken anything for pain.  Denies any blood loss.  Denies nosebleeds.  Denies bleeding disorders.  Denies fever, cough, chills, chest pain, shortness of breath, nausea, vomiting, abdominal pain, diarrhea, weakness, numbness or tingling, headache, dizziness, or any other symptoms at this time.  Patient expressed concern regarding [facility].        Knee Pain Associated symptoms: fatigue        Prior to Admission medications  Medication Sig Start Date End Date Taking? Authorizing Provider  acetaminophen  (TYLENOL ) 500 MG tablet Take 1,000 mg by mouth every 6 (six) hours as needed for moderate pain (pain score 4-6) or headache.    [provider]  albuterol  (PROVENTIL ) (5 MG/ML) 0.5% nebulizer solution Take 0.5 mLs (2.5 mg total) by nebulization every 6 (six) hours as needed for wheezing or shortness of breath. 02/23/18   Curatolo, Adam, DO  amLODipine  (NORVASC ) 10 MG tablet Take 5 mg by mouth at bedtime.    [provider]  Ascorbic Acid  (VITAMIN C  WITH ROSE HIPS PO) Take 1 tablet by mouth daily.    [provider]  betamethasone  dipropionate 0.05 % cream Apply 1 application  topically 2 (two) times daily as needed (for psoriasis and keratosis pilaris - under the breasts or on  the stomach).    [provider]  Calcium  Carbonate-Vitamin D  (CALTRATE 600+D PO) Take 1 tablet by mouth in the morning.    [provider]  cetirizine (ZYRTEC) 10 MG tablet Take 10 mg by mouth in the morning.    [provider]  cholecalciferol (VITAMIN D3) 25 MCG (1000 UNIT) tablet Take 1,000 Units by mouth daily.    [provider]  esomeprazole (NEXIUM) 40 MG capsule Take 40 mg by mouth 2 (two) times daily before a meal.    [provider]  ferrous sulfate  325 (65 FE) MG tablet Take 325 mg by mouth 2 (two) times a week.    [provider]  Fluticasone -Salmeterol (ADVAIR ) 250-50 MCG/DOSE AEPB Inhale 1 puff into the lungs in the morning and at bedtime.    [provider]  loperamide  (IMODIUM ) 2 MG capsule Take 1 capsule (2 mg total) by mouth 2 (two) times daily as needed for diarrhea or loose stools. 04/06/23   Christopher Savannah, [name]  methocarbamol  (ROBAXIN ) 500 MG tablet Take 1 tablet (500 mg total) by mouth every 6 (six) hours as needed for muscle spasms. 02/12/24   Edmisten, Roxie CROME, PA  Multiple Vitamin (MULTIVITAMIN) tablet Take 1 tablet by mouth See admin instructions. Solgar Formula VM-75 Multivitamin with Chelated Minerals tablets - Take 1 tablet by mouth with breakfast    [provider]  nebivolol  (BYSTOLIC ) 5 MG tablet Take 1 tablet by mouth once daily 11/28/22  Fernande Elspeth BROCKS, MD  ondansetron  (ZOFRAN ) 4 MG tablet Take 1 tablet (4 mg total) by mouth every 6 (six) hours as needed for nausea. 02/12/24   Edmisten, Kristie L, PA  oxyCODONE  (OXY IR/ROXICODONE ) 5 MG immediate release tablet Take 1 tablet (5 mg total) by mouth every 4 (four) hours as needed for severe pain (pain score 7-10). 02/12/24   Edmisten, Kristie L, PA  polyethylene glycol (MIRALAX  / GLYCOLAX ) 17 g packet Take 17 g by mouth daily as needed for mild constipation. 02/12/24   Edmisten, Roxie CROME, PA  potassium chloride  SA (KLOR-CON ) 20 MEQ tablet Take 20 mEq by  mouth 3 (three) times daily.    [provider]  rivaroxaban  (XARELTO ) 20 MG TABS tablet TAKE 1 TABLET BY MOUTH ONCE DAILY WITH SUPPER 12/17/23   Almetta Donnice LABOR, MD  rosuvastatin  (CRESTOR ) 5 MG tablet Take 5 mg by mouth at bedtime.    [provider]  simethicone  (MYLICON) 125 MG chewable tablet Chew 125 mg by mouth every 6 (six) hours as needed for flatulence.    [provider]  sodium chloride  (OCEAN) 0.65 % SOLN nasal spray Place 1 spray into both nostrils as needed for congestion.    [provider]  SSD 1 % cream Apply 1 Application topically 2 (two) times daily as needed (for chafing OR for psoriasis and keratosis pilaris - under the breasts or on the stomach). 07/25/22   [provider]  SYSTANE HYDRATION PF 0.4-0.3 % SOLN Place 1 drop into both eyes 3 (three) times daily as needed (for dryness or inflammation).    [provider]  telmisartan (MICARDIS) 40 MG tablet Take 40 mg by mouth in the morning.    [provider]  torsemide  (DEMADEX ) 20 MG tablet Take 1 tablet (20 mg total) by mouth daily. 12/11/23   Almetta Donnice LABOR, MD  traMADol  (ULTRAM ) 50 MG tablet Take 1-2 tablets (50-100 mg total) by mouth every 6 (six) hours as needed for moderate pain (pain score 4-6). 02/12/24   Edmisten, Kristie L, PA  triamcinolone  cream (KENALOG) 0.1 % Apply 1 application  topically 2 (two) times daily as needed (for psoriasis and keratosis pilaris - under the breasts or on the stomach). 07/05/20   [provider]    Allergies: Ephedrine , Cortisone, and Fluticasone  propionate    Review of Systems  Constitutional:  Positive for fatigue.    Updated Vital Signs BP 105/69 (BP Location: Right Arm)   Pulse 93   Temp 98.2 F (36.8 C) (Oral)   Resp 18   SpO2 100%   Physical Exam Vitals and nursing note reviewed.  Constitutional:      General: She is not in acute distress.    Appearance: She is well-developed. She is not  ill-appearing, toxic-appearing or diaphoretic.     Comments: Appears tearful   HENT:     Head: Normocephalic and atraumatic.  Eyes:     Conjunctiva/sclera: Conjunctivae normal.  Cardiovascular:     Rate and Rhythm: Normal rate and regular rhythm.     Heart sounds: No murmur heard. Pulmonary:     Effort: Pulmonary effort is normal. No respiratory distress.     Breath sounds: Normal breath sounds.  Abdominal:     Palpations: Abdomen is soft.     Tenderness: There is no abdominal tenderness.  Musculoskeletal:        General: No swelling.     Cervical back: Neck supple.     Comments: Moderate swelling  and post surgery scar to the left knee with tenderness. No erythema noted.  Limited range of motion due to pain.  Scar is wrapped in bandage with no obvious active bleeding at this time.  Sensation intact.  Distal pulses palpable bilaterally.  Skin:    General: Skin is warm and dry.     Capillary Refill: Capillary refill takes less than 2 seconds.     Coloration: Skin is not pale.  Neurological:     Mental Status: She is alert.  Psychiatric:        Mood and Affect: Mood normal.     (all labs ordered are listed, but only abnormal results are displayed) Labs Reviewed  CBC WITH DIFFERENTIAL/PLATELET - Abnormal; Notable for the following components:      Result Value   RBC 3.03 (*)    Hemoglobin 8.1 (*)    HCT 25.2 (*)    RDW 16.8 (*)    Neutro Abs 8.2 (*)    Monocytes Absolute 1.3 (*)    Abs Immature Granulocytes 0.08 (*)    All other components within normal limits  COMPREHENSIVE METABOLIC PANEL WITH GFR - Abnormal; Notable for the following components:   Sodium 134 (*)    Albumin 3.2 (*)    All other components within normal limits  PROTIME-INR - Abnormal; Notable for the following components:   Prothrombin Time 18.2 (*)    INR 1.4 (*)    All other components within normal limits  APTT - Abnormal; Notable for the following components:   aPTT 46 (*)    All other components  within normal limits  FIBRINOGEN  I-STAT CG4 LACTIC ACID, ED  TYPE AND SCREEN    EKG: None  Radiology: No results found.   Procedures   Medications Ordered in the ED  sodium chloride  0.9 % bolus 1,000 mL ( Intravenous Restarted 02/16/24 1225)  acetaminophen  (TYLENOL ) tablet 1,000 mg (1,000 mg Oral Given 02/16/24 1235)                                    Medical Decision Making Amount and/or Complexity of Data Reviewed Labs: ordered.  Risk OTC drugs. Decision regarding hospitalization.   This patient presents to the ED for concern of low hemoglobin and left knee swelling postsurgery. this involves an extensive number of treatment options, and is a complaint that carries with it a high risk of complications and morbidity.  The differential diagnosis includes anemia, post surgery complication,, GI bleed.   Co morbidities / Chronic conditions that complicate the patient evaluation  Left knee replacement on 02/11/2024.   Additional history obtained:  Additional history obtained from EMR External records from outside source obtained and reviewed including surgery and discharge notes from 02/11/2024.   Lab Tests:  I Ordered, and personally interpreted labs.  The pertinent results include: Globin at 8.1 which is decreased from 3 days ago, elevated INR and APTT   Cardiac Monitoring: / EKG:  The patient was maintained on a cardiac monitor.  I personally viewed and interpreted the cardiac monitored which showed an underlying rhythm of: Normal sinus  ED Course / Critical interventions / Medication management   I ordered medication including Tylenol  Reevaluation of the patient after these medicines showed that the patient improved I have reviewed the patients home medicines and have made adjustments as needed    Dispostion: Patient is a 79 year old female presenting today for left knee  pain and low hemoglobin level.  She recently had left knee replacement on 02/11/2024.   Patient was seen today at rehab where her hemoglobin level was 8.5 and was recommended to come here for further evaluation.  Patient is currently on Xarelto  for A-fib.  Denies pain.  Patient is alert and oriented with no apparent distress.  Well-appearing and appears comfortable on exam.  No signs of pallor.  Abdomen soft nontender.  Lungs clear.  Heart regular for tachycardic.  There is a surgical scar to the left knee with moderate swelling and tenderness.  No erythema or active bleeding noted to the knee.  Distal pulses palpable.  Sensation intact.  Initially tachycardic at 117 bpm but other vital signs are stable at this time.  No signs of septic arthritis.  Offered patient pain medication but patient declined at this time and would like only Tylenol .   Hemoglobin at 8.1, decreased from 3 days ago.  CMP unremarkable.  Elevated INR, APTT no sign Electra acidosis.  Discussed these results with the patient.  With that downtrending hemoglobin, will plan to admit patient for observation and for further evaluation.    Called hospitalist who recommended to consult orthopedic first.  Case was discussed with [facility] who will evaluate the patient. Dr. Melodi, MD from [facility] evaluated the patient and noted no active bleeding on the knee, recommended admission.  He noted that the swelling is due to the recent surgeries but no other intervention was required at this time.  Case discussed with Dr. Celinda, MD, who will admit the patient for low hemoglobin.  Patient is hemodynamically stable.    Case also discussed with Dr. Patsey, MD, who assessed the patient and is in agreement with plan.       Final diagnoses:  Left knee pain, unspecified chronicity  Low hemoglobin    ED Discharge Orders     None          Braxton Dubois, [name] 02/16/24 1451    Braxton Dubois, [name] 02/16/24 1454    Patsey Lot, MD 02/16/24 1507  "

## 2024-02-16 NOTE — ED Notes (Signed)
 ED TO INPATIENT HANDOFF REPORT  Name/Age/Gender Bridget Cortez 79 y.o. female  Code Status Code Status History     Date Active Date Inactive Code Status Order ID Comments User Context   02/11/2024 1842 02/14/2024 2048 Full Code [id number]  [name], [name] Inpatient   09/09/2019 1425 09/13/2019 0006 Full Code [id number]  [name] Inpatient   08/03/2017 1748 08/04/2017 2156 Full Code [id number]  [name], [name] ED   08/06/2014 1250 08/11/2014 2048 Full Code [id number]  [name], [name] Inpatient    Questions for Most Recent Historical Code Status (Order [id number])     Question Answer   By: Procedural case: previous code status reviewed            Home/SNF/Other SNF  Chief Complaint Symptomatic anemia [D64.9]  Level of Care/Admitting Diagnosis ED Disposition     ED Disposition  Admit   Condition  --   Comment  Hospital Area: [facility] [[id number]]  Level of Care: Telemetry [5]  Admit to tele based on following criteria: Monitor QTC interval  Admit to tele based on following criteria: Other see comments  Comments: Hx of CHF and PAF.  May place patient in [facility] at [facility] or [facility] if equivalent level of care is available:: No  Diagnosis: Symptomatic anemia [8671310]  Admitting Physician: CELINDA ALM LOT [[id number]]  Attending Physician: CELINDA ALM LOT [[id number]]          Medical History Past Medical History:  Diagnosis Date   Abnormal Pap smear 2006   vaginal medicine according to patient   Allergy    Arthritis    Asthma    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 eye    cataract removed.   Chiari malformation    Complication of anesthesia    woke up during left breast surgery age 29   Diverticulosis    Dysrhythmia    SVT   GERD (gastroesophageal reflux disease)    H/O osteopenia     08/2006   H/O varicella    Heart murmur    Hypertension    Monilial vaginitis 07/2007   Multiple allergies    Ovarian cyst    Personal history of colonic adenomas 03/13/2012   Personal history of radiation therapy 1990   Pneumonia 07/2014   VIN III (vulvar intraepithelial neoplasia III) 03/2009   Yeast infection     Allergies Allergies[1]  IV Location/Drains/Wounds Patient Lines/Drains/Airways Status     Active Line/Drains/Airways     Name Placement date Placement time Site Days   Peripheral IV 02/16/24 20 G 1 Right Antecubital 02/16/24  1040  Antecubital  less than 1   Wound 02/11/24 1428 Surgical Closed Surgical Incision Knee Left 02/11/24  1428  Knee  5            Labs/Imaging Results for orders placed or performed during the hospital encounter of 02/16/24 (from the past 48 hours)  CBC with Differential     Status: Abnormal   Collection Time: 02/16/24 10:30 AM  Result Value Ref Range   WBC 10.5 4.0 - 10.5 K/uL   RBC 3.03 (L) 3.87 - 5.11 MIL/uL   Hemoglobin 8.1 (L) 12.0 - 15.0 g/dL   HCT 74.7 (L) 63.9 - 53.9 %   MCV 83.2 80.0 - 100.0 fL   MCH 26.7 26.0 - 34.0 pg  MCHC 32.1 30.0 - 36.0 g/dL   RDW 83.1 (H) 88.4 - 84.4 %   Platelets 284 150 - 400 K/uL   nRBC 0.0 0.0 - 0.2 %   Neutrophils Relative % 77 %   Neutro Abs 8.2 (H) 1.7 - 7.7 K/uL   Lymphocytes Relative 9 %   Lymphs Abs 1.0 0.7 - 4.0 K/uL   Monocytes Relative 12 %   Monocytes Absolute 1.3 (H) 0.1 - 1.0 K/uL   Eosinophils Relative 1 %   Eosinophils Absolute 0.1 0.0 - 0.5 K/uL   Basophils Relative 0 %   Basophils Absolute 0.0 0.0 - 0.1 K/uL   Immature Granulocytes 1 %   Abs Immature Granulocytes 0.08 (H) 0.00 - 0.07 K/uL    Comment: Performed at [facility], [address] [place], [place] [address]  Comprehensive metabolic panel     Status: Abnormal   Collection Time: 02/16/24 10:30 AM  Result Value Ref Range   Sodium 134 (L) 135 - 145 mmol/L   Potassium 4.2 3.5 - 5.1 mmol/L    Chloride 98 98 - 111 mmol/L   CO2 27 22 - 32 mmol/L   Glucose, Bld 96 70 - 99 mg/dL    Comment: Glucose reference range applies only to samples taken after fasting for at least 8 hours.   BUN 16 8 - 23 mg/dL   Creatinine, Ser 9.29 0.44 - 1.00 mg/dL   Calcium  9.0 8.9 - 10.3 mg/dL   Total Protein 6.5 6.5 - 8.1 g/dL   Albumin 3.2 (L) 3.5 - 5.0 g/dL   AST 19 15 - 41 U/L   ALT 9 0 - 44 U/L   Alkaline Phosphatase 63 38 - 126 U/L   Total Bilirubin 0.8 0.0 - 1.2 mg/dL   GFR, Estimated >39 >39 mL/min    Comment: (NOTE) Calculated using the CKD-EPI Creatinine Equation (2021)    Anion gap 9 5 - 15    Comment: Performed at [facility], [address], [place], [place] [address]  Protime-INR (coagulopathy lab panel)     Status: Abnormal   Collection Time: 02/16/24 10:30 AM  Result Value Ref Range   Prothrombin Time 18.2 (H) 11.4 - 15.2 seconds   INR 1.4 (H) 0.8 - 1.2    Comment: (NOTE) INR goal varies based on device and disease states. Performed at [facility], [address], [place], [place] [address]   APTT (coagulopathy lab panel)     Status: Abnormal   Collection Time: 02/16/24 10:30 AM  Result Value Ref Range   aPTT 46 (H) 24 - 36 seconds    Comment:        IF BASELINE aPTT IS ELEVATED, SUGGEST PATIENT RISK ASSESSMENT BE USED TO DETERMINE APPROPRIATE ANTICOAGULANT THERAPY. Performed at [facility], [address] [place], [place] [address]   Type and screen [facility]     Status: None   Collection Time: 02/16/24 10:45 AM  Result Value Ref Range   ABO/RH(D) O POS    Antibody Screen NEG    Sample Expiration      02/19/2024,2359 Performed at [facility], [address], [place], [place] [address]   I-Stat CG4 Lactic Acid     Status: None   Collection Time: 02/16/24 10:48 AM  Result Value Ref Range   Lactic Acid, Venous 1.0 0.5 - 1.9 mmol/L   No results found.  Pending  Labs [employer] (From admission, onward)  Start     Ordered   02/16/24 1030  Fibrinogen  Once,   R        02/16/24 1030            Vitals/Pain Today's Vitals   02/16/24 1235 02/16/24 1328 02/16/24 1329 02/16/24 1330  BP:  105/69    Pulse:  93    Resp:  18    Temp:  98.2 F (36.8 C)    TempSrc:  Oral    SpO2:  100% 100%   PainSc: 7    6     Isolation Precautions No active isolations  Medications Medications  sodium chloride  0.9 % bolus 1,000 mL ( Intravenous Restarted 02/16/24 1225)  acetaminophen  (TYLENOL ) tablet 1,000 mg (1,000 mg Oral Given 02/16/24 1235)    Mobility non-ambulatory    [1]  Allergies Allergen Reactions   Ephedrine  Shortness Of Breath   Cortisone Other (See Comments)    Severe muscle spasms with certain types of cortisone   Fluticasone  Propionate     Caused nose bleeds

## 2024-02-16 NOTE — Consult Note (Signed)
 Reason for Consult:Right knee pain and bleeding post total knee Referring Physician: Patsey, and [facility]  Bridget Cortez is an 79 y.o. female.  HPI: 79 yo female who is 5 days post op from a left total knee. Patient discharged to SNF [facility]) and was noted to be tachycardic and having decreased Hgb (trending down) and increased pain and swelling in the left knee and leg. She was transported to the ED for eval and treatment.  Past Medical History:  Diagnosis Date   Abnormal Pap smear 2006   vaginal medicine according to patient   Allergy    Arthritis    Asthma    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 eye    cataract removed.   Chiari malformation    Complication of anesthesia    woke up during left breast surgery age 36   Diverticulosis    Dysrhythmia    SVT   GERD (gastroesophageal reflux disease)    H/O osteopenia    08/2006   H/O varicella    Heart murmur    Hypertension    Monilial vaginitis 07/2007   Multiple allergies    Ovarian cyst    Personal history of colonic adenomas 03/13/2012   Personal history of radiation therapy 1990   Pneumonia 07/2014   VIN III (vulvar intraepithelial neoplasia III) 03/2009   Yeast infection     Past Surgical History:  Procedure Laterality Date   ABDOMINAL HYSTERECTOMY     ABDOMINAL SURGERY     BREAST DUCTAL SYSTEM EXCISION Right 08/08/2022   Procedure: RIGHT BREAST CENTRAL DUCT EXCISION;  Surgeon: Aron Shoulders, MD;  Location: [facility] OR;  Service: General;  Laterality: Right;   BREAST EXCISIONAL BIOPSY Right    benign   BREAST LUMPECTOMY Left 1990   BREAST SURGERY     Lumpectomy, left breast   CARDIOVERSION N/A 07/28/2020   Procedure: CARDIOVERSION;  Surgeon: Kate Lonni CROME, MD;  Location: [facility] ENDOSCOPY;  Service: Cardiovascular;  Laterality: N/A;   CATARACT EXTRACTION Left 2018   COLONOSCOPY      COLONOSCOPY W/ BIOPSIES     EYE SURGERY     HERNIA REPAIR     KNEE SURGERY     right   lt. eye aneurysm surgery Left 2017   MOUTH SURGERY  2019   rt. side gum surgery  Lt. done 3 years ago   RADIOACTIVE SEED GUIDED EXCISIONAL BREAST BIOPSY Right 07/09/2018   Procedure: RADIOACTIVE SEED GUIDED EXCISIONAL RIGHT  BREAST BIOPSY;  Surgeon: Aron Shoulders, MD;  Location: [facility] OR;  Service: General;  Laterality: Right;   TOTAL KNEE ARTHROPLASTY Right 09/09/2019   Procedure: RIGHT TOTAL KNEE ARTHROPLASTY;  Surgeon: Sheril Coy, MD;  Location: [facility] ORS;  Service: Orthopedics;  Laterality: Right;   TOTAL KNEE ARTHROPLASTY Left 02/11/2024   Procedure: ARTHROPLASTY, KNEE, TOTAL;  Surgeon: Melodi Lerner, MD;  Location: [facility] ORS;  Service: Orthopedics;  Laterality: Left;    Family History  Problem Relation Age of Onset   Cancer Mother    Heart failure Other    Colon cancer Neg Hx    Esophageal cancer Neg Hx    Rectal cancer Neg Hx    Stomach cancer Neg Hx    Colon polyps Neg Hx     Social History:  reports that she quit smoking about 46 years ago. Cortez smoking use included cigarettes. She has  never used smokeless tobacco. She reports that she does not drink alcohol  and does not use drugs.  Allergies: Allergies[1]  Medications: I have reviewed the patient's current medications.  Results for orders placed or performed during the hospital encounter of 02/16/24 (from the past 48 hours)  CBC with Differential     Status: Abnormal   Collection Time: 02/16/24 10:30 AM  Result Value Ref Range   WBC 10.5 4.0 - 10.5 K/uL   RBC 3.03 (L) 3.87 - 5.11 MIL/uL   Hemoglobin 8.1 (L) 12.0 - 15.0 g/dL   HCT 74.7 (L) 63.9 - 53.9 %   MCV 83.2 80.0 - 100.0 fL   MCH 26.7 26.0 - 34.0 pg   MCHC 32.1 30.0 - 36.0 g/dL   RDW 83.1 (H) 88.4 - 84.4 %   Platelets 284 150 - 400 K/uL   nRBC 0.0 0.0 - 0.2 %   Neutrophils Relative % 77 %   Neutro Abs 8.2 (H) 1.7 - 7.7 K/uL   Lymphocytes Relative 9 %   Lymphs Abs 1.0 0.7 -  4.0 K/uL   Monocytes Relative 12 %   Monocytes Absolute 1.3 (H) 0.1 - 1.0 K/uL   Eosinophils Relative 1 %   Eosinophils Absolute 0.1 0.0 - 0.5 K/uL   Basophils Relative 0 %   Basophils Absolute 0.0 0.0 - 0.1 K/uL   Immature Granulocytes 1 %   Abs Immature Granulocytes 0.08 (H) 0.00 - 0.07 K/uL    Comment: Performed at [facility], [address] [place], [place] [address]  Comprehensive metabolic panel     Status: Abnormal   Collection Time: 02/16/24 10:30 AM  Result Value Ref Range   Sodium 134 (L) 135 - 145 mmol/L   Potassium 4.2 3.5 - 5.1 mmol/L   Chloride 98 98 - 111 mmol/L   CO2 27 22 - 32 mmol/L   Glucose, Bld 96 70 - 99 mg/dL    Comment: Glucose reference range applies only to samples taken after fasting for at least 8 hours.   BUN 16 8 - 23 mg/dL   Creatinine, Ser 9.29 0.44 - 1.00 mg/dL   Calcium  9.0 8.9 - 10.3 mg/dL   Total Protein 6.5 6.5 - 8.1 g/dL   Albumin 3.2 (L) 3.5 - 5.0 g/dL   AST 19 15 - 41 U/L   ALT 9 0 - 44 U/L   Alkaline Phosphatase 63 38 - 126 U/L   Total Bilirubin 0.8 0.0 - 1.2 mg/dL   GFR, Estimated >39 >39 mL/min    Comment: (NOTE) Calculated using the CKD-EPI Creatinine Equation (2021)    Anion gap 9 5 - 15    Comment: Performed at [facility], [address], [place], [place] [address]  Protime-INR (coagulopathy lab panel)     Status: Abnormal   Collection Time: 02/16/24 10:30 AM  Result Value Ref Range   Prothrombin Time 18.2 (H) 11.4 - 15.2 seconds   INR 1.4 (H) 0.8 - 1.2    Comment: (NOTE) INR goal varies based on device and disease states. Performed at [facility], [address], [place], [place] [address]   APTT (coagulopathy lab panel)     Status: Abnormal   Collection Time: 02/16/24 10:30 AM  Result Value Ref Range   aPTT 46 (H) 24 - 36 seconds    Comment:        IF BASELINE aPTT IS ELEVATED, SUGGEST PATIENT RISK ASSESSMENT BE USED TO DETERMINE APPROPRIATE ANTICOAGULANT  THERAPY.  Performed at [facility], [address], [place], [place] [address]   Type and screen [facility]     Status: None   Collection Time: 02/16/24 10:45 AM  Result Value Ref Range   ABO/RH(D) O POS    Antibody Screen NEG    Sample Expiration      02/19/2024,2359 Performed at [facility], [address] [place], [place] [address]   I-Stat CG4 Lactic Acid     Status: None   Collection Time: 02/16/24 10:48 AM  Result Value Ref Range   Lactic Acid, Venous 1.0 0.5 - 1.9 mmol/L    No results found.  Review of Systems Blood pressure 105/69, pulse 93, temperature 98.2 F (36.8 C), temperature source Oral, resp. rate 18, SpO2 100%. Physical Exam Patient is reclining in an ED stretcher in moderate distress. Cortez pulse in the room was 117.  Left leg with obvious swelling and bruising consistent with recent knee surgery. Bandage changed to fresh Aquacel. Old blood and saturated bandage removed. No active bleeding noted. No pain with ankle pumps.  Assessment/Plan: Left knee swelling and pain consistent with normal post op course especially as the patient is on Xarelto  for A Fib. Plan per EDP is for admission and medical stabilization and optimization.  Ortho to follow. Will order PT and icing for knee. No need for any surgical intervention.  Will make Dr Alusio's team aware of the admission  Bridget Cortez 02/16/2024, 2:46 PM         [1]  Allergies Allergen Reactions   Ephedrine  Shortness Of Breath   Cortisone Other (See Comments)    Severe muscle spasms with certain types of cortisone   Fluticasone  Propionate     Caused nose bleeds

## 2024-02-16 NOTE — Progress Notes (Signed)
" °  Progress Note   Patient: Bridget Cortez FMW:[id number] DOB: November 19, 1945 DOA: 02/16/2024     0 DOS: the patient was seen and examined on 02/16/2024  The chart has been reviewed and case discussed with the EDP. We are planning to observe the patient and trend her hemoglobin once orthopedic surgery is aware to see if she needs any adjustments to her post-op care plan.    Author: Alm Dorn Castor, MD 02/16/2024 12:53 PM  For on call review [email or web address].  "

## 2024-02-16 NOTE — ED Triage Notes (Signed)
 Pt BIBA from [facility] for left knee swelling and bleeding. Had knee replacement Monday. [facility] checked out bleeding yesterday and sent pt back. Bleeding has worsened.  130 palp HR 94 98% ra 98.6 temp

## 2024-02-16 NOTE — Progress Notes (Signed)
 Dr. Celinda notified of fibrinogen level of >800.

## 2024-02-16 NOTE — H&P (Signed)
 " History and Physical    Patient: Bridget Cortez FMW:[id number] DOB: Oct 08, 1945 DOA: 02/16/2024 DOS: the patient was seen and examined on 02/16/2024 PCP: Janey Santos, MD  Patient coming from: Home  Chief Complaint:  Chief Complaint  Patient presents with   Knee Pain   HPI: Bridget Cortez is a 79 y.o. female with medical history significant of seasonal allergies, asthma, osteoarthritis, vaginal atrophy, left breast cancer, community-acquired pneumonia, Chiari malformation, bilateral cataracts, diverticulosis, SVT, GERD, osteopenia, varicella zoster who underwent a knee replacement therapy with Dr. Melodi 5 days ago who is coming to the emergency department due to knee pain and bleeding from the surgical site that started 2 days ago.  She has felt occasional lightheaded and mildly dyspneic.  She was seen by orthopedic surgery yesterday.  He denied fever, chills, rhinorrhea, sore throat, wheezing or hemoptysis.  No chest pain, palpitations, diaphoresis, PND, orthopnea or pitting edema of the lower extremities.  Positive abdominal pain and constipation, no emesis, diarrhea, melena or hematochezia.  No flank pain, dysuria, frequency or hematuria.  No polyuria, polydipsia, polyphagia or blurred vision.   ED course: Initial vital signs were temperature 98.5 F, pulse 117, respiration 18, BP 122/78 mmHg and O2 sat 98% on room air.  The patient received acetaminophen  1000 mg p.o. and 1000 mL of normal saline bolus.  Lab work: CBC showed white count of 10.5, hemoglobin 8.1 g/dL (preop hemoglobin level was 10.6 g/dL 16 days ago) and platelets 284.  PT 18.2 INR 1.4 and PTT 46.  CMP showed a sodium 134 mmol/L and albumin of 3.2 g/dL, but was otherwise normal.    Review of Systems: As mentioned in the history of present illness. All other systems reviewed and are negative. Past Medical History:  Diagnosis Date   Abnormal Pap smear 2006   vaginal medicine according to patient    Allergy    Arthritis    Asthma    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 eye    cataract removed.   Chiari malformation    Complication of anesthesia    woke up during left breast surgery age 23   Diverticulosis    Dysrhythmia    SVT   GERD (gastroesophageal reflux disease)    H/O osteopenia    08/2006   H/O varicella    Heart murmur    Hypertension    Monilial vaginitis 07/2007   Multiple allergies    Ovarian cyst    Personal history of colonic adenomas 03/13/2012   Personal history of radiation therapy 1990   Pneumonia 07/2014   VIN III (vulvar intraepithelial neoplasia III) 03/2009   Yeast infection    Past Surgical History:  Procedure Laterality Date   ABDOMINAL HYSTERECTOMY     ABDOMINAL SURGERY     BREAST DUCTAL SYSTEM EXCISION Right 08/08/2022   Procedure: RIGHT BREAST CENTRAL DUCT EXCISION;  Surgeon: Aron Shoulders, MD;  Location: [facility] OR;  Service: General;  Laterality: Right;   BREAST EXCISIONAL BIOPSY Right    benign   BREAST LUMPECTOMY Left 1990   BREAST SURGERY     Lumpectomy, left breast   CARDIOVERSION N/A 07/28/2020   Procedure: CARDIOVERSION;  Surgeon: Kate Lonni CROME, MD;  Location: [facility] ENDOSCOPY;  Service: Cardiovascular;  Laterality: N/A;   CATARACT EXTRACTION Left 2018   COLONOSCOPY     COLONOSCOPY W/ BIOPSIES  EYE SURGERY     HERNIA REPAIR     KNEE SURGERY     right   lt. eye aneurysm surgery Left 2017   MOUTH SURGERY  2019   rt. side gum surgery  Lt. done 3 years ago   RADIOACTIVE SEED GUIDED EXCISIONAL BREAST BIOPSY Right 07/09/2018   Procedure: RADIOACTIVE SEED GUIDED EXCISIONAL RIGHT  BREAST BIOPSY;  Surgeon: Aron Shoulders, MD;  Location: [facility] OR;  Service: General;  Laterality: Right;   TOTAL KNEE ARTHROPLASTY Right 09/09/2019   Procedure: RIGHT TOTAL KNEE ARTHROPLASTY;  Surgeon: Sheril Coy, MD;  Location: [facility] ORS;  Service:  Orthopedics;  Laterality: Right;   TOTAL KNEE ARTHROPLASTY Left 02/11/2024   Procedure: ARTHROPLASTY, KNEE, TOTAL;  Surgeon: Melodi Lerner, MD;  Location: [facility] ORS;  Service: Orthopedics;  Laterality: Left;   Social History:  reports that she quit smoking about 46 years ago. Her smoking use included cigarettes. She has never used smokeless tobacco. She reports that she does not drink alcohol  and does not use drugs.  Allergies[1]  Family History  Problem Relation Age of Onset   Cancer Mother    Heart failure Other    Colon cancer Neg Hx    Esophageal cancer Neg Hx    Rectal cancer Neg Hx    Stomach cancer Neg Hx    Colon polyps Neg Hx     Prior to Admission medications  Medication Sig Start Date End Date Taking? Authorizing Provider  acetaminophen  (TYLENOL ) 500 MG tablet Take 1,000 mg by mouth every 6 (six) hours as needed for moderate pain (pain score 4-6) or headache.    [provider]  albuterol  (PROVENTIL ) (5 MG/ML) 0.5% nebulizer solution Take 0.5 mLs (2.5 mg total) by nebulization every 6 (six) hours as needed for wheezing or shortness of breath. 02/23/18   Curatolo, Adam, DO  amLODipine  (NORVASC ) 10 MG tablet Take 5 mg by mouth at bedtime.    [provider]  Ascorbic Acid  (VITAMIN C  WITH ROSE HIPS PO) Take 1 tablet by mouth daily.    [provider]  betamethasone  dipropionate 0.05 % cream Apply 1 application  topically 2 (two) times daily as needed (for psoriasis and keratosis pilaris - under the breasts or on the stomach).    [provider]  Calcium  Carbonate-Vitamin D  (CALTRATE 600+D PO) Take 1 tablet by mouth in the morning.    [provider]  cetirizine (ZYRTEC) 10 MG tablet Take 10 mg by mouth in the morning.    [provider]  cholecalciferol (VITAMIN D3) 25 MCG (1000 UNIT) tablet Take 1,000 Units by mouth daily.    [provider]  esomeprazole (NEXIUM) 40 MG capsule Take 40 mg by mouth 2 (two) times daily  before a meal.    [provider]  ferrous sulfate  325 (65 FE) MG tablet Take 325 mg by mouth 2 (two) times a week.    [provider]  Fluticasone -Salmeterol (ADVAIR ) 250-50 MCG/DOSE AEPB Inhale 1 puff into the lungs in the morning and at bedtime.    [provider]  loperamide  (IMODIUM ) 2 MG capsule Take 1 capsule (2 mg total) by mouth 2 (two) times daily as needed for diarrhea or loose stools. 04/06/23   Christopher Savannah, [name]  methocarbamol  (ROBAXIN ) 500 MG tablet Take 1 tablet (500 mg total) by mouth every 6 (six) hours as needed for muscle spasms. 02/12/24   Edmisten, Kristie L, PA  Multiple Vitamin (MULTIVITAMIN) tablet Take 1 tablet by mouth See admin  instructions. Solgar Formula VM-75 Multivitamin with Chelated Minerals tablets - Take 1 tablet by mouth with breakfast    [provider]  nebivolol  (BYSTOLIC ) 5 MG tablet Take 1 tablet by mouth once daily 11/28/22   Fernande Elspeth BROCKS, MD  ondansetron  (ZOFRAN ) 4 MG tablet Take 1 tablet (4 mg total) by mouth every 6 (six) hours as needed for nausea. 02/12/24   Edmisten, Kristie L, PA  oxyCODONE  (OXY IR/ROXICODONE ) 5 MG immediate release tablet Take 1 tablet (5 mg total) by mouth every 4 (four) hours as needed for severe pain (pain score 7-10). 02/12/24   Edmisten, Kristie L, PA  polyethylene glycol (MIRALAX  / GLYCOLAX ) 17 g packet Take 17 g by mouth daily as needed for mild constipation. 02/12/24   Edmisten, Roxie CROME, PA  potassium chloride  SA (KLOR-CON ) 20 MEQ tablet Take 20 mEq by mouth 3 (three) times daily.    [provider]  rivaroxaban  (XARELTO ) 20 MG TABS tablet TAKE 1 TABLET BY MOUTH ONCE DAILY WITH SUPPER 12/17/23   Almetta Donnice LABOR, MD  rosuvastatin  (CRESTOR ) 5 MG tablet Take 5 mg by mouth at bedtime.    [provider]  simethicone  (MYLICON) 125 MG chewable tablet Chew 125 mg by mouth every 6 (six) hours as needed for flatulence.    [provider]  sodium chloride  (OCEAN) 0.65 %  SOLN nasal spray Place 1 spray into both nostrils as needed for congestion.    [provider]  SSD 1 % cream Apply 1 Application topically 2 (two) times daily as needed (for chafing OR for psoriasis and keratosis pilaris - under the breasts or on the stomach). 07/25/22   [provider]  SYSTANE HYDRATION PF 0.4-0.3 % SOLN Place 1 drop into both eyes 3 (three) times daily as needed (for dryness or inflammation).    [provider]  telmisartan (MICARDIS) 40 MG tablet Take 40 mg by mouth in the morning.    [provider]  torsemide  (DEMADEX ) 20 MG tablet Take 1 tablet (20 mg total) by mouth daily. 12/11/23   Almetta Donnice LABOR, MD  traMADol  (ULTRAM ) 50 MG tablet Take 1-2 tablets (50-100 mg total) by mouth every 6 (six) hours as needed for moderate pain (pain score 4-6). 02/12/24   Edmisten, Kristie L, PA  triamcinolone  cream (KENALOG) 0.1 % Apply 1 application  topically 2 (two) times daily as needed (for psoriasis and keratosis pilaris - under the breasts or on the stomach). 07/05/20   [provider]    Physical Exam: Vitals:   02/16/24 0934 02/16/24 1030 02/16/24 1328 02/16/24 1329  BP: 122/78 126/77 105/69   Pulse: (!) 117  93   Resp: 20  18   Temp: 98.5 F (36.9 C)  98.2 F (36.8 C)   TempSrc: Oral  Oral   SpO2: 97%  100% 100%   Physical Exam Vitals reviewed.  Constitutional:      General: She is awake. She is not in acute distress. HENT:     Head: Normocephalic.     Nose: No rhinorrhea.     Mouth/Throat:     Mouth: Mucous membranes are moist.  Eyes:     General: No scleral icterus.    Pupils: Pupils are equal, round, and reactive to light.  Neck:     Vascular: No JVD.  Cardiovascular:     Rate and Rhythm: Normal rate and regular rhythm.     Heart sounds: S1 normal and S2 normal.  Pulmonary:  Effort: Pulmonary effort is normal.     Breath sounds: Normal breath sounds.  Abdominal:     General: Bowel sounds are normal. There  is no distension.     Palpations: Abdomen is soft.     Tenderness: There is no abdominal tenderness. There is no right CVA tenderness or left CVA tenderness.  Musculoskeletal:     Cervical back: Neck supple.     Right lower leg: No edema.     Left lower leg: Edema present.  Neurological:     General: No focal deficit present.     Mental Status: She is alert and oriented to person, place, and time.  Psychiatric:        Mood and Affect: Mood normal.        Behavior: Behavior normal. Behavior is cooperative.     Data Reviewed:  Results are pending, will review when available.  10/30/2023 TTE REPORT. IMPRESSIONS:   1. Left ventricular ejection fraction, by estimation, is 60 to 65%. The  left ventricle has normal function. The left ventricle has no regional  wall motion abnormalities. There is mild left ventricular hypertrophy.  Left ventricular diastolic parameters  were normal.   2. Right ventricular systolic function is normal. The right ventricular  size is mildly enlarged. There is normal pulmonary artery systolic  pressure.   3. Left atrial size was mildly dilated.   4. The mitral valve is normal in structure. No evidence of mitral valve  regurgitation. No evidence of mitral stenosis.   5. The aortic valve is tricuspid. Aortic valve regurgitation is not  visualized.   6. The inferior vena cava is dilated in size with >50% respiratory  variability, suggesting right atrial pressure of 8 mmHg.   Comparison(s): Unable to view 2019 study.   EKG: Vent. rate 101 BPM  PR interval 178 ms  QRS duration 106 ms  QT/QTcB 339/440 ms  P-R-T axes 72 19 95  Sinus tachycardia  Atrial premature complexes  Nonspecific T abnormalities, lateral leads  Baseline wander in lead(s) V1 V2  Assessment and Plan: Principal Problem:   Symptomatic anemia Observation/telemetry. Hold DOAC. Monitor H&H. Transfuse as needed. Orthopedic surgery will evaluate in AM.  Active Problems:   Knee  pain Analgesics as needed. Ortho consult.    Essential hypertension Continue Nebivolol  5 mg p.o. daily.    SVT (supraventricular tachycardia) On Nebivolol . Keep electrolytes optimized.    (HFpEF) heart failure with preserved ejection fraction (HCC) Seems to be euvolemic.SABRA Continue Nebivolol . Continue ARB pending med rec.    PAF (paroxysmal atrial fibrillation) (HCC) CHA?DS?-VASc Score of at least 6. Hold rivaroxaban . Will continue beta-blocker for rate control.    Constipation Once resumed MiraLAX . Dulcolax suppository ordered as needed. A smog enema ordered as needed.      Advance Care Planning:   Code Status: Full Code   Consults: Orthopedic surgery.  Family Communication:   Severity of Illness: The appropriate patient status for this patient is OBSERVATION. Observation status is judged to be reasonable and necessary in order to provide the required intensity of service to ensure the patient's safety. The patient's presenting symptoms, physical exam findings, and initial radiographic and laboratory data in the context of their medical condition is felt to place them at decreased risk for further clinical deterioration. Furthermore, it is anticipated that the patient will be medically stable for discharge from the hospital within 2 midnights of admission.   Author: Alm Dorn Castor, MD 02/16/2024 3:08 PM  For on call review  [email or web address]   This document was prepared using Dragon voice recognition software and may contain some unintended transcription errors.     [1]  Allergies Allergen Reactions   Ephedrine  Shortness Of Breath   Cortisone Other (See Comments)    Severe muscle spasms with certain types of cortisone   Fluticasone  Propionate     Caused nose bleeds   "

## 2024-02-16 NOTE — Plan of Care (Signed)

## 2024-02-17 ENCOUNTER — Encounter (HOSPITAL_COMMUNITY): Payer: Self-pay

## 2024-02-17 DIAGNOSIS — D649 Anemia, unspecified: Secondary | ICD-10-CM | POA: Diagnosis not present

## 2024-02-17 LAB — COMPREHENSIVE METABOLIC PANEL WITH GFR
ALT: 12 U/L (ref 0–44)
AST: 25 U/L (ref 15–41)
Albumin: 2.9 g/dL — ABNORMAL LOW (ref 3.5–5.0)
Alkaline Phosphatase: 66 U/L (ref 38–126)
Anion gap: 9 (ref 5–15)
BUN: 21 mg/dL (ref 8–23)
CO2: 26 mmol/L (ref 22–32)
Calcium: 8.7 mg/dL — ABNORMAL LOW (ref 8.9–10.3)
Chloride: 99 mmol/L (ref 98–111)
Creatinine, Ser: 0.69 mg/dL (ref 0.44–1.00)
GFR, Estimated: >60 mL/min
Glucose, Bld: 97 mg/dL (ref 70–99)
Potassium: 4.2 mmol/L (ref 3.5–5.1)
Sodium: 134 mmol/L — ABNORMAL LOW (ref 135–145)
Total Bilirubin: 0.8 mg/dL (ref 0.0–1.2)
Total Protein: 6.2 g/dL — ABNORMAL LOW (ref 6.5–8.1)

## 2024-02-17 LAB — CBC
HCT: 23.4 % — ABNORMAL LOW (ref 36.0–46.0)
Hemoglobin: 7.6 g/dL — ABNORMAL LOW (ref 12.0–15.0)
MCH: 26.7 pg (ref 26.0–34.0)
MCHC: 32.5 g/dL (ref 30.0–36.0)
MCV: 82.1 fL (ref 80.0–100.0)
Platelets: 292 10*3/uL (ref 150–400)
RBC: 2.85 MIL/uL — ABNORMAL LOW (ref 3.87–5.11)
RDW: 16.9 % — ABNORMAL HIGH (ref 11.5–15.5)
WBC: 9.2 10*3/uL (ref 4.0–10.5)
nRBC: 0.2 % (ref 0.0–0.2)

## 2024-02-17 MED ORDER — ACETAMINOPHEN 500 MG PO TABS
1000.0000 mg | ORAL_TABLET | Freq: Three times a day (TID) | ORAL | Status: DC
  Administered 2024-02-17 – 2024-02-22 (×15): 1000 mg via ORAL
  Filled 2024-02-17 (×15): qty 2

## 2024-02-17 MED ORDER — CALCIUM CARBONATE ANTACID 500 MG PO CHEW
1.0000 | CHEWABLE_TABLET | Freq: Three times a day (TID) | ORAL | Status: DC | PRN
  Administered 2024-02-17 – 2024-02-21 (×3): 200 mg via ORAL
  Filled 2024-02-17 (×3): qty 1

## 2024-02-17 MED ORDER — OXYCODONE HCL 5 MG PO TABS
5.0000 mg | ORAL_TABLET | ORAL | Status: DC | PRN
  Administered 2024-02-17 – 2024-02-22 (×11): 5 mg via ORAL
  Filled 2024-02-17 (×12): qty 1

## 2024-02-17 MED ORDER — SIMETHICONE 80 MG PO CHEW
80.0000 mg | CHEWABLE_TABLET | Freq: Four times a day (QID) | ORAL | Status: DC | PRN
  Administered 2024-02-17 (×2): 80 mg via ORAL
  Filled 2024-02-17 (×2): qty 1

## 2024-02-17 NOTE — Care Management Obs Status (Signed)
 MEDICARE OBSERVATION STATUS NOTIFICATION   Patient Details  Name: Bridget Cortez MRN: [id number] Date of Birth: 12/27/1945   Medicare Observation Status Notification Given:  Yes    Sonda Manuella Quill, RN 02/17/2024, 10:04 AM

## 2024-02-17 NOTE — TOC Initial Note (Signed)
 Transition of Care [facility]) - Initial/Assessment Note    Patient Details  Name: Bridget Cortez MRN: [id number] Date of Birth: 15-Apr-1945  [facility] CM/SW Contact:    Sonda Manuella Quill, RN Phone Number: 02/17/2024, 10:12 AM  Clinical Narrative:                 IP CM for d/c planning; pt from [facility]; spoke w/ her over phone; pt said  pans to return to SNF and then home w/ support from her spouse Toneshia Coello [phone]); transportation TBD; insurance/PCP verified; pt denied SDOH risks; pt has cane, walker, wheelchair, and BSC;  pt said she previously had HH services from [facility]; she does not have home oxygen ; pt agreed to SNF; she does not want to return to [facility]; explained SNF process, and ins auth needed; pt verbalized understanding; her facility preference is [facility]; awaiting PT eval; IP CM is following.  Expected Discharge Plan: Skilled Nursing Facility Barriers to Discharge: Continued Medical Work up   Patient Goals and CMS Choice Patient states their goals for this hospitalization and ongoing recovery are:: short-term rehab     [place] ownership interest in [facility].provided to:: Patient    Expected Discharge Plan and Services   Discharge Planning Services: CM Consult   Living arrangements for the past 2 months: Skilled Nursing Facility [facility])                 DME Arranged: N/A DME Agency: NA       HH Arranged: NA          Prior Living Arrangements/Services Living arrangements for the past 2 months: Skilled Nursing Facility Teacher, Adult Education) Lives with:: Spouse Patient language and need for interpreter reviewed:: Yes Do you feel safe going back to the place where you live?: Yes      Need for Family Participation in Patient Care: Yes (Comment)   Current home services: DME (cane, walker, wheelchair, BSC) Criminal Activity/Legal Involvement Pertinent to Current Situation/Hospitalization: No - Comment as  needed  Activities of Daily Living   ADL Screening (condition at time of admission) Independently performs ADLs?: Yes (appropriate for developmental age) Is the patient deaf or have difficulty hearing?: No Does the patient have difficulty seeing, even when wearing glasses/contacts?: No Does the patient have difficulty concentrating, remembering, or making decisions?: No  Permission Sought/Granted Permission sought to share information with : Case Manager Permission granted to share information with : Yes, Verbal Permission Granted  Share Information with NAME: Case Manager     Permission granted to share info w Relationship: Ambreen Tufte (spouse) [phone]     Emotional Assessment Appearance::  (unable to assess) Attitude/Demeanor/Rapport: Gracious Affect (typically observed): Accepting Orientation: : Oriented to Self, Oriented to Place, Oriented to  Time, Oriented to Situation Alcohol  / Substance Use: Not Applicable Psych Involvement: No (comment)  Admission diagnosis:  Low hemoglobin [D64.9] Symptomatic anemia [D64.9] Left knee pain, unspecified chronicity [M25.562] Patient Active Problem List   Diagnosis Date Noted   Symptomatic anemia 02/16/2024   Constipation 02/16/2024   Knee pain 02/16/2024   Primary osteoarthritis of left knee 02/11/2024   Anemia 12/07/2023   PAF (paroxysmal atrial fibrillation) (HCC)/Flutter 10/04/2022   Leukocytopenia 09/15/2020   Deficiency anemia 09/15/2020   Persistent atrial fibrillation (HCC)    OA (osteoarthritis) of knee 09/09/2019   (HFpEF) heart failure with preserved ejection fraction (HCC) 04/13/2019   SVT (supraventricular tachycardia) 08/04/2017   Wide-complex tachycardia 08/03/2017   Asthma 08/06/2014  Anxiety about health 08/06/2014   Essential hypertension 08/06/2014   History of colonic polyps 03/20/2012   Chiari malformation    Heart murmur    H/O varicella    Yeast infection    Cancer (HCC)    H/O osteopenia     Abnormal Pap smear    Vulvar intraepithelial neoplasia III (VIN III) 04/12/2009   Atrophy of vagina 06/23/2005   PCP:  Avva, Ravisankar, MD Pharmacy:   [facility] - [place], [place] - [address] [place] [address] Phone: [phone] Fax: [phone]     Social Drivers of Health (SDOH) Social History: SDOH Screenings   Food Insecurity: No Food Insecurity (02/17/2024)  Housing: Low Risk (02/17/2024)  Recent Concern: Housing - Medium Risk (01/21/2024)   Received from [facility]  Transportation Needs: No Transportation Needs (02/17/2024)  Utilities: Not At Risk (02/17/2024)  Depression (PHQ2-9): Low Risk (12/07/2023)  Social Connections: Moderately Integrated (02/16/2024)  Tobacco Use: Medium Risk (02/17/2024)   SDOH Interventions: Food Insecurity Interventions: Intervention Not Indicated, Inpatient TOC Housing Interventions: Intervention Not Indicated, Inpatient TOC Transportation Interventions: Intervention Not Indicated, Inpatient TOC Utilities Interventions: Intervention Not Indicated, Inpatient TOC   Readmission Risk Interventions     No data to display

## 2024-02-17 NOTE — Progress Notes (Signed)
" °  Progress Note   Patient: Bridget Cortez FMW:[id number] DOB: [birth year] DOA: 02/16/2024     0 DOS: the patient was seen and examined on 02/17/2024    Brief hospital course: 79 y o woman with PMH of A Fib on Xarelto , HTN, SVT, HFpEF, GERD, osteoarthritis with recent left knee replacement on 02/11/2024 who presented with left knee pain and bleeding from surgical site.  Patient found to be anemic.  Orthopedics consulted.  Patient admitted for further monitoring of her blood counts.  Xarelto  on hold.  Assessment and Plan:  Acute blood loss anemia Likely due to bleeding from surgical site in the setting of anticoagulation.  -Hold DOAC, and plan to resume when Hgb stabilizes.  - Monitor hemoglobin. - Orthopedics following.  Recent left knee arthroplasty - Orthopedics following. - Continue pain management. - Continue ice packs.  Hypertension On amlodipine , Nebivolol , telmisartan, torsemide  at home. - Continue home Nebivolol . - Holding amlodipine , telmisartan, torsemide  given relatively low pressures.  Atrial fibrillation on Xarelto  CHA2DS2-VASc score is 5. - Hold Xarelto . - Continue home Nebivolol .  SVT - Continue home Nebivolol .  HFpEF Not in exacerbation. - Continue Nebivolol . - Holding home telmisartan and torsemide  for now given low blood pressures.  Constipation - Stool softeners and laxatives as needed.       Subjective: Patient complains of left knee pain.  Also says she feels gassy.  Physical Exam: BP 101/80 (BP Location: Right Arm)   Pulse 84   Temp 99.9 F (37.7 C) (Oral)   Resp 18   Ht 5' 7 (1.702 m)   Wt 110.8 kg   SpO2 99%   BMI 38.26 kg/m    General: Alert, oriented X3  Eyes: Pupils equal, reactive  Oral cavity: moist mucous membranes  Head: Atraumatic, normocephalic  Neck: supple  Chest: clear to auscultation. No crackles, no wheezes  CVS: S1,S2 RRR. No murmurs  Abd: No distention, soft, non-tender. No masses palpable  MSK: Bruising  around left knee Neurological: Grossly intact.    Data Reviewed:    Latest Ref Rng & Units 02/17/2024    3:21 AM 02/16/2024    5:10 PM 02/16/2024   10:30 AM  CBC  WBC 4.0 - 10.5 K/uL 9.2   10.5   Hemoglobin 12.0 - 15.0 g/dL 7.6  8.2  8.1   Hematocrit 36.0 - 46.0 % 23.4  25.4  25.2   Platelets 150 - 400 K/uL 292   284       Latest Ref Rng & Units 02/17/2024    3:21 AM 02/16/2024   10:30 AM 02/12/2024    3:44 AM  BMP  Glucose 70 - 99 mg/dL 97  96  857   BUN 8 - 23 mg/dL 21  16  10    Creatinine 0.44 - 1.00 mg/dL 9.30  9.29  9.23   Sodium 135 - 145 mmol/L 134  134  138   Potassium 3.5 - 5.1 mmol/L 4.2  4.2  4.0   Chloride 98 - 111 mmol/L 99  98  103   CO2 22 - 32 mmol/L 26  27  24    Calcium  8.9 - 10.3 mg/dL 8.7  9.0  8.6     Family Communication: n/a  Disposition: Status is: Observation  DVT ppx: SCDs      Author: MDALA-GAUSI, Chane Magner AGATHA, MD 02/17/2024 2:56 PM  For on call review [email or web address].    "

## 2024-02-17 NOTE — Evaluation (Signed)
 Physical Therapy Evaluation Patient Details Name: Bridget Cortez MRN: [id number] DOB: [birth year] Today's Date: 02/17/2024  History of Present Illness  Pt [facility] on 02/16[facility]2023 after just having L TKA on 02/11/2024. Pt reports feeling  weak, stomach pain and low hemaglobin with symptomic anemia. PMH: Pt s/p L TKR on 02/11/24 and with hx of R TKR, PAF and Chiari malformation  Clinical Impression  Pt very tearful and worried that she will not get better. Pt reports great amount of pain in stomach as well ans B LE with very little ability to touch the LLE. Over time I was able to work more with B LE and get some movement going, however lots of pain, swelling, redness, bruising, hematoma visible, as well as heat makign it more difficult to move. Worked with tissue mobilization, B LE exercises, and glut sets to help mobility. Pt really wants to try , but also has lots of pain and fear. Was able to sit edge of bad for about 10 minutes, and attempted to stand several times with no success with even lift offs. Pt is still a +2 assist for all mobility at this time. Concerned with LLE and stomach pain.Nurse aware. Will continue to follow while here, Patient will benefit from continued inpatient follow up therapy, <3 hours/day.         If plan is discharge home, recommend the following: Two people to help with walking and/or transfers;Assistance with cooking/housework;Help with stairs or ramp for entrance;Assist for transportation;Two people to help with bathing/dressing/bathroom   Can travel by private vehicle   No    Equipment Recommendations None recommended by PT  Recommendations for Other Services       Functional Status Assessment Patient has had a recent decline in their functional status and demonstrates the ability to make significant improvements in function in a reasonable and predictable amount of time.     Precautions / Restrictions Precautions Precautions:  Knee;Fall Restrictions Weight Bearing Restrictions Per Provider Order: No LLE Weight Bearing Per Provider Order: Weight bearing as tolerated      Mobility  Bed Mobility Overal bed mobility: Needs Assistance Bed Mobility: Supine to Sit     Supine to sit: Max assist, HOB elevated, Used rails (great time needed and encouragment, and use of bed pads to assist scootng hips.) Sit to supine: Max assist, +2 for physical assistance   General bed mobility comments: Increased time and significant cues to get to EOB requiring assist for bil LE , trunk , and scooting forward.  Pt leaning onto R elbow with difficulty coming upright expressing fear of falling.  REquiring significantly increased time and reassurance that she is stable on bed and just needs to push up to sit.  Assist for trunk and legs back to bed ( very simlar to what she was doing before she DC in her last hospitalization)    Transfers Overall transfer level: Needs assistance (attempted 2 lift off with no success , using bed pad and 2 person.)                      Ambulation/Gait                  Stairs            Wheelchair Mobility     Tilt Bed    Modified Rankin (Stroke Patients Only)       Balance Overall balance assessment: Needs assistance Sitting-balance support: Feet supported, Single extremity  supported Sitting balance-Leahy Scale: Fair Sitting balance - Comments: Pt was able to sit EOB aftger I worked with her for about 5 minutes to get her to relax and feel comfortable due to her fear of falling. She was then able to sit for another 5 mnutes unsupported with both feet finally resting on teh floor.       Standing balance comment: unable to stand today                             Pertinent Vitals/Pain Pain Assessment Faces Pain Scale: Hurts whole lot Pain Location: L knee with movement and any touch. Her R LE is also ainful to touch but not as much as the L Pain  Descriptors / Indicators: Grimacing, Guarding, Sharp Pain Intervention(s): Limited activity within patient's tolerance, Monitored during session, Premedicated before session, Repositioned, Ice applied, Utilized relaxation techniques    Home Living Family/patient expects to be discharged to:: Skilled nursing facility Living Arrangements: Spouse/significant other Available Help at Discharge: Family;Available 24 hours/day Type of Home: House Home Access: Stairs to enter Entrance Stairs-Rails: Can reach both;Left;Right Entrance Stairs-Number of Steps: 3   Home Layout: Able to live on main level with bedroom/bathroom;Two level Home Equipment: Agricultural Consultant (2 wheels);Shower seat;Cane - single point      Prior Function Prior Level of Function : Independent/Modified Independent             Mobility Comments: Using RW vs cane; limited distance, this was prior to the L TKA on 02/11/2024. Pt reports that at the [facility] she was only doing bed exercises and not getting up ADLs Comments: independent     Extremity/Trunk Assessment        Lower Extremity Assessment Lower Extremity Assessment: LLE deficits/detail;RLE deficits/detail RLE Deficits / Details: knee flexion to 90 degrees when seated, but not able to when supine. supine pt unable to do any LE movment on her own ( no knee flexion , sliding ab/adduction SLR, etc) so 2/5 for strength LLE Deficits / Details: Knee flex limited . in supine I was not able to get her knee to bend except about 20 degrees. However after time sitting edge of bed,she was finally able to get it to around 50 degrees knee flexion resting foot flat on floor. no active AROM and very limntied quad set with great encoragment .great swelling and hematoma, brusing noted, increased heat all around knee and in calf . LLE: Unable to fully assess due to pain    Cervical / Trunk Assessment Cervical / Trunk Assessment: Kyphotic  Communication   Communication Communication:  No apparent difficulties    Cognition Arousal: Alert Behavior During Therapy: Anxious   PT - Cognitive impairments: No apparent impairments                       PT - Cognition Comments: Fearful of pain and falling; Some confusion/forgetfullness in regards to events during session and difficulty with motor planning/sequencing of exercises but then also recalls names throughout session.   Following commands impaired: Follows one step commands with increased time     Cueing Cueing Techniques: Verbal cues, Tactile cues     General Comments      Exercises Total Joint Exercises Ankle Circles/Pumps: AROM, Both, 20 reps, Supine Quad Sets: AROM, Both, Supine, Limitations, 10 reps Quad Sets Limitations: weak contraction on L , and great encourage adn cues due to pain on R as well,  and great amount of pain on L Heel Slides: AAROM, Both, 20 reps, Supine, Limitations Heel Slides Limitations: limited motion on R LE and very limited motion on L. Hip ABduction/ADduction: AAROM, 10 reps, Both, Supine (patients ability increased when exercises partnered with counting aloud and breathing exercises) Straight Leg Raises: AAROM, Both, 10 reps, Supine Long Arc Quad: AROM Goniometric ROM: super miinted movement supine AAROM 0-30, but sitting passive was able to settle foot flat on floor LLE and about 50 degree flexion.   Assessment/Plan    PT Assessment Patient needs continued PT services  PT Problem List Decreased strength;Decreased range of motion;Decreased activity tolerance;Decreased balance;Decreased mobility;Decreased knowledge of use of DME;Obesity;Pain       PT Treatment Interventions DME instruction;Gait training;Stair training;Functional mobility training;Therapeutic activities;Therapeutic exercise;Patient/family education;Balance training    PT Goals (Current goals can be found in the Care Plan section)  Acute Rehab PT Goals Patient Stated Goal: I want to be able to walk  again PT Goal Formulation: With patient Time For Goal Achievement: 03/02/24 Potential to Achieve Goals: Fair    Frequency Min 4X/week     Co-evaluation               AM-PAC PT 6 Clicks Mobility  Outcome Measure Help needed turning from your back to your side while in a flat bed without using bedrails?: A Lot Help needed moving from lying on your back to sitting on the side of a flat bed without using bedrails?: Total Help needed moving to and from a bed to a chair (including a wheelchair)?: Total Help needed standing up from a chair using your arms (e.g., wheelchair or bedside chair)?: Total Help needed to walk in hospital room?: Total Help needed climbing 3-5 steps with a railing? : Total 6 Click Score: 7    End of Session Equipment Utilized During Treatment: Gait belt Activity Tolerance: Patient limited by pain;Patient limited by fatigue;Other (comment) Patient left: in bed;with call bell/phone within reach;with bed alarm set;with nursing/sitter in room Nurse Communication: Mobility status PT Visit Diagnosis: Difficulty in walking, not elsewhere classified (R26.2);Muscle weakness (generalized) (M62.81)    Time: 1700-1750 PT Time Calculation (min) (ACUTE ONLY): 50 min   Charges:   PT Evaluation $PT Eval Low Complexity: 1 Low PT Treatments $Therapeutic Activity: 23-37 mins PT General Charges $$ ACUTE PT VISIT: 1 Visit         Bridget Cortez, PT, MPT Acute Rehabilitation Services Office: [phone] If a weekend: secure chat groups: [facility] PT, [facility] OT, [facility] SLP 02/17/2024   Bridget Cortez 02/17/2024, 7:25 PM

## 2024-02-18 DIAGNOSIS — D649 Anemia, unspecified: Secondary | ICD-10-CM | POA: Diagnosis not present

## 2024-02-18 LAB — CBC
HCT: 22.9 % — ABNORMAL LOW (ref 36.0–46.0)
Hemoglobin: 7.4 g/dL — ABNORMAL LOW (ref 12.0–15.0)
MCH: 26.2 pg (ref 26.0–34.0)
MCHC: 32.3 g/dL (ref 30.0–36.0)
MCV: 81.2 fL (ref 80.0–100.0)
Platelets: 329 10*3/uL (ref 150–400)
RBC: 2.82 MIL/uL — ABNORMAL LOW (ref 3.87–5.11)
RDW: 16.8 % — ABNORMAL HIGH (ref 11.5–15.5)
WBC: 7.3 10*3/uL (ref 4.0–10.5)
nRBC: 0.6 % — ABNORMAL HIGH (ref 0.0–0.2)

## 2024-02-18 MED ORDER — SIMETHICONE 80 MG PO CHEW
80.0000 mg | CHEWABLE_TABLET | Freq: Four times a day (QID) | ORAL | Status: AC
  Administered 2024-02-18 – 2024-02-19 (×8): 80 mg via ORAL
  Filled 2024-02-18 (×8): qty 1

## 2024-02-18 NOTE — Plan of Care (Signed)

## 2024-02-18 NOTE — Progress Notes (Signed)
 " Progress Note   Patient: Bridget Cortez FMW:[id number] DOB: 1945-05-04 DOA: 02/16/2024     0 DOS: the patient was seen and examined on 02/18/2024    Brief hospital course: 79 y o woman with PMH of A Fib on Xarelto , HTN, SVT, HFpEF, GERD, osteoarthritis with recent left knee replacement on 02/11/2024 who presented with left knee pain and bleeding from surgical site.  Patient found to be anemic.  Orthopedics consulted.  Patient admitted for further monitoring of her blood counts.  Xarelto  on hold.  Assessment and Plan:  Acute blood loss anemia Likely due to bleeding from surgical site in the setting of anticoagulation.  -Hold DOAC, and plan to resume when Hgb stabilizes.  - Monitor hemoglobin. - Orthopedics following.  Recent left knee arthroplasty - Orthopedics following. - Continue pain management. - Continue ice packs.  Abdominal discomfort Patient complains of gas pain. No evidence of obstruction. No vomiting, obstipation. -Continue simethicone  -Will schedule.  Hypertension On amlodipine , Nebivolol , telmisartan, torsemide  at home. - Continue home Nebivolol . - Holding amlodipine , telmisartan, torsemide  given relatively low pressures.  Atrial fibrillation on Xarelto  CHA2DS2-VASc score is 5. - Hold Xarelto . - Continue home Nebivolol .  SVT - Continue home Nebivolol .  HFpEF Not in exacerbation. - Continue Nebivolol . - Holding home telmisartan and torsemide  for now given low blood pressures.        Subjective: Patient states her left knee pain is little better.  Her chief complaint today is abdominal pain which she says is relieved by passing gas.  She feels very bloated.  She has had a bowel movement today.  Physical Exam: BP 125/80 (BP Location: Right Arm)   Pulse (!) 110   Temp 97.9 F (36.6 C) (Oral)   Resp 16   Ht 5' 7 (1.702 m)   Wt 110.8 kg   SpO2 98%   BMI 38.26 kg/m    General: Alert, oriented X3  Eyes: Pupils equal, reactive  Oral  cavity: moist mucous membranes  Head: Atraumatic, normocephalic  Neck: supple  Chest: clear to auscultation. No crackles, no wheezes  CVS: S1,S2 RRR. No murmurs  Abd: No distention, soft, non-tender. No masses palpable.  Normal bowel sounds MSK: Bruising around left knee Neurological: Grossly intact.    Data Reviewed:    Latest Ref Rng & Units 02/18/2024    3:08 AM 02/17/2024    3:21 AM 02/16/2024    5:10 PM  CBC  WBC 4.0 - 10.5 K/uL 7.3  9.2    Hemoglobin 12.0 - 15.0 g/dL 7.4  7.6  8.2   Hematocrit 36.0 - 46.0 % 22.9  23.4  25.4   Platelets 150 - 400 K/uL 329  292        Latest Ref Rng & Units 02/17/2024    3:21 AM 02/16/2024   10:30 AM 02/12/2024    3:44 AM  BMP  Glucose 70 - 99 mg/dL 97  96  857   BUN 8 - 23 mg/dL 21  16  10    Creatinine 0.44 - 1.00 mg/dL 9.30  9.29  9.23   Sodium 135 - 145 mmol/L 134  134  138   Potassium 3.5 - 5.1 mmol/L 4.2  4.2  4.0   Chloride 98 - 111 mmol/L 99  98  103   CO2 22 - 32 mmol/L 26  27  24    Calcium  8.9 - 10.3 mg/dL 8.7  9.0  8.6     Family Communication: n/a  Disposition: Status is: Observation  DVT  ppx: SCDs      Author: MDALA-GAUSI, Maecyn Panning AGATHA, MD 02/18/2024 12:15 PM  For on call review [email or web address]    "

## 2024-02-18 NOTE — [place] FL2 (Signed)
 " [place]  MEDICAID FL2 LEVEL OF CARE FORM     IDENTIFICATION  Patient Name: Bridget Cortez Birthdate: 09/24/1945 Sex: female Admission Date (Current Location): 02/16/2024  [facility] and [place] Number:  Producer, Television/film/video and Address:  [facility],  [address], [place] [address]      Provider Number: [id number]  Attending Physician Name and Address:  Mdala-Gausi, Masiku Agat*  Relative Name and Phone Number:  Anjel, Perfetti (Spouse), Emergency Contact [phone]    Current Level of Care: Hospital Recommended Level of Care: Skilled Nursing Facility Prior Approval Number:    Date Approved/Denied:   PASRR Number: [id number]  Discharge Plan: SNF    Current Diagnoses: Patient Active Problem List   Diagnosis Date Noted   Symptomatic anemia 02/16/2024   Constipation 02/16/2024   Knee pain 02/16/2024   Primary osteoarthritis of left knee 02/11/2024   Anemia 12/07/2023   PAF (paroxysmal atrial fibrillation) (HCC)/Flutter 10/04/2022   Leukocytopenia 09/15/2020   Deficiency anemia 09/15/2020   Persistent atrial fibrillation (HCC)    OA (osteoarthritis) of knee 09/09/2019   (HFpEF) heart failure with preserved ejection fraction (HCC) 04/13/2019   SVT (supraventricular tachycardia) 08/04/2017   Wide-complex tachycardia 08/03/2017   Asthma 08/06/2014   Anxiety about health 08/06/2014   Essential hypertension 08/06/2014   History of colonic polyps 03/20/2012   Chiari malformation    Heart murmur    H/O varicella    Yeast infection    Cancer (HCC)    H/O osteopenia    Abnormal Pap smear    Vulvar intraepithelial neoplasia III (VIN III) 04/12/2009   Atrophy of vagina 06/23/2005    Orientation RESPIRATION BLADDER Height & Weight     Self, Time, Situation, Place  Normal Incontinent Weight: 244 lb 4.3 oz (110.8 kg) Height:  5' 7 (170.2 cm)  BEHAVIORAL SYMPTOMS/MOOD NEUROLOGICAL BOWEL NUTRITION STATUS      Incontinent Diet (Heart Healthy)   AMBULATORY STATUS COMMUNICATION OF NEEDS Skin   Extensive Assist Verbally Surgical wounds (Surgical Closed Surgical Incision Knee Left)                       Personal Care Assistance Level of Assistance  Bathing, Feeding, Dressing Bathing Assistance: Limited assistance Feeding assistance: Limited assistance Dressing Assistance: Limited assistance     Functional Limitations Info  Sight, Hearing, Speech Sight Info: Impaired Hearing Info: Adequate Speech Info: Adequate    SPECIAL CARE FACTORS FREQUENCY  PT (By licensed PT), OT (By licensed OT)     PT Frequency: 5x per week OT Frequency: 5x per week            Contractures Contractures Info: Not present    Additional Factors Info  Code Status, Allergies Code Status Info: FULL Allergies Info: Ephedrine , Cortisone, Fluticasone  Propionate Psychotropic Info: N/A         Current Medications (02/18/2024):  This is the current hospital active medication list Current Facility-Administered Medications  Medication Dose Route Frequency Provider Last Rate Last Admin   acetaminophen  (TYLENOL ) tablet 1,000 mg  1,000 mg Oral TID BM Mdala-Gausi, Masiku Agatha, MD   1,000 mg at 02/18/24 0920   albuterol  (PROVENTIL ) (2.5 MG/3ML) 0.083% nebulizer solution 2.5 mg  2.5 mg Nebulization Q6H PRN Celinda Alm Lot, MD   2.5 mg at 02/17/24 2250   bisacodyl  (DULCOLAX) suppository 10 mg  10 mg Rectal Daily PRN Celinda Alm Lot, MD   10 mg at 02/18/24 0225   calcium  carbonate (TUMS - dosed in  mg elemental calcium ) chewable tablet 200 mg of elemental calcium   1 tablet Oral TID PRN Mdala-Gausi, Masiku Agatha, MD   200 mg of elemental calcium  at 02/17/24 2250   methocarbamol  (ROBAXIN ) tablet 500 mg  500 mg Oral Q6H PRN Celinda Alm Lot, MD   500 mg at 02/17/24 2305   nebivolol  (BYSTOLIC ) tablet 5 mg  5 mg Oral Daily Celinda Alm Lot, MD   5 mg at 02/18/24 9074   ondansetron  (ZOFRAN ) tablet 4 mg  4 mg Oral Q6H PRN Celinda Alm Lot, MD        Or   ondansetron  (ZOFRAN ) injection 4 mg  4 mg Intravenous Q6H PRN Celinda Alm Lot, MD       oxyCODONE  (Oxy IR/ROXICODONE ) immediate release tablet 5 mg  5 mg Oral Q4H PRN Mdala-Gausi, Masiku Agatha, MD   5 mg at 02/18/24 0921   polyethylene glycol (MIRALAX  / GLYCOLAX ) packet 17 g  17 g Oral Daily Celinda Alm Lot, MD   17 g at 02/17/24 9073   simethicone  (MYLICON) chewable tablet 80 mg  80 mg Oral QID Mdala-Gausi, Masiku Agatha, MD   80 mg at 02/18/24 0925   sorbitol, magnesium hydroxide, mineral oil, glycerin (SMOG) enema  400 mL Rectal PRN Celinda Alm Lot, MD         Discharge Medications: Please see discharge summary for a list of discharge medications.  Relevant Imaging Results:  Relevant Lab Results:   Additional Information SSN: [id number]  Heather LABOR Golden Emile, LCSW     "

## 2024-02-18 NOTE — TOC Progression Note (Signed)
 Transition of Care [facility]) - Progression Note    Patient Details  Name: Bridget Cortez MRN: [id number] Date of Birth: 12/03/1945  [facility]) CM/SW Contact  Heather DELENA Saltness, LCSW Phone Number: 02/18/2024, 1:44 PM  Clinical Narrative:    PT recommending SNF rehab. Pt agreeable, preferred facility is [facility]. Pt also requesting not to return to [place]. CSW completed FL2 and sent referrals to facilities in [place] and surrounding areas. Currently awaiting bed offers. TOC will continue to follow.   Expected Discharge Plan: Skilled Nursing Facility Barriers to Discharge: Continued Medical Work up   Expected Discharge Plan and Services   Discharge Planning Services: CM Consult   Living arrangements for the past 2 months: Skilled Nursing Facility [facility])                 DME Arranged: N/A DME Agency: NA       HH Arranged: NA           Social Drivers of Health (SDOH) Interventions SDOH Screenings   Food Insecurity: No Food Insecurity (02/17/2024)  Housing: Low Risk (02/17/2024)  Recent Concern: Housing - Medium Risk (01/21/2024)   Received from [facility]  Transportation Needs: No Transportation Needs (02/17/2024)  Utilities: Not At Risk (02/17/2024)  Depression (PHQ2-9): Low Risk (12/07/2023)  Social Connections: Moderately Integrated (02/16/2024)  Tobacco Use: Medium Risk (02/17/2024)    Readmission Risk Interventions     No data to display         Signed: Heather Saltness, MSW, LCSW Clinical Social Worker Inpatient Care Management 02/18/2024 1:46 PM

## 2024-02-19 DIAGNOSIS — D649 Anemia, unspecified: Secondary | ICD-10-CM | POA: Diagnosis not present

## 2024-02-19 LAB — CBC
HCT: 21.5 % — ABNORMAL LOW (ref 36.0–46.0)
Hemoglobin: 7.3 g/dL — ABNORMAL LOW (ref 12.0–15.0)
MCH: 27 pg (ref 26.0–34.0)
MCHC: 34 g/dL (ref 30.0–36.0)
MCV: 79.6 fL — ABNORMAL LOW (ref 80.0–100.0)
Platelets: 376 10*3/uL (ref 150–400)
RBC: 2.7 MIL/uL — ABNORMAL LOW (ref 3.87–5.11)
RDW: 16.8 % — ABNORMAL HIGH (ref 11.5–15.5)
WBC: 6.9 10*3/uL (ref 4.0–10.5)
nRBC: 0.6 % — ABNORMAL HIGH (ref 0.0–0.2)

## 2024-02-19 NOTE — Progress Notes (Signed)
"   Subjective: Bridget Cortez continues to be reluctant to move and ambulate. I have re-emphasized that she will not get better or achieve her goals for recovery if she is not an active participant in her recovery  She has no specific complaints about her knee    Objective: Vital signs in last 24 hours: Temp:  [98.3 F (36.8 C)-100 F (37.8 C)] 100 F (37.8 C) (01/27 0516) Pulse Rate:  [72-103] 72 (01/27 0516) Resp:  [16-18] 16 (01/27 0516) BP: (97-118)/(67-81) 118/67 (01/27 0516) SpO2:  [98 %-100 %] 100 % (01/27 0516)  Intake/Output from previous day: 01/26 0701 - 01/27 0700 In: 480 [P.O.:480] Out: 650 [Urine:650] Intake/Output this shift: No intake/output data recorded.  Recent Labs    02/16/24 1030 02/16/24 1710 02/17/24 0321 02/18/24 0308  HGB 8.1* 8.2* 7.6* 7.4*   Recent Labs    02/17/24 0321 02/18/24 0308  WBC 9.2 7.3  RBC 2.85* 2.82*  HCT 23.4* 22.9*  PLT 292 329   Recent Labs    02/16/24 1030 02/17/24 0321  NA 134* 134*  K 4.2 4.2  CL 98 99  CO2 27 26  BUN 16 21  CREATININE 0.70 0.69  GLUCOSE 96 97  CALCIUM  9.0 8.7*   Recent Labs    02/16/24 1030  INR 1.4*    Neurologically intact Neurovascular intact Incision: dressing C/D/I No cellulitis present Compartment soft No significant swelling about the knee    Assessment/Plan: Resume PT Await SNF Await CBC results from today. She was anemic pre-op  with a Hb of 10 so the current level is not a tremendous decrease from her baseline    Bridget Cortez 02/19/2024, 8:14 AM      "

## 2024-02-19 NOTE — Progress Notes (Signed)
 " Progress Note   Patient: Bridget Cortez FMW:[id number] DOB: 10-18-1945 DOA: 02/16/2024     0 DOS: the patient was seen and examined on 02/19/2024    Brief hospital course: 79 y o woman with PMH of A Fib on Xarelto , HTN, SVT, HFpEF, GERD, osteoarthritis with recent left knee replacement on 02/11/2024 who presented with left knee pain and bleeding from surgical site.  Patient found to be anemic.  Orthopedics consulted.  Patient admitted for further monitoring of her blood counts.  Xarelto  on hold.  Assessment and Plan:  Acute blood loss anemia Likely due to bleeding from surgical site in the setting of anticoagulation.  -Hold DOAC, and plan to resume when Hgb stabilizes, possibly 1/28.  - Will plan to monitor Hgb for 24 to 48 hrs after resumption of Xarelto  and then discharge to SNF if Hgb stable.  - Monitor hemoglobin. - Orthopedics following.  Low-grade fever No infectious source identified. - Duplex of lower extremities to rule out DVT, as patient has been off blood thinners.  Recent left knee arthroplasty - Orthopedics following. - Continue pain management. - Continue ice packs.  Abdominal discomfort Patient complains of gas pain. No evidence of obstruction. No vomiting, obstipation. -Continue scheduled simethicone .  Hypertension On amlodipine , Nebivolol , telmisartan, torsemide  at home. - Continue home Nebivolol . - Holding amlodipine , telmisartan, torsemide  given relatively low pressures.  Atrial fibrillation on Xarelto  CHA2DS2-VASc score is 5. - Hold Xarelto . - Continue home Nebivolol .  SVT - Continue home Nebivolol .  HFpEF Not in exacerbation. - Continue Nebivolol . - Holding home telmisartan and torsemide  for now given low blood pressures.        Subjective: Patient is feeling a lot better today.  Her abdominal pain is improving. Low-grade fevers noted.  Patient denies headache, sore throat, runny nose, cough.  No diarrhea, nausea or vomiting.  She  is having regular bowel movements.  No dysuria or myalgia.  Physical Exam: BP 117/71 (BP Location: Right Arm)   Pulse 88   Temp 100 F (37.8 C) (Oral)   Resp 16   Ht 5' 7 (1.702 m)   Wt 110.8 kg   SpO2 100%   BMI 38.26 kg/m    General: Alert, oriented X3  Eyes: Pupils equal, reactive  Oral cavity: moist mucous membranes  Head: Atraumatic, normocephalic  Neck: supple  Chest: clear to auscultation. No crackles, no wheezes  CVS: S1,S2 RRR. No murmurs  Abd: No distention, soft, non-tender. No masses palpable.  Normal bowel sounds MSK: Bruising of left lower extremity Neurological: Grossly intact.    Data Reviewed:    Latest Ref Rng & Units 02/19/2024   10:24 AM 02/18/2024    3:08 AM 02/17/2024    3:21 AM  CBC  WBC 4.0 - 10.5 K/uL 6.9  7.3  9.2   Hemoglobin 12.0 - 15.0 g/dL 7.3  7.4  7.6   Hematocrit 36.0 - 46.0 % 21.5  22.9  23.4   Platelets 150 - 400 K/uL 376  329  292       Latest Ref Rng & Units 02/17/2024    3:21 AM 02/16/2024   10:30 AM 02/12/2024    3:44 AM  BMP  Glucose 70 - 99 mg/dL 97  96  857   BUN 8 - 23 mg/dL 21  16  10    Creatinine 0.44 - 1.00 mg/dL 9.30  9.29  9.23   Sodium 135 - 145 mmol/L 134  134  138   Potassium 3.5 - 5.1 mmol/L 4.2  4.2  4.0   Chloride 98 - 111 mmol/L 99  98  103   CO2 22 - 32 mmol/L 26  27  24    Calcium  8.9 - 10.3 mg/dL 8.7  9.0  8.6     Family Communication: n/a  Disposition: Status is: Observation  DVT ppx: SCDs      Author: MDALA-GAUSI, GOLDEN PILLOW, MD 02/19/2024 3:18 PM  For on call review [email or web address].    "

## 2024-02-19 NOTE — Progress Notes (Signed)
 Physical Therapy Treatment Patient Details Name: Bridget Cortez MRN: [id number] DOB: [birth year] Today's Date: 02/19/2024   History of Present Illness Pt returned from [facility] on 02/16/2023 after just having L TKA on 02/11/2024. Pt reports feeling  weak, stomach pain and low hemaglobin with symptomic anemia. PMH: Pt s/p L TKR on 02/11/24 and with hx of R TKR, PAF and Chiari malformation    PT Comments  Pt resting in bed, requires encouragement to participate in session and demonstrates increased anxiety, self-limiting and fear-avoidance behaviors while attempting bed mobility and sit<>stand transfers with MAX-Total A+2. Pt teary-eyed and emotional throughout session requiring continued emotional support and encouragement to participate. Educated pt on nature of surgery, recovery, importance of mobility and pain management. Pt verbalizes being motivated and wanting to be able to walk again, however follows up statement with self-doubt and wanting to give up. She will continue to benefit from skilled therapy intervention to address her deficits, and follow up therapy at SNF for <3hrs/day.     If plan is discharge home, recommend the following: Two people to help with walking and/or transfers;Assistance with cooking/housework;Help with stairs or ramp for entrance;Assist for transportation;Two people to help with bathing/dressing/bathroom   Can travel by private vehicle     No  Equipment Recommendations  None recommended by PT    Recommendations for Other Services       Precautions / Restrictions Precautions Precautions: Knee;Fall Recall of Precautions/Restrictions: Intact Restrictions Weight Bearing Restrictions Per Provider Order: Yes LLE Weight Bearing Per Provider Order: Weight bearing as tolerated     Mobility  Bed Mobility Overal bed mobility: Needs Assistance Bed Mobility: Supine to Sit, Sit to Supine     Supine to sit: Max assist, +2 for physical assistance, +2 for  safety/equipment, HOB elevated, Used rails, Total assist Sit to supine: +2 for physical assistance, +2 for safety/equipment, HOB elevated, Used rails, Max assist, Total assist   General bed mobility comments: MAX A-TOTAL +2 with use of draw sheet to complete sit<>supine, pt limited with assistance secondary to anxiety and fear-avoidant behaviors secondary to pain and history of falls. Continual multimodal cues and encouragement to aprticipate required, pt tearful throughout session    Transfers Overall transfer level: Needs assistance (unable to achieve full standing position) Equipment used: Rolling walker (2 wheels) Transfers: Sit to/from Stand Sit to Stand: Max assist, Total assist, +2 safety/equipment, +2 physical assistance, From elevated surface           General transfer comment: attempted x2 from elevated bed surfaces at max-total A +2, able to clear hips from bed surface on second attempt however unable to achieve full standing position. On first attempt, pt actively resisting assistance with heavy posterior lean. Requires assistance to maintain proper positioning of RLE to help power to stand, continual VC for sequencing and hand placement, and continued emotional support.    Ambulation/Gait Ambulation/Gait assistance:  (unsafe to attempt as patient is unable to achieve standing with +2 assistance)                 Stairs             Wheelchair Mobility     Tilt Bed    Modified Rankin (Stroke Patients Only)       Balance Overall balance assessment: Needs assistance Sitting-balance support: Feet supported, Single extremity supported Sitting balance-Leahy Scale: Poor Sitting balance - Comments: heave right lateral and posterior lean needing MOD-TOTAL A to maintain sitting balance, balance deficits appear to be connected  to pt's increased anxiety with mobility and elevated pain levels, when attentive to task and able to calm self, pt able to sit at [facility]    Standing balance support: Reliant on assistive device for balance, Bilateral upper extremity supported Standing balance-Leahy Scale: Zero Standing balance comment: unable to achieve full stand todau wit RW and max-total +2                            Communication Communication Communication: No apparent difficulties  Cognition Arousal: Alert Behavior During Therapy: Anxious   PT - Cognitive impairments: No apparent impairments                       PT - Cognition Comments: tearful throughout session with increased self doubt regarding current level of function, limited progress and pain with all mobility. Anxiety and tearfulness can limit session, requiring frequent redirection and continual emotional support and encouragement Following commands: Impaired Following commands impaired: Follows one step commands with increased time    Cueing Cueing Techniques: Verbal cues, Tactile cues  Exercises      General Comments        Pertinent Vitals/Pain Pain Assessment Pain Assessment: Faces Faces Pain Scale: Hurts whole lot Pain Location: LLE with movement and touch Pain Descriptors / Indicators: Grimacing, Guarding, Sharp, Shooting, Crying Pain Intervention(s): Limited activity within patient's tolerance, Monitored during session, Repositioned    Home Living                          Prior Function            PT Goals (current goals can now be found in the care plan section) Acute Rehab PT Goals Patient Stated Goal: I want to be able to walk again PT Goal Formulation: With patient Time For Goal Achievement: 03/02/24 Potential to Achieve Goals: Fair Progress towards PT goals: Not progressing toward goals - comment (+2 for all mobility, anxiety with all movement, fear avoidant with standing, self-limiting)    Frequency    Min 2X/week      PT Plan      Co-evaluation              AM-PAC PT 6 Clicks Mobility   Outcome Measure   Help needed turning from your back to your side while in a flat bed without using bedrails?: Total Help needed moving from lying on your back to sitting on the side of a flat bed without using bedrails?: Total Help needed moving to and from a bed to a chair (including a wheelchair)?: Total Help needed standing up from a chair using your arms (e.g., wheelchair or bedside chair)?: Total Help needed to walk in hospital room?: Total Help needed climbing 3-5 steps with a railing? : Total 6 Click Score: 6    End of Session Equipment Utilized During Treatment: Gait belt Activity Tolerance: Patient limited by fatigue;Patient limited by pain;Other (comment) (anxiety, fear-avoidant behaviors regarding all mobility, self limiting secondary to anxiety and self-doubt) Patient left: in bed;with call bell/phone within reach;with bed alarm set;with nursing/sitter in room Nurse Communication: Mobility status PT Visit Diagnosis: Difficulty in walking, not elsewhere classified (R26.2);Muscle weakness (generalized) (M62.81)     Time: 8546-8476 PT Time Calculation (min) (ACUTE ONLY): 30 min  Charges:    $Therapeutic Activity: 23-37 mins  Isaiah DEL. Kier Smead, PT, DPT   [employer] 02/19/2024, 3:39 PM

## 2024-02-19 NOTE — TOC Progression Note (Signed)
 Transition of Care [facility]) - Progression Note    Patient Details  Name: Bridget Cortez MRN: [id number] Date of Birth: Apr 14, 1945  Transition [facility]) CM/SW Contact  NORMAN ASPEN, LCSW Phone Number: 02/19/2024, 2:33 PM  Clinical Narrative:     Have secured SNF bed with [facility] and facility can admit pt tomorrow if medically cleared for dc.  Pt and spouse aware/ agreeable.  Have alerted MD.  Expected Discharge Plan: Skilled Nursing Facility Barriers to Discharge: Continued Medical Work up               Expected Discharge Plan and Services   Discharge Planning Services: CM Consult   Living arrangements for the past 2 months: Skilled Nursing Facility [facility]                 DME Arranged: N/A DME Agency: NA       HH Arranged: NA           Social Drivers of Health (SDOH) Interventions SDOH Screenings   Food Insecurity: No Food Insecurity (02/17/2024)  Housing: Low Risk (02/17/2024)  Recent Concern: Housing - Medium Risk (01/21/2024)   Received from [facility]  Transportation Needs: No Transportation Needs (02/17/2024)  Utilities: Not At Risk (02/17/2024)  Depression (PHQ2-9): Low Risk (12/07/2023)  Social Connections: Moderately Integrated (02/16/2024)  Tobacco Use: Medium Risk (02/17/2024)    Readmission Risk Interventions     No data to display

## 2024-02-20 ENCOUNTER — Observation Stay (HOSPITAL_BASED_OUTPATIENT_CLINIC_OR_DEPARTMENT_OTHER)

## 2024-02-20 DIAGNOSIS — M7989 Other specified soft tissue disorders: Secondary | ICD-10-CM | POA: Diagnosis not present

## 2024-02-20 LAB — BASIC METABOLIC PANEL WITH GFR
Anion gap: 10 (ref 5–15)
BUN: 13 mg/dL (ref 8–23)
CO2: 25 mmol/L (ref 22–32)
Calcium: 8.6 mg/dL — ABNORMAL LOW (ref 8.9–10.3)
Chloride: 97 mmol/L — ABNORMAL LOW (ref 98–111)
Creatinine, Ser: 0.56 mg/dL (ref 0.44–1.00)
GFR, Estimated: >60 mL/min
Glucose, Bld: 137 mg/dL — ABNORMAL HIGH (ref 70–99)
Potassium: 3.7 mmol/L (ref 3.5–5.1)
Sodium: 132 mmol/L — ABNORMAL LOW (ref 135–145)

## 2024-02-20 LAB — CBC
HCT: 23.4 % — ABNORMAL LOW (ref 36.0–46.0)
Hemoglobin: 7.3 g/dL — ABNORMAL LOW (ref 12.0–15.0)
MCH: 25.9 pg — ABNORMAL LOW (ref 26.0–34.0)
MCHC: 31.2 g/dL (ref 30.0–36.0)
MCV: 83 fL (ref 80.0–100.0)
Platelets: 399 10*3/uL (ref 150–400)
RBC: 2.82 MIL/uL — ABNORMAL LOW (ref 3.87–5.11)
RDW: 17 % — ABNORMAL HIGH (ref 11.5–15.5)
WBC: 6.5 10*3/uL (ref 4.0–10.5)
nRBC: 0.6 % — ABNORMAL HIGH (ref 0.0–0.2)

## 2024-02-20 MED ORDER — ALBUTEROL SULFATE (2.5 MG/3ML) 0.083% IN NEBU
2.5000 mg | INHALATION_SOLUTION | Freq: Once | RESPIRATORY_TRACT | Status: DC
  Filled 2024-02-20: qty 3

## 2024-02-20 MED ORDER — MELATONIN 3 MG PO TABS
3.0000 mg | ORAL_TABLET | Freq: Every evening | ORAL | Status: DC | PRN
  Administered 2024-02-21: 3 mg via ORAL
  Filled 2024-02-20 (×2): qty 1

## 2024-02-20 MED ORDER — RIVAROXABAN 10 MG PO TABS
20.0000 mg | ORAL_TABLET | Freq: Every day | ORAL | Status: DC
  Administered 2024-02-20 – 2024-02-21 (×2): 20 mg via ORAL
  Filled 2024-02-20 (×2): qty 2

## 2024-02-20 NOTE — Progress Notes (Addendum)
 " Progress Note   Patient: Bridget Cortez FMW:[id number] DOB: 04/09/1945 DOA: 02/16/2024     0 DOS: the patient was seen and examined on 02/20/2024    Brief hospital course: 79 y o woman with PMH of A Fib on Xarelto , HTN, SVT, HFpEF, GERD, osteoarthritis with recent left knee replacement on 02/11/2024 who presented with left knee pain and bleeding from surgical site.  Patient found to be anemic.  Orthopedics consulted.  Patient admitted for further monitoring of her blood counts.  Xarelto  on hold.  Assessment and Plan:  Acute blood loss anemia Likely due to bleeding from surgical site in the setting of anticoagulation.  Hemoglobin monitored while holding DOAC, and stabilized on 1/28. -Will resume DOAC today, 1/28 - Will plan to monitor Hgb for 24 to 48 hrs after resumption of Xarelto  and then discharge to SNF if Hgb stable.  - Monitor hemoglobin. - Orthopedics following.  Low-grade fever No infectious source identified. - Duplex of lower extremities to rule out DVT, as patient has been off blood thinners.  Recent left knee arthroplasty Likely hemarthrosis as well as bruising of left lower extremity suggesting some bleeding into tissues.  Now stable. - Orthopedics following. - Continue pain management. - Continue ice packs. -Continue PT  Abdominal discomfort Patient complains of gas pain. No evidence of obstruction. No vomiting, obstipation. -Continue scheduled simethicone .  Hypertension On amlodipine , Nebivolol , telmisartan, torsemide  at home. - Continue home Nebivolol . - Holding amlodipine , telmisartan, torsemide  given relatively low pressures.  Atrial fibrillation on Xarelto  CHA2DS2-VASc score is 5. Home Xarelto  held due to blood loss, possible hemarthrosis - Continue home Nebivolol . - Resuming Xarelto  1/28.  SVT - Continue home Nebivolol .  HFpEF Not in exacerbation. - Continue Nebivolol . - Holding home telmisartan and torsemide  for now given low blood  pressures.        Subjective: Patient is doing better.  Working with network engineer.  Physical Exam: BP 100/70 (BP Location: Right Arm)   Pulse 100   Temp 98 F (36.7 C) (Oral)   Resp 16   Ht 5' 7 (1.702 m)   Wt 110.8 kg   SpO2 100%   BMI 38.26 kg/m    General: Alert, oriented X3  Eyes: Pupils equal, reactive  Oral cavity: moist mucous membranes  Head: Atraumatic, normocephalic  Neck: supple  Chest: clear to auscultation. No crackles, no wheezes  CVS: S1,S2 RRR. No murmurs  Abd: No distention, soft, non-tender. No masses palpable.  Normal bowel sounds MSK: Bruising of left lower extremity Neurological: Grossly intact.    Data Reviewed:    Latest Ref Rng & Units 02/20/2024   10:01 AM 02/19/2024   10:24 AM 02/18/2024    3:08 AM  CBC  WBC 4.0 - 10.5 K/uL 6.5  6.9  7.3   Hemoglobin 12.0 - 15.0 g/dL 7.3  7.3  7.4   Hematocrit 36.0 - 46.0 % 23.4  21.5  22.9   Platelets 150 - 400 K/uL 399  376  329       Latest Ref Rng & Units 02/20/2024   10:01 AM 02/17/2024    3:21 AM 02/16/2024   10:30 AM  BMP  Glucose 70 - 99 mg/dL 862  97  96   BUN 8 - 23 mg/dL 13  21  16    Creatinine 0.44 - 1.00 mg/dL 9.43  9.30  9.29   Sodium 135 - 145 mmol/L 132  134  134   Potassium 3.5 - 5.1 mmol/L 3.7  4.2  4.2  Chloride 98 - 111 mmol/L 97  99  98   CO2 22 - 32 mmol/L 25  26  27    Calcium  8.9 - 10.3 mg/dL 8.6  8.7  9.0     Family Communication: n/a  Disposition: Status is: Observation  DVT ppx: SCDs, Xarelto       Author: MDALA-GAUSI, Bridget Cortez AGATHA, MD 02/20/2024 11:16 AM  For on call review [email or web address]    "

## 2024-02-20 NOTE — Progress Notes (Signed)
 Mobility Specialist - Progress Note:   02/20/24 1407  Mobility  Activity  (Bed Exercises)  Range of Motion/Exercises Active;Active Assistive  Activity Response Tolerated fair  Mobility Referral Yes  Mobility visit 1 Mobility  Mobility Specialist Start Time (ACUTE ONLY) 1300  Mobility Specialist Stop Time (ACUTE ONLY) 1315  Mobility Specialist Time Calculation (min) (ACUTE ONLY) 15 min   Pt was received in bed and states being motivated to mobility; opted for additional exercises: Seated BLE Exercises: 10 reps each  1) Toe Raise/ Heel Raise  2) Ankle Pumps Seated ULE Exercises: 10 reps each, Yellow Resistance band  1) Elbow Flexion  2) Elbow Extension  Pt had no complaints/issues during session. Returned to bed with all needs met. Call bell in reach.   [employer] - Mobility Specialist - Acute Rehabilitation Can be reached via [employer]

## 2024-02-20 NOTE — Progress Notes (Signed)
 BLE venous duplex has been completed.   Results can be found under chart review under CV PROC. 02/20/2024 1:31 PM Jenny Lai RVT, RDMS

## 2024-02-20 NOTE — Plan of Care (Signed)
   Problem: Nutrition: Goal: Adequate nutrition will be maintained Outcome: Progressing   Problem: Pain Managment: Goal: General experience of comfort will improve and/or be controlled Outcome: Progressing   Problem: Safety: Goal: Ability to remain free from injury will improve Outcome: Progressing

## 2024-02-20 NOTE — Progress Notes (Signed)
 Mobility Specialist - Progress Note:   02/20/24 1028  Mobility  Activity  (Bed Exercises)  Range of Motion/Exercises Active Assistive;Active  Activity Response Tolerated well  Mobility Referral Yes  Mobility visit 1 Mobility  Mobility Specialist Start Time (ACUTE ONLY) 0930  Mobility Specialist Stop Time (ACUTE ONLY) 0945  Mobility Specialist Time Calculation (min) (ACUTE ONLY) 15 min   Pt was received in bed and motivated to exercise: Seated BLE Exercises:  1) Toe Raise/ Heel Raise: 1 x 10 (active assistive)  2) Ankle Pumps: 1 x 10  3) Hip Adduction (pillow squeezes): 1 x 10  Pt had no complaints/issues during session. Returned to bed with all needs met. Call bell in reach.  [employer] - Mobility Specialist - Acute Rehabilitation Can be reached via [employer]

## 2024-02-21 DIAGNOSIS — D649 Anemia, unspecified: Secondary | ICD-10-CM | POA: Diagnosis not present

## 2024-02-21 LAB — CBC
HCT: 22.2 % — ABNORMAL LOW (ref 36.0–46.0)
Hemoglobin: 7.1 g/dL — ABNORMAL LOW (ref 12.0–15.0)
MCH: 26.2 pg (ref 26.0–34.0)
MCHC: 32 g/dL (ref 30.0–36.0)
MCV: 81.9 fL (ref 80.0–100.0)
Platelets: 384 10*3/uL (ref 150–400)
RBC: 2.71 MIL/uL — ABNORMAL LOW (ref 3.87–5.11)
RDW: 17.2 % — ABNORMAL HIGH (ref 11.5–15.5)
WBC: 7.3 10*3/uL (ref 4.0–10.5)
nRBC: 0.4 % — ABNORMAL HIGH (ref 0.0–0.2)

## 2024-02-21 MED ORDER — ALBUTEROL SULFATE HFA 108 (90 BASE) MCG/ACT IN AERS: 2.0000 | INHALATION_SPRAY | RESPIRATORY_TRACT | Status: DC | PRN

## 2024-02-21 NOTE — Progress Notes (Signed)
 Patient requesting neb treatment. Patient with no s/s of respiratory distress noted at this time. RT called to the floor and the patient refused the neb tx and now requesting HFA per RT. Will continue to monitor the patient.

## 2024-02-21 NOTE — Progress Notes (Signed)
 Physical Therapy Treatment Patient Details Name: Bridget Cortez MRN: [id number] DOB: [birth year] Today's Date: 02/21/2024   History of Present Illness Pt returned from [facility] on 02/16/2023 after just having L TKA on 02/11/2024. Pt reports feeling  weak, stomach pain and low hemaglobin with symptomic anemia. PMH: Pt s/p L TKR on 02/11/24 and with hx of R TKR, PAF and Chiari malformation    PT Comments  AxO x 3 sweet Lady.  Very anxious/nervous and worried about hurting her knee more.  Max encouragement.  High anxiety when assisted to seated EOB.  Required increased time and positive reinforcement. Assisted to EOB was difficult.  Sit to Stand: Max assist, Total assist, +2 safety/equipment, +2 physical assistance, From elevated surface General transfer comment: first assisted from elevated bed to [facility] 1/4 turn required Max Assist + 2 with great difficulty weight shifting to her R LE (recent TKR)  with increased pain and HIGH ANXIETY.  Then assisted off BSC was increasingly difficult.  Pt was unable to clear hips from lower surface lavel despite several attempts.  Limited by L knee pain, weakness and body habitus.  Used STEDY with + 3 assist to rise from [facility].  Assisted with peri care after BM, t6hen quickly swithched BSC with recliner from behind Pt as she stood long enough. Positioned in rec liner to comfort.  Place MAXI MOVE PAD under Pt.  Rec LIFT back to bed. LPT has rec Pt will need ST Rehab at SNF to address mobility and functional decline prior to safely returning home.    If plan is discharge home, recommend the following: Two people to help with walking and/or transfers;Assistance with cooking/housework;Help with stairs or ramp for entrance;Assist for transportation;Two people to help with bathing/dressing/bathroom   Can travel by private vehicle     No  Equipment Recommendations       Recommendations for Other Services       Precautions / Restrictions  Precautions Precaution/Restrictions Comments: no pillow under knee Restrictions Weight Bearing Restrictions Per Provider Order: No LLE Weight Bearing Per Provider Order: Weight bearing as tolerated     Mobility  Bed Mobility   Bed Mobility: Supine to Sit           General bed mobility comments: Max Assist to    Transfers Overall transfer level: Needs assistance Equipment used: Rolling walker (2 wheels) Transfers: Sit to/from Stand Sit to Stand: Max assist, Total assist, +2 safety/equipment, +2 physical assistance, From elevated surface Stand pivot transfers: Max assist, Total assist, +2 physical assistance, +2 safety/equipment         General transfer comment: first assisted from elevated bed to [facility] 1/4 turn required Max Assist + 2 with great difficulty weight shifting to her R LE (recent TKR)  with increased pain and HIGH ANXIETY.  Then assisted off BSC was increasingly difficult.  Pt was unable to clear hips from lower surface lavel despite several attempts.  Limited by L knee pain, weakness and body habitus.  Used STEDY with + 3 assist to rise from [facility].  Assisted with peri care after BM, t6hen quickly swithched BSC with recliner from behind Pt as she stood long enough.    Ambulation/Gait               General Gait Details: transfer only this week   Stairs             Wheelchair Mobility     Tilt Bed    Modified Rankin (Stroke Patients Only)  Balance                                            Communication Communication Communication: No apparent difficulties  Cognition Arousal: Alert Behavior During Therapy: Anxious   PT - Cognitive impairments: No apparent impairments                       PT - Cognition Comments: AxO x 3 sweet Lady.  Very anxious/nervous and worried about hurting her knee more.  Max encouragement.  High anxiety when assisted to seated EOB.  Required increased time and positive  reinforcement. Following commands: Intact Following commands impaired: Follows one step commands with increased time    Cueing Cueing Techniques: Verbal cues  Exercises      General Comments        Pertinent Vitals/Pain Pain Assessment Pain Assessment: Faces Faces Pain Scale: Hurts whole lot Pain Location: L knee with activity and flexion Pain Descriptors / Indicators: Grimacing, Guarding, Sharp, Shooting, Crying Pain Intervention(s): Monitored during session, Premedicated before session, Repositioned    Home Living                          Prior Function            PT Goals (current goals can now be found in the care plan section) Progress towards PT goals: Progressing toward goals    Frequency    Min 2X/week      PT Plan      Co-evaluation              AM-PAC PT 6 Clicks Mobility   Outcome Measure  Help needed turning from your back to your side while in a flat bed without using bedrails?: Total Help needed moving from lying on your back to sitting on the side of a flat bed without using bedrails?: Total Help needed moving to and from a bed to a chair (including a wheelchair)?: Total Help needed standing up from a chair using your arms (e.g., wheelchair or bedside chair)?: Total Help needed to walk in hospital room?: Total Help needed climbing 3-5 steps with a railing? : Total 6 Click Score: 6    End of Session Equipment Utilized During Treatment: Gait belt Activity Tolerance: Patient limited by fatigue;Patient limited by pain;Other (comment) (body habitus) Patient left: in chair;with call bell/phone within reach Nurse Communication: Mobility status;Need for lift equipment PT Visit Diagnosis: Difficulty in walking, not elsewhere classified (R26.2);Muscle weakness (generalized) (M62.81)     Time: 8979-8945 PT Time Calculation (min) (ACUTE ONLY): 34 min  Charges:    $Therapeutic Activity: 23-37 mins PT General Charges $$ ACUTE PT  VISIT: 1 Visit                     {Shellee Streng  PTA Acute  Rehabilitation Services Office M-F          [phone]

## 2024-02-21 NOTE — Progress Notes (Addendum)
" ° °  Subjective:    Patient reports pain as mild.   Patient seen in rounds for Dr. Melodi. Patient is doing fair this morning. Reports gas pains. Pain well controlled currently.   Objective: Vital signs in last 24 hours: Temp:  [97.6 F (36.4 C)-98.6 F (37 C)] 98.6 F (37 C) (01/29 0609) Pulse Rate:  [77-100] 77 (01/29 0609) Resp:  [16-20] 20 (01/29 0609) BP: (100-116)/(61-82) 116/82 (01/29 0609) SpO2:  [100 %] 100 % (01/29 0609)  Intake/Output from previous day:  Intake/Output Summary (Last 24 hours) at 02/21/2024 0809 Last data filed at 02/21/2024 0619 Gross per 24 hour  Intake 1040 ml  Output 1750 ml  Net -710 ml    Intake/Output this shift: No intake/output data recorded.  Labs: Recent Labs    02/19/24 1024 02/20/24 1001 02/21/24 0329  HGB 7.3* 7.3* 7.1*   Recent Labs    02/20/24 1001 02/21/24 0329  WBC 6.5 7.3  RBC 2.82* 2.71*  HCT 23.4* 22.2*  PLT 399 384   Recent Labs    02/20/24 1001  NA 132*  K 3.7  CL 97*  CO2 25  BUN 13  CREATININE 0.56  GLUCOSE 137*  CALCIUM  8.6*   No results for input(s): LABPT, INR in the last 72 hours.  Exam: General - Patient is Alert and Oriented Extremity - Neurologically intact Neurovascular intact Sensation intact distally Dorsiflexion/Plantar flexion intact Dressing/Incision - clean, dry, no drainage Motor Function - intact, moving foot and toes well on exam.   Past Medical History:  Diagnosis Date   Abnormal Pap smear 2006   vaginal medicine according to patient   Allergy    Arthritis    Asthma    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 eye    cataract removed.   Chiari malformation    Complication of anesthesia    woke up during left breast surgery age 14   Diverticulosis    Dysrhythmia    SVT   GERD (gastroesophageal reflux disease)    H/O osteopenia    08/2006   H/O varicella    Heart  murmur    Hypertension    Monilial vaginitis 07/2007   Multiple allergies    Ovarian cyst    Personal history of colonic adenomas 03/13/2012   Personal history of radiation therapy 1990   Pneumonia 07/2014   VIN III (vulvar intraepithelial neoplasia III) 03/2009   Yeast infection     Assessment/Plan:    Principal Problem:   Symptomatic anemia Active Problems:   Essential hypertension   SVT (supraventricular tachycardia)   (HFpEF) heart failure with preserved ejection fraction (HCC)   PAF (paroxysmal atrial fibrillation) (HCC)/Flutter   Constipation   Knee pain  Estimated body mass index is 38.26 kg/m as calculated from the following:   Height as of this encounter: 5' 7 (1.702 m).   Weight as of this encounter: 110.8 kg. Up with therapy  DVT Prophylaxis - Xarelto  Weight-bearing as tolerated  No concerns regarding the knee. Disposition per medical team. She is concerned about discharging back to the SNF facility. Discussed with patient that unless her mobility improves dramatically, discharge to home is not a safe option.  Roxie Mess, [name] Orthopedic Surgery [phone] 02/21/2024, 8:09 AM  "

## 2024-02-21 NOTE — Progress Notes (Signed)
 Patient requesting sleep medication. Reached out to On-Call NP Kristian) for order and melatonin 3 mg ordered. Attempted to give the patient the requested sleep medication and she refused the medication. Will continue to monitor the patient.

## 2024-02-21 NOTE — Progress Notes (Signed)
 " Progress Note   Patient: Bridget Cortez FMW:[id number] DOB: October 18, 1945 DOA: 02/16/2024     0 DOS: the patient was seen and examined on 02/21/2024    Brief hospital course: 79 y o woman with PMH of A Fib on Xarelto , HTN, SVT, HFpEF, GERD, osteoarthritis with recent left knee replacement on 02/11/2024 who presented with left knee pain and bleeding from surgical site.  Patient found to be anemic.  Orthopedics consulted.  Patient admitted for further monitoring of her blood counts.  Xarelto  on hold.  Assessment and Plan:  Acute blood loss anemia Likely due to bleeding from surgical site in the setting of anticoagulation.  Xarelto  resumed 1/28, hemoglobin remains stable at 7.1. - Will plan to recheck hemoglobin tomorrow and discharge to SNF if Hgb stable.  - Monitor hemoglobin. - Orthopedics following-> no need for surgical intervention, needs to continue activity.  Low-grade fever No infectious source identified. - Duplex of lower extremities to rule out DVT, as patient has been off blood thinners.  Recent left knee arthroplasty Likely hemarthrosis as well as bruising of left lower extremity suggesting some bleeding into tissues.  Now stable. - Orthopedics following. - Continue pain management. - Continue ice packs. -Continue PT  Abdominal discomfort Patient complains of gas pain. No evidence of obstruction. No vomiting, obstipation. -Continue scheduled simethicone .  Hypertension On amlodipine , Nebivolol , telmisartan, torsemide  at home. - Continue home Nebivolol . - Holding amlodipine , telmisartan, torsemide  given relatively low pressures.  Atrial fibrillation on Xarelto  CHA2DS2-VASc score is 5. Home Xarelto  held due to blood loss, possible hemarthrosis - Continue home Nebivolol . - Resuming Xarelto  1/28.  SVT - Continue home Nebivolol .  HFpEF Not in exacerbation. - Continue Nebivolol . - Holding home telmisartan and torsemide  for now given low blood pressures.     Dispo: SNF bed with [facility].  Will DC tomorrow if hemoglobin  stable      Subjective: Patient say she is feeling better. Swelling L knee is as expected post op. No fever, chills. Feels motivated for therapies to get stronger  Physical Exam: BP 116/82 (BP Location: Right Arm)   Pulse 77   Temp 98.6 F (37 C) (Oral)   Resp 20   Ht 5' 7 (1.702 m)   Wt 110.8 kg   SpO2 100%   BMI 38.26 kg/m    General: Alert, oriented X3  Eyes: Pupils equal, reactive  Oral cavity: moist mucous membranes  Head: Atraumatic, normocephalic  Neck: supple  Chest: clear to auscultation. No crackles, no wheezes  CVS: S1,S2 RRR. No murmurs  Abd: No distention, soft, non-tender. No masses palpable.  Normal bowel sounds MSK: Bruising of left lower extremity. Swelling + Neurological: Grossly intact.    Data Reviewed:    Latest Ref Rng & Units 02/21/2024    3:29 AM 02/20/2024   10:01 AM 02/19/2024   10:24 AM  CBC  WBC 4.0 - 10.5 K/uL 7.3  6.5  6.9   Hemoglobin 12.0 - 15.0 g/dL 7.1  7.3  7.3   Hematocrit 36.0 - 46.0 % 22.2  23.4  21.5   Platelets 150 - 400 K/uL 384  399  376       Latest Ref Rng & Units 02/20/2024   10:01 AM 02/17/2024    3:21 AM 02/16/2024   10:30 AM  BMP  Glucose 70 - 99 mg/dL 862  97  96   BUN 8 - 23 mg/dL 13  21  16    Creatinine 0.44 - 1.00 mg/dL 9.43  9.30  0.70   Sodium 135 - 145 mmol/L 132  134  134   Potassium 3.5 - 5.1 mmol/L 3.7  4.2  4.2   Chloride 98 - 111 mmol/L 97  99  98   CO2 22 - 32 mmol/L 25  26  27    Calcium  8.9 - 10.3 mg/dL 8.6  8.7  9.0     Family Communication: n/a  Disposition: Status is: Observation  DVT ppx: SCDs, Xarelto       Author: Derryl Duval, MD 02/21/2024 8:03 AM  For on call review [email or web address].    "

## 2024-02-22 DIAGNOSIS — D649 Anemia, unspecified: Secondary | ICD-10-CM | POA: Diagnosis not present

## 2024-02-22 LAB — HEMOGLOBIN AND HEMATOCRIT, BLOOD
HCT: 21.7 % — ABNORMAL LOW (ref 36.0–46.0)
Hemoglobin: 7 g/dL — ABNORMAL LOW (ref 12.0–15.0)

## 2024-02-22 MED ORDER — OXYCODONE HCL 5 MG PO TABS: 5.0000 mg | ORAL_TABLET | ORAL | 0 refills | Status: AC | PRN

## 2024-02-22 NOTE — Plan of Care (Signed)
" °  Problem: Clinical Measurements: Goal: Respiratory complications will improve Outcome: Progressing Goal: Cardiovascular complication will be avoided Outcome: Progressing   Problem: Nutrition: Goal: Adequate nutrition will be maintained Outcome: Progressing   Problem: Elimination: Goal: Will not experience complications related to bowel motility Outcome: Progressing Goal: Will not experience complications related to urinary retention Outcome: Progressing   Problem: Pain Managment: Goal: General experience of comfort will improve and/or be controlled Outcome: Progressing   Problem: Safety: Goal: Ability to remain free from injury will improve Outcome: Progressing   Problem: Skin Integrity: Goal: Risk for impaired skin integrity will decrease Outcome: Progressing   "

## 2024-02-22 NOTE — Progress Notes (Signed)
 Called [facility] and report given to nurse Jammie.

## 2024-02-22 NOTE — TOC Transition Note (Signed)
 Transition of Care [facility]) - Discharge Note   Patient Details  Name: Bridget Cortez MRN: [id number] Date of Birth: 10-11-1945  Transition of Care [facility]) CM/SW Contact:  NORMAN ASPEN, LCSW Phone Number: 02/22/2024, 11:56 AM   Clinical Narrative:     Pt has been medically cleared for dc to [facility] today.  Pt and spouse aware and agreeable.  PTAR called at 11:50am.  RN to call report to [phone].  No further IP CM needs.  Final next level of care: Skilled Nursing Facility Barriers to Discharge: Barriers Resolved   Patient Goals and CMS Choice Patient states their goals for this hospitalization and ongoing recovery are:: short-term rehab     [place] ownership interest in [facility].provided to:: Patient    Discharge Placement   Existing PASRR number confirmed : 02/20/24          Patient chooses bed at: [facility] Patient to be transferred to facility by: PTAR Name of family member notified: spouse Patient and family notified of of transfer: 02/22/24  Discharge Plan and Services Additional resources added to the After Visit Summary for     Discharge Planning Services: CM Consult            DME Arranged: N/A DME Agency: NA       HH Arranged: NA          Social Drivers of Health (SDOH) Interventions SDOH Screenings   Food Insecurity: No Food Insecurity (02/17/2024)  Housing: Low Risk (02/17/2024)  Recent Concern: Housing - Medium Risk (01/21/2024)   Received from [facility]  Transportation Needs: No Transportation Needs (02/17/2024)  Utilities: Not At Risk (02/17/2024)  Depression (PHQ2-9): Low Risk (12/07/2023)  Social Connections: Moderately Integrated (02/16/2024)  Tobacco Use: Medium Risk (02/17/2024)     Readmission Risk Interventions     No data to display

## 2024-02-22 NOTE — Plan of Care (Signed)
  Problem: Pain Managment: Goal: General experience of comfort will improve and/or be controlled Outcome: Progressing   Problem: Safety: Goal: Ability to remain free from injury will improve Outcome: Progressing   Problem: Pain Managment: Goal: General experience of comfort will improve and/or be controlled Outcome: Progressing

## 2024-03-26 ENCOUNTER — Other Ambulatory Visit

## 2024-04-01 ENCOUNTER — Inpatient Hospital Stay: Admitting: Internal Medicine

## 2024-04-01 ENCOUNTER — Inpatient Hospital Stay

## 2024-04-02 ENCOUNTER — Ambulatory Visit: Admitting: Internal Medicine

## 2024-04-15 ENCOUNTER — Ambulatory Visit: Admitting: Student in an Organized Health Care Education/Training Program

## 2024-07-04 ENCOUNTER — Encounter (INDEPENDENT_AMBULATORY_CARE_PROVIDER_SITE_OTHER): Admitting: Ophthalmology
